# Patient Record
Sex: Male | Born: 1935 | Race: White | Hispanic: No | Marital: Married | Smoking: Former smoker
Health system: Southern US, Community
[De-identification: ages and names within clinical notes are randomized; demographics above are authoritative.]

## PROBLEM LIST (undated history)

## (undated) DIAGNOSIS — K219 Gastro-esophageal reflux disease without esophagitis: Secondary | ICD-10-CM

## (undated) DIAGNOSIS — I447 Left bundle-branch block, unspecified: Secondary | ICD-10-CM

## (undated) DIAGNOSIS — A498 Other bacterial infections of unspecified site: Secondary | ICD-10-CM

## (undated) DIAGNOSIS — Z87820 Personal history of traumatic brain injury: Secondary | ICD-10-CM

## (undated) DIAGNOSIS — G3184 Mild cognitive impairment, so stated: Secondary | ICD-10-CM

## (undated) DIAGNOSIS — Z95818 Presence of other cardiac implants and grafts: Secondary | ICD-10-CM

## (undated) DIAGNOSIS — R2689 Other abnormalities of gait and mobility: Secondary | ICD-10-CM

## (undated) DIAGNOSIS — E785 Hyperlipidemia, unspecified: Secondary | ICD-10-CM

## (undated) DIAGNOSIS — E7800 Pure hypercholesterolemia, unspecified: Secondary | ICD-10-CM

## (undated) DIAGNOSIS — D494 Neoplasm of unspecified behavior of bladder: Secondary | ICD-10-CM

## (undated) DIAGNOSIS — R9439 Abnormal result of other cardiovascular function study: Secondary | ICD-10-CM

## (undated) DIAGNOSIS — I499 Cardiac arrhythmia, unspecified: Secondary | ICD-10-CM

## (undated) DIAGNOSIS — H3530 Unspecified macular degeneration: Secondary | ICD-10-CM

## (undated) DIAGNOSIS — Z9049 Acquired absence of other specified parts of digestive tract: Secondary | ICD-10-CM

## (undated) DIAGNOSIS — M4802 Spinal stenosis, cervical region: Secondary | ICD-10-CM

## (undated) DIAGNOSIS — Z8711 Personal history of peptic ulcer disease: Secondary | ICD-10-CM

## (undated) HISTORY — DX: Acquired absence of other specified parts of digestive tract: Z90.49

## (undated) HISTORY — DX: Other abnormalities of gait and mobility: R26.89

## (undated) HISTORY — PX: TUMOR EXCISION: SHX421

## (undated) HISTORY — DX: Unspecified macular degeneration: H35.30

## (undated) HISTORY — DX: Mild cognitive impairment of uncertain or unknown etiology: G31.84

## (undated) HISTORY — DX: Pure hypercholesterolemia, unspecified: E78.00

## (undated) HISTORY — DX: Abnormal result of other cardiovascular function study: R94.39

## (undated) HISTORY — PX: NECK SURGERY: SHX720

## (undated) HISTORY — DX: Presence of other cardiac implants and grafts: Z95.818

## (undated) HISTORY — PX: TONSILLECTOMY: SUR1361

---

## 1968-09-23 HISTORY — PX: APPENDECTOMY: SHX54

## 1969-05-24 DIAGNOSIS — Z8711 Personal history of peptic ulcer disease: Secondary | ICD-10-CM

## 1969-05-24 HISTORY — DX: Personal history of peptic ulcer disease: Z87.11

## 1998-12-11 ENCOUNTER — Ambulatory Visit (HOSPITAL_COMMUNITY): Admission: RE | Admit: 1998-12-11 | Discharge: 1998-12-11 | Payer: Self-pay | Admitting: Cardiology

## 1998-12-11 ENCOUNTER — Encounter: Payer: Self-pay | Admitting: Cardiology

## 2002-07-12 ENCOUNTER — Encounter (HOSPITAL_BASED_OUTPATIENT_CLINIC_OR_DEPARTMENT_OTHER): Payer: Self-pay | Admitting: General Surgery

## 2002-07-14 ENCOUNTER — Ambulatory Visit (HOSPITAL_COMMUNITY): Admission: RE | Admit: 2002-07-14 | Discharge: 2002-07-14 | Payer: Self-pay | Admitting: General Surgery

## 2002-07-14 HISTORY — PX: INGUINAL HERNIA REPAIR: SUR1180

## 2002-07-17 ENCOUNTER — Emergency Department (HOSPITAL_COMMUNITY): Admission: EM | Admit: 2002-07-17 | Discharge: 2002-07-17 | Payer: Self-pay | Admitting: Emergency Medicine

## 2004-01-19 ENCOUNTER — Encounter (INDEPENDENT_AMBULATORY_CARE_PROVIDER_SITE_OTHER): Payer: Self-pay | Admitting: *Deleted

## 2004-01-19 ENCOUNTER — Ambulatory Visit (HOSPITAL_COMMUNITY): Admission: RE | Admit: 2004-01-19 | Discharge: 2004-01-19 | Payer: Self-pay | Admitting: Gastroenterology

## 2004-11-20 ENCOUNTER — Encounter: Admission: RE | Admit: 2004-11-20 | Discharge: 2004-11-20 | Payer: Self-pay | Admitting: Family Medicine

## 2005-05-15 ENCOUNTER — Emergency Department (HOSPITAL_COMMUNITY): Admission: EM | Admit: 2005-05-15 | Discharge: 2005-05-15 | Payer: Self-pay | Admitting: Emergency Medicine

## 2005-05-22 ENCOUNTER — Ambulatory Visit (HOSPITAL_COMMUNITY): Admission: RE | Admit: 2005-05-22 | Discharge: 2005-05-22 | Payer: Self-pay | Admitting: Family Medicine

## 2005-06-12 ENCOUNTER — Emergency Department (HOSPITAL_COMMUNITY): Admission: EM | Admit: 2005-06-12 | Discharge: 2005-06-12 | Payer: Self-pay | Admitting: Emergency Medicine

## 2005-12-22 ENCOUNTER — Observation Stay (HOSPITAL_COMMUNITY): Admission: EM | Admit: 2005-12-22 | Discharge: 2005-12-23 | Payer: Self-pay | Admitting: Emergency Medicine

## 2007-06-22 ENCOUNTER — Emergency Department (HOSPITAL_COMMUNITY): Admission: EM | Admit: 2007-06-22 | Discharge: 2007-06-22 | Payer: Self-pay | Admitting: Emergency Medicine

## 2008-02-11 ENCOUNTER — Encounter: Admission: RE | Admit: 2008-02-11 | Discharge: 2008-02-11 | Payer: Self-pay | Admitting: Family Medicine

## 2008-03-10 ENCOUNTER — Ambulatory Visit (HOSPITAL_BASED_OUTPATIENT_CLINIC_OR_DEPARTMENT_OTHER): Admission: RE | Admit: 2008-03-10 | Discharge: 2008-03-10 | Payer: Self-pay | Admitting: Urology

## 2008-03-10 ENCOUNTER — Encounter (INDEPENDENT_AMBULATORY_CARE_PROVIDER_SITE_OTHER): Payer: Self-pay | Admitting: Urology

## 2008-03-10 HISTORY — PX: TRANSURETHRAL RESECTION OF BLADDER TUMOR: SHX2575

## 2009-03-22 HISTORY — PX: CARDIOVASCULAR STRESS TEST: SHX262

## 2010-02-25 ENCOUNTER — Emergency Department (HOSPITAL_BASED_OUTPATIENT_CLINIC_OR_DEPARTMENT_OTHER): Admission: EM | Admit: 2010-02-25 | Discharge: 2010-02-25 | Payer: Self-pay | Admitting: Emergency Medicine

## 2010-09-28 DIAGNOSIS — K2100 Gastro-esophageal reflux disease with esophagitis, without bleeding: Secondary | ICD-10-CM

## 2010-09-28 HISTORY — DX: Gastro-esophageal reflux disease with esophagitis, without bleeding: K21.00

## 2011-02-05 NOTE — Op Note (Signed)
NAME:  Oscar [name], Oscar [name]               ACCOUNT NO.:  [id number]   MEDICAL RECORD NO.:  [id number]          PATIENT TYPE:  AMB   LOCATION:  NESC                         FACILITY:  [facility]   PHYSICIAN:  Valetta Fuller, M.D.  DATE OF BIRTH:  07/07/1931   DATE OF PROCEDURE:  DATE OF DISCHARGE:                               OPERATIVE REPORT   PREOPERATIVE DIAGNOSIS:  Bladder tumor.   POSTOPERATIVE DIAGNOSIS:  Bladder tumor.   PROCEDURE PERFORMED:  Transurethral resection of bladder tumor with deep  cold cup biopsies and fulguration.   SURGEON:  Valetta Fuller, M.D.   ANESTHESIA:  General.   INDICATIONS:  [name] is a 77 year old male.  The patient had some  intermittent painless gross hematuria.  The patient had an ultrasound of  his bladder and kidneys.  On bladder ultrasound there was an  approximately 2 cm bladder mass noted.  We performed cystoscopy in the  office and confirmed an approximately 2 cm tumor.  This abnormality  appeared to be on the right side of his bladder in the upper aspect of  the trigone above and lateral to the ureteral orifice.  The patient now  presents for endoscopic resection of the tumor and assessment.   TECHNIQUE AND FINDINGS:  The patient was brought to the operating room  where he had successful induction of general anesthesia.  He was placed  in lithotomy position and prepped and draped in the usual manner.  Careful cystoscopic assessment revealed a solitary tumor again superior  and lateral to the ureteral orifice about 3-4 cm away from that.  The  tumor had some adherent clot but for the most part was papillary in  appearance.  There were some surrounding red areas right along the area  of the tumor but no other abnormalities within the bladder.   TURBT was performed with continuous flow resectoscope.  The tumor was  resected down to the bladder wall.  No evidence of perforation was  noted.  The bladder tumor fragments were sent as a separate  pathologic  specimen.  We then put the cystoscope back in and used the cold cup to  take several deep biopsies in the underlying muscle.  We felt that would  be more prudent with less artifact than with the transurethral resection  loop.  We then used a Bugbee to further fulgurate the area.  Again, no  other abnormalities were noted and hemostasis was good.  The patient  appeared to tolerate the procedure well without obvious complications or  problems.  Foley catheter was inserted at the end of the procedure and  it drained clear urine.  The patient was brought to the recovery room in  stable condition.           ______________________________  Valetta Fuller, M.D.  Electronically Signed    DSG/MEDQ  D:  03/10/2008  T:  03/10/2008  Job:  [id number]

## 2011-02-08 NOTE — Discharge Summary (Signed)
NAME:  CARLISLE, ENKE               ACCOUNT NO.:  [id number]   MEDICAL RECORD NO.:  [id number]          PATIENT TYPE:  OBV   LOCATION:  5006                         FACILITY:  [facility]   PHYSICIAN:  Corinna L. Lendell Caprice, MDDATE OF BIRTH:  [birth year]   DATE OF ADMISSION:  12/21/2005  DATE OF DISCHARGE:  12/23/2005                                 DISCHARGE SUMMARY   DISCHARGE DIAGNOSES:  1.  Right-sided rib fractures and right L1 transverse process fractures.  2.  Gastroesophageal reflux disease.  3.  Hyperlipidemia.   DISCHARGE MEDICATIONS:  Percocet one to two p.o. q.4h. p.r.n. pain.   ACTIVITY:  No driving for a week or while on Percocet.   FOLLOW-UP:  Dr. Modesto Charon in about two weeks.   CONDITION:  Stable.   CONSULTATIONS:  None.   DIET:  Low cholesterol.   PROCEDURES:  None.   HISTORY AND HOSPITAL COURSE:  Mr. Mangas is a pleasant, quite active 77-  year-old white male patient of Dr. Modesto Charon who presented to the emergency room  after having tripped and fallen on a work bench.  He sustained rib fractures  involving the 10th, 11th, and 12th ribs and L1 transverse process fracture.  He had abdominal and pelvic CAT scan to rule out solid organ injury.  There  was no injury.  He did have some multiple benign or minimally complex cysts  of the liver.  His laboratories were unremarkable.  He was having a lot of  pain, nausea, and difficulty ambulating.  Therefore, he was placed on  observation for pain control and physical therapy to evaluate disposition.  Physical therapy feels he does not need placement at this time and can be  safely discharged home.  He is able to ambulate independently.  He did get  incentive spirometry and DVT prophylaxis while here.      Corinna L. Lendell Caprice, MD  Electronically Signed     CLS/MEDQ  D:  12/23/2005  T:  12/23/2005  Job:  [id number]   cc:   Thelma Barge P. Modesto Charon, M.D.  Fax: [phone]

## 2011-02-08 NOTE — Op Note (Signed)
Oscar [name], Oscar [name]                           ACCOUNT NO.:  [id number]   MEDICAL RECORD NO.:  [id number]                   PATIENT TYPE:  OIB   LOCATION:  2899                                 FACILITY:  [facility]   PHYSICIAN:  Leonie Man, MD                  DATE OF BIRTH:  [birth year]   DATE OF PROCEDURE:  07/14/2002  DATE OF DISCHARGE:                                 OPERATIVE REPORT   PREOPERATIVE DIAGNOSIS:  Right inguinal hernia.   POSTOPERATIVE DIAGNOSIS:  Right direct inguinal hernia.   PROCEDURE:  Right inguinal herniorrhaphy with mesh repair.   SURGEON:  Leonie Man, MD.   ASSISTANT:  Nurse.   ANESTHESIA:  MAC.  I used 0.5% Marcaine with epinephrine 1:200,000.   CLINICAL NOTE:  The patient is a 77 year old man presenting with a right-  sided groin bulge which has enlarged and prolapses easily.  He has not had  any symptoms of incarceration or bowel obstruction.  No history of bladder  neck obstruction, no history of chronic constipation.  He comes to the  operating room now for right inguinal hernia repair after the risks and  potential benefits of surgery have been fully discussed and he gives  consent.   DESCRIPTION OF PROCEDURE:  Following the induction of satisfactory sedation,  the patient's right groin was prepped and draped to be included in the  sterile operative field.  I infiltrated the right lower groin crease with  0.5% Marcaine with epinephrine and made a transverse incision in the lower  on the right, deepened this through the skin and subcutaneous tissue,  dissecting down to the external oblique aponeurosis.  The aponeurosis opened  up through the external inguinal ring with protection of the ilioinguinal  nerve, which was retracted inferiorly and laterally. The spermatic cord is  elevated and held with a Penrose drain, and a large direct hernia sac is  dissected free from the medial aspect of the cord.  A search for an indirect  sac was carried  out through the cord area, and no indirect sac was noted.  The hernia was repaired with an onlay patch of polypropylene mesh sewn to  the pubic tubercle with a 2-0 Novofil and carried up in a running suture  along the conjoined tendon to the internal ring, and again from the pubic  tubercle, along the shelving edge of Poupart's ligament, up to the internal  ring.  The spermatic cord was then returned to its normal anatomic position  with the mesh split and tacked down behind the cord so as to recreate the  internal ring.  Sponge, instrument, and sharp counts were verified and the  external oblique aponeurosis closed over the cord with a running 2-0 Vicryl  suture, Scarpa's fascia and subcutaneous tissue were closed with a running 3-  0 Vicryl suture, and the skin closed with a running 4-0 Monocryl suture and  reinforced with Steri-Strips.  Sterile dressings were applied.  The patient  was removed from the operating room to the recovery room in stable  condition.  He tolerated the procedure well.                                               Leonie Man, MD    PB/MEDQ  D:  07/14/2002  T:  07/14/2002  Job:  [id number]

## 2011-02-08 NOTE — H&P (Signed)
NAME:  Oscar [name], Oscar [name]               ACCOUNT NO.:  [id number]   MEDICAL RECORD NO.:  [id number]          PATIENT TYPE:  EMS   LOCATION:  MAJO                         FACILITY:  [facility]   PHYSICIAN:  Corinna L. Lendell Caprice, MDDATE OF BIRTH:  May 01, 1931   DATE OF ADMISSION:  12/21/2005  DATE OF DISCHARGE:                                HISTORY & PHYSICAL   CHIEF COMPLAINT:  Back pain.   HISTORY OF PRESENT ILLNESS:  Oscar [name] is a pleasant 77 year old white  male patient of Dr. Modesto Charon who presents to the emergency room via EMS after  having tripped and fallen.  He apparently hit his back on a work bench.  He  was diagnosed with rib fractures and apparently is unable to ambulate.  Therefore, I am asked to admit the patient.  Apparently, the trauma team was  consulted but declined admission but recommended primary care admission.  Patient reports that he feels a little nauseated after Dilaudid, but the  Percocet worked well.   PAST MEDICAL HISTORY:  1.  Hyperlipidemia.  2.  Gastroesophageal reflux disease.   MEDICATIONS:  Aspirin, Prilosec, Zocor.   No known drug allergies.   SOCIAL HISTORY:  Patient is married.  He drinks rarely.  He does not smoke.  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   FAMILY HISTORY:  His brother died with an MI and had diabetes.   REVIEW OF SYSTEMS:  As above, otherwise negative.   PHYSICAL EXAMINATION:  VITAL SIGNS:  Temperature 98.1, blood pressure  133/76, pulse 58, respiratory rate 20.  Oxygen saturation 99% on room air.  GENERAL:  Patient is well-developed and well-nourished in no acute distress.  HEENT:  Normocephalic and atraumatic.  Pupils are equal, round and reactive  to light.  Sclerae are anicteric.  Moist mucous membranes.  NECK:  Supple.  No carotid bruits.  LUNGS:  Clear to auscultation bilaterally without rales, rhonchi or wheezes.  CARDIOVASCULAR:  Regular rate and rhythm without murmurs, rubs or gallops.  ABDOMEN:  Normal  bowel sounds.  Nontender, nondistended.  BACK:  He has ecchymoses over his right flank and tenderness posteriorly  over the rib cage.  GU/RECTAL:  Deferred.  EXTREMITIES:  No clubbing, cyanosis or edema.  He does have an abrasion over  his left elbow.  NEUROLOGIC:  Alert and oriented.  Cranial nerves and sensory motor exam are  intact.   H&H normal.  BMET normal.   Right-sided rib films are negative.   CT of the abdomen and pelvis showed benign or minimally complex multiple low  density lesions in the liver.  No solid organ injury.  Parapelvic cysts in  the kidneys.  No free fluid, free air, adenopathy.  Bibasilar atelectasis.  Posterior right rib fractures involving the right 11th and 12th ribs.  Lateral rib fracture on the right involving ribs 10 and 11.  Right, left  transverse process fracture also noted.   ASSESSMENT/PLAN:  1.  Status post trip and fall with resulting right 10th, 11th, and 12th rib      fractures as well as L1 transverse process  fracture.  The patient is      elderly and reports that he cannot walk.  I will put him on 23-hour      observation for PT/OT, pain control, and care management consult.  I      believe he will be able to go back home, possibly with some durable      medical equipment.  2.  Gastroesophageal reflux disease.  3.  Hyperlipidemia.      Corinna L. Lendell Caprice, MD  Electronically Signed     CLS/MEDQ  D:  12/22/2005  T:  12/23/2005  Job:  [id number]   cc:   Thelma Barge P. Modesto Charon, M.D.  Fax: [phone]

## 2011-02-08 NOTE — Op Note (Signed)
NAMEDON, TIU                           ACCOUNT NO.:  [id number]   MEDICAL RECORD NO.:  [id number]                   PATIENT TYPE:  AMB   LOCATION:  ENDO                                 FACILITY:  [facility]   PHYSICIAN:  Danise Edge, M.D.                DATE OF BIRTH:  1931/07/30   DATE OF PROCEDURE:  01/19/2004  DATE OF DISCHARGE:                                 OPERATIVE REPORT   PROCEDURE:  Colonoscopy.   HISTORY:  Oscar [name] is a 77 year old male born September 25, 1928.  Mr.  [name] developed a probable viral gastroenteritis approximately three weeks  ago manifested by nausea, vomiting and nonbloody diarrhea. His CBC was  normal.  To evaluate the four quadrant abdominal discomfort associated with  his viral gastroenteritis type symptoms, he underwent a CT scan of the  abdomen and pelvis on December 06, 2003 which revealed multiple fluid density  nodules in the liver and kidneys, atherosclerosis, and degenerative changes  of the spine but no abnormality of the intestinal tract.  Oscar [name]  nausea and vomiting have resolved but he continues to have functional  nonbloody diarrhea.   MEDICATIONS:  No known drug allergies.   CURRENT MEDICATIONS:  Lipitor, Nexium, glucosamine.   PAST MEDICAL HISTORY:  Hypercholesterolemia, gastroesophageal reflux  disease, arthralgia's, right herniorrhaphy, appendectomy.   HABITS:  Oscar [name] does not smoke cigarettes and does not consume alcohol.   FAMILY HISTORY:  Negative for colon cancer.   ENDOSCOPIST:  Danise Edge, M.D.   PREMEDICATION:  Versed 7 mg, Demerol 70 mg.   DESCRIPTION OF PROCEDURE:  After obtaining informed consent, Oscar [name] was  placed in the left lateral decubitus position. I administered intravenous  Demerol and intravenous Versed to achieve conscious sedation for the  procedure. The patient's blood pressure, oxygen saturation and cardiac  rhythm were monitored throughout the procedure and documented in  the medical  record.   Anal inspection and digital rectal exam were normal.  The prostate was non-  nodular.  The Olympus adjustable pediatric colonoscope was introduced into  the rectum and advanced to the cecum. Colonic preparation for the exam today  was excellent.   RECTUM:  Normal.   SIGMOID COLON AND DESCENDING COLON:  Normal.   SPLENIC FLEXURE:  Normal.   TRANSVERSE COLON:  Normal.   HEPATIC FLEXURE:  Normal.   ASCENDING COLON:  Normal.   CECUM AND ILEOCECAL VALVE:  Normal.   BIOPSIES:  Three biopsies were taken from the right colon and three biopsies  were taken from the descending colon to rule out lymphocytic-collagenous  colitis.   ASSESSMENT:  Normal proctocolonoscopy to the cecum.  Random colonic biopsies  to rule out lymphocytic-collagenous colitis pending.  Danise Edge, M.D.    MJ/MEDQ  D:  01/19/2004  T:  01/19/2004  Job:  [id number]   cc:   Thelma Barge P. Modesto Charon, M.D.  [address]  [place]  [place] [address]  Fax: [phone]

## 2011-06-20 LAB — POCT HEMOGLOBIN-HEMACUE
Hemoglobin: 15.5
Operator id: 114531

## 2011-07-04 LAB — CBC
Hemoglobin: 14.5
MCHC: 33.8
MCV: 93.2
Platelets: 206
RBC: 4.61
RDW: 12.9
WBC: 5.7

## 2011-07-04 LAB — URINALYSIS, ROUTINE W REFLEX MICROSCOPIC
Bilirubin Urine: NEGATIVE
Hgb urine dipstick: NEGATIVE
Ketones, ur: 15 — AB
Nitrite: NEGATIVE
Protein, ur: NEGATIVE
Specific Gravity, Urine: 1.017
Urobilinogen, UA: 0.2
pH: 8.5 — ABNORMAL HIGH

## 2011-07-04 LAB — I-STAT 8, (EC8 V) (CONVERTED LAB)
BUN: 20
Chloride: 109
Glucose, Bld: 109 — ABNORMAL HIGH
Hemoglobin: 15.3
Potassium: 3.8
TCO2: 26
pCO2, Ven: 29.2 — ABNORMAL LOW

## 2011-07-04 LAB — DIFFERENTIAL
Basophils Relative: 0
Eosinophils Absolute: 0.2
Eosinophils Relative: 4
Monocytes Absolute: 0.5
Monocytes Relative: 9
Neutro Abs: 4

## 2011-07-04 LAB — POCT CARDIAC MARKERS
CKMB, poc: 3.6
Myoglobin, poc: 201
Operator id: 285841
Troponin i, poc: <0.05

## 2011-07-04 LAB — POCT I-STAT CREATININE: Operator id: 285841

## 2011-09-27 DIAGNOSIS — T50Z95A Adverse effect of other vaccines and biological substances, initial encounter: Secondary | ICD-10-CM

## 2011-09-27 DIAGNOSIS — T8062XA Other serum reaction due to vaccination, initial encounter: Secondary | ICD-10-CM

## 2011-09-27 HISTORY — DX: Adverse effect of other vaccines and biological substances, initial encounter: T50.Z95A

## 2011-09-27 HISTORY — DX: Other serum reaction due to vaccination, initial encounter: T80.62XA

## 2011-12-09 ENCOUNTER — Other Ambulatory Visit: Payer: Self-pay | Admitting: Surgery

## 2013-02-05 ENCOUNTER — Encounter (HOSPITAL_BASED_OUTPATIENT_CLINIC_OR_DEPARTMENT_OTHER): Payer: Self-pay | Admitting: *Deleted

## 2013-02-05 ENCOUNTER — Other Ambulatory Visit: Payer: Self-pay | Admitting: Urology

## 2013-02-05 NOTE — Progress Notes (Signed)
NPO AFTER MN. ARRIVES AT 0915. NEEDS HG. WILL TAKE PRILOSEC AM OF SURG W/ SIP OF WATER.

## 2013-02-10 ENCOUNTER — Ambulatory Visit (HOSPITAL_BASED_OUTPATIENT_CLINIC_OR_DEPARTMENT_OTHER): Payer: Medicare Other | Admitting: Anesthesiology

## 2013-02-10 ENCOUNTER — Encounter (HOSPITAL_BASED_OUTPATIENT_CLINIC_OR_DEPARTMENT_OTHER): Payer: Self-pay | Admitting: Anesthesiology

## 2013-02-10 ENCOUNTER — Encounter (HOSPITAL_BASED_OUTPATIENT_CLINIC_OR_DEPARTMENT_OTHER)
Admission: RE | Disposition: A | Discharge status: Home / Self Care | Payer: Self-pay | Source: Ambulatory Visit | Attending: Urology

## 2013-02-10 ENCOUNTER — Ambulatory Visit (HOSPITAL_BASED_OUTPATIENT_CLINIC_OR_DEPARTMENT_OTHER)
Admission: RE | Admit: 2013-02-10 | Discharge: 2013-02-10 | Disposition: A | Discharge status: Home / Self Care | Payer: Medicare Other | Source: Ambulatory Visit | Attending: Urology | Admitting: Urology

## 2013-02-10 ENCOUNTER — Encounter (HOSPITAL_BASED_OUTPATIENT_CLINIC_OR_DEPARTMENT_OTHER): Payer: Self-pay

## 2013-02-10 DIAGNOSIS — N401 Enlarged prostate with lower urinary tract symptoms: Secondary | ICD-10-CM | POA: Insufficient documentation

## 2013-02-10 DIAGNOSIS — C679 Malignant neoplasm of bladder, unspecified: Secondary | ICD-10-CM

## 2013-02-10 DIAGNOSIS — Z87891 Personal history of nicotine dependence: Secondary | ICD-10-CM | POA: Insufficient documentation

## 2013-02-10 DIAGNOSIS — Z79899 Other long term (current) drug therapy: Secondary | ICD-10-CM | POA: Insufficient documentation

## 2013-02-10 DIAGNOSIS — C674 Malignant neoplasm of posterior wall of bladder: Secondary | ICD-10-CM | POA: Insufficient documentation

## 2013-02-10 DIAGNOSIS — N138 Other obstructive and reflux uropathy: Secondary | ICD-10-CM | POA: Insufficient documentation

## 2013-02-10 DIAGNOSIS — N139 Obstructive and reflux uropathy, unspecified: Secondary | ICD-10-CM | POA: Insufficient documentation

## 2013-02-10 DIAGNOSIS — Z7982 Long term (current) use of aspirin: Secondary | ICD-10-CM | POA: Insufficient documentation

## 2013-02-10 DIAGNOSIS — K219 Gastro-esophageal reflux disease without esophagitis: Secondary | ICD-10-CM | POA: Insufficient documentation

## 2013-02-10 HISTORY — DX: Gastro-esophageal reflux disease without esophagitis: K21.9

## 2013-02-10 HISTORY — DX: Personal history of traumatic brain injury: Z87.820

## 2013-02-10 HISTORY — DX: Personal history of peptic ulcer disease: Z87.11

## 2013-02-10 HISTORY — DX: Neoplasm of unspecified behavior of bladder: D49.4

## 2013-02-10 HISTORY — DX: Malignant neoplasm of bladder, unspecified: C67.9

## 2013-02-10 HISTORY — DX: Hyperlipidemia, unspecified: E78.5

## 2013-02-10 HISTORY — PX: CYSTOSCOPY WITH BIOPSY: SHX5122

## 2013-02-10 SURGERY — CYSTOSCOPY, WITH BIOPSY
Anesthesia: General | Site: Bladder | Wound class: Clean Contaminated

## 2013-02-10 MED ORDER — LIDOCAINE HCL (CARDIAC) 20 MG/ML IV SOLN
INTRAVENOUS | Status: DC | PRN
  Administered 2013-02-10: 50 mg via INTRAVENOUS

## 2013-02-10 MED ORDER — LACTATED RINGERS IV SOLN
INTRAVENOUS | Status: DC
  Administered 2013-02-10: 10:00:00 via INTRAVENOUS
  Filled 2013-02-10: qty 1000

## 2013-02-10 MED ORDER — FENTANYL CITRATE 0.05 MG/ML IJ SOLN
INTRAMUSCULAR | Status: DC | PRN
  Administered 2013-02-10: 25 ug via INTRAVENOUS
  Administered 2013-02-10: 50 ug via INTRAVENOUS

## 2013-02-10 MED ORDER — PROPOFOL 10 MG/ML IV BOLUS
INTRAVENOUS | Status: DC | PRN
  Administered 2013-02-10: 100 mg via INTRAVENOUS

## 2013-02-10 MED ORDER — DEXAMETHASONE SODIUM PHOSPHATE 4 MG/ML IJ SOLN
INTRAMUSCULAR | Status: DC | PRN
  Administered 2013-02-10: 4 mg via INTRAVENOUS

## 2013-02-10 MED ORDER — STERILE WATER FOR IRRIGATION IR SOLN
Status: DC | PRN
  Administered 2013-02-10: 3000 mL

## 2013-02-10 MED ORDER — LIDOCAINE HCL 2 % EX GEL
CUTANEOUS | Status: DC | PRN
  Administered 2013-02-10: 1 via URETHRAL

## 2013-02-10 MED ORDER — CIPROFLOXACIN HCL 250 MG PO TABS
250.0000 mg | ORAL_TABLET | Freq: Once | ORAL | Status: AC
  Administered 2013-02-10: 250 mg via ORAL
  Filled 2013-02-10: qty 1

## 2013-02-10 MED ORDER — ONDANSETRON HCL 4 MG/2ML IJ SOLN
INTRAMUSCULAR | Status: DC | PRN
  Administered 2013-02-10: 4 mg via INTRAVENOUS

## 2013-02-10 SURGICAL SUPPLY — 21 items
BAG DRAIN URO-CYSTO SKYTR STRL (DRAIN) ×2 IMPLANT
BAG DRN UROCATH (DRAIN) ×1
CANISTER SUCT LVC 12 LTR MEDI- (MISCELLANEOUS) ×1 IMPLANT
CATH ROBINSON RED A/P 14FR (CATHETERS) IMPLANT
CATH ROBINSON RED A/P 16FR (CATHETERS) IMPLANT
CLOTH BEACON ORANGE TIMEOUT ST (SAFETY) ×2 IMPLANT
DRAPE CAMERA CLOSED 9X96 (DRAPES) ×2 IMPLANT
ELECT REM PT RETURN 9FT ADLT (ELECTROSURGICAL) ×2
ELECTRODE REM PT RTRN 9FT ADLT (ELECTROSURGICAL) ×1 IMPLANT
GLOVE BIO SURGEON STRL SZ7 (GLOVE) ×1 IMPLANT
GLOVE BIO SURGEON STRL SZ7.5 (GLOVE) ×2 IMPLANT
GLOVE INDICATOR 7.5 STRL GRN (GLOVE) ×1 IMPLANT
GOWN STRL REIN XL XLG (GOWN DISPOSABLE) ×3 IMPLANT
NDL SAFETY ECLIPSE 18X1.5 (NEEDLE) IMPLANT
NEEDLE HYPO 18GX1.5 SHARP (NEEDLE)
NEEDLE HYPO 22GX1.5 SAFETY (NEEDLE) IMPLANT
NS IRRIG 500ML POUR BTL (IV SOLUTION) IMPLANT
PACK CYSTOSCOPY (CUSTOM PROCEDURE TRAY) ×2 IMPLANT
SYR 20CC LL (SYRINGE) IMPLANT
SYR BULB IRRIGATION 50ML (SYRINGE) IMPLANT
WATER STERILE IRR 3000ML UROMA (IV SOLUTION) ×2 IMPLANT

## 2013-02-10 NOTE — Anesthesia Procedure Notes (Signed)
Procedure Name: LMA Insertion Date/Time: 02/10/2013 10:27 AM Performed by: Maris Berger T Pre-anesthesia Checklist: Patient identified, Emergency Drugs available, Suction available and Patient being monitored Patient Re-evaluated:Patient Re-evaluated prior to inductionOxygen Delivery Method: Circle System Utilized Preoxygenation: Pre-oxygenation with 100% oxygen Intubation Type: IV induction Ventilation: Mask ventilation without difficulty LMA: LMA inserted LMA Size: 5.0 Number of attempts: 1 Placement Confirmation: positive ETCO2 Dental Injury: Teeth and Oropharynx as per pre-operative assessment  Comments: Gauze roll between teeth

## 2013-02-10 NOTE — Anesthesia Postprocedure Evaluation (Signed)
  Anesthesia Post-op Note  Patient: Oscar [name]  Procedure(s) Performed: Procedure(s) (LRB): CYSTOSCOPY WITH COLD CUP BIOPSY/FULGERATION (N/A)  Patient Location: PACU  Anesthesia Type: General  Level of Consciousness: awake and alert   Airway and Oxygen Therapy: Patient Spontanous Breathing  Post-op Pain: mild  Post-op Assessment: Post-op Vital signs reviewed, Patient's Cardiovascular Status Stable, Respiratory Function Stable, Patent Airway and No signs of Nausea or vomiting  Last Vitals:  Filed Vitals:   02/10/13 1115  BP: 144/80  Pulse: 54  Temp:   Resp: 13    Post-op Vital Signs: stable   Complications: No apparent anesthesia complications

## 2013-02-10 NOTE — Transfer of Care (Signed)
Immediate Anesthesia Transfer of Care Note  Patient: Oscar [name]  Procedure(s) Performed: Procedure(s) with comments: CYSTOSCOPY WITH COLD CUP BIOPSY/FULGERATION (N/A) - ALSO FULGERATION   Patient Location: PACU  Anesthesia Type:General  Level of Consciousness: awake, alert  and oriented  Airway & Oxygen Therapy: Patient Spontanous Breathing and Patient connected to nasal cannula oxygen  Post-op Assessment: Report given to PACU RN  Post vital signs: Reviewed and stable  Complications: No apparent anesthesia complications

## 2013-02-10 NOTE — Op Note (Signed)
Preoperative diagnosis: Recurrent transitional cell carcinoma the bladder Postoperative diagnosis: Same  Procedure: Cold cup resection of bladder tumor with additional biopsy and fulguration   Surgeon: Valetta Fuller M.D.  Anesthesia: Gen.  Indications: Oscar [name] had transitional cell carcinoma bladder. During recent followup cystoscopy was noted to have a papillary tumor on the posterior wall of his bladder. There were 2 adjacent areas of erythema. He presents now for cold cup resection of the tumor along with biopsy of the erythematous areas to rule out carcinoma in situ.     Technique and findings: Patient was brought the operating room where he had successful induction general anesthesia. He was placed in lithotomy position and prepped and draped in usual manner. Appropriate surgical timeout was performed. Cystoscopy revealed moderate trilobar hyperplasia. The bladder was carefully panendoscoped. A 1-1/2 cm papillary tumor was noted on the posterior wall. This was cold cup second. 2 adjacent areas of erythema underwent biopsy sampling. All areas were then fulgurated. The patient no obvious complications or problems. Hemostasis was excellent he was brought to recovery room in stable condition having had no obvious complications.

## 2013-02-10 NOTE — H&P (Signed)
Reason For Visit                Here for bladder tumor follow up/ cystoscopy. Urine clear. No clinical change.     No change in his medical status. He has no new concerns.      History of Present Illness                Past Gu Hx:             Prior urologic history: The patient had developed some gross hematuria and office cystoscopy confirmed the presence of a bladder mass. TURBT was done in June 2009.  The patient's pathology revealed no evidence of invasion. Tumor was Ta. Deeper biopsies showed no evidence of tumor. The tumor, however, was high grade. The tumor size was approximately 2 cm. 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 Follow up in 10/ 2009 was negative with cystoscopy and NMP-22 testing.  In January of 2010 a cystoscopy was negative.  His cytology however showed some atypia.  For that reason in early March of 2010,  we repeated his cytology which was completely benign.  We also did FISH, which was negative, which was very encouraging. Follow up in 8/ 2010 with negative cysto and formal cytology.  Last follow up was 3/ 2011 with negative ctstoscopy and NMP-22. The urine remains crystal clear.  He has very minimal outlet obstructive symptoms, but nothing problematic and he continues to do well clinically. Remote tobacco use none x 25 years.     05/2010: Negative cystoscopy and NMP-22 11/2010: He was noted to have small papillary recurrence at old resection site. 12/2010: Office fulgeration of small recurrence.  04/2011: Negative cystoscopy and NMP-22  08/2011: Negative cystoscopy and NMP-22  12/2011: Negative cystoscopy with atypia noted on cytology.  07/2012: Negative cystoscopy and NMP-22   Past Medical History Problems  1. History of  Arthritis V13.4 2. History of  Gastric Ulcer 531.90 3. History of  Heartburn 787.1 4. History of  Murmurs 785.2  Surgical History Problems  1. History of  Appendectomy 2. History of   Cystoscopy With Fulguration Medium Lesion (2-5cm) 3. History of  Inguinal Hernia Repair Right  Current Meds 1. Aleve CAPS; AS NEEDED; Therapy: (Recorded:24Apr2012) to 2. Aspirin 81 MG Oral Tablet; 1 per day; Therapy: (Recorded:19Mar2012) to 3. Glucosamine CAPS; 1200; 1 twice daily; Therapy: (Recorded:19Mar2012) to 4. Omeprazole 20 MG Oral Capsule Delayed Release; 1 per day; Therapy: (Recorded:19Mar2012)  to 5. Simvastatin TABS; 20mg ; 1 every other day; Therapy: (Recorded:19Mar2012) to  Allergies Medication  1. No Known Drug Allergies  Family History Problems  1. Fraternal history of  Acute Myocardial Infarction V17.3 2. Paternal history of  Aortic Aneurysm 3. Fraternal history of  Diabetes Mellitus V18.0 4. Family history of  Family Health Status Number Of Children 1 son and 3 daughters 5. Family history of  Father Deceased At Age ____ 18yrs, 6. Family history of  Mother Deceased At Age ____ 55yrs, natural causes  Social History Problems  1. Alcohol Use rarely 2. Caffeine Use 1-3 qd 3. Former Smoker 1 ppd for 62yrs, nonsmoker for the past 11yrs 4. Marital History - Currently Married 5. Occupation: retired  Review of Systems Genitourinary, constitutional, skin, eye, otolaryngeal, hematologic/lymphatic, cardiovascular, pulmonary, endocrine, musculoskeletal, gastrointestinal, neurological and psychiatric system(s) were reviewed and pertinent findings if present are noted.  Genitourinary: urinary frequency and nocturia, but no dysuria, urine stream is not weak and no hematuria.  Gastrointestinal: no abdominal pain.  Hematologic/Lymphatic: a tendency to easily bruise.    Vitals Vital Signs [Data Includes: Last 1 Day]  09May2014 02:20PM  Blood Pressure: 121 / 75 Temperature: 98 F Heart Rate: 60  Physical Exam Constitutional: Well nourished Amended By: Barron Alvine; 01/29/2013 3:06 PMEST  and well developed Amended By: Barron Alvine; 01/29/2013 3:06 PMEST . No acute  distress. Amended By: Barron Alvine; 01/29/2013 3:06 PMEST.  Pulmonary: No respiratory distress Amended By: Barron Alvine; 01/29/2013 3:06 PMEST  and normal respiratory rhythm and effort Amended By: Barron Alvine; 01/29/2013 3:06 PMEST.  Cardiovascular: Heart rate and rhythm are normal Amended By: Barron Alvine; 01/29/2013 3:06 PMEST . No peripheral edema. Amended By: Barron Alvine; 01/29/2013 3:06 PMEST.  Abdomen: The abdomen is soft and nontender Amended By: Barron Alvine; 01/29/2013 3:06 PMEST No masses are palpated Amended By: Barron Alvine; 01/29/2013 3:06 PMEST No CVA tenderness Amended By: Barron Alvine; 01/29/2013 3:06 PMEST. No hernias are palpable Amended By: Barron Alvine; 01/29/2013 3:06 PMEST No hepatosplenomegaly noted Amended By: Barron Alvine; 01/29/2013 3:06 PMEST  Skin: Normal skin turgor Amended By: Barron Alvine; 01/29/2013 3:06 PMEST , no visible rash Amended By: Barron Alvine; 01/29/2013 3:06 PMEST  and no visible skin lesions Amended By: Barron Alvine; 01/29/2013 3:06 PMEST.  Neuro/Psych:. Mood and affect are appropriate. Amended By: Barron Alvine; 01/29/2013 3:06 PMEST.    Results/Data Urine [Data Includes: Last 1 Day]   09May2014 COLOR YELLOW  APPEARANCE CLEAR  SPECIFIC GRAVITY 1.030  pH 6.0  GLUCOSE NEG mg/dL BILIRUBIN NEG  KETONE NEG mg/dL BLOOD NEG  PROTEIN NEG mg/dL UROBILINOGEN 0.2 mg/dL NITRITE NEG  LEUKOCYTE ESTERASE NEG   Procedure  Procedure: Cystoscopy  Chaperone Present: brandy.  Indication: History of Urothelial Carcinoma.  Informed Consent: Risks, benefits, and potential adverse events were discussed and informed consent was obtained. Specific risks including, but not limited to bleeding, infection, pain, allergic reaction etc. were explained.  Prep: The patient was prepped with betadine.  Anesthesia:. Local anesthesia was administered intraurethrally with 2% lidocaine jelly.  Antibiotic prophylaxis: Ciprofloxacin.  Procedure Note:  Urethral  meatus:. No abnormalities.  Anterior urethra: No abnormalities.  Prostatic urethra:. The lateral and median prostatic lobes were enlarged.  Bladder: Visulization was clear. The ureteral orifices were in the normal anatomic position bilaterally. Examination of the bladder demonstrated mild trabeculation and a diverticulum located near the dome of the bladder erythematous mucosa located at the base of the bladder. A solitary tumor was visualized in the bladder. A papillary tumor was seen in the bladder measuring approximately 0.5 cm in size. The patient tolerated the procedure well.  Complications: None.    Assessment Assessed  1. Bladder Cancer 188.9 2. Benign Prostatic Hypertrophy With Urinary Obstruction 600.01  Plan Health Maintenance (V70.0)  1. UA With REFLEX  Done: 09May2014 02:07PM  Discussion/Summary  Oscar [name] continues to do well clinically. Cystoscopically today he does however have a areas increased erythema at the bladder base and the junction of the posterior wall. There also appears to be a very small 4-5 mm area of probable papillary tumor recurrence. I do think we should biopsy that area with fulguration. We really want to rule out any carcinoma in situ. There is no urgency for this.

## 2013-02-10 NOTE — Anesthesia Preprocedure Evaluation (Signed)
Anesthesia Evaluation  Patient identified by MRN, date of birth, ID band Patient awake    Reviewed: Allergy & Precautions, H&P , NPO status , Patient's Chart, lab work & pertinent test results  Airway Mallampati: II TM Distance: >3 FB Neck ROM: Full    Dental no notable dental hx.    Pulmonary former smoker,  breath sounds clear to auscultation  Pulmonary exam normal       Cardiovascular Exercise Tolerance: Good negative cardio ROS  Rhythm:Regular Rate:Normal     Neuro/Psych negative neurological ROS  negative psych ROS   GI/Hepatic Neg liver ROS, GERD-  Medicated,  Endo/Other  negative endocrine ROS  Renal/GU negative Renal ROS  negative genitourinary   Musculoskeletal negative musculoskeletal ROS (+)   Abdominal   Peds negative pediatric ROS (+)  Hematology negative hematology ROS (+)   Anesthesia Other Findings   Reproductive/Obstetrics negative OB ROS                           Anesthesia Physical Anesthesia Plan  ASA: II  Anesthesia Plan: General   Post-op Pain Management:    Induction: Intravenous  Airway Management Planned: LMA  Additional Equipment:   Intra-op Plan:   Post-operative Plan: Extubation in OR  Informed Consent: I have reviewed the patients History and Physical, chart, labs and discussed the procedure including the risks, benefits and alternatives for the proposed anesthesia with the patient or authorized representative who has indicated his/her understanding and acceptance.   Dental advisory given  Plan Discussed with: CRNA  Anesthesia Plan Comments:         Anesthesia Quick Evaluation

## 2013-02-16 ENCOUNTER — Encounter (HOSPITAL_BASED_OUTPATIENT_CLINIC_OR_DEPARTMENT_OTHER): Payer: Self-pay | Admitting: Urology

## 2013-06-07 DIAGNOSIS — Z6826 Body mass index (BMI) 26.0-26.9, adult: Secondary | ICD-10-CM

## 2013-06-07 HISTORY — DX: Body mass index (BMI) 26.0-26.9, adult: Z68.26

## 2013-11-05 ENCOUNTER — Encounter (INDEPENDENT_AMBULATORY_CARE_PROVIDER_SITE_OTHER): Payer: Self-pay

## 2013-11-05 ENCOUNTER — Encounter: Payer: Self-pay | Admitting: Neurology

## 2013-11-05 ENCOUNTER — Ambulatory Visit (INDEPENDENT_AMBULATORY_CARE_PROVIDER_SITE_OTHER): Payer: Medicare Other | Admitting: Neurology

## 2013-11-05 VITALS — BP 130/78 | HR 59 | Temp 96.6°F | Ht 66.5 in | Wt 170.0 lb

## 2013-11-05 DIAGNOSIS — G4733 Obstructive sleep apnea (adult) (pediatric): Secondary | ICD-10-CM

## 2013-11-05 DIAGNOSIS — R42 Dizziness and giddiness: Secondary | ICD-10-CM

## 2013-11-05 DIAGNOSIS — R52 Pain, unspecified: Secondary | ICD-10-CM

## 2013-11-05 DIAGNOSIS — H532 Diplopia: Secondary | ICD-10-CM

## 2013-11-05 NOTE — Progress Notes (Signed)
Subjective:    [name] ID: Oscar [name] [name] is a 80 y.o. male.  HPI    Star Age, MD, PhD [facility] [address], [address].O. [place], [place] [address]  Dear Kieth Brightly,   I saw your [name], Oscar [name] [name], upon your kind request in my neurologic clinic today for initial consultation of Oscar [name] double vision and dizziness. Oscar [name] [name] is accompanied by Oscar [name] [name] today. As you know, Oscar [name] [name] is a very pleasant 80 year old right-handed woman with an underlying medical history of bladder cancer, hearing loss with tinnitus, reflux esophagitis, hyperlipidemia, obesity, former smoking, who has had intermittent dizziness for Oscar [name] past several years. He was diagnosed with vertigo in Oscar [name] past. In 2008 he had a head CT which was appropriate for age and negative for any acute findings at Oscar [name] time. He has had vertical diplopia off and on for Oscar [name] past 2 weeks. Blood work from 11/01/2013 has included ESR of 12, and Oscar [name] TSH was mildly elevated at 5.74. He had normal CMP. I reviewed Oscar [name] brain MRI report from 11/01/2013, which was done with and without contrast: This showed no acute intracranial abnormality, stable MRI appearance of Oscar [name] brain since 2008, except for progressed nonspecific white matter signal changes, most commonly due to chronic small vessel disease. He has had intermittent diplopia and has lasted for an hour and occurs more than once per day and lately, in Oscar [name] last few days only for a few seconds. He has had no Sx today and yesterday. He had a mild HA before. Never had TIA or stroke symptoms, denying sudden onset of one sided weakness, numbness, tingling, slurring of speech or droopy face, hearing loss, or visual field cut or monocular loss of vision, and denies recurrent headaches. He has had tinnitus for years. He feels Oscar [name] dizziness is not a big player, sometimes, he feels off balance sometimes. Oscar [name] daughter reported an episode of slurring of speech in December, but  neither he nor Oscar [name] [name] recall that.  Oscar [name] diplopia is described as vertical and binocular only. He has recently received new prescription eyeglasses and had some difficulty adjusting to them but he has been able to use them consistently. He has no dizziness or diplopia currently, but does not "feel so good" and cannot elaborate. Oscar [name] [name] wonders, if he is depressed. There are quite a few stressors: Oscar [name] mother in-law is 66 yo and lives with them, and there is tension, they have a 78 yo adoptive daughter since birth. They have had a total of 42 foster children over Oscar [name] course of years. They adopted one and also take care of two 78 yo twins. Oscar [name] 80 yo has had some mood issues, including cutting, and had to be inpatient recently.  Of note, he snores and has breathing pauses. Snoring is loud, per [name]. He falls asleep in Oscar [name] den about 8 PM and goes to bed at 10 PM, then starts Oscar [name] day between 4-5 AM and feels marginally rested. He naps once during Oscar [name] day, but sometimes in Oscar [name] mid-morning. He goes to Oscar [name] bathroom 1-2 per night.   Oscar [name] Past Medical History Is Significant For: Past Medical History  Diagnosis Date  . Bladder tumor   . GERD (gastroesophageal reflux disease)   . Hyperlipidemia   . History of peptic ulcer 1970'S  . History of concussion X2   NO RESIDUALS    Oscar [name] Past Surgical History Is Significant For: Past Surgical History  Procedure Laterality Date  . Transurethral resection of  bladder tumor  03-10-2008  . Inguinal hernia repair Right 07-14-2002  . Appendectomy  1970  . Cystoscopy with biopsy N/A 02/10/2013    Procedure: CYSTOSCOPY WITH COLD CUP BIOPSY/FULGERATION;  Surgeon: Bernestine Amass, MD;  Location: [facility];  Service: Urology;  Laterality: N/A;  ALSO FULGERATION     Oscar [name] Family History Is Significant For: No family history on file.  Oscar [name] Social History Is Significant For: History   Social History  . Marital Status: Married    Spouse Name: N/A    Number  of Children: N/A  . Years of Education: N/A   Social History Main Topics  . Smoking status: Former Smoker -- 0.50 packs/day for 30 years    Types: Cigarettes    Quit date: 02/06/1979  . Smokeless tobacco: Never Used  . Alcohol Use: No  . Drug Use: No  . Sexual Activity: None   Other Topics Concern  . None   Social History Narrative  . None    Oscar [name] Allergies Are:  No Known Allergies:   Oscar [name] Current Medications Are:  Outpatient Encounter Prescriptions as of 11/05/2013  Medication Sig  . Glucos-Chondroit-Hyaluron-MSM (GLUCOSAMINE CHONDROITIN JOINT PO) Take by mouth daily.  Marland Kitchen omeprazole (PRILOSEC) 20 MG capsule Take 20 mg by mouth every morning.  . simvastatin (ZOCOR) 20 MG tablet Take 20 mg by mouth every evening.   Review of Systems:  Out of a complete 14 point review of systems, all are reviewed and negative with Oscar [name] exception of these symptoms as listed below:   Review of Systems  Constitutional: Negative.   HENT: Positive for tinnitus.   Eyes: Positive for visual disturbance (Diplopia, blurred).  Respiratory: Negative.   Cardiovascular: Negative.   Gastrointestinal: Negative.   Endocrine: Negative.   Genitourinary: Negative.   Musculoskeletal: Negative.   Skin: Negative.   Allergic/Immunologic: Negative.   Neurological: Positive for dizziness.  Hematological: Negative.   Psychiatric/Behavioral: Negative.     Objective:  Neurologic Exam  Physical Exam Physical Examination:   Filed Vitals:   11/05/13 0951  BP: 130/78  Pulse: 59  Temp: 96.6 F (35.9 C)   General Examination: Oscar [name] [name] is a very pleasant 80 y.o. male in no acute distress. He appears well-developed and well-nourished and well groomed.   HEENT: Normocephalic, atraumatic, pupils are equal, round and reactive to light and accommodation. Funduscopic exam is normal with sharp disc margins noted. Extraocular tracking is good without limitation to gaze excursion or nystagmus noted. Normal smooth  pursuit is noted. Hearing is grossly intact. Tympanic membranes are clear bilaterally. Face is symmetric with normal facial animation and normal facial sensation. Speech is clear with no dysarthria noted. There is no hypophonia. There is no lip, neck/head, jaw or voice tremor. Neck is supple with full range of passive and active motion. There are no carotid bruits on auscultation. Oropharynx exam reveals: mild mouth dryness, adequate dental hygiene and mild airway crowding, due to larger tongue and elongated uvula and redundant soft palate. Mallampati is class II. Tongue protrudes centrally and palate elevates symmetrically. Tonsils are absent. Neck size is 14.5 inches.   Chest: Clear to auscultation without wheezing, rhonchi or crackles noted.  Heart: S1+S2+0, regular and normal without murmurs, rubs or gallops noted.   Abdomen: Soft, non-tender and non-distended with normal bowel sounds appreciated on auscultation.  Extremities: There is no pitting edema in Oscar [name] distal lower extremities bilaterally. Pedal pulses are intact.  Skin: Warm and dry without trophic changes noted. There are no  varicose veins.  Musculoskeletal: exam reveals no obvious joint deformities, tenderness or joint swelling or erythema.   Neurologically:  Mental status: Oscar [name] [name] is awake, alert and oriented in all 4 spheres. Oscar [name] immediate and remote memory, attention, language skills and fund of knowledge are appropriate. There is no evidence of aphasia, agnosia, apraxia or anomia. Speech is clear with normal prosody and enunciation. Thought process is linear. Mood is constricted and affect is blunted.  Cranial nerves II - XII are as described above under HEENT exam. In addition: shoulder shrug is normal with equal shoulder height noted. Motor exam: Normal bulk, strength and tone is noted. There is no drift, tremor or rebound. Romberg is negative. Reflexes are 2+ throughout. Babinski: Toes are flexor bilaterally. Fine motor  skills and coordination: intact with normal finger taps, normal hand movements, normal rapid alternating patting, normal foot taps and normal foot agility.  Cerebellar testing: No dysmetria or intention tremor on finger to nose testing. Heel to shin is unremarkable bilaterally. There is no truncal or gait ataxia.  Sensory exam: intact to light touch, pinprick, vibration, temperature sense and proprioception in Oscar [name] upper and lower extremities.  Gait, station and balance: He stands easily. No veering to one side is noted. No leaning to one side is noted. Posture is age-appropriate and stance is narrow based. Gait shows normal stride length and normal pace. No problems turning are noted. He turns en bloc. Tandem walk is very difficult for him. Intact toe and heel stance is noted.              ' Assessment and Plan:   In summary, DEEPAK BLESS is a very pleasant 80 y.o.-year old male with a history of intermittent vertical diplopia which started recently and a longer standing history of vertigo and intermittent dizziness. Oscar [name] physical exam is nonfocal. Oscar [name] history and physical exam it is concerning however for prescriptive sleep apnea. As far as Oscar [name] eye exam goes, I advised him to get Oscar [name] eyes checked by Oscar [name] ophthalmologist and he is actually due for a checkup. As far as Oscar [name] dizziness is concerned, he knows he has a history of vertigo and he has had milder intermittent dizziness. He is advised to change positions slowly and stay well-hydrated. He has had a brain MRI recently which I reviewed and I do not think he needs further workup for this from my end of things at this time, especially in light of Oscar [name] nonfocal neurological exam. Nevertheless, would like to offer him a sleep study for concern for underlying obstructive sleep apnea. Especially because he has some vague complaint of not feeling well and Oscar [name] [name] has noticed some mood changes, I explained to him that poor sleep can cause mood related issues as  well as more vague constitutional complaints. To that end, he is willing to come back for a sleep study. We talked about Oscar [name] potential diagnosis of OSA, its prognosis and treatment options. I explained in particular Oscar [name] risks and ramifications of untreated moderate to severe OSA, especially with respect to developing cardiovascular disease down Oscar [name] Road, including congestive heart failure, difficult to treat hypertension, cardiac arrhythmias, or stroke. Even type 2 diabetes has in part been linked to untreated OSA. We talked about trying to maintain a healthy lifestyle in general, as well as Oscar [name] importance of weight control. I encouraged Oscar [name] [name] to eat healthy, exercise daily and keep well hydrated, to keep a scheduled bedtime and wake time routine, to not skip any meals and  eat healthy snacks in between meals.  I recommended Oscar [name] following at this time: sleep study with potential positive airway pressure titration.  I explained Oscar [name] sleep test procedure to Oscar [name] [name] and also outlined possible surgical and non-surgical treatment options of OSA, including Oscar [name] use of a custom-made dental device, upper airway surgical options, such as pillar implants, radiofrequency surgery, tongue base surgery, and UPPP. I also explained Oscar [name] CPAP treatment option to Oscar [name] [name], who indicated that he would be willing to try CPAP if Oscar [name] need arises. I explained Oscar [name] importance of being compliant with PAP treatment, not only for insurance purposes but primarily to improve Oscar [name] symptoms, and for Oscar [name] [name]'s long term health benefit, including to reduce Oscar [name] cardiovascular risks. I answered all their questions today and Oscar [name] [name] and Oscar [name] [name] were in agreement. I would like to see him back after Oscar [name] sleep study is completed and encouraged them to call with any interim questions, concerns, problems or updates.    Thank you very much for allowing me to participate in Oscar [name] care of this nice [name]. If I can be of any further  assistance to you please do not hesitate to call me at [phone].  Sincerely,   Star Age, MD, PhD

## 2013-11-05 NOTE — Patient Instructions (Addendum)
Based on your symptoms and your exam I believe you are at risk for obstructive sleep apnea or OSA, and I think we should proceed with a sleep study to determine whether you do or do not have OSA and how severe it is. If you have more than mild OSA, I want you to consider treatment with CPAP. Please remember, the risks and ramifications of moderate to severe obstructive sleep apnea or OSA are: Cardiovascular disease, including congestive heart failure, stroke, difficult to control hypertension, arrhythmias, and even type 2 diabetes has been linked to untreated OSA. Sleep apnea causes disruption of sleep and sleep deprivation in most cases, which, in turn, can cause recurrent headaches, problems with memory, mood, concentration, focus, and vigilance. Most people with untreated sleep apnea report excessive daytime sleepiness, which can affect their ability to drive. Please do not drive if you feel sleepy.  I will see you back after your sleep study to go over the test results and where to go from there. We will call you after your sleep study and to set up an appointment at the time.   Drink more water.

## 2013-11-24 DIAGNOSIS — E782 Mixed hyperlipidemia: Secondary | ICD-10-CM | POA: Insufficient documentation

## 2013-11-24 HISTORY — DX: Mixed hyperlipidemia: E78.2

## 2013-12-08 ENCOUNTER — Ambulatory Visit (INDEPENDENT_AMBULATORY_CARE_PROVIDER_SITE_OTHER): Payer: Self-pay

## 2013-12-08 DIAGNOSIS — R0609 Other forms of dyspnea: Secondary | ICD-10-CM

## 2013-12-08 DIAGNOSIS — Z0289 Encounter for other administrative examinations: Secondary | ICD-10-CM

## 2013-12-08 DIAGNOSIS — G4761 Periodic limb movement disorder: Secondary | ICD-10-CM

## 2013-12-08 DIAGNOSIS — G4733 Obstructive sleep apnea (adult) (pediatric): Secondary | ICD-10-CM

## 2013-12-08 DIAGNOSIS — R0989 Other specified symptoms and signs involving the circulatory and respiratory systems: Secondary | ICD-10-CM

## 2013-12-08 DIAGNOSIS — G4720 Circadian rhythm sleep disorder, unspecified type: Secondary | ICD-10-CM

## 2013-12-22 ENCOUNTER — Telehealth: Payer: Self-pay | Admitting: Neurology

## 2013-12-22 ENCOUNTER — Encounter: Payer: Self-pay | Admitting: *Deleted

## 2013-12-22 NOTE — Telephone Encounter (Signed)
Please call and notify the patient that the recent sleep study did not show any significant obstructive sleep apnea. Please inform patient that I would like to go over the details of the study during a follow up appointment and if not already previously scheduled, arrange a followup appointment (please utilize a followu-up slot). Also, route or fax report to PCP and referring MD, if other than PCP.  Once you have spoken to patient, you can close this encounter.   Thanks,  Annelies Coyt, MD, PhD [facility] ([facility]  

## 2013-12-22 NOTE — Telephone Encounter (Signed)
I called and spoke with the patient about his recent sleep study results. I informed the patient that the study did not show any significant obstructive sleep apnea and that Dr. Rexene Alberts would like to discuss the results in detail in a follow up appointment. Patient stated that would not be necessary. I informed the patient that I will fax a copy to Eldridge Abrahams, NP office and mail a copy of the report to the patient.

## 2014-01-12 DIAGNOSIS — Z87891 Personal history of nicotine dependence: Secondary | ICD-10-CM

## 2014-01-12 DIAGNOSIS — G459 Transient cerebral ischemic attack, unspecified: Secondary | ICD-10-CM

## 2014-01-12 HISTORY — DX: Transient cerebral ischemic attack, unspecified: G45.9

## 2014-01-12 HISTORY — DX: Personal history of nicotine dependence: Z87.891

## 2014-01-19 DIAGNOSIS — R946 Abnormal results of thyroid function studies: Secondary | ICD-10-CM | POA: Insufficient documentation

## 2014-01-19 HISTORY — DX: Abnormal results of thyroid function studies: R94.6

## 2014-02-21 ENCOUNTER — Other Ambulatory Visit (HOSPITAL_COMMUNITY): Payer: Self-pay | Admitting: Family Medicine

## 2014-02-21 DIAGNOSIS — G459 Transient cerebral ischemic attack, unspecified: Secondary | ICD-10-CM

## 2014-02-22 ENCOUNTER — Ambulatory Visit (HOSPITAL_COMMUNITY)
Admission: RE | Admit: 2014-02-22 | Discharge: 2014-02-22 | Disposition: A | Discharge status: Home / Self Care | Payer: Medicare Other | Source: Ambulatory Visit | Attending: Internal Medicine | Admitting: Internal Medicine

## 2014-02-22 DIAGNOSIS — G459 Transient cerebral ischemic attack, unspecified: Secondary | ICD-10-CM | POA: Insufficient documentation

## 2014-02-22 DIAGNOSIS — I519 Heart disease, unspecified: Secondary | ICD-10-CM

## 2014-02-22 NOTE — Progress Notes (Signed)
2D Echocardiogram Complete.  02/22/2014   Joesiah Lonon, RDCS  

## 2014-03-14 DIAGNOSIS — Z8551 Personal history of malignant neoplasm of bladder: Secondary | ICD-10-CM

## 2014-03-14 HISTORY — DX: Personal history of malignant neoplasm of bladder: Z85.51

## 2014-04-09 DIAGNOSIS — G459 Transient cerebral ischemic attack, unspecified: Secondary | ICD-10-CM

## 2014-04-09 HISTORY — DX: Transient cerebral ischemic attack, unspecified: G45.9

## 2014-04-12 DIAGNOSIS — I499 Cardiac arrhythmia, unspecified: Secondary | ICD-10-CM | POA: Insufficient documentation

## 2014-04-13 ENCOUNTER — Encounter: Payer: Self-pay | Admitting: Cardiovascular Disease

## 2014-05-12 ENCOUNTER — Encounter: Payer: Self-pay | Admitting: *Deleted

## 2014-05-16 ENCOUNTER — Encounter: Payer: Self-pay | Admitting: Cardiovascular Disease

## 2014-05-16 ENCOUNTER — Ambulatory Visit (INDEPENDENT_AMBULATORY_CARE_PROVIDER_SITE_OTHER): Payer: Medicare Other | Admitting: Cardiovascular Disease

## 2014-05-16 VITALS — BP 120/72 | HR 68 | Resp 16 | Ht 67.75 in | Wt 165.5 lb

## 2014-05-16 DIAGNOSIS — R55 Syncope and collapse: Secondary | ICD-10-CM

## 2014-05-16 DIAGNOSIS — I472 Ventricular tachycardia, unspecified: Secondary | ICD-10-CM

## 2014-05-16 DIAGNOSIS — R0602 Shortness of breath: Secondary | ICD-10-CM

## 2014-05-16 DIAGNOSIS — I4729 Other ventricular tachycardia: Secondary | ICD-10-CM

## 2014-05-16 HISTORY — DX: Syncope and collapse: R55

## 2014-05-16 NOTE — Patient Instructions (Signed)
Your physician has requested that you have en exercise stress myoview. For further information please visit [email or web address]. Please follow instruction sheet, as given.  Dr. Sallyanne Kuster would like you to schedule a LOOP RECORDER IMPLANT for Tuesday September 1st with him at [facility].   Your physician recommends that you schedule a follow-up appointment in week of Septmeber 8-11 for wound site check - after loop recorder implant.

## 2014-05-16 NOTE — Progress Notes (Signed)
Patient ID: Oscar [name], male   DOB: [birth year], 80 y.o.   MRN: [id number]      Reason for office visit TIA versus presyncope  This is Oscar [name] is first visit to our cardiology office in over 5 years and I have not met him before. His wife, Oscar [name] ,is my patient.  In the past he has reportedly had a normal coronary angiogram (10 years ago) and in 2010 had a normal nuclear stress test and a normal echocardiogram. He has treated hyperlipidemia but does not have diabetes or hypertension.  Over the years he has had several episodes of sudden diplopia and weakness and has had an extensive negative neurological workup. Most recently when he was driving in mid July he suddenly became weak and disoriented, with a sensation that all the blood was rushing to his head and had to pull the car over. He was unable to speak. His wife took over the driving and took him to the hospital. Shortly he became oriented and was again able to speak. While in the hospital at [facility] he reportedly had a brief run of nonsustained ventricular tachycardia that was asymptomatic. Imaging of the head and carotids was normal. His electrolytes were normal and he was discharged for outpatient followup.  He is otherwise quite fit. At age 72 he plays golf several times a week and never uses the cart. Until recently he was carrying his clubs but had a rib injury and now uses a pull-cart. He denies problems with shortness of breath or chest pain on the golf course, but his exercise tolerance has diminished.  5 years ago his electrocardiogram showed sinus rhythm and an incomplete right bundle branch block. He now has a full right bundle branch block with a QRS duration of about 140 ms. PR interval is normal. He does not have repolarization abnormalities.  His echocardiogram shows normal left ventricular systolic function and the absence of any serious valvular abnormalities or abnormal regional wall motion. He actually had 2  echoes performed: one in June in [place], the other on July 19 at [facility].   No Known Allergies  Current Outpatient Prescriptions  Medication Sig Dispense Refill  . aspirin EC 81 MG tablet Take 81 mg by mouth daily.      . Glucos-Chondroit-Hyaluron-MSM (GLUCOSAMINE CHONDROITIN JOINT PO) Take by mouth daily.      . Multiple Vitamin (MULTIVITAMIN) capsule Take 1 capsule by mouth daily.      . Naproxen Sodium (ALEVE) 220 MG CAPS Take 1 tablet by mouth daily as needed.      Marland Kitchen omeprazole (PRILOSEC) 20 MG capsule Take 20 mg by mouth every morning.      . simvastatin (ZOCOR) 20 MG tablet Take 20 mg by mouth every evening.       No current facility-administered medications for this visit.    Past Medical History  Diagnosis Date  . Bladder tumor   . GERD (gastroesophageal reflux disease)   . Hyperlipidemia   . History of peptic ulcer 1970'S  . History of concussion X2   NO RESIDUALS  . History of appendectomy     Past Surgical History  Procedure Laterality Date  . Transurethral resection of bladder tumor  03-10-2008  . Inguinal hernia repair Right 07-14-2002  . Appendectomy  1970  . Cystoscopy with biopsy N/A 02/10/2013    Procedure: CYSTOSCOPY WITH COLD CUP BIOPSY/FULGERATION;  Surgeon: Bernestine Amass, MD;  Location: [facility];  Service: Urology;  Laterality:  N/A;  ALSO FULGERATION   . Cardiovascular stress test  03/22/09    Family History  Problem Relation Age of Onset  . Heart failure Mother   . Stroke Father   . Diabetes Brother   . Heart failure Brother     History   Social History  . Marital Status: Married    Spouse Name: N/A    Number of Children: N/A  . Years of Education: N/A   Occupational History  . Not on file.   Social History Main Topics  . Smoking status: Former Smoker -- 0.50 packs/day for 30 years    Types: Cigarettes    Quit date: 02/06/1979  . Smokeless tobacco: Never Used  . Alcohol Use: No  . Drug Use: No  . Sexual  Activity: Not on file   Other Topics Concern  . Not on file   Social History Narrative  . No narrative on file    Review of systems: The patient specifically denies any chest pain at rest or with exertion, dyspnea at rest, orthopnea, paroxysmal nocturnal dyspnea, syncope, palpitations, focal neurological deficits, intermittent claudication, lower extremity edema, unexplained weight gain, cough, hemoptysis or wheezing.  The patient also denies abdominal pain, nausea, vomiting, dysphagia, diarrhea, constipation, polyuria, polydipsia, dysuria, hematuria, frequency, urgency, abnormal bleeding or bruising, fever, chills, unexpected weight changes, mood swings, change in skin or hair texture, change in voice quality, auditory or visual problems, allergic reactions or rashes, new musculoskeletal complaints other than usual "aches and pains".   PHYSICAL EXAM BP 120/72  Pulse 68  Resp 16  Ht 5' 7.75" (1.721 m)  Wt 165 lb 8 oz (75.07 kg)  BMI 25.35 kg/m2  General: Alert, oriented x3, no distress Head: no evidence of trauma, PERRL, EOMI, no exophtalmos or lid lag, no myxedema, no xanthelasma; normal ears, nose and oropharynx Neck: normal jugular venous pulsations and no hepatojugular reflux; brisk carotid pulses without delay and no carotid bruits Chest: clear to auscultation, no signs of consolidation by percussion or palpation, normal fremitus, symmetrical and full respiratory excursions Cardiovascular: normal position and quality of the apical impulse, regular rhythm, normal first and widely split second heart sounds, no murmurs, rubs or gallops Abdomen: no tenderness or distention, no masses by palpation, no abnormal pulsatility or arterial bruits, normal bowel sounds, no hepatosplenomegaly Extremities: no clubbing, cyanosis or edema; 2+ radial, ulnar and brachial pulses bilaterally; 2+ right femoral, posterior tibial and dorsalis pedis pulses; 2+ left femoral, posterior tibial and dorsalis  pedis pulses; no subclavian or femoral bruits Neurological: grossly nonfocal   EKG: Normal sinus rhythm, PR interval 186 ms, QRS 140 ms with right bundle branch block typical morphology, QTC 427 ms  Lipid Panel  No results found for this basename: chol, trig, hdl, cholhdl, vldl, ldlcalc    BMET    Component Value Date/Time   NA 140 06/22/2007 0759   K 3.8 06/22/2007 0759   CL 109 06/22/2007 0759   GLUCOSE 109* 06/22/2007 0759   BUN 20 06/22/2007 0759   CREATININE 1.2 06/22/2007 0759     ASSESSMENT AND PLAN Near syncope I wonder whether Mr. Peddie so-called episodes of TIA, with their rather atypical neurological presentation (vertical diplopia) might actually be episodes of near syncope. He had nonsustained ventricular tachycardia while on the monitor at [facility]. The records do not indicate how long this episode was. He also has a right bundle branch block and is 80 years old, suggesting that he may be at risk for high-grade A-V  block. I recommended that he receive an implantable loop recorder for possible arrhythmia. The risks and benefits of this device were discussed in detail and he wishes to proceed. I also think it is worthwhile reevaluating him for coronary disease since he has male gender, advanced age and hyperlipidemia. He seems to have some recent reduction in stamina and I think a possible ischemic cause of his ventricular tachycardia should be explored.   Orders Placed This Encounter  Procedures  . Myocardial Perfusion Imaging  . LOOP RECORDER IMPLANT   Meds ordered this encounter  Medications  . Multiple Vitamin (MULTIVITAMIN) capsule    Sig: Take 1 capsule by mouth daily.  Marland Kitchen aspirin EC 81 MG tablet    Sig: Take 81 mg by mouth daily.  . Naproxen Sodium (ALEVE) 220 MG CAPS    Sig: Take 1 tablet by mouth daily as needed.    Holli Humbles, MD, [place] [phone] office [phone] pager

## 2014-05-16 NOTE — Assessment & Plan Note (Signed)
I wonder whether Oscar [name] so-called episodes of TIA, but they're rather atypical neurological presentation (vertical diplopia) might actually be episodes of near syncope. He had nonsustained ventricular tachycardia while on the monitor at [facility]. The records do not indicate how long this episode was. He also has a right bundle branch block and is 80 years old, suggesting that he may be at risk for high-grade A-V block. I recommended that he receive an implantable loop recorder. The risks and benefits of this device were discussed in detail and he wishes to proceed. I also think it is worthwhile reevaluating him for coronary disease since he has male gender, advanced age and hyperlipidemia. He seems to have some recent reduction in stamina and I think a possible ischemic cause of his ventricular tachycardia should be explored.

## 2014-05-18 ENCOUNTER — Telehealth (HOSPITAL_COMMUNITY): Payer: Self-pay

## 2014-05-18 NOTE — Telephone Encounter (Signed)
Encounter complete. 

## 2014-05-19 ENCOUNTER — Encounter (HOSPITAL_COMMUNITY): Payer: Self-pay | Admitting: Pharmacy Technician

## 2014-05-19 ENCOUNTER — Telehealth (HOSPITAL_COMMUNITY): Payer: Self-pay

## 2014-05-19 NOTE — Telephone Encounter (Signed)
Encounter complete. 

## 2014-05-20 ENCOUNTER — Ambulatory Visit (HOSPITAL_COMMUNITY)
Admission: RE | Admit: 2014-05-20 | Discharge: 2014-05-20 | Disposition: A | Discharge status: Home / Self Care | Payer: Medicare Other | Source: Ambulatory Visit | Attending: Cardiology | Admitting: Cardiology

## 2014-05-20 DIAGNOSIS — Z87891 Personal history of nicotine dependence: Secondary | ICD-10-CM | POA: Diagnosis not present

## 2014-05-20 DIAGNOSIS — I4510 Unspecified right bundle-branch block: Secondary | ICD-10-CM | POA: Insufficient documentation

## 2014-05-20 DIAGNOSIS — Z01810 Encounter for preprocedural cardiovascular examination: Secondary | ICD-10-CM | POA: Insufficient documentation

## 2014-05-20 DIAGNOSIS — Z8673 Personal history of transient ischemic attack (TIA), and cerebral infarction without residual deficits: Secondary | ICD-10-CM | POA: Diagnosis not present

## 2014-05-20 DIAGNOSIS — I472 Ventricular tachycardia, unspecified: Secondary | ICD-10-CM | POA: Insufficient documentation

## 2014-05-20 DIAGNOSIS — Z8249 Family history of ischemic heart disease and other diseases of the circulatory system: Secondary | ICD-10-CM | POA: Insufficient documentation

## 2014-05-20 DIAGNOSIS — R0989 Other specified symptoms and signs involving the circulatory and respiratory systems: Secondary | ICD-10-CM | POA: Insufficient documentation

## 2014-05-20 DIAGNOSIS — R0602 Shortness of breath: Secondary | ICD-10-CM | POA: Diagnosis present

## 2014-05-20 DIAGNOSIS — R0609 Other forms of dyspnea: Secondary | ICD-10-CM | POA: Diagnosis not present

## 2014-05-20 DIAGNOSIS — E785 Hyperlipidemia, unspecified: Secondary | ICD-10-CM | POA: Diagnosis not present

## 2014-05-20 DIAGNOSIS — I4729 Other ventricular tachycardia: Secondary | ICD-10-CM | POA: Diagnosis present

## 2014-05-20 DIAGNOSIS — R55 Syncope and collapse: Secondary | ICD-10-CM | POA: Diagnosis not present

## 2014-05-20 DIAGNOSIS — R42 Dizziness and giddiness: Secondary | ICD-10-CM | POA: Insufficient documentation

## 2014-05-20 MED ORDER — TECHNETIUM TC 99M SESTAMIBI GENERIC - CARDIOLITE
30.3000 | Freq: Once | INTRAVENOUS | Status: AC | PRN
  Administered 2014-05-20: 30.3 via INTRAVENOUS

## 2014-05-20 MED ORDER — TECHNETIUM TC 99M SESTAMIBI GENERIC - CARDIOLITE
10.9000 | Freq: Once | INTRAVENOUS | Status: AC | PRN
  Administered 2014-05-20: 10.9 via INTRAVENOUS

## 2014-05-20 NOTE — Procedures (Addendum)
[place] [address] [place] [address] [phone]  Cardiology Nuclear Med Study  Oscar [name] is a 80 y.o. male     MRN : [id number]     DOB: 1931-07-14  Procedure Date: 05/20/2014  Nuclear Med Background Indication for Stress Test:  Surgical Clearance, [facility] and Pt getting clearance for Loop Recorder placement. History:  NSVT;Last NUC MPI on 03/22/2009-nonischemic;EF=68%;ECHO on 04/10/2014-LVEF=60-65%;No prior respiratory history reported. Cardiac Risk Factors: Family History - CAD, History of Smoking, Lipids, RBBB and TIA  Symptoms:  Dizziness, DOE, Light-Headedness, Near Syncope and SOB   Nuclear Pre-Procedure Caffeine/Decaff Intake:  7:00pm NPO After: 5:00am   IV Site: R Forearm  IV 0.9% NS with Angio Cath:  22g  Chest Size (in):  44"  IV Started by: Rolene Course, RN  Height: 5\' 8"  (1.727 m)  Cup Size: n/a  BMI:  Body mass index is 25.09 kg/(m^2). Weight:  165 lb (74.844 kg)   Tech Comments:  n/a    Nuclear Med Study 1 or 2 day study: 1 day  Stress Test Type:  Stress  Order Authorizing Provider:  Sanda Klein, MD   Resting Radionuclide: Technetium 93m Sestamibi  Resting Radionuclide Dose: 10.9 mCi   Stress Radionuclide:  Technetium 16m Sestamibi  Stress Radionuclide Dose: 30.3 mCi           Stress Protocol Rest HR: 60 Stress HR: 133  Rest BP: 159/92 Stress BP: 193/89  Exercise Time (min): 7 METS: 7.0   Predicted Max HR: 139 bpm % Max HR: 95.68 bpm Rate Pressure Product: 25669  Dose of Adenosine (mg):  n/a Dose of Lexiscan: n/a mg  Dose of Atropine (mg): n/a Dose of Dobutamine: n/a mcg/kg/min (at max HR)  Stress Test Technologist: Leane Para, CCT Nuclear Technologist: Imagene Riches, CNMT   Rest Procedure:  Myocardial perfusion imaging was performed at rest 45 minutes following the intravenous administration of Technetium 1m Sestamibi. Stress Procedure:  The patient performed  treadmill exercise using a Bruce  Protocol for 7 minutes. The patient stopped due to SOB and denied any chest pain.  There were no significant ST-T wave changes.  Technetium 81m Sestamibi was injected at peak exercise and myocardial perfusion imaging was performed after a brief delay.  Transient Ischemic Dilatation (Normal <1.22):  0.99  QGS EDV:  86 ml QGS ESV:  34 ml LV Ejection Fraction: 61%       Rest ECG: NSR-RBBB  Stress ECG: Occasional PVC's with stress with several ventricular couplets; nondiagnostic ST changes inferiorly  QPS Raw Data Images:  Normal; no motion artifact; normal heart/lung ratio. Stress Images:  Normal homogeneous uptake in all areas of the myocardium. Rest Images:  Normal homogeneous uptake in all areas of the myocardium. Subtraction (SDS):  Normal  Impression Exercise Capacity:  Good exercise capacity. BP Response:  BP increased to 193 during Stage I and decreased to 180 during Stage 2 Clinical Symptoms:  Mild shortness of breath ECG Impression:  Insignificant upsloping ST segment depression. Comparison with Prior Nuclear Study: No significant change from previous study  Overall Impression:  Low risk stress nuclear study demonstrating exercise induced ectopy with PVC's and several ventricular couplets with normal scintigraphic images.  LV Wall Motion:  NL LV Function, EF 61%; NL Wall Motion   Oscar [name] A, MD  05/20/2014 12:59 PM

## 2014-05-24 ENCOUNTER — Ambulatory Visit (HOSPITAL_COMMUNITY)
Admission: RE | Admit: 2014-05-24 | Discharge: 2014-05-24 | Disposition: A | Discharge status: Home / Self Care | Payer: Medicare Other | Source: Ambulatory Visit | Attending: Cardiovascular Disease | Admitting: Cardiovascular Disease

## 2014-05-24 ENCOUNTER — Encounter (HOSPITAL_COMMUNITY)
Admission: RE | Disposition: A | Discharge status: Home / Self Care | Payer: Self-pay | Source: Ambulatory Visit | Attending: Cardiovascular Disease

## 2014-05-24 DIAGNOSIS — Z7982 Long term (current) use of aspirin: Secondary | ICD-10-CM | POA: Insufficient documentation

## 2014-05-24 DIAGNOSIS — E785 Hyperlipidemia, unspecified: Secondary | ICD-10-CM | POA: Diagnosis not present

## 2014-05-24 DIAGNOSIS — K219 Gastro-esophageal reflux disease without esophagitis: Secondary | ICD-10-CM | POA: Diagnosis not present

## 2014-05-24 DIAGNOSIS — Z87891 Personal history of nicotine dependence: Secondary | ICD-10-CM | POA: Insufficient documentation

## 2014-05-24 DIAGNOSIS — R55 Syncope and collapse: Secondary | ICD-10-CM | POA: Diagnosis present

## 2014-05-24 HISTORY — PX: LOOP RECORDER IMPLANT: SHX5477

## 2014-05-24 SURGERY — LOOP RECORDER IMPLANT
Anesthesia: LOCAL

## 2014-05-24 MED ORDER — LIDOCAINE HCL (PF) 1 % IJ SOLN
INTRAMUSCULAR | Status: AC
  Filled 2014-05-24: qty 30

## 2014-05-24 NOTE — H&P (View-Only) (Signed)
Patient ID: Oscar [name], male   DOB: 11/05/1930, 80 y.o.   MRN: [id number]      Reason for office visit TIA versus presyncope  This is Oscar [name] is first visit to our cardiology office in over 5 years and I have not met him before. His wife, Oscar [name] ,is my patient.  In the past he has reportedly had a normal coronary angiogram (10 years ago) and in 2010 had a normal nuclear stress test and a normal echocardiogram. He has treated hyperlipidemia but does not have diabetes or hypertension.  Over the years he has had several episodes of sudden diplopia and weakness and has had an extensive negative neurological workup. Most recently when he was driving in mid July he suddenly became weak and disoriented, with a sensation that all the blood was rushing to his head and had to pull the car over. He was unable to speak. His wife took over the driving and took him to the hospital. Shortly he became oriented and was again able to speak. While in the hospital at [facility] he reportedly had a brief run of nonsustained ventricular tachycardia that was asymptomatic. Imaging of the head and carotids was normal. His electrolytes were normal and he was discharged for outpatient followup.  He is otherwise quite fit. At age 80 he plays golf several times a week and never uses the cart. Until recently he was carrying his clubs but had a rib injury and now uses a pull-cart. He denies problems with shortness of breath or chest pain on the golf course, but his exercise tolerance has diminished.  5 years ago his electrocardiogram showed sinus rhythm and an incomplete right bundle branch block. He now has a full right bundle branch block with a QRS duration of about 140 ms. PR interval is normal. He does not have repolarization abnormalities.  His echocardiogram shows normal left ventricular systolic function and the absence of any serious valvular abnormalities or abnormal regional wall motion. He actually had 2  echoes performed: one in June in [place], the other on July 19 at [facility].   No Known Allergies  Current Outpatient Prescriptions  Medication Sig Dispense Refill  . aspirin EC 81 MG tablet Take 81 mg by mouth daily.      . Glucos-Chondroit-Hyaluron-MSM (GLUCOSAMINE CHONDROITIN JOINT PO) Take by mouth daily.      . Multiple Vitamin (MULTIVITAMIN) capsule Take 1 capsule by mouth daily.      . Naproxen Sodium (ALEVE) 220 MG CAPS Take 1 tablet by mouth daily as needed.      . omeprazole (PRILOSEC) 20 MG capsule Take 20 mg by mouth every morning.      . simvastatin (ZOCOR) 20 MG tablet Take 20 mg by mouth every evening.       No current facility-administered medications for this visit.    Past Medical History  Diagnosis Date  . Bladder tumor   . GERD (gastroesophageal reflux disease)   . Hyperlipidemia   . History of peptic ulcer 1970'S  . History of concussion X2   NO RESIDUALS  . History of appendectomy     Past Surgical History  Procedure Laterality Date  . Transurethral resection of bladder tumor  03-10-2008  . Inguinal hernia repair Right 07-14-2002  . Appendectomy  1970  . Cystoscopy with biopsy N/A 02/10/2013    Procedure: CYSTOSCOPY WITH COLD CUP BIOPSY/FULGERATION;  Surgeon: Oscar S Grapey, MD;  Location: [facility];  Service: Urology;  Laterality:   N/A;  ALSO FULGERATION   . Cardiovascular stress test  03/22/09    Family History  Problem Relation Age of Onset  . Heart failure Mother   . Stroke Father   . Diabetes Brother   . Heart failure Brother     History   Social History  . Marital Status: Married    Spouse Name: N/A    Number of Children: N/A  . Years of Education: N/A   Occupational History  . Not on file.   Social History Main Topics  . Smoking status: Former Smoker -- 0.50 packs/day for 30 years    Types: Cigarettes    Quit date: 02/06/1979  . Smokeless tobacco: Never Used  . Alcohol Use: No  . Drug Use: No  . Sexual  Activity: Not on file   Other Topics Concern  . Not on file   Social History Narrative  . No narrative on file    Review of systems: The patient specifically denies any chest pain at rest or with exertion, dyspnea at rest, orthopnea, paroxysmal nocturnal dyspnea, syncope, palpitations, focal neurological deficits, intermittent claudication, lower extremity edema, unexplained weight gain, cough, hemoptysis or wheezing.  The patient also denies abdominal pain, nausea, vomiting, dysphagia, diarrhea, constipation, polyuria, polydipsia, dysuria, hematuria, frequency, urgency, abnormal bleeding or bruising, fever, chills, unexpected weight changes, mood swings, change in skin or hair texture, change in voice quality, auditory or visual problems, allergic reactions or rashes, new musculoskeletal complaints other than usual "aches and pains".   PHYSICAL EXAM BP 120/72  Pulse 68  Resp 16  Ht 5' 7.75" (1.721 m)  Wt 165 lb 8 oz (75.07 kg)  BMI 25.35 kg/m2  General: Alert, oriented x3, no distress Head: no evidence of trauma, PERRL, EOMI, no exophtalmos or lid lag, no myxedema, no xanthelasma; normal ears, nose and oropharynx Neck: normal jugular venous pulsations and no hepatojugular reflux; brisk carotid pulses without delay and no carotid bruits Chest: clear to auscultation, no signs of consolidation by percussion or palpation, normal fremitus, symmetrical and full respiratory excursions Cardiovascular: normal position and quality of the apical impulse, regular rhythm, normal first and widely split second heart sounds, no murmurs, rubs or gallops Abdomen: no tenderness or distention, no masses by palpation, no abnormal pulsatility or arterial bruits, normal bowel sounds, no hepatosplenomegaly Extremities: no clubbing, cyanosis or edema; 2+ radial, ulnar and brachial pulses bilaterally; 2+ right femoral, posterior tibial and dorsalis pedis pulses; 2+ left femoral, posterior tibial and dorsalis  pedis pulses; no subclavian or femoral bruits Neurological: grossly nonfocal   EKG: Normal sinus rhythm, PR interval 186 ms, QRS 140 ms with right bundle branch block typical morphology, QTC 427 ms  Lipid Panel  No results found for this basename: chol, trig, hdl, cholhdl, vldl, ldlcalc    BMET    Component Value Date/Time   NA 140 06/22/2007 0759   K 3.8 06/22/2007 0759   CL 109 06/22/2007 0759   GLUCOSE 109* 06/22/2007 0759   BUN 20 06/22/2007 0759   CREATININE 1.2 06/22/2007 0759     ASSESSMENT AND PLAN Near syncope I wonder whether Mr. Peddie so-called episodes of TIA, with their rather atypical neurological presentation (vertical diplopia) might actually be episodes of near syncope. He had nonsustained ventricular tachycardia while on the monitor at [facility]. The records do not indicate how long this episode was. He also has a right bundle branch block and is 80 years old, suggesting that he may be at risk for high-grade A-V  block. I recommended that he receive an implantable loop recorder for possible arrhythmia. The risks and benefits of this device were discussed in detail and he wishes to proceed. I also think it is worthwhile reevaluating him for coronary disease since he has male gender, advanced age and hyperlipidemia. He seems to have some recent reduction in stamina and I think a possible ischemic cause of his ventricular tachycardia should be explored.   Orders Placed This Encounter  Procedures  . Myocardial Perfusion Imaging  . LOOP RECORDER IMPLANT   Meds ordered this encounter  Medications  . Multiple Vitamin (MULTIVITAMIN) capsule    Sig: Take 1 capsule by mouth daily.  Marland Kitchen aspirin EC 81 MG tablet    Sig: Take 81 mg by mouth daily.  . Naproxen Sodium (ALEVE) 220 MG CAPS    Sig: Take 1 tablet by mouth daily as needed.    Holli Humbles, MD, [place] [phone] office [phone] pager

## 2014-05-24 NOTE — Op Note (Signed)
LOOP RECORDER IMPLANT   Procedure report  Procedure performed:  1. Loop recorder implantation  Reason for procedure:  1. Recurrent syncope/near-syncope; VT Procedure performed by:  Sanda Klein, MD  Complications:  None  Estimated blood loss:  <5 mL  Medications administered during procedure:  15 mL Lidocaine 1% locally Device details:  Medtronic Reveal Linq model number [id number], serial number [id number] Procedure details:  After the risks and benefits of the procedure were discussed the patient provided informed consent. The patient was prepped and draped in usual sterile fashion. Local anesthesia with 1% lidocaine was administered to an area 2 cm to the left of the sternum in the 4th intercostal space. A horizontal incision was made using the incision tool. The introducer was then used to create a subcutaneous tunnel and carefully deploy the device. Local pressure was held to ensure hemostasis.  The incision was closed with SteriStrips and a sterile dressing was applied.   Sanda Klein, MD, [place] [phone] office [phone] pager 05/24/2014 2:17 PM

## 2014-05-24 NOTE — Interval H&P Note (Signed)
History and Physical Interval Note:  05/24/2014 12:25 PM  Oscar [name]  has presented today for surgery, with the diagnosis of syncope  The various methods of treatment have been discussed with the patient and family. After consideration of risks, benefits and other options for treatment, the patient has consented to  Procedure(s): LOOP RECORDER IMPLANT (N/A) as a surgical intervention .  The patient's history has been reviewed, patient examined, no change in status, stable for surgery.  I have reviewed the patient's chart and labs.  Questions were answered to the patient's satisfaction.     Oscar [name]

## 2014-06-06 ENCOUNTER — Ambulatory Visit (INDEPENDENT_AMBULATORY_CARE_PROVIDER_SITE_OTHER): Payer: Medicare Other | Admitting: *Deleted

## 2014-06-06 DIAGNOSIS — R55 Syncope and collapse: Secondary | ICD-10-CM

## 2014-06-06 LAB — MDC_IDC_ENUM_SESS_TYPE_INCLINIC
Date Time Interrogation Session: 20150914131222
Zone Setting Detection Interval: 2000 ms
Zone Setting Detection Interval: 3000 ms
Zone Setting Detection Interval: 400 ms

## 2014-06-06 NOTE — Progress Notes (Signed)
ILR wound check appointment. Steri-strips removed. Wound without redness or edema. Incision edges approximated, wound well healed. Pt with 0 tachy episodes; 0 brady episodes; 0 asystole. Patient educated about wound care, arm mobility, lifting restrictions. ROV w/ Dr. Sallyanne Kuster 09/05/14.

## 2014-06-21 ENCOUNTER — Encounter: Payer: Self-pay | Admitting: Cardiovascular Disease

## 2014-06-23 ENCOUNTER — Ambulatory Visit (INDEPENDENT_AMBULATORY_CARE_PROVIDER_SITE_OTHER): Payer: Medicare Other | Admitting: *Deleted

## 2014-06-23 DIAGNOSIS — R55 Syncope and collapse: Secondary | ICD-10-CM

## 2014-06-23 LAB — MDC_IDC_ENUM_SESS_TYPE_REMOTE

## 2014-07-01 NOTE — Progress Notes (Signed)
Loop recorder 

## 2014-07-07 ENCOUNTER — Telehealth: Payer: Self-pay | Admitting: Cardiovascular Disease

## 2014-07-07 NOTE — Telephone Encounter (Signed)
Pt says he has a loop recorder,today he had a dizzy spell.

## 2014-07-07 NOTE — Telephone Encounter (Signed)
Returned call to patient he stated he had a dizzy spell this morning that lasted appox 10 secs.Stated he feels fine now.Stated he wanted to let Dr.Croitoru know since he has a loop recorder.Message sent to Dr.Croitoru.

## 2014-07-08 NOTE — Telephone Encounter (Signed)
Returned call to patient he stated [place] the golf [place] 07/07/14 when he had dizzy spell.Stated he held hand device over loop recorder to transmit reading.

## 2014-07-08 NOTE — Telephone Encounter (Signed)
Remote received. Patient informed that the symptom appeared to be SR btwn 90s-110. No arrhythmia or asystole present during dizzy spell. Patient voiced understanding. Remote placed in folder for [facility] to review.

## 2014-07-08 NOTE — Telephone Encounter (Signed)
Oscar [name], can you please check for a download? Oscar [name], please make sure he has been close to his transmitter. If an arrhythmia occurred it should do an automatic download as long as he is in the vicinity of the transmitter.

## 2014-07-21 ENCOUNTER — Encounter: Payer: Self-pay | Admitting: Cardiovascular Disease

## 2014-07-22 ENCOUNTER — Ambulatory Visit (INDEPENDENT_AMBULATORY_CARE_PROVIDER_SITE_OTHER): Payer: Medicare Other | Admitting: *Deleted

## 2014-07-22 DIAGNOSIS — R55 Syncope and collapse: Secondary | ICD-10-CM

## 2014-07-22 LAB — MDC_IDC_ENUM_SESS_TYPE_REMOTE

## 2014-07-25 ENCOUNTER — Encounter: Payer: Self-pay | Admitting: Cardiovascular Disease

## 2014-07-27 NOTE — Progress Notes (Signed)
Loop recorder 

## 2014-08-30 ENCOUNTER — Encounter: Payer: Self-pay | Admitting: Cardiovascular Disease

## 2014-09-01 ENCOUNTER — Encounter (HOSPITAL_COMMUNITY): Payer: Self-pay | Admitting: Cardiovascular Disease

## 2014-09-05 ENCOUNTER — Encounter: Payer: Medicare Other | Admitting: Cardiovascular Disease

## 2014-09-06 ENCOUNTER — Encounter: Payer: Medicare Other | Admitting: Cardiovascular Disease

## 2014-09-21 ENCOUNTER — Ambulatory Visit (INDEPENDENT_AMBULATORY_CARE_PROVIDER_SITE_OTHER): Payer: Medicare Other | Admitting: *Deleted

## 2014-09-21 DIAGNOSIS — R55 Syncope and collapse: Secondary | ICD-10-CM

## 2014-09-21 LAB — MDC_IDC_ENUM_SESS_TYPE_REMOTE

## 2014-09-22 NOTE — Progress Notes (Signed)
Loop recorder 

## 2014-10-21 ENCOUNTER — Ambulatory Visit (INDEPENDENT_AMBULATORY_CARE_PROVIDER_SITE_OTHER): Payer: Medicare Other | Admitting: *Deleted

## 2014-10-21 DIAGNOSIS — R55 Syncope and collapse: Secondary | ICD-10-CM

## 2014-10-25 ENCOUNTER — Encounter: Payer: Self-pay | Admitting: Cardiovascular Disease

## 2014-10-25 NOTE — Progress Notes (Signed)
Loop recorder 

## 2014-11-08 ENCOUNTER — Encounter: Payer: Self-pay | Admitting: Cardiovascular Disease

## 2014-11-08 ENCOUNTER — Ambulatory Visit (INDEPENDENT_AMBULATORY_CARE_PROVIDER_SITE_OTHER): Payer: Medicare Other | Admitting: Cardiovascular Disease

## 2014-11-08 VITALS — BP 120/70 | HR 61 | Ht 67.5 in | Wt 168.7 lb

## 2014-11-08 DIAGNOSIS — Z95818 Presence of other cardiac implants and grafts: Secondary | ICD-10-CM

## 2014-11-08 DIAGNOSIS — R42 Dizziness and giddiness: Secondary | ICD-10-CM

## 2014-11-08 HISTORY — DX: Presence of other cardiac implants and grafts: Z95.818

## 2014-11-08 NOTE — Patient Instructions (Signed)
Remote monitoring is used to monitor your Loop Recorder monitoring reduces the number of office visits required to check your device to one time per year. It allows [place] to monitor the functioning of your device to ensure it is working properly. You are scheduled for a device check from home on December 08, 2014. You may send your transmission at any time that day. If you have a wireless device, the transmission will be sent automatically. After your physician reviews your transmission, you will receive a postcard with your next transmission date.   Dr. Sallyanne Kuster recommends that you schedule a follow-up appointment in: One year.

## 2014-11-08 NOTE — Progress Notes (Signed)
Patient ID: Oscar [name], male   DOB: Aug 06, 1931, 81 y.o.   MRN: [id number]      Reason for office visit Loop recorder, pre-syncope  Oscar [name] has taken care of his wife, [name], who is recovering from complicated redo AVR performed at [facility] one month ago. He has had not had any problems of his own. He had one episode of very mild dizziness and activated his ILR - the recording shows sinus rhythm with a slight relative increase in rate. No true arrhythmia recorded.  In the past he has reportedly had a normal coronary angiogram (10 years ago) and in 2015 had a normal nuclear stress test and a normal echocardiogram. He has treated hyperlipidemia but does not have diabetes or hypertension.  Over the years he has had several episodes of sudden diplopia and weakness and has had an extensive negative neurological workup. In mid July 2015 he suddenly became weak and disoriented, with a sensation that all the blood was rushing to his head and had to pull the car over. He was unable to speak. His wife took over the driving and took him to the hospital. Shortly, he became oriented and was again able to speak. While in the hospital at [place] he reportedly had a brief run of nonsustained ventricular tachycardia that was asymptomatic. Imaging of the head and carotids was normal. His electrolytes were normal and he was discharged for outpatient followup.   No Known Allergies  Current Outpatient Prescriptions  Medication Sig Dispense Refill  . aspirin EC 81 MG tablet Take 81 mg by mouth daily.    . Glucos-Chondroit-Hyaluron-MSM (GLUCOSAMINE CHONDROITIN JOINT PO) Take 1 tablet by mouth daily.     . Multiple Vitamin (MULTIVITAMIN) capsule Take 1 capsule by mouth daily.    . Naproxen Sodium (ALEVE) 220 MG CAPS Take 1 tablet by mouth 2 (two) times daily as needed (for pain).     Marland Kitchen omeprazole (PRILOSEC) 20 MG capsule Take 20 mg by mouth every morning.    . simvastatin (ZOCOR) 20 MG tablet Take 20 mg by mouth  every evening.     No current facility-administered medications for this visit.    Past Medical History  Diagnosis Date  . Bladder tumor   . GERD (gastroesophageal reflux disease)   . Hyperlipidemia   . History of peptic ulcer 1970'S  . History of concussion X2   NO RESIDUALS  . History of appendectomy   . Status post placement of implantable loop recorder 11/08/2014    Past Surgical History  Procedure Laterality Date  . Transurethral resection of bladder tumor  03-10-2008  . Inguinal hernia repair Right 07-14-2002  . Appendectomy  1970  . Cystoscopy with biopsy N/A 02/10/2013    Procedure: CYSTOSCOPY WITH COLD CUP BIOPSY/FULGERATION;  Surgeon: Bernestine Amass, MD;  Location: [facility];  Service: Urology;  Laterality: N/A;  ALSO FULGERATION   . Cardiovascular stress test  03/22/09  . Loop recorder implant N/A 05/24/2014    Procedure: LOOP RECORDER IMPLANT;  Surgeon: Sanda Klein, MD;  Location: [facility] CATH [facility];  Service: Cardiovascular;  Laterality: N/A;    Family History  Problem Relation Age of Onset  . Heart failure Mother   . Stroke Father   . Diabetes Brother   . Heart failure Brother     History   Social History  . Marital Status: Married    Spouse Name: N/A  . Number of Children: N/A  . Years of Education: N/A  Occupational History  . Not on file.   Social History Main Topics  . Smoking status: Former Smoker -- 0.50 packs/day for 30 years    Types: Cigarettes    Quit date: 02/06/1979  . Smokeless tobacco: Never Used  . Alcohol Use: No  . Drug Use: No  . Sexual Activity: Not on file   Other Topics Concern  . Not on file   Social History Narrative    Review of systems: The patient specifically denies any chest pain at rest or with exertion, dyspnea at rest or with exertion, orthopnea, paroxysmal nocturnal dyspnea, syncope, palpitations, focal neurological deficits, intermittent claudication, lower extremity edema, unexplained weight gain,  cough, hemoptysis or wheezing.  The patient also denies abdominal pain, nausea, vomiting, dysphagia, diarrhea, constipation, polyuria, polydipsia, dysuria, hematuria, frequency, urgency, abnormal bleeding or bruising, fever, chills, unexpected weight changes, mood swings, change in skin or hair texture, change in voice quality, auditory or visual problems, allergic reactions or rashes, new musculoskeletal complaints other than usual "aches and pains".   PHYSICAL EXAM BP 120/70 mmHg  Pulse 61  Ht 5' 7.5" (1.715 m)  Wt 76.522 kg (168 lb 11.2 oz)  BMI 26.02 kg/m2  General: Alert, oriented x3, no distress Head: no evidence of trauma, PERRL, EOMI, no exophtalmos or lid lag, no myxedema, no xanthelasma; normal ears, nose and oropharynx Neck: normal jugular venous pulsations and no hepatojugular reflux; brisk carotid pulses without delay and no carotid bruits Chest: clear to auscultation, no signs of consolidation by percussion or palpation, normal fremitus, symmetrical and full respiratory excursions, healthy loop recorder site Cardiovascular: normal position and quality of the apical impulse, regular rhythm, normal first and second heart sounds, no murmurs, rubs or gallops Abdomen: no tenderness or distention, no masses by palpation, no abnormal pulsatility or arterial bruits, normal bowel sounds, no hepatosplenomegaly Extremities: no clubbing, cyanosis or edema; 2+ radial, ulnar and brachial pulses bilaterally; 2+ right femoral, posterior tibial and dorsalis pedis pulses; 2+ left femoral, posterior tibial and dorsalis pedis pulses; no subclavian or femoral bruits Neurological: grossly nonfocal   EKG: NSR  Lipid Panel  No results found for: CHOL, TRIG, HDL, CHOLHDL, VLDL, LDLCALC, LDLDIRECT  BMET    Component Value Date/Time   NA 140 06/22/2007 0759   K 3.8 06/22/2007 0759   CL 109 06/22/2007 0759   GLUCOSE 109* 06/22/2007 0759   BUN 20 06/22/2007 0759   CREATININE 1.2 06/22/2007 0759       ASSESSMENT AND PLAN No meaningful brady or tachy arrhythmia, but also no true syncope/near syncope since loop recorder implantation. No evidence of meaningful structural heart disease. Continue ILR remote monitoring and yearly follow up. Orders Placed This Encounter  Procedures  . EKG 12-Lead   No orders of the defined types were placed in this encounter.    Holli Humbles, MD, [place] [phone] office [phone] pager

## 2014-11-10 LAB — MDC_IDC_ENUM_SESS_TYPE_REMOTE

## 2014-11-21 ENCOUNTER — Ambulatory Visit (INDEPENDENT_AMBULATORY_CARE_PROVIDER_SITE_OTHER): Payer: Medicare Other | Admitting: *Deleted

## 2014-11-21 DIAGNOSIS — R55 Syncope and collapse: Secondary | ICD-10-CM

## 2014-11-21 LAB — MDC_IDC_ENUM_SESS_TYPE_INCLINIC

## 2014-11-24 NOTE — Progress Notes (Signed)
Loop recorder 

## 2014-11-25 ENCOUNTER — Encounter: Payer: Self-pay | Admitting: Cardiovascular Disease

## 2014-12-09 LAB — MDC_IDC_ENUM_SESS_TYPE_REMOTE

## 2014-12-12 ENCOUNTER — Encounter: Payer: Self-pay | Admitting: Cardiovascular Disease

## 2014-12-20 ENCOUNTER — Ambulatory Visit (INDEPENDENT_AMBULATORY_CARE_PROVIDER_SITE_OTHER): Payer: Medicare Other | Admitting: *Deleted

## 2014-12-20 ENCOUNTER — Encounter: Payer: Self-pay | Admitting: Cardiovascular Disease

## 2014-12-20 DIAGNOSIS — R55 Syncope and collapse: Secondary | ICD-10-CM | POA: Diagnosis not present

## 2014-12-22 NOTE — Progress Notes (Signed)
Loop recorder 

## 2014-12-23 ENCOUNTER — Encounter: Payer: Self-pay | Admitting: Cardiovascular Disease

## 2015-01-16 LAB — MDC_IDC_ENUM_SESS_TYPE_REMOTE

## 2015-01-31 ENCOUNTER — Encounter: Payer: Self-pay | Admitting: Cardiovascular Disease

## 2015-02-17 ENCOUNTER — Ambulatory Visit (INDEPENDENT_AMBULATORY_CARE_PROVIDER_SITE_OTHER): Payer: Medicare Other | Admitting: *Deleted

## 2015-02-17 DIAGNOSIS — R55 Syncope and collapse: Secondary | ICD-10-CM | POA: Diagnosis not present

## 2015-02-23 ENCOUNTER — Encounter: Payer: Self-pay | Admitting: Internal Medicine

## 2015-02-28 LAB — CUP PACEART REMOTE DEVICE CHECK: Date Time Interrogation Session: 20160607112003

## 2015-02-28 NOTE — Progress Notes (Signed)
Loop recorder 

## 2015-03-17 ENCOUNTER — Encounter: Payer: Self-pay | Admitting: Cardiovascular Disease

## 2015-03-20 ENCOUNTER — Ambulatory Visit (INDEPENDENT_AMBULATORY_CARE_PROVIDER_SITE_OTHER): Payer: Medicare Other | Admitting: *Deleted

## 2015-03-20 DIAGNOSIS — R55 Syncope and collapse: Secondary | ICD-10-CM

## 2015-03-21 NOTE — Progress Notes (Signed)
Loop recorder 

## 2015-03-22 LAB — CUP PACEART REMOTE DEVICE CHECK: Date Time Interrogation Session: 20160629120130

## 2015-04-19 ENCOUNTER — Ambulatory Visit (INDEPENDENT_AMBULATORY_CARE_PROVIDER_SITE_OTHER): Payer: Medicare Other

## 2015-04-19 DIAGNOSIS — R55 Syncope and collapse: Secondary | ICD-10-CM | POA: Diagnosis not present

## 2015-04-26 NOTE — Progress Notes (Signed)
Loop recorder 

## 2015-05-05 LAB — CUP PACEART REMOTE DEVICE CHECK: MDC IDC SESS DTM: 20160812152123

## 2015-05-11 ENCOUNTER — Encounter: Payer: Self-pay | Admitting: Cardiovascular Disease

## 2015-05-19 ENCOUNTER — Ambulatory Visit (INDEPENDENT_AMBULATORY_CARE_PROVIDER_SITE_OTHER): Payer: Medicare Other | Admitting: *Deleted

## 2015-05-19 DIAGNOSIS — R55 Syncope and collapse: Secondary | ICD-10-CM

## 2015-05-24 NOTE — Progress Notes (Signed)
Loop recorder 

## 2015-06-02 LAB — CUP PACEART REMOTE DEVICE CHECK: Date Time Interrogation Session: 20160909102817

## 2015-06-02 NOTE — Progress Notes (Signed)
Carelink summary report received. Battery status OK. Normal device function. No new symptom episodes, tachy episodes, brady, or pause episodes. No new AF episodes. Monthly summary reports and ROV with [place] [facility] in 10/2015.

## 2015-06-05 ENCOUNTER — Encounter: Payer: Self-pay | Admitting: Cardiovascular Disease

## 2015-06-05 ENCOUNTER — Other Ambulatory Visit: Payer: Self-pay | Admitting: Nurse Practitioner

## 2015-06-05 DIAGNOSIS — R42 Dizziness and giddiness: Secondary | ICD-10-CM

## 2015-06-07 ENCOUNTER — Ambulatory Visit
Admission: RE | Admit: 2015-06-07 | Discharge: 2015-06-07 | Disposition: A | Discharge status: Home / Self Care | Payer: Medicare Other | Source: Ambulatory Visit | Attending: Nurse Practitioner | Admitting: Nurse Practitioner

## 2015-06-07 DIAGNOSIS — R42 Dizziness and giddiness: Secondary | ICD-10-CM

## 2015-06-19 ENCOUNTER — Ambulatory Visit (INDEPENDENT_AMBULATORY_CARE_PROVIDER_SITE_OTHER): Payer: Medicare Other | Admitting: *Deleted

## 2015-06-19 DIAGNOSIS — R55 Syncope and collapse: Secondary | ICD-10-CM

## 2015-06-22 NOTE — Progress Notes (Signed)
Loop recorder 

## 2015-06-26 ENCOUNTER — Encounter: Payer: Self-pay | Admitting: Cardiovascular Disease

## 2015-06-28 LAB — CUP PACEART REMOTE DEVICE CHECK: Date Time Interrogation Session: 20160925193853

## 2015-06-28 NOTE — Progress Notes (Signed)
Carelink summary report received. Battery status OK. Normal device function. No new symptom episodes, tachy episodes, brady, or pause episodes. No new AF episodes. Monthly summary reports and ROV w/ [facility] 2/17.

## 2015-07-18 ENCOUNTER — Ambulatory Visit (INDEPENDENT_AMBULATORY_CARE_PROVIDER_SITE_OTHER): Payer: Medicare Other | Admitting: *Deleted

## 2015-07-18 DIAGNOSIS — R55 Syncope and collapse: Secondary | ICD-10-CM

## 2015-07-19 NOTE — Progress Notes (Signed)
Loop recorder 

## 2015-08-01 ENCOUNTER — Encounter: Payer: Self-pay | Admitting: Cardiovascular Disease

## 2015-08-18 LAB — CUP PACEART REMOTE DEVICE CHECK: Date Time Interrogation Session: 20161025200616

## 2015-08-18 NOTE — Progress Notes (Signed)
Carelink summary report received. Battery status OK. Normal device function. No new symptom episodes, tachy episodes, brady, or pause episodes. No new AF episodes. Monthly summary reports and ROV with [facility] in 10/2015. 

## 2015-08-21 ENCOUNTER — Ambulatory Visit (INDEPENDENT_AMBULATORY_CARE_PROVIDER_SITE_OTHER): Payer: Medicare Other | Admitting: *Deleted

## 2015-08-21 DIAGNOSIS — R55 Syncope and collapse: Secondary | ICD-10-CM | POA: Diagnosis not present

## 2015-08-23 NOTE — Progress Notes (Signed)
LOOP RECORDER  

## 2015-09-19 ENCOUNTER — Ambulatory Visit (INDEPENDENT_AMBULATORY_CARE_PROVIDER_SITE_OTHER): Payer: Medicare Other | Admitting: *Deleted

## 2015-09-19 DIAGNOSIS — R55 Syncope and collapse: Secondary | ICD-10-CM | POA: Diagnosis not present

## 2015-09-19 NOTE — Progress Notes (Signed)
Carelink Summary Report / Loop Recorder 

## 2015-10-01 LAB — CUP PACEART REMOTE DEVICE CHECK: Date Time Interrogation Session: 20161124201056

## 2015-10-16 ENCOUNTER — Ambulatory Visit (INDEPENDENT_AMBULATORY_CARE_PROVIDER_SITE_OTHER): Payer: Medicare Other | Admitting: *Deleted

## 2015-10-16 DIAGNOSIS — R55 Syncope and collapse: Secondary | ICD-10-CM

## 2015-10-17 NOTE — Progress Notes (Signed)
Carelink Summary Report / Loop Recorder 

## 2015-10-28 LAB — CUP PACEART REMOTE DEVICE CHECK: Date Time Interrogation Session: 20161224200918

## 2015-11-15 ENCOUNTER — Ambulatory Visit (INDEPENDENT_AMBULATORY_CARE_PROVIDER_SITE_OTHER): Payer: Medicare Other | Admitting: *Deleted

## 2015-11-15 DIAGNOSIS — R55 Syncope and collapse: Secondary | ICD-10-CM | POA: Diagnosis not present

## 2015-11-16 NOTE — Progress Notes (Signed)
Carelink Summary Report / Loop Recorder 

## 2015-11-22 ENCOUNTER — Encounter: Payer: Self-pay | Admitting: *Deleted

## 2015-11-27 LAB — CUP PACEART REMOTE DEVICE CHECK: MDC IDC SESS DTM: 20170123200814

## 2015-11-27 NOTE — Progress Notes (Signed)
Carelink summary report received. Battery status OK. Normal device function. No new symptom episodes, tachy episodes, brady, or pause episodes. No new AF episodes. Monthly summary reports and ROV/PRN 

## 2015-12-04 LAB — CUP PACEART REMOTE DEVICE CHECK: MDC IDC SESS DTM: 20170222201001

## 2015-12-04 NOTE — Progress Notes (Signed)
Carelink summary report received. Battery status OK. Normal device function. No new symptom episodes, tachy episodes, brady, or pause episodes. No new AF episodes. Monthly summary reports and ROV/PRN 

## 2015-12-15 ENCOUNTER — Ambulatory Visit (INDEPENDENT_AMBULATORY_CARE_PROVIDER_SITE_OTHER): Payer: Medicare Other | Admitting: *Deleted

## 2015-12-15 DIAGNOSIS — R55 Syncope and collapse: Secondary | ICD-10-CM | POA: Diagnosis not present

## 2015-12-18 NOTE — Progress Notes (Signed)
Carelink Summary Report / Loop Recorder 

## 2015-12-27 ENCOUNTER — Encounter: Payer: Self-pay | Admitting: Cardiovascular Disease

## 2015-12-27 ENCOUNTER — Ambulatory Visit (INDEPENDENT_AMBULATORY_CARE_PROVIDER_SITE_OTHER): Payer: Medicare Other | Admitting: Cardiovascular Disease

## 2015-12-27 VITALS — BP 124/73 | HR 54 | Ht 67.5 in | Wt 166.2 lb

## 2015-12-27 DIAGNOSIS — Z95818 Presence of other cardiac implants and grafts: Secondary | ICD-10-CM | POA: Diagnosis not present

## 2015-12-27 DIAGNOSIS — R55 Syncope and collapse: Secondary | ICD-10-CM

## 2015-12-27 NOTE — Patient Instructions (Signed)
Your physician recommends that you continue on your current medications as directed. Please refer to the Current Medication list given to you today.  Dr Sallyanne Kuster recommends that you schedule a follow-up appointment in 12 months with a device check. You will receive a reminder letter in the mail two months in advance. If you don't receive a letter, please call our office to schedule the follow-up appointment.  If you need a refill on your cardiac medications before your next appointment, please call your pharmacy.

## 2015-12-27 NOTE — Progress Notes (Signed)
Patient ID: Oscar [name], male   DOB: 1930-10-01, 82 y.o.   MRN: [id number]    Cardiology Office Note    Date:  12/27/2015   ID:  Oscar [name], DOB [birth year], MRN [id number]  PCP:  Phineas Inches, MD  Cardiologist:   Sanda Klein, MD   Chief Complaint  Patient presents with  . Annual Exam    patient reports no problems since last visit.    History of Present Illness:  Oscar [name] is a 82 y.o. male returns for follow-up of loop recorder. He has had previous unusual neurological events and at least one episode of syncope, that remain unexplained. His loop recorder has not shown any evidence of arrhythmia since its implantation, almost 20 months ago. He has not really had any meaningful events since the device was implanted. He activated the device for mild dizziness at one occasion, with a recording of normal sinus rhythm, but he was not particularly symptomatic at that time. He feels well and remains very active for his age. His loop recorder documents roughly 4 hours a day of physical activity. He denies angina, dyspnea, leg edema, new focal neurological events, claudication, palpitations or syncope  In the past he has reportedly had a normal coronary angiogram (10 years ago) and in 2015 had a normal nuclear stress test and a normal echocardiogram. He has treated hyperlipidemia but does not have diabetes or hypertension.  Past Medical History  Diagnosis Date  . Bladder tumor   . GERD (gastroesophageal reflux disease)   . Hyperlipidemia   . History of peptic ulcer 1970'S  . History of concussion X2   NO RESIDUALS  . History of appendectomy   . Status post placement of implantable loop recorder 11/08/2014    Past Surgical History  Procedure Laterality Date  . Transurethral resection of bladder tumor  03-10-2008  . Inguinal hernia repair Right 07-14-2002  . Appendectomy  1970  . Cystoscopy with biopsy N/A 02/10/2013    Procedure: CYSTOSCOPY WITH COLD CUP BIOPSY/FULGERATION;   Surgeon: Bernestine Amass, MD;  Location: [facility];  Service: Urology;  Laterality: N/A;  ALSO FULGERATION   . Cardiovascular stress test  03/22/09  . Loop recorder implant N/A 05/24/2014    Procedure: LOOP RECORDER IMPLANT;  Surgeon: Sanda Klein, MD;  Location: [facility];  Service: Cardiovascular;  Laterality: N/A;    Current Medications: Outpatient Prescriptions Prior to Visit  Medication Sig Dispense Refill  . aspirin EC 81 MG tablet Take 81 mg by mouth daily.    . Glucos-Chondroit-Hyaluron-MSM (GLUCOSAMINE CHONDROITIN JOINT PO) Take 1 tablet by mouth daily.     . Multiple Vitamin (MULTIVITAMIN) capsule Take 1 capsule by mouth daily.    . Naproxen Sodium (ALEVE) 220 MG CAPS Take 1 tablet by mouth 2 (two) times daily as needed (for pain).     Marland Kitchen omeprazole (PRILOSEC) 20 MG capsule Take 20 mg by mouth every morning.    . simvastatin (ZOCOR) 20 MG tablet Take 20 mg by mouth every evening.     No facility-administered medications prior to visit.     Allergies:   Review of patient's allergies indicates no known allergies.   Social History   Social History  . Marital Status: Married    Spouse Name: N/A  . Number of Children: N/A  . Years of Education: N/A   Social History Main Topics  . Smoking status: Former Smoker -- 0.50 packs/day for 30 years    Types: Cigarettes  Quit date: 02/06/1979  . Smokeless tobacco: Never Used  . Alcohol Use: No  . Drug Use: No  . Sexual Activity: Not Asked   Other Topics Concern  . None   Social History Narrative     Family History:  The patient's family history includes Diabetes in his brother; Heart failure in his brother and mother; Stroke in his father.   ROS:   Please see the history of present illness.    ROS All other systems reviewed and are negative.   PHYSICAL EXAM:   VS:  BP 124/73 mmHg  Pulse 54  Ht 5' 7.5" (1.715 m)  Wt 75.388 kg (166 lb 3.2 oz)  BMI 25.63 kg/m2   GEN: Well nourished, well developed,  in no acute distress HEENT: normal Neck: no JVD, carotid bruits, or masses Cardiac: RRR; no murmurs, rubs, or gallops,no edema  Respiratory:  clear to auscultation bilaterally, normal work of breathing GI: soft, nontender, nondistended, + BS MS: no deformity or atrophy Skin: warm and dry, no rash Neuro:  Alert and Oriented x 3, Strength and sensation are intact Psych: euthymic mood, full affect  Wt Readings from Last 3 Encounters:  12/27/15 75.388 kg (166 lb 3.2 oz)  11/08/14 76.522 kg (168 lb 11.2 oz)  05/24/14 74.844 kg (165 lb)      Studies/Labs Reviewed:   EKG:  EKG is ordered today.  The ekg ordered today demonstrates Sinus rhythm and complete right bundle branch block, QRS 116 ms, normal QT  Recent Labs: No results found for requested labs within last 365 days.   Lipid Panel No results found for: CHOL, TRIG, HDL, CHOLHDL, VLDL, LDLCALC, LDLDIRECT    ASSESSMENT:    1. Near syncope   2. Status post placement of implantable loop recorder      PLAN:  In order of problems listed above:  1. No new clinical episodes 2. Multiple device function, no significant arrhythmia recorded  Continue remote loop recorder monitoring. We'll see him yearly, although he will likely return sooner to our office, accompanying his wife [name].    Medication Adjustments/Labs and Tests Ordered: Current medicines are reviewed at length with the patient today.  Concerns regarding medicines are outlined above.  Medication changes, Labs and Tests ordered today are listed in the Patient Instructions below. Patient Instructions  Your physician recommends that you continue on your current medications as directed. Please refer to the Current Medication list given to you today.  Dr Sallyanne Kuster recommends that you schedule a follow-up appointment in 12 months with a device check. You will receive a reminder letter in the mail two months in advance. If you don't receive a letter, please call our  office to schedule the follow-up appointment.  If you need a refill on your cardiac medications before your next appointment, please call your pharmacy.     Mikael Spray, MD  12/27/2015 1:28 PM    [facility] [address], [place], [place]  [address] Phone: [phone]; Fax: [phone]

## 2016-01-15 ENCOUNTER — Ambulatory Visit (INDEPENDENT_AMBULATORY_CARE_PROVIDER_SITE_OTHER): Payer: Medicare Other | Admitting: *Deleted

## 2016-01-15 DIAGNOSIS — R55 Syncope and collapse: Secondary | ICD-10-CM

## 2016-01-15 NOTE — Progress Notes (Signed)
Carelink Summary Report / Loop Recorder 

## 2016-02-13 ENCOUNTER — Ambulatory Visit (INDEPENDENT_AMBULATORY_CARE_PROVIDER_SITE_OTHER): Payer: Medicare Other | Admitting: *Deleted

## 2016-02-13 DIAGNOSIS — R55 Syncope and collapse: Secondary | ICD-10-CM

## 2016-02-14 NOTE — Progress Notes (Signed)
Carelink Summary Report / Loop Recorder 

## 2016-02-17 LAB — CUP PACEART REMOTE DEVICE CHECK: MDC IDC SESS DTM: 20170324203826

## 2016-02-17 NOTE — Progress Notes (Signed)
Carelink summary report received. Battery status OK. Normal device function. No new symptom episodes, tachy episodes, brady, or pause episodes. No new AF episodes. Monthly summary reports and ROV/PRN 

## 2016-02-19 LAB — CUP PACEART REMOTE DEVICE CHECK: Date Time Interrogation Session: 20170423210927

## 2016-02-19 NOTE — Progress Notes (Signed)
Carelink summary report received. Battery status OK. Normal device function. No new symptom episodes, tachy episodes, brady, or pause episodes. No new AF episodes. Monthly summary reports and ROV/PRN 

## 2016-03-14 ENCOUNTER — Ambulatory Visit (INDEPENDENT_AMBULATORY_CARE_PROVIDER_SITE_OTHER): Payer: Medicare Other | Admitting: *Deleted

## 2016-03-14 DIAGNOSIS — R55 Syncope and collapse: Secondary | ICD-10-CM | POA: Diagnosis not present

## 2016-03-15 NOTE — Progress Notes (Signed)
Carelink Summary Report / Loop Recorder 

## 2016-03-20 LAB — CUP PACEART REMOTE DEVICE CHECK: Date Time Interrogation Session: 20170622213843

## 2016-04-15 ENCOUNTER — Ambulatory Visit (INDEPENDENT_AMBULATORY_CARE_PROVIDER_SITE_OTHER): Payer: Medicare Other | Admitting: *Deleted

## 2016-04-15 DIAGNOSIS — R55 Syncope and collapse: Secondary | ICD-10-CM

## 2016-04-15 NOTE — Progress Notes (Signed)
Carelink Summary Report / Loop Recorder 

## 2016-04-23 LAB — CUP PACEART REMOTE DEVICE CHECK: Date Time Interrogation Session: 20170722224141

## 2016-05-13 ENCOUNTER — Ambulatory Visit (INDEPENDENT_AMBULATORY_CARE_PROVIDER_SITE_OTHER): Payer: Medicare Other | Admitting: *Deleted

## 2016-05-13 DIAGNOSIS — R55 Syncope and collapse: Secondary | ICD-10-CM | POA: Diagnosis not present

## 2016-05-14 NOTE — Progress Notes (Signed)
Carelink Summary Report / Loop Recorder 

## 2016-05-20 DIAGNOSIS — Z0000 Encounter for general adult medical examination without abnormal findings: Secondary | ICD-10-CM

## 2016-05-20 HISTORY — DX: Encounter for general adult medical examination without abnormal findings: Z00.00

## 2016-06-10 LAB — CUP PACEART REMOTE DEVICE CHECK: Date Time Interrogation Session: 20170821223856

## 2016-06-10 NOTE — Progress Notes (Signed)
Carelink summary report received. Battery status OK. Normal device function. No new symptom episodes, tachy episodes, brady, or pause episodes. No new AF episodes. Monthly summary reports and ROV/PRN 

## 2016-06-12 ENCOUNTER — Ambulatory Visit (INDEPENDENT_AMBULATORY_CARE_PROVIDER_SITE_OTHER): Payer: Medicare Other | Admitting: *Deleted

## 2016-06-12 DIAGNOSIS — R55 Syncope and collapse: Secondary | ICD-10-CM

## 2016-06-13 NOTE — Progress Notes (Signed)
Carelink Summary Report / Loop Recorder 

## 2016-07-06 LAB — CUP PACEART REMOTE DEVICE CHECK: Date Time Interrogation Session: 20170920230820

## 2016-07-06 NOTE — Progress Notes (Signed)
Carelink summary report received. Battery status OK. Normal device function. No new symptom episodes, tachy episodes, brady, or pause episodes. No new AF episodes. Monthly summary reports and ROV/PRN 

## 2016-07-12 ENCOUNTER — Ambulatory Visit (INDEPENDENT_AMBULATORY_CARE_PROVIDER_SITE_OTHER): Payer: Medicare Other | Admitting: *Deleted

## 2016-07-12 DIAGNOSIS — R55 Syncope and collapse: Secondary | ICD-10-CM

## 2016-07-15 NOTE — Progress Notes (Signed)
Carelink Summary Report / Loop Recorder 

## 2016-08-10 LAB — CUP PACEART REMOTE DEVICE CHECK
Date Time Interrogation Session: 20171021001000
Implantable Pulse Generator Implant Date: 20150901

## 2016-08-10 NOTE — Progress Notes (Signed)
Carelink summary report received. Battery status OK. Normal device function. No new symptom episodes, tachy episodes, brady, or pause episodes. No new AF episodes. Monthly summary reports and ROV/PRN 

## 2016-08-12 ENCOUNTER — Ambulatory Visit (INDEPENDENT_AMBULATORY_CARE_PROVIDER_SITE_OTHER): Payer: Medicare Other | Admitting: *Deleted

## 2016-08-12 DIAGNOSIS — R55 Syncope and collapse: Secondary | ICD-10-CM | POA: Diagnosis not present

## 2016-08-12 NOTE — Progress Notes (Signed)
Carelink Summary Report / Loop Recorder 

## 2016-09-10 ENCOUNTER — Ambulatory Visit (INDEPENDENT_AMBULATORY_CARE_PROVIDER_SITE_OTHER): Payer: Medicare Other | Admitting: *Deleted

## 2016-09-10 DIAGNOSIS — R55 Syncope and collapse: Secondary | ICD-10-CM

## 2016-09-11 NOTE — Progress Notes (Signed)
Carelink Summary Report / Loop Recorder 

## 2016-09-18 DIAGNOSIS — Z9842 Cataract extraction status, left eye: Secondary | ICD-10-CM

## 2016-09-18 DIAGNOSIS — Z9841 Cataract extraction status, right eye: Secondary | ICD-10-CM | POA: Insufficient documentation

## 2016-09-18 DIAGNOSIS — M21611 Bunion of right foot: Secondary | ICD-10-CM

## 2016-09-18 HISTORY — DX: Cataract extraction status, right eye: Z98.41

## 2016-09-18 HISTORY — DX: Bunion of right foot: M21.611

## 2016-09-18 HISTORY — DX: Cataract extraction status, left eye: Z98.42

## 2016-09-24 LAB — CUP PACEART REMOTE DEVICE CHECK
Date Time Interrogation Session: 20171120010805
MDC IDC PG IMPLANT DT: 20150901

## 2016-10-10 ENCOUNTER — Ambulatory Visit (INDEPENDENT_AMBULATORY_CARE_PROVIDER_SITE_OTHER): Payer: Medicare Other | Admitting: *Deleted

## 2016-10-10 DIAGNOSIS — R55 Syncope and collapse: Secondary | ICD-10-CM

## 2016-10-11 NOTE — Progress Notes (Signed)
Carelink Summary Report / Loop Recorder 

## 2016-10-29 LAB — CUP PACEART REMOTE DEVICE CHECK
Date Time Interrogation Session: 20171220013917
MDC IDC PG IMPLANT DT: 20150901

## 2016-11-11 ENCOUNTER — Ambulatory Visit (INDEPENDENT_AMBULATORY_CARE_PROVIDER_SITE_OTHER): Payer: Medicare Other | Admitting: *Deleted

## 2016-11-11 DIAGNOSIS — R55 Syncope and collapse: Secondary | ICD-10-CM

## 2016-11-12 NOTE — Progress Notes (Signed)
Carelink Summary Report / Loop Recorder 

## 2016-11-14 LAB — CUP PACEART REMOTE DEVICE CHECK
Date Time Interrogation Session: 20180119023723
Implantable Pulse Generator Implant Date: 20150901

## 2016-12-03 LAB — CUP PACEART REMOTE DEVICE CHECK
Implantable Pulse Generator Implant Date: 20150901
MDC IDC SESS DTM: 20180218031313

## 2016-12-09 ENCOUNTER — Ambulatory Visit (INDEPENDENT_AMBULATORY_CARE_PROVIDER_SITE_OTHER): Payer: Medicare Other | Admitting: *Deleted

## 2016-12-09 DIAGNOSIS — R55 Syncope and collapse: Secondary | ICD-10-CM

## 2016-12-10 NOTE — Progress Notes (Signed)
Carelink Summary Report / Loop Recorder 

## 2016-12-22 LAB — CUP PACEART REMOTE DEVICE CHECK
Date Time Interrogation Session: 20180320034401
MDC IDC PG IMPLANT DT: 20150901

## 2016-12-22 NOTE — Progress Notes (Signed)
Carelink summary report received. Battery status OK. Normal device function. No new symptom episodes, tachy episodes, brady, or pause episodes. No new AF episodes. Monthly summary reports and ROV/PRN 

## 2017-01-08 ENCOUNTER — Ambulatory Visit (INDEPENDENT_AMBULATORY_CARE_PROVIDER_SITE_OTHER): Payer: Medicare Other | Admitting: *Deleted

## 2017-01-08 DIAGNOSIS — R55 Syncope and collapse: Secondary | ICD-10-CM

## 2017-01-09 NOTE — Progress Notes (Signed)
Carelink Summary Report / Loop Recorder 

## 2017-01-21 LAB — CUP PACEART REMOTE DEVICE CHECK
Date Time Interrogation Session: 20180419033856
Implantable Pulse Generator Implant Date: 20150901

## 2017-02-06 ENCOUNTER — Encounter (HOSPITAL_BASED_OUTPATIENT_CLINIC_OR_DEPARTMENT_OTHER): Payer: Self-pay | Admitting: Emergency Medicine

## 2017-02-06 ENCOUNTER — Emergency Department (HOSPITAL_BASED_OUTPATIENT_CLINIC_OR_DEPARTMENT_OTHER): Payer: Medicare Other

## 2017-02-06 ENCOUNTER — Emergency Department (HOSPITAL_BASED_OUTPATIENT_CLINIC_OR_DEPARTMENT_OTHER)
Admission: EM | Admit: 2017-02-06 | Discharge: 2017-02-06 | Disposition: A | Discharge status: Home / Self Care | Payer: Medicare Other | Attending: Emergency Medicine | Admitting: Emergency Medicine

## 2017-02-06 DIAGNOSIS — Z87891 Personal history of nicotine dependence: Secondary | ICD-10-CM | POA: Insufficient documentation

## 2017-02-06 DIAGNOSIS — S79921A Unspecified injury of right thigh, initial encounter: Secondary | ICD-10-CM | POA: Diagnosis present

## 2017-02-06 DIAGNOSIS — Z8551 Personal history of malignant neoplasm of bladder: Secondary | ICD-10-CM | POA: Diagnosis not present

## 2017-02-06 DIAGNOSIS — S76911A Strain of unspecified muscles, fascia and tendons at thigh level, right thigh, initial encounter: Secondary | ICD-10-CM | POA: Insufficient documentation

## 2017-02-06 DIAGNOSIS — Z95818 Presence of other cardiac implants and grafts: Secondary | ICD-10-CM | POA: Insufficient documentation

## 2017-02-06 DIAGNOSIS — Z7982 Long term (current) use of aspirin: Secondary | ICD-10-CM | POA: Diagnosis not present

## 2017-02-06 DIAGNOSIS — Y939 Activity, unspecified: Secondary | ICD-10-CM | POA: Insufficient documentation

## 2017-02-06 DIAGNOSIS — Z79899 Other long term (current) drug therapy: Secondary | ICD-10-CM | POA: Diagnosis not present

## 2017-02-06 DIAGNOSIS — Y999 Unspecified external cause status: Secondary | ICD-10-CM | POA: Diagnosis not present

## 2017-02-06 DIAGNOSIS — X500XXA Overexertion from strenuous movement or load, initial encounter: Secondary | ICD-10-CM | POA: Diagnosis not present

## 2017-02-06 DIAGNOSIS — R52 Pain, unspecified: Secondary | ICD-10-CM

## 2017-02-06 DIAGNOSIS — Y929 Unspecified place or not applicable: Secondary | ICD-10-CM | POA: Diagnosis not present

## 2017-02-06 NOTE — Discharge Instructions (Signed)
Please read instructions below. Can take Tylenol as needed for pain.  Apply ice to your leg for 20 minutes at a time.  Avoid strenuous activity for the next few days. You can increase your activity level as tolerated. Follow-up with your primary care provider, as needed if symptoms do not improve. You can also follow up with orthopedics, as needed.  Return to the ER for new or worsening symptoms.

## 2017-02-06 NOTE — ED Triage Notes (Signed)
Patient states that he was bending over the trunk earlier today and felt a pull at the back of his right knee. Walks with a limp

## 2017-02-06 NOTE — ED Provider Notes (Signed)
[facility] DEPT [facility] Provider Note   CSN: [id number] Arrival date & time: 02/06/17  1759  By signing my name below, I, Collene Leyden, attest that this documentation has been prepared under the direction and in the presence of Martinique Russo, PA-C. Electronically Signed: Collene Leyden, Scribe. 02/06/17. 9:12 PM.   History   Chief Complaint Chief Complaint  Patient presents with  . Knee Pain   HPI Comments: Oscar [name] is a 83 y.o. male with a history of a bladder cancer, GERD, and hyperlipidemia, who presents to the Emergency Department complaining of sudden-onset, constant right posterior thigh pain that began earlier today. Patient states he was bending over the trunk earlier today, when he felt a "pull" in the posterior right thigh. Patient states he may have pulled his hamstring muscle, as it feels similar to previous muscle strains. Patient states his pain has gradually improved throughout the day. No modifying factors indicated. Patient describes his pain as sharp and constant, worse with movement. Patient denies any DVT history. Patient is currently taking aspirin daily. Patient denies any numbness, weakness, nausea, vomiting fever, or chills.   The history is provided by the patient. No language interpreter was used.    Past Medical History:  Diagnosis Date  . Bladder tumor   . GERD (gastroesophageal reflux disease)   . History of appendectomy   . History of concussion X2   NO RESIDUALS  . History of peptic ulcer 1970'S  . Hyperlipidemia   . Status post placement of implantable loop recorder 11/08/2014    Patient Active Problem List   Diagnosis Date Noted  . Status post placement of implantable loop recorder 11/08/2014  . Near syncope 05/16/2014  . Cancer of bladder ([place]) 02/10/2013    Past Surgical History:  Procedure Laterality Date  . APPENDECTOMY  1970  . CARDIOVASCULAR STRESS TEST  03/22/09  . CYSTOSCOPY WITH BIOPSY N/A 02/10/2013   Procedure: CYSTOSCOPY  WITH COLD CUP BIOPSY/FULGERATION;  Surgeon: Bernestine Amass, MD;  Location: [facility];  Service: Urology;  Laterality: N/A;  ALSO FULGERATION   . INGUINAL HERNIA REPAIR Right 07-14-2002  . LOOP RECORDER IMPLANT N/A 05/24/2014   Procedure: LOOP RECORDER IMPLANT;  Surgeon: Sanda Klein, MD;  Location: [facility]ATH [facility];  Service: Cardiovascular;  Laterality: N/A;  . TRANSURETHRAL RESECTION OF BLADDER TUMOR  03-10-2008       Home Medications    Prior to Admission medications   Medication Sig Start Date End Date Taking? Authorizing Provider  aspirin EC 81 MG tablet Take 81 mg by mouth daily.    [provider]  atorvastatin (LIPITOR) 20 MG tablet Take 1 tablet by mouth every other day. 12/20/15   [provider]  Glucos-Chondroit-Hyaluron-MSM (GLUCOSAMINE CHONDROITIN JOINT PO) Take 1 tablet by mouth daily.     [provider]  Multiple Vitamin (MULTIVITAMIN) capsule Take 1 capsule by mouth daily.    [provider]  Naproxen Sodium (ALEVE) 220 MG CAPS Take 1 tablet by mouth 2 (two) times daily as needed (for pain).     [provider]  omeprazole (PRILOSEC) 20 MG capsule Take 20 mg by mouth every morning.    [provider]  tamsulosin (FLOMAX) 0.4 MG CAPS capsule Take 1 capsule by mouth daily. 11/28/15   [provider]    Family History Family History  Problem Relation Age of Onset  . Heart failure Mother   . Stroke Father   . Diabetes Brother   . Heart failure  Brother     Social History Social History  Substance Use Topics  . Smoking status: Former Smoker    Packs/day: 0.50    Years: 30.00    Types: Cigarettes    Quit date: 02/06/1979  . Smokeless tobacco: Never Used  . Alcohol use No     Allergies   Patient has no known allergies.   Review of Systems Review of Systems  Constitutional: Negative for fever.  Respiratory: Negative for cough and shortness of breath.   Musculoskeletal: Positive for  myalgias (Right posterior thigh). Negative for arthralgias (No knee pain) and joint swelling.  Skin: Negative for wound.     Physical Exam Updated Vital Signs BP 126/76 (BP Location: Right Arm)   Pulse 65   Temp 98.3 F (36.8 C) (Oral)   Resp 16   Ht 5\' 7"  (1.702 m)   Wt 160 lb (72.6 kg)   SpO2 100%   BMI 25.06 kg/m   Physical Exam  Constitutional: He appears well-developed and well-nourished.  HENT:  Head: Normocephalic and atraumatic.  Eyes: Conjunctivae are normal.  Cardiovascular: Normal rate.   Pulmonary/Chest: Effort normal.  Musculoskeletal: Normal range of motion. He exhibits tenderness. He exhibits no edema or deformity.  Posterior right thigh TTP. AROM extension and flexion of the right knee intact. Knee is stable. Posterior thigh and knee are not erythematous or warm. No tenderness posterior knee or calf. No gross deformities  Neurological: No sensory deficit.  Patient ambulating with slight limp favoring right, 2/t pain.  Psychiatric: He has a normal mood and affect. His behavior is normal.  Nursing note and vitals reviewed.    ED Treatments / Results  DIAGNOSTIC STUDIES: Oxygen Saturation is 99% on RA, normal by my interpretation.    COORDINATION OF CARE: 9:10 PM Discussed treatment plan with pt at bedside and pt agreed to plan, which includes applying ice, anti-inflammatory pain medication, and rest.   Labs (all labs ordered are listed, but only abnormal results are displayed) Labs Reviewed - No data to display  EKG  EKG Interpretation None       Radiology Dg Knee Complete 4 Views Right  Result Date: 02/06/2017 CLINICAL DATA:  Rt knee stabbing pain posterior knee radiates to posterior femur sudden onset x today. No old or recent injury known. EXAM: RIGHT KNEE - COMPLETE 4+ VIEW COMPARISON:  05/15/2005 FINDINGS: No fracture or bone lesion. Mild medial joint space compartment narrowing. No other degenerative change. No joint effusion. Soft tissues  are unremarkable. IMPRESSION: 1. No fracture, bone lesion or acute finding. 2. Mild medial joint space compartment osteoarthritis. Electronically Signed   By: Lajean Manes M.D.   On: 02/06/2017 18:45    Procedures Procedures (including critical care time)  Medications Ordered in ED Medications - No data to display   Initial Impression / Assessment and Plan / ED Course  I have reviewed the triage vital signs and the nursing notes.  Pertinent labs & imaging results that were available during my care of the patient were reviewed by me and considered in my medical decision making (see chart for details).    Patient with muscle strain to posterior right thigh. 2/t mechanism of injury, likely hamstring strain. Doubt DVT, leg is not red or warm; mechanism of injury indicates muscle strain more likely. Patient with intact active flexion and extension of knee, knee is stable, NV intact. X-ray of the knee without acute pathology. Ambulating with a slight limp favoring the right. Will discharge home with symptomatic  management. PCP follow-up if symptoms do not improve. Orthopedic referral given as needed if symptoms do not improve. Patient safe for discharge home.  Patient discussed with and seen by Dr. Sherry Ruffing. Discussed results, findings, treatment and follow up. Patient advised of return precautions. Patient verbalized understanding and agreed with plan.  Final Clinical Impressions(s) / ED Diagnoses   Final diagnoses:  Muscle strain of thigh, right, initial encounter    New Prescriptions Discharge Medication List as of 02/06/2017  9:20 PM     I personally performed the services described in this documentation, which was scribed in my presence. The recorded information has been reviewed and is accurate.     Russo, [place] N, PA-C 02/06/17 2130    Tegeler, Gwenyth Allegra, MD 02/08/17 [phone]

## 2017-02-07 ENCOUNTER — Ambulatory Visit (INDEPENDENT_AMBULATORY_CARE_PROVIDER_SITE_OTHER): Payer: Medicare Other | Admitting: *Deleted

## 2017-02-07 DIAGNOSIS — R55 Syncope and collapse: Secondary | ICD-10-CM

## 2017-02-10 NOTE — Progress Notes (Signed)
Carelink Summary Report / Loop Recorder 

## 2017-02-17 LAB — CUP PACEART REMOTE DEVICE CHECK
Date Time Interrogation Session: 20180519033913
Implantable Pulse Generator Implant Date: 20150901

## 2017-03-10 ENCOUNTER — Ambulatory Visit (INDEPENDENT_AMBULATORY_CARE_PROVIDER_SITE_OTHER): Payer: Medicare Other | Admitting: *Deleted

## 2017-03-10 DIAGNOSIS — R55 Syncope and collapse: Secondary | ICD-10-CM | POA: Diagnosis not present

## 2017-03-10 NOTE — Progress Notes (Signed)
Carelink Summary Report / Loop Recorder 

## 2017-03-18 LAB — CUP PACEART REMOTE DEVICE CHECK
Date Time Interrogation Session: 20180618040933
Implantable Pulse Generator Implant Date: 20150901

## 2017-03-19 ENCOUNTER — Telehealth: Payer: Self-pay | Admitting: Cardiovascular Disease

## 2017-03-19 NOTE — Telephone Encounter (Signed)
Closed encounter °

## 2017-04-09 ENCOUNTER — Ambulatory Visit (INDEPENDENT_AMBULATORY_CARE_PROVIDER_SITE_OTHER): Payer: Medicare Other | Admitting: *Deleted

## 2017-04-09 DIAGNOSIS — R55 Syncope and collapse: Secondary | ICD-10-CM | POA: Diagnosis not present

## 2017-04-09 NOTE — Progress Notes (Signed)
Carelink Summary Report / Loop Recorder 

## 2017-04-22 LAB — CUP PACEART REMOTE DEVICE CHECK
Date Time Interrogation Session: 20180718122436
MDC IDC PG IMPLANT DT: 20150901

## 2017-04-22 NOTE — Progress Notes (Signed)
Carelink summary report received. Battery status OK. Normal device function. No new symptom episodes, tachy episodes, brady, or pause episodes. No new AF episodes. Monthly summary reports and ROV/PRN 

## 2017-05-09 ENCOUNTER — Ambulatory Visit (INDEPENDENT_AMBULATORY_CARE_PROVIDER_SITE_OTHER): Payer: Medicare Other | Admitting: *Deleted

## 2017-05-09 DIAGNOSIS — R55 Syncope and collapse: Secondary | ICD-10-CM | POA: Diagnosis not present

## 2017-05-09 NOTE — Progress Notes (Signed)
Carelink Summary Report 

## 2017-05-16 LAB — CUP PACEART REMOTE DEVICE CHECK
Implantable Pulse Generator Implant Date: 20150901
MDC IDC SESS DTM: 20180817123945

## 2017-06-09 ENCOUNTER — Ambulatory Visit (INDEPENDENT_AMBULATORY_CARE_PROVIDER_SITE_OTHER): Payer: Medicare Other | Admitting: Cardiovascular Disease

## 2017-06-09 ENCOUNTER — Ambulatory Visit (INDEPENDENT_AMBULATORY_CARE_PROVIDER_SITE_OTHER): Payer: Medicare Other | Admitting: *Deleted

## 2017-06-09 ENCOUNTER — Encounter: Payer: Self-pay | Admitting: Cardiovascular Disease

## 2017-06-09 VITALS — BP 110/76 | HR 60 | Ht 68.0 in | Wt 164.0 lb

## 2017-06-09 DIAGNOSIS — E7800 Pure hypercholesterolemia, unspecified: Secondary | ICD-10-CM | POA: Diagnosis not present

## 2017-06-09 DIAGNOSIS — I447 Left bundle-branch block, unspecified: Secondary | ICD-10-CM | POA: Diagnosis not present

## 2017-06-09 DIAGNOSIS — Z9889 Other specified postprocedural states: Secondary | ICD-10-CM

## 2017-06-09 DIAGNOSIS — R55 Syncope and collapse: Secondary | ICD-10-CM | POA: Diagnosis not present

## 2017-06-09 NOTE — Progress Notes (Signed)
Patient ID: Oscar [name], male   DOB: Mar 21, 1931, 83 y.o.   MRN: [id number]    Cardiology Office Note    Date:  06/11/2017   ID:  Oscar [name], DOB [birth year], MRN [id number]  PCP:  Bernerd Limbo, MD  Cardiologist:   Sanda Klein, MD   Chief Complaint  Patient presents with  . Follow-up    no cheest pain, shortness of breath with exertion, no edema, occassional pain & cramping in legs, occassional lightheaded or dizziness    History of Present Illness:  Oscar [name] is a 83 y.o. male returns for follow-up of loop recorder. In the 3 years passed since device implantation he has not had full-blown syncope or even a serious episode of near-syncope. The device has not recorded any rhythm abnormalities.  He does have underlying conduction system disease with both a long PR interval and left bundle branch block. His ECG in 2015 showed right bundle branch block. However, he has never had evidence of higher grade AV block and has no structural heart disease.  He remains physically active and essentially asymptomatic. Specifically denies angina, dyspnea on exertion, palpitations, focal neurological events, intermittent claudication, leg edema or other cardiovascular complaints. He has mild occasional orthostatic dizziness. He is compliant with statin therapy for hyperlipidemia.  In the past he has reportedly had a normal coronary angiogram (10 years ago) and in 2015 had a normal nuclear stress test and a normal echocardiogram. He has treated hyperlipidemia but does not have diabetes or hypertension.  Past Medical History:  Diagnosis Date  . Bladder tumor   . GERD (gastroesophageal reflux disease)   . History of appendectomy   . History of concussion X2   NO RESIDUALS  . History of peptic ulcer 1970'S  . Hyperlipidemia   . Status post placement of implantable loop recorder 11/08/2014    Past Surgical History:  Procedure Laterality Date  . APPENDECTOMY  1970  . CARDIOVASCULAR STRESS  TEST  03/22/09  . CYSTOSCOPY WITH BIOPSY N/A 02/10/2013   Procedure: CYSTOSCOPY WITH COLD CUP BIOPSY/FULGERATION;  Surgeon: Bernestine Amass, MD;  Location: [facility];  Service: Urology;  Laterality: N/A;  ALSO FULGERATION   . INGUINAL HERNIA REPAIR Right 07-14-2002  . LOOP RECORDER IMPLANT N/A 05/24/2014   Procedure: LOOP RECORDER IMPLANT;  Surgeon: Sanda Klein, MD;  Location: [facility];  Service: Cardiovascular;  Laterality: N/A;  . TRANSURETHRAL RESECTION OF BLADDER TUMOR  03-10-2008    Current Medications: Outpatient Medications Prior to Visit  Medication Sig Dispense Refill  . aspirin EC 81 MG tablet Take 81 mg by mouth daily.    Marland Kitchen atorvastatin (LIPITOR) 20 MG tablet Take 1 tablet by mouth every other day.    . Glucos-Chondroit-Hyaluron-MSM (GLUCOSAMINE CHONDROITIN JOINT PO) Take 2 tablets by mouth daily.     . Naproxen Sodium (ALEVE) 220 MG CAPS Take 1 tablet by mouth as needed (for pain).     Marland Kitchen omeprazole (PRILOSEC) 20 MG capsule Take 20 mg by mouth every morning.    . tamsulosin (FLOMAX) 0.4 MG CAPS capsule Take 1 capsule by mouth daily.    . Multiple Vitamin (MULTIVITAMIN) capsule Take 1 capsule by mouth daily.     No facility-administered medications prior to visit.      Allergies:   Patient has no known allergies.   Social History   Social History  . Marital status: Married    Spouse name: N/A  . Number of children: N/A  . Years  of education: N/A   Social History Main Topics  . Smoking status: Former Smoker    Packs/day: 0.50    Years: 30.00    Types: Cigarettes    Quit date: 02/06/1979  . Smokeless tobacco: Never Used  . Alcohol use No  . Drug use: No  . Sexual activity: Not Asked   Other Topics Concern  . None   Social History Narrative  . None     Family History:  The patient's family history includes Diabetes in his brother; Heart failure in his brother and mother; Stroke in his father.   ROS:   Please see the history of present  illness.    ROS All other systems reviewed and are negative.   PHYSICAL EXAM:   VS:  BP 110/76   Pulse 60   Ht 5\' 8"  (1.727 m)   Wt 164 lb (74.4 kg)   BMI 24.94 kg/m     General: Alert, oriented x3, no distress, lean and fit Head: no evidence of trauma, PERRL, EOMI, no exophtalmos or lid lag, no myxedema, no xanthelasma; normal ears, nose and oropharynx Neck: normal jugular venous pulsations and no hepatojugular reflux; brisk carotid pulses without delay and no carotid bruits Chest: clear to auscultation, no signs of consolidation by percussion or palpation, normal fremitus, symmetrical and full respiratory excursions. Healthy loop recorder site Cardiovascular: normal position and quality of the apical impulse, regular rhythm, normal first and second heart sounds, no murmurs, rubs or gallops Abdomen: no tenderness or distention, no masses by palpation, no abnormal pulsatility or arterial bruits, normal bowel sounds, no hepatosplenomegaly Extremities: no clubbing, cyanosis or edema; 2+ radial, ulnar and brachial pulses bilaterally; 2+ right femoral, posterior tibial and dorsalis pedis pulses; 2+ left femoral, posterior tibial and dorsalis pedis pulses; no subclavian or femoral bruits Neurological: grossly nonfocal Psych: Normal mood and affect   Wt Readings from Last 3 Encounters:  06/09/17 164 lb (74.4 kg)  02/06/17 160 lb (72.6 kg)  12/27/15 166 lb 3.2 oz (75.4 kg)      Studies/Labs Reviewed:   EKG:  EKG is ordered today.  The ekg ordered today Sinus rhythm with first-degree AV block, PR 234 ms), left bundle branch block (130 ms), QTC 422 ms  ASSESSMENT:    1. LBBB (left bundle branch block)   2. History of loop recorder   3. Hypercholesterolemia      PLAN:  In order of problems listed above:  1. LBBB: Years ago his ECG showed right bundle branch block. He now has left bundle branch block and a long PR interval. He is at risk for development of secondary from third  degree AV block. Syncope or near syncope should be reported promptly since he would then require a pacemaker. 2. Loop recorder: Has just about reached end of battery life. Will stop monitoring. He does not particularly want it removed. 3. HLP: On statin, followed by PCP     Medication Adjustments/Labs and Tests Ordered: Current medicines are reviewed at length with the patient today.  Concerns regarding medicines are outlined above.  Medication changes, Labs and Tests ordered today are listed in the Patient Instructions below. Patient Instructions  Dr Sallyanne Kuster recommends that you schedule a follow-up appointment in 12 months. You will receive a reminder letter in the mail two months in advance. If you don't receive a letter, please call our office to schedule the follow-up appointment.  If you need a refill on your cardiac medications before your next appointment, please  call your pharmacy.    Signed, Sanda Klein, MD  06/11/2017 9:11 AM    [facility] [address], [place], [place]  [address] Phone: [phone]; Fax: [phone]

## 2017-06-09 NOTE — Patient Instructions (Signed)
Dr Croitoru recommends that you schedule a follow-up appointment in 12 months. You will receive a reminder letter in the mail two months in advance. If you don't receive a letter, please call our office to schedule the follow-up appointment.  If you need a refill on your cardiac medications before your next appointment, please call your pharmacy. 

## 2017-06-09 NOTE — Progress Notes (Signed)
Carelink Summary Report / Loop Recorder 

## 2017-06-12 LAB — CUP PACEART REMOTE DEVICE CHECK
Implantable Pulse Generator Implant Date: 20150901
MDC IDC SESS DTM: 20180916174234

## 2017-11-20 DIAGNOSIS — E785 Hyperlipidemia, unspecified: Secondary | ICD-10-CM | POA: Insufficient documentation

## 2017-11-20 HISTORY — DX: Hyperlipidemia, unspecified: E78.5

## 2017-12-16 ENCOUNTER — Emergency Department (HOSPITAL_COMMUNITY): Payer: Medicare Other

## 2017-12-16 ENCOUNTER — Encounter (HOSPITAL_BASED_OUTPATIENT_CLINIC_OR_DEPARTMENT_OTHER): Payer: Self-pay

## 2017-12-16 ENCOUNTER — Emergency Department (HOSPITAL_BASED_OUTPATIENT_CLINIC_OR_DEPARTMENT_OTHER)
Admission: EM | Admit: 2017-12-16 | Discharge: 2017-12-16 | Disposition: A | Discharge status: Home / Self Care | Payer: Medicare Other | Attending: Emergency Medicine | Admitting: Emergency Medicine

## 2017-12-16 DIAGNOSIS — R42 Dizziness and giddiness: Secondary | ICD-10-CM | POA: Diagnosis not present

## 2017-12-16 DIAGNOSIS — Z7982 Long term (current) use of aspirin: Secondary | ICD-10-CM | POA: Diagnosis not present

## 2017-12-16 DIAGNOSIS — Z87891 Personal history of nicotine dependence: Secondary | ICD-10-CM | POA: Insufficient documentation

## 2017-12-16 DIAGNOSIS — Z79899 Other long term (current) drug therapy: Secondary | ICD-10-CM | POA: Diagnosis not present

## 2017-12-16 DIAGNOSIS — Z8551 Personal history of malignant neoplasm of bladder: Secondary | ICD-10-CM | POA: Insufficient documentation

## 2017-12-16 DIAGNOSIS — R29898 Other symptoms and signs involving the musculoskeletal system: Secondary | ICD-10-CM

## 2017-12-16 DIAGNOSIS — M4802 Spinal stenosis, cervical region: Secondary | ICD-10-CM | POA: Insufficient documentation

## 2017-12-16 LAB — CBC
HCT: 40.7 % (ref 39.0–52.0)
Hemoglobin: 13.5 g/dL (ref 13.0–17.0)
MCH: 31.1 pg (ref 26.0–34.0)
MCHC: 33.2 g/dL (ref 30.0–36.0)
MCV: 93.8 fL (ref 78.0–100.0)
Platelets: 198 10*3/uL (ref 150–400)
RBC: 4.34 MIL/uL (ref 4.22–5.81)
RDW: 13.3 % (ref 11.5–15.5)
WBC: 5.1 10*3/uL (ref 4.0–10.5)

## 2017-12-16 LAB — DIFFERENTIAL
BASOS ABS: 0 10*3/uL (ref 0.0–0.1)
BASOS PCT: 0 %
Eosinophils Absolute: 0.4 10*3/uL (ref 0.0–0.7)
Eosinophils Relative: 8 %
LYMPHS PCT: 16 %
Lymphs Abs: 0.8 10*3/uL (ref 0.7–4.0)
Monocytes Absolute: 0.5 10*3/uL (ref 0.1–1.0)
Monocytes Relative: 10 %
Neutro Abs: 3.3 10*3/uL (ref 1.7–7.7)
Neutrophils Relative %: 66 %

## 2017-12-16 LAB — APTT: APTT: 29 s (ref 24–36)

## 2017-12-16 LAB — TROPONIN I: Troponin I: <0.03 ng/mL (ref <0.03)

## 2017-12-16 LAB — COMPREHENSIVE METABOLIC PANEL
ALBUMIN: 4 g/dL (ref 3.5–5.0)
ALK PHOS: 53 U/L (ref 38–126)
ALT: 18 U/L (ref 17–63)
AST: 22 U/L (ref 15–41)
Anion gap: 8 (ref 5–15)
BUN: 19 mg/dL (ref 6–20)
CALCIUM: 9.1 mg/dL (ref 8.9–10.3)
CHLORIDE: 105 mmol/L (ref 101–111)
CO2: 24 mmol/L (ref 22–32)
CREATININE: 1.05 mg/dL (ref 0.61–1.24)
GFR calc Af Amer: >60 mL/min (ref >60)
GFR calc non Af Amer: >60 mL/min (ref >60)
GLUCOSE: 77 mg/dL (ref 65–99)
Potassium: 4.5 mmol/L (ref 3.5–5.1)
SODIUM: 137 mmol/L (ref 135–145)
Total Bilirubin: 0.5 mg/dL (ref 0.3–1.2)
Total Protein: 6.5 g/dL (ref 6.5–8.1)

## 2017-12-16 LAB — PROTIME-INR
INR: 0.98
Prothrombin Time: 12.9 seconds (ref 11.4–15.2)

## 2017-12-16 LAB — ETHANOL

## 2017-12-16 MED ORDER — PREDNISONE 20 MG PO TABS: ORAL_TABLET | ORAL | 0 refills | Status: DC

## 2017-12-16 NOTE — ED Notes (Signed)
Swallowing screen- pass; no choking, drooling, or coughing after swallowing sip of water.

## 2017-12-16 NOTE — ED Notes (Signed)
Patient transported to MRI 

## 2017-12-16 NOTE — ED Triage Notes (Signed)
Pt reports not being able to raise left arm since yesterday morning; also reports dizziness. Was seen at [facility] 3 weeks ago and diagnosed with veritgo and is now taking antivert; last taken this morning. Pt denies any pain, SHOB, or syncope.

## 2017-12-16 NOTE — ED Notes (Signed)
Pt requests to go POV for MRI at [facility].

## 2017-12-16 NOTE — ED Notes (Signed)
ED Provider at bedside. 

## 2017-12-16 NOTE — ED Notes (Signed)
Pt arrived in [facility] ED for MR, no room currently for patient but informed still in line for MRI. Pt ambulatory on arrival.

## 2017-12-16 NOTE — Discharge Instructions (Addendum)
Your MRI shows stenosis or narrowing of your neck joints.  This could be resulting in the left arm weakness you are experiencing.  If your weakness becomes worse, especially if rapidly becoming worse, return to the ER immediately.  Otherwise take the steroid prescription as prescribed and follow-up with Dr. Annette Stable in about 1 week.

## 2017-12-16 NOTE — ED Notes (Signed)
MRI informed Pt is here.

## 2017-12-16 NOTE — ED Provider Notes (Signed)
4:07 PM Patient's MRI has been reviewed.  There is no acute intracranial abnormality.  However there is partially visible cervical stenosis.  He is having some neck pain and with his left arm weakness I think it is important to get an MRI of his neck.  Unfortunately this means we will have to order a separate MRI and he will have to wait.  He understands this.  MRI results as below.  Has cervical canal stenosis.  Also has possible small edema or Myelomalacia.  I discussed this with Dr. Annette Stable.  He advises steroid Dosepak.  Follow-up in his office in 1 week.  We discussed return precautions which would include rapidly worsening weakness or other symptoms.  Results for orders placed or performed during the hospital encounter of 12/16/17  Ethanol  Result Value Ref Range   Alcohol, Ethyl (B) <10 <10 mg/dL  Protime-INR  Result Value Ref Range   Prothrombin Time 12.9 11.4 - 15.2 seconds   INR 0.98   APTT  Result Value Ref Range   aPTT 29 24 - 36 seconds  CBC  Result Value Ref Range   WBC 5.1 4.0 - 10.5 K/uL   RBC 4.34 4.22 - 5.81 MIL/uL   Hemoglobin 13.5 13.0 - 17.0 g/dL   HCT 40.7 39.0 - 52.0 %   MCV 93.8 78.0 - 100.0 fL   MCH 31.1 26.0 - 34.0 pg   MCHC 33.2 30.0 - 36.0 g/dL   RDW 13.3 11.5 - 15.5 %   Platelets 198 150 - 400 K/uL  Differential  Result Value Ref Range   Neutrophils Relative % 66 %   Neutro Abs 3.3 1.7 - 7.7 K/uL   Lymphocytes Relative 16 %   Lymphs Abs 0.8 0.7 - 4.0 K/uL   Monocytes Relative 10 %   Monocytes Absolute 0.5 0.1 - 1.0 K/uL   Eosinophils Relative 8 %   Eosinophils Absolute 0.4 0.0 - 0.7 K/uL   Basophils Relative 0 %   Basophils Absolute 0.0 0.0 - 0.1 K/uL  Comprehensive metabolic panel  Result Value Ref Range   Sodium 137 135 - 145 mmol/L   Potassium 4.5 3.5 - 5.1 mmol/L   Chloride 105 101 - 111 mmol/L   CO2 24 22 - 32 mmol/L   Glucose, Bld 77 65 - 99 mg/dL   BUN 19 6 - 20 mg/dL   Creatinine, Ser 1.05 0.61 - 1.24 mg/dL   Calcium 9.1 8.9 - 10.3 mg/dL    Total Protein 6.5 6.5 - 8.1 g/dL   Albumin 4.0 3.5 - 5.0 g/dL   AST 22 15 - 41 U/L   ALT 18 17 - 63 U/L   Alkaline Phosphatase 53 38 - 126 U/L   Total Bilirubin 0.5 0.3 - 1.2 mg/dL   GFR calc non Af Amer >60 >60 mL/min   GFR calc Af Amer >60 >60 mL/min   Anion gap 8 5 - 15  Troponin I  Result Value Ref Range   Troponin I <0.03 <0.03 ng/mL   Mr Brain Wo Contrast  Result Date: 12/16/2017 CLINICAL DATA:  Episodic vertigo over the last several months. Worse today. Arm weakness. EXAM: MRI HEAD WITHOUT CONTRAST TECHNIQUE: Multiplanar, multiecho pulse sequences of the brain and surrounding structures were obtained without intravenous contrast. COMPARISON:  11/01/2013 FINDINGS: Brain: Diffusion imaging does not show any acute or subacute infarction. The brainstem and cerebellum are unremarkable. Cerebral hemispheres show mild generalized atrophy with mild chronic small-vessel ischemic changes of the white matter, fairly typical  for age. No cortical or large vessel territory infarction. No mass lesion, hemorrhage, hydrocephalus or extra-axial collection. CP angle regions are normal. Vascular: Major vessels at the base of the brain show flow. Skull and upper cervical spine: No calvarial abnormality is seen. There appears to be cervical spinal stenosis at the C4-5 level that could be significant. This was not primarily or completely evaluated. Sinuses/Orbits: Clear/normal Other: None IMPRESSION: No acute intracranial finding. Atrophy and mild chronic small-vessel ischemic change of the hemispheric white matter, slightly progressive since 2015. Possibly significant spinal stenosis at the C4-5 level, not primarily or completely evaluated. If there is clinical concern that any of the presentation could be cervicogenic in nature, cervical spine study would be suggested. Electronically Signed   By: Nelson Chimes M.D.   On: 12/16/2017 14:05   Mr Cervical Spine Wo Contrast  Result Date: 12/16/2017 CLINICAL DATA:   Initial evaluation for left-sided radiculopathy. EXAM: MRI CERVICAL SPINE WITHOUT CONTRAST TECHNIQUE: Multiplanar, multisequence MR imaging of the cervical spine was performed. No intravenous contrast was administered. COMPARISON:  Prior CT from 05/15/2005. FINDINGS: Alignment: 3 mm anterolisthesis of C4 on C5, with trace 2 mm retrolisthesis of C6 on C7. Trace anterolisthesis of T1 on T2. Vertebrae: Vertebral body heights are maintained without evidence for acute or chronic fracture. Partial ankylosis of the C3 and C4 vertebral bodies as well as the C5 and C6 vertebral bodies, likely degenerative in nature. Bone marrow signal intensity within normal limits. Subcentimeter benign hemangioma noted within the T1 vertebral body. No worrisome osseous lesions. Chronic reactive endplate changes present about the C4-5 through C6-7 interspaces. Cord: There is question of faint T2/stir signal abnormality within the cervical spinal cord at the level of C4-5 due to stenosis (series 5001, image 8). Signal intensity within the cervical spinal cord otherwise normal. Posterior Fossa, vertebral arteries, paraspinal tissues: Visualized brain and posterior fossa within normal limits. Craniocervical junction normal. Paraspinous and prevertebral soft tissues within normal limits. Normal intravascular flow voids present within the vertebral arteries bilaterally. Disc levels: C2-C3: Mild diffuse disc bulge with osseous ridging and bilateral uncovertebral hypertrophy. Left-sided facet degeneration. No significant canal stenosis. Mild left C3 foraminal narrowing. C3-C4: Chronic degenerative disc osteophyte with partial ankylosis of the posterior C3 and C4 vertebral bodies. Broad posterior osseous ridging flattens and partially effaces the ventral CSF. Mild flattening of the ventral cord without cord signal changes. Mild spinal stenosis. Superimposed left greater than right facet hypertrophy. Moderate to severe bilateral C4 foraminal  narrowing, left greater than right. C4-C5: Chronic 4 mm facet mediated anterolisthesis of C4 on C5. Associated broad posterior disc bulge/pseudo disc bulge. Severe bilateral facet arthropathy, left worse than right. Ligamentum flavum thickening. Resultant severe spinal stenosis with flattening of the cervical spinal cord. Thecal sac measures 6 mm in AP diameter. Severe bilateral C5 foraminal narrowing. C5-C6: Chronic near complete ankylosis of the C5 and C6 vertebral bodies with associated osseous region and bilateral uncovertebral hypertrophy. Broad posterior component flattens and partially effaces the ventral CSF and resultant mild spinal stenosis. Severe bilateral C6 foraminal narrowing. C6-C7: Chronic diffuse degenerative disc osteophyte with intervertebral disc space narrowing and bilateral uncovertebral spurring. Flattening of the ventral CSF with resultant mild spinal stenosis. Moderate to severe bilateral C7 foraminal stenosis. C7-T1: Minimal disc bulge. Moderate bilateral facet hypertrophy. No significant canal stenosis. Bilateral foramina remain patent. Visualized upper thoracic spine demonstrates no significant findings. IMPRESSION: 1. Chronic 4 mm facet mediated anterolisthesis of C4 on C5 with associated disc bulge and severe facet arthropathy,  resulting in severe canal and bilateral C5 foraminal stenosis. 2. Faint T2 hyperintensity within the cervical spinal cord at the level of C4-5, consistent with mild and/or early edema and/or myelomalacia. 3. 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 Mild spinal stenosis at the C3-4, C5-6, and C6-7 levels. Additional moderate to severe multilevel foraminal narrowing as above. Electronically Signed   By: Jeannine Boga M.D.   On: 12/16/2017 20:17      Sherwood Gambler, MD 12/16/17 [phone]

## 2017-12-16 NOTE — ED Provider Notes (Signed)
[facility] EMERGENCY DEPARTMENT Provider Note   CSN: [id number] Arrival date & time: 12/16/17  0856     History   Chief Complaint Chief Complaint  Patient presents with  . Left arm problem    HPI Oscar [name] is a 84 y.o. male.  84 yo M with a chief complaint of vertigo.  Patient has had episodic issues of this for the past couple months.  He woke up this morning and felt like this 1 was worse.  Worse with head movement worse with turning while standing.  He had been seen at an urgent care and diagnosed with vertigo and started on meclizine.  Couple months ago this resolved in 2 days.  And however has seem to come and go.  The patient also noticed that he is having trouble lifting his left arm against gravity, he cannot get it more than about 10 degrees over 90 degrees.  He denies pain denies numbness or tingling.  Denies neck pain.  Denies recent head injury.  He did have some episodic neck pain when he awoke this morning.  Denies headache am  The history is provided by the patient.  Illness  This is a new problem. The current episode started yesterday. The problem occurs constantly. The problem has not changed since onset.Pertinent negatives include no chest pain, no abdominal pain, no headaches and no shortness of breath. Nothing aggravates the symptoms. Nothing relieves the symptoms. He has tried nothing for the symptoms. The treatment provided no relief.    Past Medical History:  Diagnosis Date  . Bladder tumor   . GERD (gastroesophageal reflux disease)   . History of appendectomy   . History of concussion X2   NO RESIDUALS  . History of peptic ulcer 1970'S  . Hyperlipidemia   . Status post placement of implantable loop recorder 11/08/2014    Patient Active Problem List   Diagnosis Date Noted  . Status post placement of implantable loop recorder 11/08/2014  . Near syncope 05/16/2014  . Cancer of bladder ([place]) 02/10/2013    Past Surgical History:    Procedure Laterality Date  . APPENDECTOMY  1970  . CARDIOVASCULAR STRESS TEST  03/22/09  . CYSTOSCOPY WITH BIOPSY N/A 02/10/2013   Procedure: CYSTOSCOPY WITH COLD CUP BIOPSY/FULGERATION;  Surgeon: Bernestine Amass, MD;  Location: [facility];  Service: Urology;  Laterality: N/A;  ALSO FULGERATION   . INGUINAL HERNIA REPAIR Right 07-14-2002  . LOOP RECORDER IMPLANT N/A 05/24/2014   Procedure: LOOP RECORDER IMPLANT;  Surgeon: Sanda Klein, MD;  Location: [facility];  Service: Cardiovascular;  Laterality: N/A;  . TRANSURETHRAL RESECTION OF BLADDER TUMOR  03-10-2008        Home Medications    Prior to Admission medications   Medication Sig Start Date End Date Taking? Authorizing Provider  aspirin EC 81 MG tablet Take 81 mg by mouth daily.   Yes [provider]  meclizine (ANTIVERT) 25 MG tablet Take 25 mg by mouth 3 (three) times daily as needed for dizziness.   Yes [provider]  omeprazole (PRILOSEC) 20 MG capsule Take 20 mg by mouth every morning.   Yes [provider]  atorvastatin (LIPITOR) 20 MG tablet Take 1 tablet by mouth every other day. 12/20/15   [provider]  Glucos-Chondroit-Hyaluron-MSM (GLUCOSAMINE CHONDROITIN JOINT PO) Take 2 tablets by mouth daily.     [provider]  Naproxen Sodium (ALEVE) 220 MG CAPS Take 1 tablet by mouth as needed (  for pain).     [provider]  tamsulosin (FLOMAX) 0.4 MG CAPS capsule Take 1 capsule by mouth daily. 11/28/15   [provider]    Family History Family History  Problem Relation Age of Onset  . Heart failure Mother   . Stroke Father   . Diabetes Brother   . Heart failure Brother     Social History Social History   Tobacco Use  . Smoking status: Former Smoker    Packs/day: 0.50    Years: 30.00    Pack years: 15.00    Types: Cigarettes    Last attempt to quit: 02/06/1979    Years since quitting: 38.8  . Smokeless tobacco: Never Used  Substance  Use Topics  . Alcohol use: No    Alcohol/week: 0.0 oz  . Drug use: No     Allergies   Patient has no known allergies.   Review of Systems Review of Systems  Constitutional: Negative for chills and fever.  HENT: Negative for congestion and facial swelling.   Eyes: Negative for discharge and visual disturbance.  Respiratory: Negative for shortness of breath.   Cardiovascular: Negative for chest pain and palpitations.  Gastrointestinal: Negative for abdominal pain, diarrhea and vomiting.  Musculoskeletal: Negative for arthralgias and myalgias.  Skin: Negative for color change and rash.  Neurological: Positive for dizziness and weakness. Negative for tremors, syncope and headaches.  Psychiatric/Behavioral: Negative for confusion and dysphoric mood.     Physical Exam Updated Vital Signs BP 130/70   Pulse (!) 58   Temp 98.2 F (36.8 C) (Oral)   Resp 13   Ht 5\' 7"  (1.702 m)   Wt 76.2 kg (168 lb)   SpO2 100%   BMI 26.31 kg/m   Physical Exam  Constitutional: He is oriented to person, place, and time. He appears well-developed and well-nourished.  HENT:  Head: Normocephalic and atraumatic.  Eyes: Pupils are equal, round, and reactive to light. EOM are normal.  Neck: Normal range of motion. Neck supple. No JVD present.  Cardiovascular: Normal rate and regular rhythm. Exam reveals no gallop and no friction rub.  No murmur heard. Pulmonary/Chest: No respiratory distress. He has no wheezes.  Abdominal: He exhibits no distension. There is no rebound and no guarding.  Musculoskeletal: Normal range of motion.  Neurological: He is alert and oriented to person, place, and time.  Left-sided fast-going nystagmus.  5 out of 5 muscle strength to bilateral lower extremities, 5 out of 5 muscle strength to the left upper extremity with flexion and extension of the forearm however with forward flexion of the left shoulder the patient has 3 out of 5 muscle strength.  5 out of 5 to the  right.  Patient goes past my finger on finger to nose with the left upper extremity  Skin: No rash noted. No pallor.  Psychiatric: He has a normal mood and affect. His behavior is normal.  Nursing note and vitals reviewed.    ED Treatments / Results  Labs (all labs ordered are listed, but only abnormal results are displayed) Labs Reviewed  CBC  DIFFERENTIAL  TROPONIN I  ETHANOL  PROTIME-INR  APTT  COMPREHENSIVE METABOLIC PANEL  RAPID URINE DRUG SCREEN, HOSP PERFORMED  URINALYSIS, ROUTINE W REFLEX MICROSCOPIC    EKG EKG Interpretation  Date/Time:  Tuesday December 16 2017 09:04:53 EDT Ventricular Rate:  62 PR Interval:    QRS Duration: 134 QT Interval:  422 QTC Calculation: 429 R Axis:   10 Text Interpretation:  Sinus rhythm Prolonged PR interval Left bundle branch block Baseline wander in lead(s) I widened qrs since prior Otherwise no significant change Confirmed by Deno Etienne [phone]) on 12/16/2017 9:57:11 AM   Radiology No results found.  Procedures Procedures (including critical care time)  Medications Ordered in ED Medications - No data to display   Initial Impression / Assessment and Plan / ED Course  I have reviewed the triage vital signs and the nursing notes.  Pertinent labs & imaging results that were available during my care of the patient were reviewed by me and considered in my medical decision making (see chart for details).     84 yo M with a chief complaint of vertigo.  Patient has had episodic issues of this for the past couple months.  He woke up this morning and felt like this 1 was worse.  Worse with head movement worse with turning while standing.  He had been seen at an urgent care and diagnosed with vertigo and started on meclizine.  Couple months ago this resolved in 2 days.  And however has seem to come and go.  The patient also noticed that he is having trouble lifting his left arm against gravity, he cannot get it more than about 10 degrees  over 90 degrees.  He denies pain denies numbness or tingling.  Denies neck pain.  Denies recent head injury.  The patient has 2 separate neurologic complaints.  I am unsure how these are interrelated.  I will discuss with the neurologist.  I discussed the case with neurology, they recommended the patient get an MRI to rule out posterior circulation stroke.  They felt that his history was more consistent with a peripheral cause including the isolated shoulder weakness.  They felt that this could get followed up with an outpatient neurology evaluation if the MRI was negative.  I discussed the case with Dr. Jeanell Sparrow, patient electing to [facility].   The patients results and plan were reviewed and discussed.   Any x-rays performed were independently reviewed by myself.   Differential diagnosis were considered with the presenting HPI.  Medications - No data to display  Vitals:   12/16/17 0919 12/16/17 0923 12/16/17 1000 12/16/17 1030  BP: (!) 168/91  (!) 142/85 130/70  Pulse: 61  (!) 58   Resp: 18  15 13   Temp: 98.2 F (36.8 C)     TempSrc: Oral     SpO2: 100%  100%   Weight:  76.2 kg (168 lb)    Height:  5\' 7"  (1.702 m)      Final diagnoses:  Left arm weakness  Dizziness      Final Clinical Impressions(s) / ED Diagnoses   Final diagnoses:  Left arm weakness  Dizziness    ED Discharge Orders    None       Deno Etienne, DO 12/16/17 1038

## 2017-12-16 NOTE — ED Notes (Signed)
Pt returned from MRI, Ambulated to bathroom unassisteed

## 2018-01-02 ENCOUNTER — Other Ambulatory Visit: Payer: Self-pay | Admitting: Neurosurgery

## 2018-01-02 ENCOUNTER — Telehealth: Payer: Self-pay | Admitting: Cardiovascular Disease

## 2018-01-02 NOTE — Telephone Encounter (Signed)
   Primary Cardiologist:Mihai Croitoru, MD  Chart reviewed as part of pre-operative protocol coverage. Because of Oscar [name]'s past medical history and time since last visit, he/she will require a follow-up visit in order to better assess preoperative cardiovascular risk.  Pre-op covering staff: - Please schedule appointment and call patient to inform them. - Please contact requesting surgeon's office via preferred method (i.e, phone, fax) to inform them of need for appointment prior to surgery.  Lyda Jester, PA-C  01/02/2018, 2:31 PM

## 2018-01-02 NOTE — Telephone Encounter (Signed)
   [facility] Pre-operative Risk Assessment    Request for surgical clearance:  1. What type of surgery is being performed? C4-5 anterior cervical fusion   2. When is this surgery scheduled? Jan 27, 2018   3. What type of clearance is required (medical clearance vs. Pharmacy clearance to hold med vs. Both)? Both   4. Are there any medications that need to be held prior to surgery and how long? None specified - patient is on ASA   5. Practice name and name of physician performing surgery? Dr. Earnie Larsson @ [facility]    6. What is your office phone number [phone]) - ext: [phone] for [facility]    7.   What is your office fax number [phone])  8.   Anesthesia type (None, local, MAC, general) ? Not specified    Oscar [name] 01/02/2018, 12:25 PM  _________________________________________________________________   (provider comments below)

## 2018-01-02 NOTE — Telephone Encounter (Signed)
Called pt re: surgical clearance and let him know that he would have to be seen in the office before he could get clearance. Pt agreeable and has scheduled to see Rosaria Ferries, PA-C, 01/18/18 @ 11:30. Pt thanked me for the call.

## 2018-01-19 ENCOUNTER — Ambulatory Visit (INDEPENDENT_AMBULATORY_CARE_PROVIDER_SITE_OTHER): Payer: Medicare Other | Admitting: Physician Assistant

## 2018-01-19 ENCOUNTER — Encounter: Payer: Self-pay | Admitting: Physician Assistant

## 2018-01-19 VITALS — BP 140/80 | HR 62 | Ht 68.0 in | Wt 170.0 lb

## 2018-01-19 DIAGNOSIS — R0609 Other forms of dyspnea: Secondary | ICD-10-CM

## 2018-01-19 DIAGNOSIS — R55 Syncope and collapse: Secondary | ICD-10-CM

## 2018-01-19 DIAGNOSIS — Z01810 Encounter for preprocedural cardiovascular examination: Secondary | ICD-10-CM | POA: Diagnosis not present

## 2018-01-19 NOTE — Patient Instructions (Signed)
Medication Instructions:  No medication changes  Labwork: none  Testing/Procedures: Your physician has requested that you have a lexiscan myoview. For further information please visit [email or web address]. Please follow instruction sheet, as given. [facility]  Follow-Up: Your physician recommends that you schedule a follow-up appointment in: Keep annual appointment with Dr. Sallyanne Kuster if test is normal   Any Other Special Instructions Will Be Listed Below (If Applicable). Cardiac Nuclear Scan A cardiac nuclear scan is a test that measures blood flow to the heart when a person is resting and when he or she is exercising. The test looks for problems such as:  Not enough blood reaching a portion of the heart.  The heart muscle not working normally.  You may need this test if:  You have heart disease.  You have had abnormal lab results.  You have had heart surgery or angioplasty.  You have chest pain.  You have shortness of breath.  In this test, a radioactive dye (tracer) is injected into your bloodstream. After the tracer has traveled to your heart, an imaging device is used to measure how much of the tracer is absorbed by or distributed to various areas of your heart. This procedure is usually done at a hospital and takes 2-4 hours. Tell a health care provider about:  Any allergies you have.  All medicines you are taking, including vitamins, herbs, eye drops, creams, and over-the-counter medicines.  Any problems you or family members have had with the use of anesthetic medicines.  Any blood disorders you have.  Any surgeries you have had.  Any medical conditions you have.  Whether you are pregnant or may be pregnant. What are the risks? Generally, this is a safe procedure. However, problems may occur, including:  Serious chest pain and heart attack. This is only a risk if the stress portion of the test is done.  Rapid heartbeat.  Sensation of warmth in your  chest. This usually passes quickly.  What happens before the procedure?  Ask your health care provider about changing or stopping your regular medicines. This is especially important if you are taking diabetes medicines or blood thinners.  Remove your jewelry on the day of the procedure. What happens during the procedure?  An IV tube will be inserted into one of your veins.  Your health care provider will inject a small amount of radioactive tracer through the tube.  You will wait for 20-40 minutes while the tracer travels through your bloodstream.  Your heart activity will be monitored with an electrocardiogram (ECG).  You will lie down on an exam table.  Images of your heart will be taken for about 15-20 minutes.  You may be asked to exercise on a treadmill or stationary bike. While you exercise, your heart's activity will be monitored with an ECG, and your blood pressure will be checked. If you are unable to exercise, you may be given a medicine to increase blood flow to parts of your heart.  When blood flow to your heart has peaked, a tracer will again be injected through the IV tube.  After 20-40 minutes, you will get back on the exam table and have more images taken of your heart.  When the procedure is over, your IV tube will be removed. The procedure may vary among health care providers and hospitals. Depending on the type of tracer used, scans may need to be repeated 3-4 hours later. What happens after the procedure?  Unless your health care provider tells you otherwise,  you may return to your normal schedule, including diet, activities, and medicines.  Unless your health care provider tells you otherwise, you may increase your fluid intake. This will help flush the contrast dye from your body. Drink enough fluid to keep your urine clear or pale yellow.  It is up to you to get your test results. Ask your health care provider, or the department that is doing the test, when  your results will be ready. Summary  A cardiac nuclear scan measures the blood flow to the heart when a person is resting and when he or she is exercising.  You may need this test if you are at risk for heart disease.  Tell your health care provider if you are pregnant.  Unless your health care provider tells you otherwise, increase your fluid intake. This will help flush the contrast dye from your body. Drink enough fluid to keep your urine clear or pale yellow. This information is not intended to replace advice given to you by your health care provider. Make sure you discuss any questions you have with your health care provider. Document Released: 10/04/2004 Document Revised: 09/11/2016 Document Reviewed: 08/18/2013 Elsevier Interactive Patient Education  2017 [employer].     If you need a refill on your cardiac medications before your next appointment, please call your pharmacy.

## 2018-01-19 NOTE — Progress Notes (Addendum)
Cardiology Office Note   Date:  01/19/2018   ID:  Oscar [name], DOB [birth year], MRN [id number]  PCP:  Berkley Harvey, NP  Cardiologist:  Dr Sallyanne Kuster 09/17/208  Rosaria Ferries, PA-C   Chief Complaint  Patient presents with  . Follow-up    History of Present Illness: Oscar [name] is a 84 y.o. male with a history of ILR placement 2016 for near syncope,  GERD, HLD, LBBB, prolonged PR, nl echo and MV 2015  04/12 phone notes regarding C spine surgery>>appt made.   Oscar [name] presents for cardiology evaluation and follow up.  He does push-ups, plays golf, mows the lawn. He climbs stairs regularly (workshop is in the basement).  Does not get CP w/ exertion. He has some DOE, noticed it when he got out doing yard work this spring. It is a little worse after the winter layoff.  He also is not able to do as many push-ups as previously, but is not sure how much of this is related to left arm weakness.  He gets tired more quickly than previously. He was not as active with walking as usual over the winter.   Does not wake with LE edema, no orthopnea or PND. Has nocturia, sporadically several times, generally 0-1 x night.   No palpitations, no feeling his heart beat irregularly. He has been light-headed 4-5 x in the last year. He notices it on one particular golf hole. He has to chip up from the L side, it doesn't go well.   His neck sx were fairly sudden in onset. The pain and L arm difficulty became much worse recently. However, when he thought about it, his golf game has been affected by this for some time.    Past Medical History:  Diagnosis Date  . Bladder tumor   . GERD (gastroesophageal reflux disease)   . History of appendectomy   . History of concussion    X2   NO RESIDUALS  . History of peptic ulcer 1970'S  . Hyperlipidemia   . Status post placement of implantable loop recorder 11/08/2014    Past Surgical History:  Procedure Laterality Date  . APPENDECTOMY   1970  . CARDIOVASCULAR STRESS TEST  03/22/09  . CYSTOSCOPY WITH BIOPSY N/A 02/10/2013   Procedure: CYSTOSCOPY WITH COLD CUP BIOPSY/FULGERATION;  Surgeon: Bernestine Amass, MD;  [facility]: [facility];  Service: Urology;  Laterality: N/A;  ALSO FULGERATION   . INGUINAL HERNIA REPAIR Right 07-14-2002  . LOOP RECORDER IMPLANT N/A 05/24/2014   Procedure: LOOP RECORDER IMPLANT;  Surgeon: Sanda Klein, MD;  Location: [facility];  Service: Cardiovascular;  Laterality: N/A;  . TRANSURETHRAL RESECTION OF BLADDER TUMOR  03-10-2008    Current Outpatient Medications  Medication Sig Dispense Refill  . Artificial Tear Ointment (DRY EYES OP) Place 1-2 drops into both eyes 2 (two) times daily as needed (for dry eyes).    Marland Kitchen aspirin EC 81 MG tablet Take 81 mg by mouth daily.    . Glucos-Chondroit-Hyaluron-MSM (GLUCOSAMINE CHONDROITIN JOINT PO) Take 2 tablets by mouth daily.     Marland Kitchen MAGNESIUM PO Take 1 tablet by mouth daily.    . meclizine (ANTIVERT) 25 MG tablet Take 25 mg by mouth 3 (three) times daily as needed for dizziness.    . naproxen sodium (ALEVE) 220 MG tablet Take 220 mg by mouth daily as needed (for pain).     Marland Kitchen omeprazole (PRILOSEC) 20 MG capsule Take 20 mg by  mouth every morning.    Marland Kitchen POTASSIUM PO Take 1 tablet by mouth daily.     No current facility-administered medications for this visit.     Allergies:   Patient has no known allergies.    Social History:  The patient  reports that he quit smoking about 38 years ago. His smoking use included cigarettes. He has a 15.00 pack-year smoking history. He has never used smokeless tobacco. He reports that he does not drink alcohol or use drugs.   Family History:  The patient's family history includes Diabetes in his brother; Heart failure in his brother and mother; Stroke in his father.    ROS:  Please see the history of present illness. All other systems are reviewed and negative.    PHYSICAL EXAM: VS:  BP 140/80 (BP Location:  Left Arm, Patient Position: Sitting, Cuff Size: Normal)   Pulse 62   Ht 5\' 8"  (1.727 m)   Wt 170 lb (77.1 kg)   BMI 25.85 kg/m  , BMI Body mass index is 25.85 kg/m. GEN: Well nourished, well developed, male in no acute distress  HEENT: normal for age  Neck: no JVD, no carotid bruit, no masses Cardiac: RRR; soft murmur, no rubs, or gallops Respiratory:  clear to auscultation bilaterally, normal work of breathing GI: soft, nontender, nondistended, + BS MS: no deformity or atrophy; no edema; distal pulses are 2+ in all 4 extremities   Skin: warm and dry, no rash Neuro:  Strength and sensation are intact Psych: euthymic mood, full affect   EKG:  EKG is ordered today. The ekg ordered today demonstrates sinus rhythm, heart rate 62, left bundle branch block, which was seen on 12/16/2017   Recent Labs: 12/16/2017: ALT 18; BUN 19; Creatinine, Ser 1.05; Hemoglobin 13.5; Platelets 198; Potassium 4.5; Sodium 137    Lipid Panel No results found for: CHOL, TRIG, HDL, CHOLHDL, VLDL, LDLCALC, LDLDIRECT   Wt Readings from Last 3 Encounters:  01/19/18 170 lb (77.1 kg)  12/16/17 168 lb (76.2 kg)  06/09/17 164 lb (74.4 kg)     Other studies Reviewed: Additional studies/ records that were reviewed today include: Office notes, hospital records and testing.  ASSESSMENT AND PLAN:  1.  Preoperative evaluation: In general, he can do 4 METS and therefore is at low risk for cardiac complications.  His RCRI score is 0.4%  2.  Dyspnea on exertion -On reviewing the situation with him, he is concerned that his stamina is less than it was in his dyspnea on exertion is worse than it was.  He is not sure that all of this can be attributed to decreased activity level over the winter.  He has not had a stress test in 4 years.  He cannot be put on a treadmill because of his left bundle branch block.  -I will order a Lexiscan Myoview, follow-up on the results  3.  Near syncope: -He has occasional episodes,  mostly when playing golf and mostly on one particular hole.  I wonder if this is coming from something related to his neck issues.  He does not feel that he is in danger of losing consciousness and the feeling passes very quickly. Nothing has shown up on the loop recorder -Follow for symptoms  Current medicines are reviewed at length with the patient today.  The patient does not have concerns regarding medicines.  The following changes have been made:  no change  Labs/ tests ordered today include:   Orders Placed This Encounter  Procedures  . MYOCARDIAL PERFUSION IMAGING  . EKG 12-Lead     Disposition:   FU with Dr Sallyanne Kuster  Signed, Rosaria Ferries, PA-C  01/19/2018 5:12 PM    [facility] Phone: [phone]; Fax: [phone]  This note was written with the assistance of speech recognition software. Please excuse any transcriptional errors.  ADDENDUM: 01/22/2018 Myoview results reviewed.  There is diaphragmatic attenuation, but no scar and no ischemia, EF 52%. He is at acceptable risk for the planned surgical procedure without any additional testing.

## 2018-01-19 NOTE — Pre-Procedure Instructions (Signed)
Oscar [name]  01/19/2018      [facility] [address] [place], [place] - [address] [place] [address] Phone: [phone] Fax: [phone]    Your procedure is scheduled on May 7  Report to [place] at 0930 A.M.  Call this number if you have problems the morning of surgery:  [phone]   Remember:  Do not eat food or drink liquids after midnight.  Continue all medications as directed by your physician except follow these medication instructions before surgery below   Take these medicines the morning of surgery with A SIP OF WATER  Eye drops if needed meclizine (ANTIVERT) omeprazole (PRILOSEC)  7 days prior to surgery STOP taking any Aspirin(unless otherwise instructed by your surgeon), Aleve, Naproxen, Ibuprofen, Motrin, Advil, Goody's, BC's, all herbal medications, fish oil, and all vitamins   Do not wear jewelry  Do not wear lotions, powders, or cologne, or deodorant.  Men may shave face and neck.  Do not bring valuables to the hospital.  [facility] is not responsible for any belongings or valuables.  Contacts, dentures or bridgework may not be worn into surgery.  Leave your suitcase in the car.  After surgery it may be brought to your room.  For patients admitted to the hospital, discharge time will be determined by your treatment team.  Patients discharged the day of surgery will not be allowed to drive home.    Special instructions:   [facility]- Preparing For Surgery  Before surgery, you can play an important role. Because skin is not sterile, your skin needs to be as free of germs as possible. You can reduce the number of germs on your skin by washing with CHG (chlorahexidine gluconate) Soap before surgery.  CHG is an antiseptic cleaner which kills germs and bonds with the skin to continue killing germs even after washing.  Please do not use if you have an allergy to CHG or antibacterial  soaps. If your skin becomes reddened/irritated stop using the CHG.  Do not shave (including legs and underarms) for at least 48 hours prior to first CHG shower. It is OK to shave your face.  Please follow these instructions carefully.   1. Shower the NIGHT BEFORE SURGERY and the MORNING OF SURGERY with CHG.   2. If you chose to wash your hair, wash your hair first as usual with your normal shampoo.  3. After you shampoo, rinse your hair and body thoroughly to remove the shampoo.  4. Use CHG as you would any other liquid soap. You can apply CHG directly to the skin and wash gently with a scrungie or a clean washcloth.   5. Apply the CHG Soap to your body ONLY FROM THE NECK DOWN.  Do not use on open wounds or open sores. Avoid contact with your eyes, ears, mouth and genitals (private parts). Wash Face and genitals (private parts)  with your normal soap.  6. Wash thoroughly, paying special attention to the area where your surgery will be performed.  7. Thoroughly rinse your body with warm water from the neck down.  8. DO NOT shower/wash with your normal soap after using and rinsing off the CHG Soap.  9. Pat yourself dry with a CLEAN TOWEL.  10. Wear CLEAN PAJAMAS to bed the night before surgery, wear comfortable clothes the morning of surgery  11. Place CLEAN SHEETS on your bed the night of your first shower and  DO NOT SLEEP WITH PETS.    Day of Surgery: Do not apply any deodorants/lotions. Please wear clean clothes to the hospital/surgery center.      Please read over the following fact sheets that you were given.

## 2018-01-20 ENCOUNTER — Encounter (HOSPITAL_COMMUNITY): Payer: Self-pay

## 2018-01-20 ENCOUNTER — Telehealth (HOSPITAL_COMMUNITY): Payer: Self-pay

## 2018-01-20 ENCOUNTER — Encounter (HOSPITAL_COMMUNITY)
Admission: RE | Admit: 2018-01-20 | Discharge: 2018-01-20 | Disposition: A | Discharge status: Home / Self Care | Payer: Medicare Other | Source: Ambulatory Visit | Attending: Neurosurgery | Admitting: Neurosurgery

## 2018-01-20 ENCOUNTER — Other Ambulatory Visit: Payer: Self-pay

## 2018-01-20 DIAGNOSIS — Z01812 Encounter for preprocedural laboratory examination: Secondary | ICD-10-CM | POA: Diagnosis present

## 2018-01-20 DIAGNOSIS — R0609 Other forms of dyspnea: Secondary | ICD-10-CM | POA: Diagnosis not present

## 2018-01-20 DIAGNOSIS — Z01810 Encounter for preprocedural cardiovascular examination: Secondary | ICD-10-CM | POA: Diagnosis not present

## 2018-01-20 HISTORY — DX: Spinal stenosis, cervical region: M48.02

## 2018-01-20 HISTORY — DX: Cardiac arrhythmia, unspecified: I49.9

## 2018-01-20 HISTORY — DX: Left bundle-branch block, unspecified: I44.7

## 2018-01-20 LAB — BASIC METABOLIC PANEL
Anion gap: 9 (ref 5–15)
BUN: 17 mg/dL (ref 6–20)
CO2: 24 mmol/L (ref 22–32)
Calcium: 9.3 mg/dL (ref 8.9–10.3)
Chloride: 105 mmol/L (ref 101–111)
Creatinine, Ser: 1.04 mg/dL (ref 0.61–1.24)
GFR calc non Af Amer: >60 mL/min (ref >60)
Glucose, Bld: 70 mg/dL (ref 65–99)
POTASSIUM: 4.3 mmol/L (ref 3.5–5.1)
Sodium: 138 mmol/L (ref 135–145)

## 2018-01-20 LAB — CBC WITH DIFFERENTIAL/PLATELET
Basophils Absolute: 0 10*3/uL (ref 0.0–0.1)
Basophils Relative: 1 %
Eosinophils Absolute: 0.5 10*3/uL (ref 0.0–0.7)
Eosinophils Relative: 9 %
HEMATOCRIT: 43 % (ref 39.0–52.0)
HEMOGLOBIN: 14.3 g/dL (ref 13.0–17.0)
LYMPHS ABS: 0.9 10*3/uL (ref 0.7–4.0)
Lymphocytes Relative: 18 %
MCH: 31.5 pg (ref 26.0–34.0)
MCHC: 33.3 g/dL (ref 30.0–36.0)
MCV: 94.7 fL (ref 78.0–100.0)
Monocytes Absolute: 0.5 10*3/uL (ref 0.1–1.0)
Monocytes Relative: 10 %
NEUTROS PCT: 62 %
Neutro Abs: 3.1 10*3/uL (ref 1.7–7.7)
Platelets: 243 10*3/uL (ref 150–400)
RBC: 4.54 MIL/uL (ref 4.22–5.81)
RDW: 13.6 % (ref 11.5–15.5)
WBC: 5 10*3/uL (ref 4.0–10.5)

## 2018-01-20 LAB — SURGICAL PCR SCREEN
MRSA, PCR: NEGATIVE
Staphylococcus aureus: NEGATIVE

## 2018-01-20 NOTE — Telephone Encounter (Signed)
Encounter complete. 

## 2018-01-20 NOTE — Progress Notes (Signed)
I have near-syncope during certain steps of ICD implantation, kinda like his golf hole... MCr

## 2018-01-20 NOTE — Progress Notes (Addendum)
PCP - NP Eldridge Abrahams  Cardiologist - Dr. Sallyanne Kuster- Clearance note 01/19/18  Chest x-ray - Denies  EKG - 12/17/17 (E)  Stress Test - 01/21/18  ECHO - 2015 (E)  Cardiac Cath - Denies  Sleep Study - Yes- Negative CPAP - None  LABS- 01/20/18: CBC w/D, BMP  ASA- LD 4/30   Anesthesia- Yes- cardiac history  Pt denies having chest pain, sob, or fever at this time. All instructions explained to the pt, with a verbal understanding of the material. Pt agrees to go over the instructions while at home for a better understanding. The opportunity to ask questions was provided.

## 2018-01-21 ENCOUNTER — Ambulatory Visit (HOSPITAL_COMMUNITY)
Admission: RE | Admit: 2018-01-21 | Discharge: 2018-01-21 | Disposition: A | Discharge status: Home / Self Care | Payer: Medicare Other | Source: Ambulatory Visit | Attending: Internal Medicine | Admitting: Internal Medicine

## 2018-01-21 ENCOUNTER — Encounter (HOSPITAL_COMMUNITY): Payer: Self-pay

## 2018-01-21 DIAGNOSIS — R0609 Other forms of dyspnea: Secondary | ICD-10-CM | POA: Diagnosis not present

## 2018-01-21 DIAGNOSIS — Z01810 Encounter for preprocedural cardiovascular examination: Secondary | ICD-10-CM | POA: Insufficient documentation

## 2018-01-21 DIAGNOSIS — Z01812 Encounter for preprocedural laboratory examination: Secondary | ICD-10-CM | POA: Insufficient documentation

## 2018-01-21 LAB — MYOCARDIAL PERFUSION IMAGING
CHL CUP NUCLEAR SDS: 3
CHL CUP NUCLEAR SRS: 5
CSEPPHR: 70 {beats}/min
LV sys vol: 43 mL
Rest HR: 57 {beats}/min
SSS: 8
TID: 1.11

## 2018-01-21 MED ORDER — TECHNETIUM TC 99M TETROFOSMIN IV KIT
10.5000 | PACK | Freq: Once | INTRAVENOUS | Status: AC | PRN
  Administered 2018-01-21: 10.5 via INTRAVENOUS
  Filled 2018-01-21: qty 11

## 2018-01-21 MED ORDER — REGADENOSON 0.4 MG/5ML IV SOLN
0.4000 mg | Freq: Once | INTRAVENOUS | Status: AC
  Administered 2018-01-21: 0.4 mg via INTRAVENOUS

## 2018-01-21 MED ORDER — TECHNETIUM TC 99M TETROFOSMIN IV KIT
32.4000 | PACK | Freq: Once | INTRAVENOUS | Status: AC | PRN
  Administered 2018-01-21: 32.4 via INTRAVENOUS
  Filled 2018-01-21: qty 33

## 2018-01-21 NOTE — Progress Notes (Signed)
Anesthesia Chart Review:   Case:  [id number] Date/Time:  01/27/18 1115   Procedure:  ACDF - C4-C5 (N/A )   Anesthesia type:  General   Pre-op diagnosis:  Stenosis   Location:  [facility] OR ROOM 5 / [place] [facility]   Surgeon:  Earnie Larsson, MD      DISCUSSION: Pt is an 84 year old male with hx of near syncope. Has loop recorder placed in 2016 for this, no arrhythmias found to date. Saw cardiology for pre-op eval; stress test ordered, low risk results documented below.    VS: BP 126/78   Pulse 67   Temp 36.8 C (Oral)   Resp 16   Ht 5\' 7"  (1.702 m)   Wt 170 lb 1.6 oz (77.2 kg)   SpO2 100%   BMI 26.64 kg/m    PROVIDERS: PCP is Berkley Harvey, NP (notes in care everywhere)  Cardiologist is Sanda Klein, MD. Cliffton Asters Barrett, PA 01/19/18 for pre-op eval; stress test ordered, results below.    LABS: Labs reviewed: Acceptable for surgery. (all labs ordered are listed, but only abnormal results are displayed)  Labs Reviewed  SURGICAL PCR SCREEN  BASIC METABOLIC PANEL  CBC WITH DIFFERENTIAL/PLATELET     IMAGES:  MRI cervical spine 12/16/17:  1. Chronic 4 mm facet mediated anterolisthesis of C4 on C5 with associated disc bulge and severe facet arthropathy, resulting in severe canal and bilateral C5 foraminal stenosis. 2. Faint T2 hyperintensity within the cervical spinal cord at the level of C4-5, consistent with mild and/or early edema and/or myelomalacia. 3. 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 Mild spinal stenosis at the C3-4, C5-6, and C6-7 levels. Additional moderate to severe multilevel foraminal narrowing as above.  MRI brain 12/16/17:  - No acute intracranial finding. Atrophy and mild chronic small-vessel ischemic change of the hemispheric white matter, slightly progressive since 2015. - Possibly significant spinal stenosis at the C4-5 level, not primarily or completely evaluated. If there is clinical concern that any of the presentation could be  cervicogenic in nature, cervical spine study would be suggested   EKG 01/19/18: NSR. LBBB.    CV:  Nuclear stress test 01/21/18:   The left ventricular ejection fraction is mildly decreased (45-54%).  Nuclear stress EF: 52%.  There was no ST segment deviation noted during stress.  There is a medium defect of moderate severity present in the basal inferoseptal, basal inferior, mid inferoseptal, mid inferior and apical inferior location. The defect is non-reversible. In the setting of normal LVF, this is consistent with diaphragmatic attenuation artifact. Septal perfusion defect likely related to LBBB. No ischemia noted.  This is a low risk study.  Baseline EKG showed LBBB   Carotid duplex 06/07/15:  - Color duplex indicates moderate heterogeneous and calcified plaque, with no hemodynamically significant stenosis by duplex criteria in the extracranial cerebrovascular circulation.   Echo 04/10/14 (care everywhere):  1. The left ventricular chamber size is normal. 2. There is normal left ventricular systolic function. The EF is estimated at 60-65%. 3. There is trivial mitral and tricuspid regurgitation.    Past Medical History:  Diagnosis Date  . Bladder tumor   . Cervical stenosis of spine   . GERD (gastroesophageal reflux disease)   . History of appendectomy   . History of concussion    X2   NO RESIDUALS  . History of peptic ulcer 1970'S  . Hyperlipidemia   . Irregular heart beat   . LBBB (left bundle branch block)   .  Status post placement of implantable loop recorder 11/08/2014    Past Surgical History:  Procedure Laterality Date  . APPENDECTOMY  1970  . CARDIOVASCULAR STRESS TEST  03/22/09  . CYSTOSCOPY WITH BIOPSY N/A 02/10/2013   Procedure: CYSTOSCOPY WITH COLD CUP BIOPSY/FULGERATION;  Surgeon: Bernestine Amass, MD;  Location: [facility];  Service: Urology;  Laterality: N/A;  ALSO FULGERATION   . INGUINAL HERNIA REPAIR Right 07-14-2002  . LOOP  RECORDER IMPLANT N/A 05/24/2014   Procedure: LOOP RECORDER IMPLANT;  Surgeon: Sanda Klein, MD;  Location: [facility] CATH [facility];  Service: Cardiovascular;  Laterality: N/A;  . TONSILLECTOMY    . TRANSURETHRAL RESECTION OF BLADDER TUMOR  03-10-2008    MEDICATIONS: . Artificial Tear Ointment (DRY EYES OP)  . aspirin EC 81 MG tablet  . Glucos-Chondroit-Hyaluron-MSM (GLUCOSAMINE CHONDROITIN JOINT PO)  . MAGNESIUM PO  . meclizine (ANTIVERT) 25 MG tablet  . naproxen sodium (ALEVE) 220 MG tablet  . omeprazole (PRILOSEC) 20 MG capsule  . POTASSIUM PO   No current facility-administered medications for this encounter.     If no changes, I anticipate pt can proceed with surgery as scheduled.   Willeen Cass, FNP-BC [facility]/Anesthesiology Phone: [phone] 01/21/2018 4:39 PM

## 2018-01-22 NOTE — Telephone Encounter (Signed)
   Primary Cardiologist: Sanda Klein, MD  Patient seen in the office for preop evaluation and Lexiscan Myoview performed.  The Myoview was without scar or ischemia (diaphragmatic attenuation seen) and EF 52%. Therefore,  based on ACC/AHA guidelines, Oscar [name] would be at acceptable risk for the planned procedure without further cardiovascular testing.   I will route this recommendation to the requesting party via Epic fax function and remove from pre-op pool.  Please call with questions.  Rosaria Ferries, PA-C 01/22/2018, 9:55 AM

## 2018-01-27 ENCOUNTER — Inpatient Hospital Stay (HOSPITAL_COMMUNITY)
Admission: RE | Admit: 2018-01-27 | Discharge: 2018-01-27 | DRG: 472 | Disposition: A | Discharge status: Home / Self Care | Payer: Medicare Other | Source: Ambulatory Visit | Attending: Neurosurgery | Admitting: Neurosurgery

## 2018-01-27 ENCOUNTER — Inpatient Hospital Stay (HOSPITAL_COMMUNITY): Payer: Medicare Other | Admitting: Emergency Medicine

## 2018-01-27 ENCOUNTER — Inpatient Hospital Stay (HOSPITAL_COMMUNITY): Payer: Medicare Other | Admitting: Certified Registered Nurse Anesthetist

## 2018-01-27 ENCOUNTER — Inpatient Hospital Stay (HOSPITAL_COMMUNITY): Payer: Medicare Other

## 2018-01-27 ENCOUNTER — Encounter (HOSPITAL_COMMUNITY): Payer: Self-pay | Admitting: Certified Registered Nurse Anesthetist

## 2018-01-27 ENCOUNTER — Encounter (HOSPITAL_COMMUNITY)
Admission: RE | Disposition: A | Discharge status: Home / Self Care | Payer: Self-pay | Source: Ambulatory Visit | Attending: Neurosurgery

## 2018-01-27 DIAGNOSIS — K219 Gastro-esophageal reflux disease without esophagitis: Secondary | ICD-10-CM | POA: Diagnosis present

## 2018-01-27 DIAGNOSIS — M4802 Spinal stenosis, cervical region: Secondary | ICD-10-CM | POA: Diagnosis present

## 2018-01-27 DIAGNOSIS — Z7982 Long term (current) use of aspirin: Secondary | ICD-10-CM | POA: Diagnosis not present

## 2018-01-27 DIAGNOSIS — M5412 Radiculopathy, cervical region: Secondary | ICD-10-CM | POA: Diagnosis present

## 2018-01-27 DIAGNOSIS — Z79899 Other long term (current) drug therapy: Secondary | ICD-10-CM | POA: Diagnosis not present

## 2018-01-27 DIAGNOSIS — G959 Disease of spinal cord, unspecified: Secondary | ICD-10-CM

## 2018-01-27 DIAGNOSIS — Z87891 Personal history of nicotine dependence: Secondary | ICD-10-CM | POA: Diagnosis not present

## 2018-01-27 DIAGNOSIS — E785 Hyperlipidemia, unspecified: Secondary | ICD-10-CM | POA: Diagnosis present

## 2018-01-27 DIAGNOSIS — G992 Myelopathy in diseases classified elsewhere: Secondary | ICD-10-CM | POA: Diagnosis present

## 2018-01-27 DIAGNOSIS — Z419 Encounter for procedure for purposes other than remedying health state, unspecified: Secondary | ICD-10-CM

## 2018-01-27 DIAGNOSIS — Z87820 Personal history of traumatic brain injury: Secondary | ICD-10-CM

## 2018-01-27 HISTORY — PX: ANTERIOR CERVICAL DECOMP/DISCECTOMY FUSION: SHX1161

## 2018-01-27 HISTORY — DX: Disease of spinal cord, unspecified: G95.9

## 2018-01-27 SURGERY — ANTERIOR CERVICAL DECOMPRESSION/DISCECTOMY FUSION 1 LEVEL
Anesthesia: General

## 2018-01-27 MED ORDER — LIDOCAINE 2% (20 MG/ML) 5 ML SYRINGE
INTRAMUSCULAR | Status: AC
  Filled 2018-01-27: qty 5

## 2018-01-27 MED ORDER — CHLORHEXIDINE GLUCONATE CLOTH 2 % EX PADS: 6.0000 | MEDICATED_PAD | Freq: Once | CUTANEOUS | Status: DC

## 2018-01-27 MED ORDER — ONDANSETRON HCL 4 MG/2ML IJ SOLN: 4.0000 mg | Freq: Once | INTRAMUSCULAR | Status: DC | PRN

## 2018-01-27 MED ORDER — SODIUM CHLORIDE 0.9% FLUSH
3.0000 mL | Freq: Two times a day (BID) | INTRAVENOUS | Status: DC
  Administered 2018-01-27: 3 mL via INTRAVENOUS

## 2018-01-27 MED ORDER — DEXAMETHASONE SODIUM PHOSPHATE 10 MG/ML IJ SOLN
INTRAMUSCULAR | Status: AC
  Filled 2018-01-27: qty 1

## 2018-01-27 MED ORDER — CEFAZOLIN SODIUM-DEXTROSE 2-4 GM/100ML-% IV SOLN
INTRAVENOUS | Status: AC
  Filled 2018-01-27: qty 100

## 2018-01-27 MED ORDER — HYDROCODONE-ACETAMINOPHEN 5-325 MG PO TABS: 1.0000 | ORAL_TABLET | ORAL | 0 refills | Status: DC | PRN

## 2018-01-27 MED ORDER — PHENOL 1.4 % MT LIQD
1.0000 | OROMUCOSAL | Status: DC | PRN
  Administered 2018-01-27: 1 via OROMUCOSAL
  Filled 2018-01-27: qty 177

## 2018-01-27 MED ORDER — PANTOPRAZOLE SODIUM 40 MG PO TBEC
40.0000 mg | DELAYED_RELEASE_TABLET | Freq: Every day | ORAL | Status: DC
Start: 2018-01-28 — End: 2018-01-28

## 2018-01-27 MED ORDER — ONDANSETRON HCL 4 MG PO TABS: 4.0000 mg | ORAL_TABLET | Freq: Four times a day (QID) | ORAL | Status: DC | PRN

## 2018-01-27 MED ORDER — HYDROMORPHONE HCL 1 MG/ML IJ SOLN: 1.0000 mg | INTRAMUSCULAR | Status: DC | PRN

## 2018-01-27 MED ORDER — ONDANSETRON HCL 4 MG/2ML IJ SOLN
INTRAMUSCULAR | Status: DC | PRN
  Administered 2018-01-27: 4 mg via INTRAVENOUS

## 2018-01-27 MED ORDER — SODIUM CHLORIDE 0.9 % IV SOLN
INTRAVENOUS | Status: DC | PRN
  Administered 2018-01-27: 09:00:00

## 2018-01-27 MED ORDER — LIDOCAINE HCL (CARDIAC) PF 100 MG/5ML IV SOSY
PREFILLED_SYRINGE | INTRAVENOUS | Status: DC | PRN
  Administered 2018-01-27: 40 mg via INTRATRACHEAL
  Administered 2018-01-27: 100 mg via INTRAVENOUS

## 2018-01-27 MED ORDER — ACETAMINOPHEN 10 MG/ML IV SOLN
INTRAVENOUS | Status: AC
  Filled 2018-01-27: qty 100

## 2018-01-27 MED ORDER — ACETAMINOPHEN 650 MG RE SUPP: 650.0000 mg | RECTAL | Status: DC | PRN

## 2018-01-27 MED ORDER — CEFAZOLIN SODIUM-DEXTROSE 2-4 GM/100ML-% IV SOLN
2.0000 g | INTRAVENOUS | Status: AC
  Administered 2018-01-27: 2 g via INTRAVENOUS

## 2018-01-27 MED ORDER — FENTANYL CITRATE (PF) 250 MCG/5ML IJ SOLN
INTRAMUSCULAR | Status: AC
  Filled 2018-01-27: qty 5

## 2018-01-27 MED ORDER — DEXAMETHASONE SODIUM PHOSPHATE 10 MG/ML IJ SOLN
10.0000 mg | INTRAMUSCULAR | Status: AC
  Administered 2018-01-27: 10 mg via INTRAVENOUS

## 2018-01-27 MED ORDER — 0.9 % SODIUM CHLORIDE (POUR BTL) OPTIME
TOPICAL | Status: DC | PRN
  Administered 2018-01-27: 1000 mL

## 2018-01-27 MED ORDER — HYDROCODONE-ACETAMINOPHEN 10-325 MG PO TABS: 2.0000 | ORAL_TABLET | ORAL | Status: DC | PRN

## 2018-01-27 MED ORDER — SUGAMMADEX SODIUM 200 MG/2ML IV SOLN
INTRAVENOUS | Status: AC
  Filled 2018-01-27: qty 2

## 2018-01-27 MED ORDER — CEFAZOLIN SODIUM-DEXTROSE 1-4 GM/50ML-% IV SOLN
1.0000 g | Freq: Three times a day (TID) | INTRAVENOUS | Status: AC
  Administered 2018-01-27: 1 g via INTRAVENOUS
  Filled 2018-01-27: qty 50

## 2018-01-27 MED ORDER — FENTANYL CITRATE (PF) 100 MCG/2ML IJ SOLN
INTRAMUSCULAR | Status: DC | PRN
  Administered 2018-01-27 (×2): 25 ug via INTRAVENOUS
  Administered 2018-01-27: 50 ug via INTRAVENOUS
  Administered 2018-01-27: 25 ug via INTRAVENOUS

## 2018-01-27 MED ORDER — LACTATED RINGERS IV SOLN
INTRAVENOUS | Status: DC | PRN
  Administered 2018-01-27: 08:00:00 via INTRAVENOUS

## 2018-01-27 MED ORDER — MENTHOL 3 MG MT LOZG
1.0000 | LOZENGE | OROMUCOSAL | Status: DC | PRN
  Filled 2018-01-27: qty 9

## 2018-01-27 MED ORDER — MAGNESIUM OXIDE 400 (241.3 MG) MG PO TABS: 400.0000 mg | ORAL_TABLET | Freq: Every day | ORAL | Status: DC

## 2018-01-27 MED ORDER — SODIUM CHLORIDE 0.9 % IV SOLN: 250.0000 mL | INTRAVENOUS | Status: DC

## 2018-01-27 MED ORDER — PROPOFOL 10 MG/ML IV BOLUS
INTRAVENOUS | Status: DC | PRN
  Administered 2018-01-27: 90 mg via INTRAVENOUS

## 2018-01-27 MED ORDER — SODIUM CHLORIDE 0.9% FLUSH: 3.0000 mL | INTRAVENOUS | Status: DC | PRN

## 2018-01-27 MED ORDER — PROPOFOL 10 MG/ML IV BOLUS
INTRAVENOUS | Status: AC
  Filled 2018-01-27: qty 20

## 2018-01-27 MED ORDER — ONDANSETRON HCL 4 MG/2ML IJ SOLN
INTRAMUSCULAR | Status: AC
  Filled 2018-01-27: qty 2

## 2018-01-27 MED ORDER — ROCURONIUM BROMIDE 100 MG/10ML IV SOLN
INTRAVENOUS | Status: DC | PRN
  Administered 2018-01-27: 40 mg via INTRAVENOUS

## 2018-01-27 MED ORDER — HEMOSTATIC AGENTS (NO CHARGE) OPTIME
TOPICAL | Status: DC | PRN
  Administered 2018-01-27: 1 via TOPICAL

## 2018-01-27 MED ORDER — SUGAMMADEX SODIUM 200 MG/2ML IV SOLN
INTRAVENOUS | Status: DC | PRN
  Administered 2018-01-27: 150 mg via INTRAVENOUS

## 2018-01-27 MED ORDER — ACETAMINOPHEN 10 MG/ML IV SOLN
INTRAVENOUS | Status: DC | PRN
  Administered 2018-01-27: 1000 mg via INTRAVENOUS

## 2018-01-27 MED ORDER — ACETAMINOPHEN 10 MG/ML IV SOLN: 1000.0000 mg | Freq: Once | INTRAVENOUS | Status: DC | PRN

## 2018-01-27 MED ORDER — GLUCOSAMINE CHONDROITIN JOINT PO TABS: ORAL_TABLET | Freq: Every day | ORAL | Status: DC

## 2018-01-27 MED ORDER — CEFAZOLIN SODIUM-DEXTROSE 1-4 GM/50ML-% IV SOLN: 1.0000 g | Freq: Three times a day (TID) | INTRAVENOUS | Status: DC

## 2018-01-27 MED ORDER — MECLIZINE HCL 25 MG PO TABS
25.0000 mg | ORAL_TABLET | Freq: Three times a day (TID) | ORAL | Status: DC | PRN
  Filled 2018-01-27: qty 1

## 2018-01-27 MED ORDER — CYCLOBENZAPRINE HCL 10 MG PO TABS: 10.0000 mg | ORAL_TABLET | Freq: Three times a day (TID) | ORAL | Status: DC | PRN

## 2018-01-27 MED ORDER — HYDROCODONE-ACETAMINOPHEN 5-325 MG PO TABS
1.0000 | ORAL_TABLET | ORAL | Status: DC | PRN
  Filled 2018-01-27: qty 1

## 2018-01-27 MED ORDER — POTASSIUM 75 MG PO TABS: ORAL_TABLET | Freq: Every day | ORAL | Status: DC

## 2018-01-27 MED ORDER — DEXMEDETOMIDINE HCL IN NACL 200 MCG/50ML IV SOLN
INTRAVENOUS | Status: AC
  Filled 2018-01-27: qty 50

## 2018-01-27 MED ORDER — FENTANYL CITRATE (PF) 100 MCG/2ML IJ SOLN: 25.0000 ug | INTRAMUSCULAR | Status: DC | PRN

## 2018-01-27 MED ORDER — DEXMEDETOMIDINE HCL IN NACL 200 MCG/50ML IV SOLN
INTRAVENOUS | Status: DC | PRN
  Administered 2018-01-27: 0.4 ug/kg/h via INTRAVENOUS

## 2018-01-27 MED ORDER — ACETAMINOPHEN 325 MG PO TABS: 650.0000 mg | ORAL_TABLET | ORAL | Status: DC | PRN

## 2018-01-27 MED ORDER — ROCURONIUM BROMIDE 50 MG/5ML IV SOLN
INTRAVENOUS | Status: AC
  Filled 2018-01-27: qty 1

## 2018-01-27 MED ORDER — LACTATED RINGERS IV SOLN
INTRAVENOUS | Status: DC
  Administered 2018-01-27: 08:00:00 via INTRAVENOUS

## 2018-01-27 MED ORDER — THROMBIN (RECOMBINANT) 5000 UNITS EX SOLR
CUTANEOUS | Status: DC | PRN
  Administered 2018-01-27: 10000 [IU] via TOPICAL

## 2018-01-27 MED ORDER — ONDANSETRON HCL 4 MG/2ML IJ SOLN: 4.0000 mg | Freq: Four times a day (QID) | INTRAMUSCULAR | Status: DC | PRN

## 2018-01-27 MED ORDER — THROMBIN 5000 UNITS EX SOLR
CUTANEOUS | Status: AC
  Filled 2018-01-27: qty 15000

## 2018-01-27 MED ORDER — ARTIFICIAL TEARS OPHTHALMIC OINT
TOPICAL_OINTMENT | OPHTHALMIC | Status: AC
  Filled 2018-01-27: qty 3.5

## 2018-01-27 SURGICAL SUPPLY — 52 items
APL SKNCLS STERI-STRIP NONHPOA (GAUZE/BANDAGES/DRESSINGS) ×1
BAG DECANTER FOR FLEXI CONT (MISCELLANEOUS) ×2 IMPLANT
BENZOIN TINCTURE PRP APPL 2/3 (GAUZE/BANDAGES/DRESSINGS) ×2 IMPLANT
BIT DRILL 13 (BIT) ×1 IMPLANT
BUR MATCHSTICK NEURO 3.0 LAGG (BURR) ×2 IMPLANT
CANISTER SUCT 3000ML PPV (MISCELLANEOUS) ×2 IMPLANT
CARTRIDGE OIL MAESTRO DRILL (MISCELLANEOUS) ×1 IMPLANT
DIFFUSER DRILL AIR PNEUMATIC (MISCELLANEOUS) ×2 IMPLANT
DRAPE C-ARM 42X72 X-RAY (DRAPES) ×4 IMPLANT
DRAPE LAPAROTOMY 100X72 PEDS (DRAPES) ×2 IMPLANT
DRAPE MICROSCOPE LEICA (MISCELLANEOUS) ×2 IMPLANT
DURAPREP 6ML APPLICATOR 50/CS (WOUND CARE) ×2 IMPLANT
ELECT COATED BLADE 2.86 ST (ELECTRODE) ×2 IMPLANT
ELECT REM PT RETURN 9FT ADLT (ELECTROSURGICAL) ×2
ELECTRODE REM PT RTRN 9FT ADLT (ELECTROSURGICAL) ×1 IMPLANT
GAUZE SPONGE 4X4 12PLY STRL (GAUZE/BANDAGES/DRESSINGS) ×2 IMPLANT
GAUZE SPONGE 4X4 12PLY STRL LF (GAUZE/BANDAGES/DRESSINGS) ×1 IMPLANT
GAUZE SPONGE 4X4 16PLY XRAY LF (GAUZE/BANDAGES/DRESSINGS) IMPLANT
GLOVE ECLIPSE 9.0 STRL (GLOVE) ×2 IMPLANT
GLOVE EXAM NITRILE LRG STRL (GLOVE) IMPLANT
GLOVE EXAM NITRILE XL STR (GLOVE) IMPLANT
GLOVE EXAM NITRILE XS STR PU (GLOVE) IMPLANT
GOWN STRL REUS W/ TWL LRG LVL3 (GOWN DISPOSABLE) IMPLANT
GOWN STRL REUS W/ TWL XL LVL3 (GOWN DISPOSABLE) IMPLANT
GOWN STRL REUS W/TWL 2XL LVL3 (GOWN DISPOSABLE) IMPLANT
GOWN STRL REUS W/TWL LRG LVL3 (GOWN DISPOSABLE)
GOWN STRL REUS W/TWL XL LVL3 (GOWN DISPOSABLE)
HALTER HD/CHIN CERV TRACTION D (MISCELLANEOUS) ×2 IMPLANT
HEMOSTAT POWDER KIT SURGIFOAM (HEMOSTASIS) IMPLANT
KIT BASIN OR (CUSTOM PROCEDURE TRAY) ×2 IMPLANT
KIT TURNOVER KIT B (KITS) ×2 IMPLANT
NDL SPNL 20GX3.5 QUINCKE YW (NEEDLE) ×1 IMPLANT
NEEDLE SPNL 20GX3.5 QUINCKE YW (NEEDLE) ×2 IMPLANT
NS IRRIG 1000ML POUR BTL (IV SOLUTION) ×2 IMPLANT
OIL CARTRIDGE MAESTRO DRILL (MISCELLANEOUS) ×2
PACK LAMINECTOMY NEURO (CUSTOM PROCEDURE TRAY) ×2 IMPLANT
PAD ARMBOARD 7.5X6 YLW CONV (MISCELLANEOUS) ×6 IMPLANT
PEEK CAGE 7X14X11 (Cage) ×1 IMPLANT
PLATE ELITE VISION 25MM (Plate) ×1 IMPLANT
RUBBERBAND STERILE (MISCELLANEOUS) ×4 IMPLANT
SCREW ST FIX 4 ATL 3120213 (Screw) ×4 IMPLANT
SPONGE INTESTINAL PEANUT (DISPOSABLE) ×2 IMPLANT
SPONGE SURGIFOAM ABS GEL SZ50 (HEMOSTASIS) ×2 IMPLANT
STRIP CLOSURE SKIN 1/2X4 (GAUZE/BANDAGES/DRESSINGS) ×2 IMPLANT
SUT VIC AB 3-0 SH 8-18 (SUTURE) ×2 IMPLANT
SUT VIC AB 4-0 RB1 18 (SUTURE) ×2 IMPLANT
TAPE CLOTH 4X10 WHT NS (GAUZE/BANDAGES/DRESSINGS) ×1 IMPLANT
TAPE CLOTH SURG 4X10 WHT LF (GAUZE/BANDAGES/DRESSINGS) ×1 IMPLANT
TOWEL GREEN STERILE (TOWEL DISPOSABLE) ×2 IMPLANT
TOWEL GREEN STERILE FF (TOWEL DISPOSABLE) ×2 IMPLANT
TRAP SPECIMEN MUCOUS 40CC (MISCELLANEOUS) ×2 IMPLANT
WATER STERILE IRR 1000ML POUR (IV SOLUTION) ×2 IMPLANT

## 2018-01-27 NOTE — Discharge Summary (Signed)
Physician Discharge Summary  Patient ID: Oscar [name] MRN: [id number] goodDOB/AGE: 11-23-1930 84 y.o.  Admit date: 01/27/2018 Discharge date: 01/27/2018  Admission Diagnoses:  Discharge Diagnoses:  Active Problems:   Cervical myelopathy [facility])   Discharged Condition: good  Hospital Course: Patient admitted to the hospital where he underwent uncomplicated N8-2 anterior cervical discectomy and fusion.  Postoperatively he is doing very well.  Preoperative neck pain and weakness very much improved.  Ambulating without difficulty.  Ready for discharge home.  Consults:   Significant Diagnostic Studies:   Treatments:   Discharge Exam: Blood pressure (!) 127/92, pulse 68, temperature (!) 97.4 F (36.3 C), temperature source Oral, resp. rate 18, height 5\' 8"  (1.727 m), weight 77.1 kg (170 lb), SpO2 99 %. Awake and alert.  Oriented and appropriate.  Cranial nerve function intact.  Motor and sensory function extremities normal.  Wound clean and dry.  Chest and abdomen benign.  Neck soft.  Disposition: Discharge disposition: 01-Home or Self Care        Allergies as of 01/27/2018   No Known Allergies     Medication List    TAKE these medications   ALEVE 220 MG tablet Generic drug:  naproxen sodium Take 220 mg by mouth daily as needed (for pain).   aspirin EC 81 MG tablet Take 81 mg by mouth daily.   DRY EYES OP Place 1-2 drops into both eyes 2 (two) times daily as needed (for dry eyes).   GLUCOSAMINE CHONDROITIN JOINT PO Take 2 tablets by mouth daily.   HYDROcodone-acetaminophen 5-325 MG tablet Commonly known as:  NORCO/VICODIN Take 1 tablet by mouth every 4 (four) hours as needed for moderate pain ((score 4 to 6)).   MAGNESIUM PO Take 1 tablet by mouth daily.   meclizine 25 MG tablet Commonly known as:  ANTIVERT Take 25 mg by mouth 3 (three) times daily as needed for dizziness.   omeprazole 20 MG capsule Commonly known as:  PRILOSEC Take 20 mg by mouth every  morning.   POTASSIUM PO Take 1 tablet by mouth daily.        Signed: Cooper Render Patrica Mendell 01/27/2018, 7:03 PM

## 2018-01-27 NOTE — Brief Op Note (Signed)
01/27/2018  9:39 AM  PATIENT:  Raynaldo Opitz Topor  84 y.o. male  PRE-OPERATIVE DIAGNOSIS:  Stenosis  POST-OPERATIVE DIAGNOSIS:  Stenosis  PROCEDURE:  Procedure(s) with comments: Anterior Cervical Discectomy Fusion - Cervical Four- Cervical Five (N/A) - Anterior Cervical Discectomy Fusion - Cervical Four- Cervical Five  SURGEON:  Surgeon(s) and Role:    Earnie Larsson, MD - Primary  PHYSICIAN ASSISTANT:   ASSISTANTS: Nundkumar   ANESTHESIA:   general  EBL:  50 mL   BLOOD ADMINISTERED:none  DRAINS: none   LOCAL MEDICATIONS USED:  NONE  SPECIMEN:  No Specimen  DISPOSITION OF SPECIMEN:  N/A  COUNTS:  YES  TOURNIQUET:  * No tourniquets in log *  DICTATION: .Dragon Dictation  PLAN OF CARE: Admit to inpatient   PATIENT DISPOSITION:  PACU - hemodynamically stable.   Delay start of Pharmacological VTE agent (>24hrs) due to surgical blood loss or risk of bleeding: yes

## 2018-01-27 NOTE — Anesthesia Procedure Notes (Addendum)
Procedure Name: Intubation Date/Time: 01/27/2018 8:37 AM Performed by: Barnet Glasgow, MD Pre-anesthesia Checklist: Patient identified, Emergency Drugs available, Suction available, Patient being monitored and Timeout performed Patient Re-evaluated:Patient Re-evaluated prior to induction Oxygen Delivery Method: Circle system utilized Preoxygenation: Pre-oxygenation with 100% oxygen Induction Type: IV induction Ventilation: Two handed mask ventilation required and Oral airway inserted - appropriate to patient size Laryngoscope Size: Glidescope and 3 Grade View: Grade I Tube type: Oral Tube size: 7.5 mm Number of attempts: 1 Airway Equipment and Method: Video-laryngoscopy and Stylet Placement Confirmation: ETT inserted through vocal cords under direct vision,  positive ETCO2 and breath sounds checked- equal and bilateral Secured at: 22 cm Tube secured with: Tape Dental Injury: Teeth and Oropharynx as per pre-operative assessment

## 2018-01-27 NOTE — H&P (Signed)
Oscar [name] is an 84 y.o. male.   Chief Complaint: Left-sided pain and weakness 84 year old male with progressive neck pain with HPI: Bilateral upper extremity numbness paresthesias and weakness left much greater than right.  Work-up demonstrates evidence of severe cervical stenosis with spinal cord compression and spinal cord signal abnormality at C4-5.  Patient is here now for C4-5 anterior cervical discectomy and interbody fusion in hopes of improving his symptoms.  Past Medical History:  Diagnosis Date  . Bladder tumor   . Cervical stenosis of spine   . GERD (gastroesophageal reflux disease)   . History of appendectomy   . History of concussion    X2   NO RESIDUALS  . History of peptic ulcer 1970'S  . Hyperlipidemia   . Irregular heart beat   . LBBB (left bundle branch block)   . Status post placement of implantable loop recorder 11/08/2014    Past Surgical History:  Procedure Laterality Date  . APPENDECTOMY  1970  . CARDIOVASCULAR STRESS TEST  03/22/09  . CYSTOSCOPY WITH BIOPSY N/A 02/10/2013   Procedure: CYSTOSCOPY WITH COLD CUP BIOPSY/FULGERATION;  Surgeon: Bernestine Amass, MD;  Location: [facility];  Service: Urology;  Laterality: N/A;  ALSO FULGERATION   . INGUINAL HERNIA REPAIR Right 07-14-2002  . LOOP RECORDER IMPLANT N/A 05/24/2014   Procedure: LOOP RECORDER IMPLANT;  Surgeon: Sanda Klein, MD;  Location: [facility]ATH [facility];  Service: Cardiovascular;  Laterality: N/A;  . TONSILLECTOMY    . TRANSURETHRAL RESECTION OF BLADDER TUMOR  03-10-2008    Family History  Problem Relation Age of Onset  . Heart failure Mother   . Stroke Father   . Diabetes Brother   . Heart failure Brother    Social History:  reports that he quit smoking about 39 years ago. His smoking use included cigarettes. He has a 15.00 pack-year smoking history. He has never used smokeless tobacco. He reports that he does not drink alcohol or use drugs.  Allergies: No Known  Allergies  Medications Prior to Admission  Medication Sig Dispense Refill  . Artificial Tear Ointment (DRY EYES OP) Place 1-2 drops into both eyes 2 (two) times daily as needed (for dry eyes).    Marland Kitchen aspirin EC 81 MG tablet Take 81 mg by mouth daily.    . Glucos-Chondroit-Hyaluron-MSM (GLUCOSAMINE CHONDROITIN JOINT PO) Take 2 tablets by mouth daily.     Marland Kitchen MAGNESIUM PO Take 1 tablet by mouth daily.    . meclizine (ANTIVERT) 25 MG tablet Take 25 mg by mouth 3 (three) times daily as needed for dizziness.    . naproxen sodium (ALEVE) 220 MG tablet Take 220 mg by mouth daily as needed (for pain).     Marland Kitchen omeprazole (PRILOSEC) 20 MG capsule Take 20 mg by mouth every morning.    Marland Kitchen POTASSIUM PO Take 1 tablet by mouth daily.      No results found for this or any previous visit (from the past 48 hour(s)). No results found.  Pertinent items noted in HPI and remainder of comprehensive ROS otherwise negative.  There were no vitals taken for this visit.  Patient is awake and alert.  He is oriented and appropriate.  Speech is fluent.  Judgment and insight are intact.  Motor examination reveals weakness of his left-sided deltoid muscle group grading out at 4-/5.  He has weakness of grips and intrinsics bilaterally.  He has spasticity in both lower extremities.  Sensory examination with decreased sensation in both distal upper  extremities.  Reflexes hyperactive.  Hoffmann's response is present bilaterally.  Toes are equivocal to plantar stimulation.  Examination head ears eyes nose throat is unremarkable her chest and abdomen are benign.  Extremities are free from injury deformity. Assessment/Plan C4-5 stenosis with myelopathy.  Plan C4-5 anterior cervical discectomy with interbody fusion utilizing interbody peek cage, locally harvested autograft, and anterior plate instrumentation.  Risks and benefits of been explained.  Patient wishes to proceed.  Oscar [name] A Robert Sperl 01/27/2018, 7:45 AM

## 2018-01-27 NOTE — Discharge Instructions (Signed)
Wound Care Keep incision covered and dry for two days.  Do not put any creams, lotions, or ointments on incision. Leave steri-strips on neck.  They will fall off by themselves. Activity Walk each and every day, increasing distance each day. No lifting greater than 5 lbs.  Avoid excessive neck motion. No driving for 2 weeks; may ride as a passenger locally. If provided with back brace, wear when out of bed.  It is not necessary to wear brace in bed. Diet Resume your normal diet.  Return to Work Will be discussed at you follow up appointment. Call Your Doctor If Any of These Occur Redness, drainage, or swelling at the wound.  Temperature greater than 101 degrees. Severe pain not relieved by pain medication. Incision starts to come apart. Follow Up Appt Call today for appointment in 1-2 weeks ([phone]) or for problems.  If you have any hardware placed in your spine, you will need an x-ray before your appointment.  

## 2018-01-27 NOTE — Op Note (Signed)
Date of procedure: 01/27/2018  Date of dictation: Same  Service: Neurosurgery  Preoperative diagnosis: C4-5 stenosis with myelopathy and radiculopathy  Postoperative diagnosis: Same  Procedure Name: C4-5 anterior cervical discectomy with interbody fusion utilizing interbody peek cages, locally harvested autograft, and anterior plate instrumentation.  Surgeon:Renarda Mullinix A.Braxtyn Dorff, M.D.  Asst. Surgeon: Kathyrn Sheriff  Anesthesia: General  Indication: 84 year old male with severe left upper extremity weakness.  Work-up demonstrates evidence of critical spinal stenosis with spinal cord signal abnormality and marked foraminal stenosis at C4-5.  Patient presents now for C4-5 anterior cervical decompression and fusion in hopes of improving his symptoms.  Operative note: After induction of anesthesia, patient position supine with neck slightly extended and held in place of Holter traction.  Patient's anterior cervical region prepped and draped sterilely.  Incision made overlying C4-5.  Dissection performed on the right.  Retractor placed.  Fluoroscopy used.  Levels confirmed.  The space incised.  Discectomy then performed using various instruments down to the level of the posterior annulus.  Microscope was then brought to the field used throughout the remainder of the discectomy.  Remaining aspects of annulus and osteophytes removed using high-speed drill down to level the posterior longitudinal ligament.  Posterior logical ligament was then elevated and resected in a piecemeal fashion.  Underlying thecal sac was then identified.  A wide central decompression was then performed undercutting the bodies of C4 and C5.  Decompression then proceeded into each neural foramen.  Wide anterior foraminotomies were performed on the course exiting C5 nerve roots bilaterally.  At this point a very thorough decompression had been achieved.  There was no evidence of injury to the thecal sac or nerve roots.  Wound was then irrigated  fanlike solution.  Gelfoam was placed topically for hemostasis then removed.  7 mm [employer]tronic anatomic peek cage packed with locally harvested autograft was then impacted in place and recessed slightly from the anterior cortical margin.  25 mm Atlantis anterior cervical plate was then placed over the C4 and C5 levels.  This then attached under fluoroscopic guidance using 13 mm fixed angle screws to each of both levels.  All 4 screws given final tightening found to be solidly within the bone.  Locking screws engaged both levels.  Final images reveal good position of the cages and the hardware at the proper operative level with normal alignment of the spine.  Wound was then irrigated one final time.  Hemostasis was assured with bipolar cautery.  Wounds and closed in layers with Vicryl sutures.  Steri-Strips and sterile dressing were applied.  No apparent complications.  Patient tolerated the procedure well and he returns to the recovery room postop.

## 2018-01-27 NOTE — Progress Notes (Signed)
Patient discharged to home accompanied by family member. Prescription given to patient and discharge instructions also explained and verbalized understanding.  In no signs and symptoms of distress at the time of discharge.

## 2018-01-27 NOTE — Anesthesia Preprocedure Evaluation (Addendum)
Anesthesia Evaluation  Patient identified by MRN, date of birth, ID band Patient awake    Reviewed: Allergy & Precautions, NPO status , Patient's Chart, lab work & pertinent test results  Airway Mallampati: II  TM Distance: >3 FB Neck ROM: Full    Dental no notable dental hx.    Pulmonary neg pulmonary ROS, former smoker,    Pulmonary exam normal breath sounds clear to auscultation       Cardiovascular Exercise Tolerance: Good Normal cardiovascular exam Rhythm:Regular Rate:Normal  Nuclear stress test 01/21/18:   The left ventricular ejection fraction is mildly decreased (45-54%).  Nuclear stress EF: 52%.  There was no ST segment deviation noted during stress.  There is a medium defect of moderate severity present in the basal inferoseptal, basal inferior, mid inferoseptal, mid inferior and apical inferior location. The defect is non-reversible. In the setting of normal LVF, this is consistent with diaphragmatic attenuation artifact. Septal perfusion defect likely related to LBBB. No ischemia noted.  This is a low risk study.     Neuro/Psych negative neurological ROS  negative psych ROS   GI/Hepatic Neg liver ROS, GERD  Medicated,  Endo/Other  negative endocrine ROS  Renal/GU negative Renal ROS  negative genitourinary   Musculoskeletal negative musculoskeletal ROS (+)   Abdominal   Peds  Hematology negative hematology ROS (+)   Anesthesia Other Findings   Reproductive/Obstetrics negative OB ROS                            Lab Results  Component Value Date   WBC 5.0 01/20/2018   HGB 14.3 01/20/2018   HCT 43.0 01/20/2018   MCV 94.7 01/20/2018   PLT 243 01/20/2018    Anesthesia Physical Anesthesia Plan  ASA: III  Anesthesia Plan: General   Post-op Pain Management:    Induction: Intravenous  PONV Risk Score and Plan: Treatment may vary due to age or medical condition and  Ondansetron  Airway Management Planned: Oral ETT  Additional Equipment:   Intra-op Plan:   Post-operative Plan: Extubation in OR  Informed Consent: I have reviewed the patients History and Physical, chart, labs and discussed the procedure including the risks, benefits and alternatives for the proposed anesthesia with the patient or authorized representative who has indicated his/her understanding and acceptance.   Dental advisory given  Plan Discussed with: CRNA  Anesthesia Plan Comments:         Anesthesia Quick Evaluation

## 2018-01-27 NOTE — Anesthesia Postprocedure Evaluation (Signed)
Anesthesia Post Note  Patient: Oscar [name]  Procedure(s) Performed: Anterior Cervical Discectomy Fusion - Cervical Four- Cervical Five (N/A )     Patient location during evaluation: PACU Anesthesia Type: General Level of consciousness: awake and alert Pain management: pain level controlled Vital Signs Assessment: post-procedure vital signs reviewed and stable Respiratory status: spontaneous breathing, nonlabored ventilation, respiratory function stable and patient connected to nasal cannula oxygen Cardiovascular status: blood pressure returned to baseline and stable Postop Assessment: no apparent nausea or vomiting Anesthetic complications: no    Last Vitals:  Vitals:   01/27/18 1036 01/27/18 1051  BP: (!) 145/72 137/68  Pulse: (!) 59 (!) 57  Resp: 16 16  Temp:    SpO2: 100% 100%    Last Pain:  Vitals:   01/27/18 1050  TempSrc:   PainSc: 1     LLE Motor Response: Purposeful movement;Responds to commands (01/27/18 1050) LLE Sensation: No numbness;No tingling (01/27/18 1050) RLE Motor Response: Purposeful movement;Responds to commands (01/27/18 1050) RLE Sensation: No numbness;No tingling (01/27/18 1050)      Barnet Glasgow

## 2018-01-27 NOTE — Evaluation (Signed)
Physical Therapy Evaluation Patient Details Name: Oscar [name] MRN: [id number] DOB: 01-21-1931 Today's Date: 01/27/2018   History of Present Illness  Patient is an 84 yo male s/p Anterior Cervical Discectomy Fusion - Cervical Four- Cervical Five.  Clinical Impression  Patient seen for mobility assessment s/p spinal surgery. Mobilizing well. Educated patient on precautions, mobility expectations, safety and car transfers. No further acute PT needs. Will sign off.     Follow Up Recommendations No PT follow up    Equipment Recommendations  None recommended by PT    Recommendations for Other Services       Precautions / Restrictions Precautions Precautions: Cervical Precaution Booklet Issued: Yes (comment) Precaution Comments: provided hand out and verbally reviewed Required Braces or Orthoses: Cervical Brace Cervical Brace: Soft collar Restrictions Weight Bearing Restrictions: No      Mobility  Bed Mobility Overal bed mobility: Modified Independent             General bed mobility comments: HOB elevated  Transfers Overall transfer level: Independent Equipment used: None                Ambulation/Gait Ambulation/Gait assistance: Independent Ambulation Distance (Feet): 440 Feet Assistive device: None Gait Pattern/deviations: WFL(Within Functional Limits)     General Gait Details: steady with ambulation  Stairs Stairs: Yes Stairs assistance: Supervision Stair Management: One rail Right Number of Stairs: 12 General stair comments: Verbally educated patient on precautions and technique for blind navigation  Wheelchair Mobility    Modified Rankin (Stroke Patients Only)       Balance Overall balance assessment: Mild deficits observed, not formally tested                                           Pertinent Vitals/Pain Pain Assessment: No/denies pain    Home Living Family/patient expects to be discharged to:: Private  residence Living Arrangements: Spouse/significant other;Children Available Help at Discharge: Family Type of Home: House Home Access: Stairs to enter Entrance Stairs-Rails: Can reach both Entrance Stairs-Number of Steps: 4 Home Layout: Two level        Prior Function Level of Independence: Independent               Hand Dominance   Dominant Hand: Right    Extremity/Trunk Assessment   Upper Extremity Assessment Upper Extremity Assessment: Defer to OT evaluation    Lower Extremity Assessment Lower Extremity Assessment: Overall WFL for tasks assessed    Cervical / Trunk Assessment Cervical / Trunk Assessment: (s/p cervical surgery)  Communication   Communication: No difficulties  Cognition Arousal/Alertness: Awake/alert Behavior During Therapy: WFL for tasks assessed/performed Overall Cognitive Status: Within Functional Limits for tasks assessed                                        General Comments      Exercises     Assessment/Plan    PT Assessment Patent does not need any further PT services  PT Problem List         PT Treatment Interventions      PT Goals (Current goals can be found in the Care Plan section)  Acute Rehab PT Goals PT Goal Formulation: All assessment and education complete, DC therapy    Frequency     Barriers to  discharge        Co-evaluation               AM-PAC PT "6 Clicks" Daily Activity  Outcome Measure Difficulty turning over in bed (including adjusting bedclothes, sheets and blankets)?: None Difficulty moving from lying on back to sitting on the side of the bed? : A Little Difficulty sitting down on and standing up from a chair with arms (e.g., wheelchair, bedside commode, etc,.)?: None Help needed moving to and from a bed to chair (including a wheelchair)?: None Help needed walking in hospital room?: None Help needed climbing 3-5 steps with a railing? : A Little 6 Click Score: 22    End  of Session Equipment Utilized During Treatment: Gait belt;Cervical collar Activity Tolerance: No increased pain;Patient tolerated treatment well Patient left: in bed;with call bell/phone within reach Nurse Communication: Mobility status PT Visit Diagnosis: Other symptoms and signs involving the nervous system (R29.898)    Time: 4765-4650 PT Time Calculation (min) (ACUTE ONLY): 18 min   Charges:   PT Evaluation $PT Eval Low Complexity: 1 Low     PT G Codes:        Alben Deeds, PT DPT  Board Certified Neurologic Specialist [phone]   Duncan Dull 01/27/2018, 3:18 PM

## 2018-01-27 NOTE — Transfer of Care (Signed)
Immediate Anesthesia Transfer of Care Note  Patient: Oscar [name]  Procedure(s) Performed: Anterior Cervical Discectomy Fusion - Cervical Four- Cervical Five (N/A )  Patient Location: PACU  Anesthesia Type:General  Level of Consciousness: awake, alert , oriented, drowsy and patient cooperative  Airway & Oxygen Therapy: Patient Spontanous Breathing and Patient connected to nasal cannula oxygen  Post-op Assessment: Report given to RN and Post -op Vital signs reviewed and stable  Post vital signs: Reviewed and stable  Last Vitals:  Vitals Value Taken Time  BP    Temp    Pulse 58 01/27/2018  9:50 AM  Resp 11 01/27/2018  9:50 AM  SpO2 99 % 01/27/2018  9:50 AM  Vitals shown include unvalidated device data.  Last Pain:  Vitals:   01/27/18 0800  TempSrc: Oral  PainSc:       Patients Stated Pain Goal: 3 (94/49/67 5916)  Complications: No apparent anesthesia complications

## 2018-01-28 ENCOUNTER — Telehealth: Payer: Self-pay | Admitting: Physician Assistant

## 2018-01-28 ENCOUNTER — Encounter (HOSPITAL_COMMUNITY): Payer: Self-pay | Admitting: Neurosurgery

## 2018-01-28 MED FILL — Thrombin For Soln 5000 Unit: CUTANEOUS | Qty: 2 | Status: AC

## 2018-01-28 NOTE — Telephone Encounter (Signed)
New Message   Patient is returning call in reference to myocardial perfusion. Please call.

## 2018-01-28 NOTE — Telephone Encounter (Signed)
Spoke with pt and informed of stress test result. Pt verbalized understanding.

## 2018-07-20 ENCOUNTER — Other Ambulatory Visit: Payer: Self-pay

## 2018-07-20 ENCOUNTER — Emergency Department (HOSPITAL_BASED_OUTPATIENT_CLINIC_OR_DEPARTMENT_OTHER): Payer: Medicare Other

## 2018-07-20 ENCOUNTER — Encounter (HOSPITAL_BASED_OUTPATIENT_CLINIC_OR_DEPARTMENT_OTHER): Payer: Self-pay

## 2018-07-20 ENCOUNTER — Emergency Department (HOSPITAL_BASED_OUTPATIENT_CLINIC_OR_DEPARTMENT_OTHER)
Admission: EM | Admit: 2018-07-20 | Discharge: 2018-07-20 | Disposition: A | Discharge status: Home / Self Care | Payer: Medicare Other | Attending: Emergency Medicine | Admitting: Emergency Medicine

## 2018-07-20 DIAGNOSIS — Z7982 Long term (current) use of aspirin: Secondary | ICD-10-CM | POA: Insufficient documentation

## 2018-07-20 DIAGNOSIS — Z87891 Personal history of nicotine dependence: Secondary | ICD-10-CM | POA: Insufficient documentation

## 2018-07-20 DIAGNOSIS — M25512 Pain in left shoulder: Secondary | ICD-10-CM | POA: Insufficient documentation

## 2018-07-20 DIAGNOSIS — Z95818 Presence of other cardiac implants and grafts: Secondary | ICD-10-CM | POA: Insufficient documentation

## 2018-07-20 DIAGNOSIS — M25511 Pain in right shoulder: Secondary | ICD-10-CM | POA: Diagnosis not present

## 2018-07-20 DIAGNOSIS — Z79899 Other long term (current) drug therapy: Secondary | ICD-10-CM | POA: Insufficient documentation

## 2018-07-20 NOTE — ED Triage Notes (Addendum)
C/o pain to left UE x 2 weeks and right UE x 1 week-denies injury-NAD-steady gait

## 2018-07-20 NOTE — Discharge Instructions (Addendum)
Contact a health care provider if: Your condition does not improve with treatment. Get help right away if: Your pain gets much worse and cannot be controlled with medicines. You have weakness or numbness in your hand, arm, face, or leg. You have a high fever. You have a stiff, rigid neck. You lose control of your bowels or your bladder (have incontinence). You have trouble with walking, balance, or speaking.

## 2018-07-20 NOTE — ED Provider Notes (Signed)
[facility] EMERGENCY DEPARTMENT Provider Note   CSN: [id number] Arrival date & time: 07/20/18  1122     History   Chief Complaint Chief Complaint  Patient presents with  . Arm Pain    HPI Oscar [name] is a 84 y.o. male with a previous history of cervical stenosis and compressive myelopathy requiring surgical repair who presents the emergency department chief complaint of bilateral shoulder pain and weakness.  Patient states that for the past 2 weeks he has noticed aching pain in his left shoulder and now has it in his right over the past 2 days.  He is also noticed weakness with internal rotation of the arm which is markedly pronounced today when he was unable to pull his pants up or talk to his shirt and in the back of his pants.  He decided to come to emergency department for evaluation.  He denied denies numbness or tingling he denies atrophy of his muscles.  He denies grip strength loss.  He denies any known injuries.  The pain is not made worse with movement.  HPI  Past Medical History:  Diagnosis Date  . Bladder tumor   . Cervical stenosis of spine   . GERD (gastroesophageal reflux disease)   . History of appendectomy   . History of concussion    X2   NO RESIDUALS  . History of peptic ulcer 1970'S  . Hyperlipidemia   . Irregular heart beat   . LBBB (left bundle branch block)   . Status post placement of implantable loop recorder 11/08/2014    Patient Active Problem List   Diagnosis Date Noted  . Cervical myelopathy ([place]) 01/27/2018  . Status post placement of implantable loop recorder 11/08/2014  . Near syncope 05/16/2014  . Cancer of bladder ([place]) 02/10/2013    Past Surgical History:  Procedure Laterality Date  . ANTERIOR CERVICAL DECOMP/DISCECTOMY FUSION N/A 01/27/2018   Procedure: Anterior Cervical Discectomy Fusion - Cervical Four- Cervical Five;  Surgeon: Earnie Larsson, MD;  Location: [facility];  Service: Neurosurgery;  Laterality: N/A;  Anterior  Cervical Discectomy Fusion - Cervical Four- Cervical Five  . APPENDECTOMY  1970  . CARDIOVASCULAR STRESS TEST  03/22/09  . CYSTOSCOPY WITH BIOPSY N/A 02/10/2013   Procedure: CYSTOSCOPY WITH COLD CUP BIOPSY/FULGERATION;  Surgeon: Bernestine Amass, MD;  Location: [facility];  Service: Urology;  Laterality: N/A;  ALSO FULGERATION   . INGUINAL HERNIA REPAIR Right 07-14-2002  . LOOP RECORDER IMPLANT N/A 05/24/2014   Procedure: LOOP RECORDER IMPLANT;  Surgeon: Sanda Klein, MD;  Location: [facility] CATH LAB;  Service: Cardiovascular;  Laterality: N/A;  . TONSILLECTOMY    . TRANSURETHRAL RESECTION OF BLADDER TUMOR  03-10-2008        Home Medications    Prior to Admission medications   Medication Sig Start Date End Date Taking? Authorizing Provider  Artificial Tear Ointment (DRY EYES OP) Place 1-2 drops into both eyes 2 (two) times daily as needed (for dry eyes).    [provider]  aspirin EC 81 MG tablet Take 81 mg by mouth daily.    [provider]  Glucos-Chondroit-Hyaluron-MSM (GLUCOSAMINE CHONDROITIN JOINT PO) Take 2 tablets by mouth daily.     [provider]  HYDROcodone-acetaminophen (NORCO/VICODIN) 5-325 MG tablet Take 1 tablet by mouth every 4 (four) hours as needed for moderate pain ((score 4 to 6)). 01/27/18   Earnie Larsson, MD  MAGNESIUM PO Take 1 tablet by mouth daily.    [provider]  meclizine (ANTIVERT) 25 MG tablet Take 25 mg by mouth 3 (three) times daily as needed for dizziness.    [provider]  naproxen sodium (ALEVE) 220 MG tablet Take 220 mg by mouth daily as needed (for pain).     [provider]  omeprazole (PRILOSEC) 20 MG capsule Take 20 mg by mouth every morning.    [provider]  POTASSIUM PO Take 1 tablet by mouth daily.    [provider]    Family History Family History  Problem Relation Age of Onset  . Heart failure Mother   . Stroke Father   . Diabetes Brother   . Heart  failure Brother     Social History Social History   Tobacco Use  . Smoking status: Former Smoker    Packs/day: 0.50    Years: 30.00    Pack years: 15.00    Types: Cigarettes    Last attempt to quit: 02/06/1979    Years since quitting: 39.4  . Smokeless tobacco: Never Used  Substance Use Topics  . Alcohol use: No    Alcohol/week: 0.0 standard drinks  . Drug use: No     Allergies   Patient has no known allergies.   Review of Systems Review of Systems  Ten systems reviewed and are negative for acute change, except as noted in the HPI.   Physical Exam Updated Vital Signs BP 137/86 (BP Location: Left Arm)   Pulse 64   Temp 98.5 F (36.9 C) (Oral)   Resp 18   Ht 5\' 8"  (1.727 m)   Wt 74.8 kg   SpO2 100%   BMI 25.09 kg/m   Physical Exam  Constitutional: He is oriented to person, place, and time. He appears well-developed and well-nourished. No distress.  HENT:  Head: Normocephalic and atraumatic.  Eyes: Conjunctivae are normal. No scleral icterus.  Neck: Normal range of motion. Neck supple.  Cardiovascular: Normal rate, regular rhythm and normal heart sounds.  Pulmonary/Chest: Effort normal and breath sounds normal. No respiratory distress.  Abdominal: Soft. There is no tenderness.  Musculoskeletal: He exhibits no edema, tenderness or deformity.  Full range of motion and strength at the shoulder joint with flexion, extension, abduction, abduction internal and external rotation Normal strength with bicep flexion and triceps extension.  Normal wrist strength and range of motion bilaterally.  Equal and bilateral grip strength. No significant tenderness over the deltoid, biceps tendon, AC joint bilaterally.  Negative crossarm test.  Neurological: He is alert and oriented to person, place, and time.  Skin: Skin is warm and dry. He is not diaphoretic.  Psychiatric: His behavior is normal.  Nursing note and vitals reviewed.    ED Treatments / Results  Labs (all labs  ordered are listed, but only abnormal results are displayed) Labs Reviewed - No data to display  EKG EKG Interpretation  Date/Time:  Monday July 20 2018 11:37:02 EDT Ventricular Rate:  60 PR Interval:  218 QRS Duration: 134 QT Interval:  444 QTC Calculation: 444 R Axis:   5 Text Interpretation:  Sinus rhythm with 1st degree A-V block Left bundle branch block Abnormal ECG When compared to prior, similar LBBB but t wave inversions now upright in leads 1 and AVL.  No STEMI Confirmed by Antony Blackbird [phone]) on 07/20/2018 11:39:56 AM   Radiology Dg Shoulder Right  Result Date: 07/20/2018 CLINICAL DATA:  C/o pain to left UE x 2 weeks and right UE x 1 week-denies injury-pt also c/o bilateral  decreased range of motion EXAM: RIGHT SHOULDER - 2+ VIEW COMPARISON:  None. FINDINGS: Mild spurring at the [facility] joint. Glenohumeral joint alignment normal. No fracture or acute bony findings. No unexpected foreign body. IMPRESSION: 1. No acute findings.  Mild spurring at the [facility] joint. Electronically Signed   By: Van Clines M.D.   On: 07/20/2018 12:53   Dg Shoulder Left  Result Date: 07/20/2018 CLINICAL DATA:  84 year old male with a history of left upper extremity pain EXAM: LEFT SHOULDER - 2+ VIEW COMPARISON:  None. FINDINGS: No acute displaced fracture. Degenerative changes of the acromioclavicular joint and glenohumeral joint. Glenohumeral joint appears congruent. Unremarkable scapular Y-view. IMPRESSION: Negative for acute bony abnormality Electronically Signed   By: Corrie Mckusick D.O.   On: 07/20/2018 12:52    Procedures Procedures (including critical care time)  Medications Ordered in ED Medications - No data to display   Initial Impression / Assessment and Plan / ED Course  I have reviewed the triage vital signs and the nursing notes.  Pertinent labs & imaging results that were available during my care of the patient were reviewed by me and considered in my medical decision making  (see chart for details).     Patient patient with a history of compressive myelopathy.  I discussed the case with Dr. Deri Fuelling who asks for a plain film clearing x-ray to make sure his hardware is in place.  He states he does not need an emergent MRI at this time.  Patient x-ray negative for acute abnormality.  I personally reviewed both the bilateral shoulder and neck x-rays which show no significant for acute abnormality at this time.  Patient is advised to follow-up with Dr. Trenton Gammon this week.  He may need further imaging.  I discussed return precautions.  He does not appear to have any significant atrophy or weakness on my examination appears appropriate for discharge at this time.  Final Clinical Impressions(s) / ED Diagnoses   Final diagnoses:  None    ED Discharge Orders    None       Margarita Mail, PA-C 07/20/18 1948    Tegeler, Gwenyth Allegra, MD 07/22/18 [phone]

## 2018-08-06 ENCOUNTER — Encounter (HOSPITAL_COMMUNITY): Payer: Self-pay

## 2018-08-06 ENCOUNTER — Other Ambulatory Visit: Payer: Self-pay

## 2018-08-06 ENCOUNTER — Emergency Department (HOSPITAL_COMMUNITY)
Admission: EM | Admit: 2018-08-06 | Discharge: 2018-08-06 | Disposition: A | Discharge status: Home / Self Care | Payer: Medicare Other | Attending: Emergency Medicine | Admitting: Emergency Medicine

## 2018-08-06 DIAGNOSIS — R197 Diarrhea, unspecified: Secondary | ICD-10-CM | POA: Diagnosis present

## 2018-08-06 DIAGNOSIS — B9621 Shiga toxin-producing Escherichia coli [E. coli] (STEC) O157 as the cause of diseases classified elsewhere: Secondary | ICD-10-CM | POA: Diagnosis not present

## 2018-08-06 DIAGNOSIS — Z79899 Other long term (current) drug therapy: Secondary | ICD-10-CM | POA: Insufficient documentation

## 2018-08-06 DIAGNOSIS — Z87891 Personal history of nicotine dependence: Secondary | ICD-10-CM | POA: Diagnosis not present

## 2018-08-06 DIAGNOSIS — Z7982 Long term (current) use of aspirin: Secondary | ICD-10-CM | POA: Diagnosis not present

## 2018-08-06 DIAGNOSIS — A498 Other bacterial infections of unspecified site: Secondary | ICD-10-CM

## 2018-08-06 LAB — COMPREHENSIVE METABOLIC PANEL
ALK PHOS: 66 U/L (ref 38–126)
ALT: 15 U/L (ref 0–44)
AST: 18 U/L (ref 15–41)
Albumin: 3.4 g/dL — ABNORMAL LOW (ref 3.5–5.0)
Anion gap: 9 (ref 5–15)
BUN: 14 mg/dL (ref 8–23)
CALCIUM: 9.3 mg/dL (ref 8.9–10.3)
CO2: 24 mmol/L (ref 22–32)
CREATININE: 1.09 mg/dL (ref 0.61–1.24)
Chloride: 104 mmol/L (ref 98–111)
GFR calc non Af Amer: 60 mL/min — ABNORMAL LOW (ref >60)
Glucose, Bld: 87 mg/dL (ref 70–99)
Potassium: 3.6 mmol/L (ref 3.5–5.1)
Sodium: 137 mmol/L (ref 135–145)
Total Bilirubin: 0.4 mg/dL (ref 0.3–1.2)
Total Protein: 6.5 g/dL (ref 6.5–8.1)

## 2018-08-06 LAB — URINALYSIS, ROUTINE W REFLEX MICROSCOPIC
Bacteria, UA: NONE SEEN
Bilirubin Urine: NEGATIVE
Glucose, UA: NEGATIVE mg/dL
Ketones, ur: NEGATIVE mg/dL
LEUKOCYTES UA: NEGATIVE
Nitrite: NEGATIVE
PROTEIN: NEGATIVE mg/dL
SPECIFIC GRAVITY, URINE: 1.016 (ref 1.005–1.030)
pH: 5 (ref 5.0–8.0)

## 2018-08-06 LAB — CBC WITH DIFFERENTIAL/PLATELET
Abs Immature Granulocytes: 0.02 10*3/uL (ref 0.00–0.07)
BASOS PCT: 1 %
Basophils Absolute: 0 10*3/uL (ref 0.0–0.1)
EOS PCT: 5 %
Eosinophils Absolute: 0.2 10*3/uL (ref 0.0–0.5)
HCT: 40.9 % (ref 39.0–52.0)
HEMOGLOBIN: 12.9 g/dL — AB (ref 13.0–17.0)
Immature Granulocytes: 0 %
Lymphocytes Relative: 17 %
Lymphs Abs: 0.9 10*3/uL (ref 0.7–4.0)
MCH: 29.5 pg (ref 26.0–34.0)
MCHC: 31.5 g/dL (ref 30.0–36.0)
MCV: 93.6 fL (ref 80.0–100.0)
MONO ABS: 0.7 10*3/uL (ref 0.1–1.0)
Monocytes Relative: 14 %
Neutro Abs: 3.4 10*3/uL (ref 1.7–7.7)
Neutrophils Relative %: 63 %
PLATELETS: 281 10*3/uL (ref 150–400)
RBC: 4.37 MIL/uL (ref 4.22–5.81)
RDW: 12.6 % (ref 11.5–15.5)
WBC: 5.3 10*3/uL (ref 4.0–10.5)
nRBC: 0 % (ref 0.0–0.2)

## 2018-08-06 LAB — I-STAT CG4 LACTIC ACID, ED
Lactic Acid, Venous: 2.24 mmol/L (ref 0.5–1.9)
Lactic Acid, Venous: 1.22 mmol/L (ref 0.5–1.9)

## 2018-08-06 LAB — LIPASE, BLOOD: Lipase: 34 U/L (ref 11–51)

## 2018-08-06 LAB — PROTIME-INR
INR: 1.09
PROTHROMBIN TIME: 14 s (ref 11.4–15.2)

## 2018-08-06 MED ORDER — CIPROFLOXACIN HCL 500 MG PO TABS: 500.0000 mg | ORAL_TABLET | Freq: Two times a day (BID) | ORAL | 0 refills | Status: AC

## 2018-08-06 MED ORDER — SODIUM CHLORIDE 0.9 % IV BOLUS
1000.0000 mL | Freq: Once | INTRAVENOUS | Status: AC
  Administered 2018-08-06: 1000 mL via INTRAVENOUS

## 2018-08-06 MED ORDER — SODIUM CHLORIDE 0.9 % IV SOLN
1.0000 g | Freq: Once | INTRAVENOUS | Status: AC
  Administered 2018-08-06: 1 g via INTRAVENOUS
  Filled 2018-08-06: qty 10

## 2018-08-06 NOTE — ED Notes (Signed)
ED Provider at bedside. 

## 2018-08-06 NOTE — ED Notes (Signed)
Patient verbalizes understanding of discharge instructions. Opportunity for questioning and answers were provided. Armband removed by staff, pt discharged from ED.  

## 2018-08-06 NOTE — ED Notes (Signed)
Stool sample obtained if needed.

## 2018-08-06 NOTE — ED Provider Notes (Signed)
[facility] DEPARTMENT Provider Note   CSN: [id number] Arrival date & time: 08/06/18  1102     History   Chief Complaint Chief Complaint  Patient presents with  . Diarrhea    HPI Oscar [name] is a 84 y.o. male.  HPI Patient has had diarrhea for almost 2 weeks now.  He does not recall any specific source however he reports that some of the food may have gone bad.  He has had copious amounts of diarrhea but did not have abdominal pain.  He reports there is been no blood.  He has not had any vomiting.  He was tested by his PCP and was told that he had a positive stool test.  He was advised to come to the emergency department. Past Medical History:  Diagnosis Date  . Bladder tumor   . Cervical stenosis of spine   . GERD (gastroesophageal reflux disease)   . History of appendectomy   . History of concussion    X2   NO RESIDUALS  . History of peptic ulcer 1970'S  . Hyperlipidemia   . Irregular heart beat   . LBBB (left bundle branch block)   . Status post placement of implantable loop recorder 11/08/2014    Patient Active Problem List   Diagnosis Date Noted  . Cervical myelopathy ([place]) 01/27/2018  . Status post placement of implantable loop recorder 11/08/2014  . Near syncope 05/16/2014  . Cancer of bladder ([place]) 02/10/2013    Past Surgical History:  Procedure Laterality Date  . ANTERIOR CERVICAL DECOMP/DISCECTOMY FUSION N/A 01/27/2018   Procedure: Anterior Cervical Discectomy Fusion - Cervical Four- Cervical Five;  Surgeon: Earnie Larsson, MD;  Location: [facility];  Service: Neurosurgery;  Laterality: N/A;  Anterior Cervical Discectomy Fusion - Cervical Four- Cervical Five  . APPENDECTOMY  1970  . CARDIOVASCULAR STRESS TEST  03/22/09  . CYSTOSCOPY WITH BIOPSY N/A 02/10/2013   Procedure: CYSTOSCOPY WITH COLD CUP BIOPSY/FULGERATION;  Surgeon: Bernestine Amass, MD;  Location: [facility];  Service: Urology;  Laterality: N/A;  ALSO  FULGERATION   . INGUINAL HERNIA REPAIR Right 07-14-2002  . LOOP RECORDER IMPLANT N/A 05/24/2014   Procedure: LOOP RECORDER IMPLANT;  Surgeon: Sanda Klein, MD;  Location: [facility];  Service: Cardiovascular;  Laterality: N/A;  . TONSILLECTOMY    . TRANSURETHRAL RESECTION OF BLADDER TUMOR  03-10-2008        Home Medications    Prior to Admission medications   Medication Sig Start Date End Date Taking? Authorizing Provider  Artificial Tear Ointment (DRY EYES OP) Place 1-2 drops into both eyes 2 (two) times daily as needed (for dry eyes).    [provider]  aspirin EC 81 MG tablet Take 81 mg by mouth daily.    [provider]  ciprofloxacin (CIPRO) 500 MG tablet Take 1 tablet (500 mg total) by mouth every 12 (twelve) hours for 3 days. 08/06/18 08/09/18  Charlesetta Shanks, MD  Glucos-Chondroit-Hyaluron-MSM (GLUCOSAMINE CHONDROITIN JOINT PO) Take 2 tablets by mouth daily.     [provider]  HYDROcodone-acetaminophen (NORCO/VICODIN) 5-325 MG tablet Take 1 tablet by mouth every 4 (four) hours as needed for moderate pain ((score 4 to 6)). 01/27/18   Earnie Larsson, MD  MAGNESIUM PO Take 1 tablet by mouth daily.    [provider]  meclizine (ANTIVERT) 25 MG tablet Take 25 mg by mouth 3 (three) times daily as needed for dizziness.    [provider]  naproxen sodium (ALEVE) 220 MG tablet Take 220 mg by mouth daily as needed (for pain).     [provider]  omeprazole (PRILOSEC) 20 MG capsule Take 20 mg by mouth every morning.    [provider]  POTASSIUM PO Take 1 tablet by mouth daily.    [provider]    Family History Family History  Problem Relation Age of Onset  . Heart failure Mother   . Stroke Father   . Diabetes Brother   . Heart failure Brother     Social History Social History   Tobacco Use  . Smoking status: Former Smoker    Packs/day: 0.50    Years: 30.00    Pack years: 15.00    Types: Cigarettes      Last attempt to quit: 02/06/1979    Years since quitting: 39.5  . Smokeless tobacco: Never Used  Substance Use Topics  . Alcohol use: No    Alcohol/week: 0.0 standard drinks  . Drug use: No     Allergies   Patient has no known allergies.   Review of Systems Review of Systems 10 Systems reviewed and are negative for acute change except as noted in the HPI.-  Physical Exam Updated Vital Signs BP 134/62   Pulse 63   Temp 98.3 F (36.8 C) (Oral)   Resp 18   Ht 5\' 7"  (1.702 m)   Wt 72.6 kg   SpO2 94%   BMI 25.06 kg/m   Physical Exam  Constitutional: He is oriented to person, place, and time. He appears well-developed and well-nourished. No distress.  HENT:  Head: Normocephalic and atraumatic.  Mouth/Throat: Oropharynx is clear and moist.  Eyes: EOM are normal.  Neck: Neck supple.  Cardiovascular: Normal rate, regular rhythm, normal heart sounds and intact distal pulses.  Pulmonary/Chest: Effort normal and breath sounds normal.  Abdominal: Soft. Bowel sounds are normal. He exhibits no distension. There is no tenderness. There is no guarding.  Musculoskeletal: Normal range of motion. He exhibits no edema or tenderness.  Neurological: He is alert and oriented to person, place, and time. He exhibits normal muscle tone. Coordination normal.  Skin: Skin is warm and dry.  Psychiatric: He has a normal mood and affect.     ED Treatments / Results  Labs (all labs ordered are listed, but only abnormal results are displayed) Labs Reviewed  COMPREHENSIVE METABOLIC PANEL - Abnormal; Notable for the following components:      Result Value   Albumin 3.4 (*)    GFR calc non Af Amer 60 (*)    All other components within normal limits  CBC WITH DIFFERENTIAL/PLATELET - Abnormal; Notable for the following components:   Hemoglobin 12.9 (*)    All other components within normal limits  URINALYSIS, ROUTINE W REFLEX MICROSCOPIC - Abnormal; Notable for the following components:   Hgb  urine dipstick SMALL (*)    All other components within normal limits  I-STAT CG4 LACTIC ACID, ED - Abnormal; Notable for the following components:   Lactic Acid, Venous 2.24 (*)    All other components within normal limits  CULTURE, BLOOD (ROUTINE X 2)  CULTURE, BLOOD (ROUTINE X 2)  LIPASE, BLOOD  PROTIME-INR  I-STAT CG4 LACTIC ACID, ED    EKG None  Radiology No results found.  Procedures Procedures (including critical care time)  Medications Ordered in ED Medications  sodium chloride 0.9 % bolus 1,000 mL (0 mLs Intravenous Stopped 08/06/18 1518)  cefTRIAXone (ROCEPHIN) 1 g in  sodium chloride 0.9 % 100 mL IVPB (0 g Intravenous Stopped 08/06/18 1513)     Initial Impression / Assessment and Plan / ED Course  I have reviewed the triage vital signs and the nursing notes.  Pertinent labs & imaging results that were available during my care of the patient were reviewed by me and considered in my medical decision making (see chart for details).  Clinical Course as of Aug 07 1599  Thu Aug 06, 2018  1346 Consult infectious disease ordered   [MP]  1354 Reviewed with Dr. Linus Salmons of infectious disease.  Advises 1 dose of Rocephin in the emergency department and 3 days of ciprofloxacin orally.   [MP]    Clinical Course User Index [MP] Charlesetta Shanks, MD   Patient has clinically well appearance.  He is alert and nontoxic.  He is up and ambulatory with stable gait.  Renal function is stable.  No AKI or lab signs of significant dehydration.  Patient's stool panel tested positive for shiga toxin producing E. coli.  I have reviewed this with Dr. Linus Salmons.  Dr. Alton Revere suggests that at this point with 2 weeks of diarrhea patient could be treated with Rocephin 1 dose IV in the emergency department and 3 days of ciprofloxacin.  Patient is alert and has normal mental status.  He is counseled on the plan as reviewed with infectious disease.  He is counseled on return precautions.  Final Clinical  Impressions(s) / ED Diagnoses   Final diagnoses:  Shiga toxin-producing Escherichia coli infection    ED Discharge Orders         Ordered    ciprofloxacin (CIPRO) 500 MG tablet  Every 12 hours     08/06/18 1554           Charlesetta Shanks, MD 08/06/18 [phone]

## 2018-08-06 NOTE — ED Triage Notes (Addendum)
Pt endorses diarrhea x2 weeks. Pt denies N/V, or pain. Pt endorses some stiffness and weakness. Pt able to stand and ambulate. Pt is alert and oriented. Pt states he was seen at his PCP and had stool test done, was told it was "positive" but unsure of what positive for.

## 2018-08-06 NOTE — Discharge Instructions (Addendum)
1.  Call Dr. Henreitta Leber office to schedule a follow-up appointment soon as possible. 2.  Return to the emergency department if you feeling weak, develop fever, cannot take in adequate fluids, develop pain or other concerning symptoms.

## 2018-08-11 LAB — CULTURE, BLOOD (ROUTINE X 2)
CULTURE: NO GROWTH
Culture: NO GROWTH
Special Requests: ADEQUATE

## 2018-09-03 ENCOUNTER — Encounter: Payer: Self-pay | Admitting: Infectious Diseases

## 2018-09-03 ENCOUNTER — Ambulatory Visit (INDEPENDENT_AMBULATORY_CARE_PROVIDER_SITE_OTHER): Payer: Medicare Other | Admitting: Infectious Diseases

## 2018-09-03 VITALS — BP 139/68 | HR 75 | Temp 98.0°F | Ht 68.0 in | Wt 168.0 lb

## 2018-09-03 DIAGNOSIS — A498 Other bacterial infections of unspecified site: Secondary | ICD-10-CM

## 2018-09-03 HISTORY — DX: Other bacterial infections of unspecified site: A49.8

## 2018-09-03 NOTE — Patient Instructions (Addendum)
Wonderful to meet you!   I think you are recovering as expected from your initial GI illness.  Up to 30% of people who have diarrhea due to an infectious cause can experience irritable/abnormal bowel function for a period of time.   Would use some over the counter Imodium per box instructions if you need it.   Another alternative I like to suggest is a medication called Viberzi taken once daily.   Could consider a pro-biotic taken orally (Align is a good one) to help you gut "rebuild" itself.  Yogurt with active cultures (Proactive) may also help, but caution if you are diabetic as these yogurts can have a higher amount of sugar.   Happy to see you back should you need anything further.   Happy Holidays!    E. Coli Infection E. coli (Escherichia coli) are bacteria that can cause an infection in various parts of your body, including your intestines. E. coli bacteria normally live in the intestines of people and animals. Most types of E. coli do not cause infections, but some produce a poison (toxin) that can cause diarrhea. Depending on the toxin, this can cause mild or severe diarrhea. This condition can spread from person to person (is contagious). Toxin-producing E. coli can also spread from animals to humans. Most cases of E. coli infection come from cows (cattle). In some cases, this infection can cause a dangerous complication called hemolytic uremic syndrome (HUS). What are the causes? Causes of an E. coli intestinal infection include:  Eating raw or undercooked beef from infected cattle.  Touching an infected animal and then touching your mouth.  Eating fruits or vegetables that have come in contact with the feces of infected animals.  Drinking fluids that have been contaminated with E. coli from infected animals.  Coming into contact with a surface that has been contaminated by an infected person.  What increases the risk? This condition is more likely to develop in people  who:  Eat raw or undercooked beef.  Drink raw (unpasteurized) milk, cider, or juice.  Are in close contact with cattle, goats, or sheep.  What are the signs or symptoms? Symptoms of this condition usually start about 3-4 days after the bacteria are swallowed (ingested). Symptoms include:  Severe cramps and tenderness in the abdomen.  Diarrhea. This may be watery or bloody.  Nausea and vomiting.  Dehydration. Dehydration can cause fatigue, thirst, a dry mouth, and less frequent urination.  Low fever. This is not common.  How is this diagnosed? This condition is diagnosed with a medical history and physical exam. A stool sample may be taken and tested for E. coli or toxins of E. coli. How is this treated? Treatment for this condition includes rest and fluids (supportive care). If you have severe diarrhea, you may need to receive fluids through an IV tube. Symptoms of E. coli intestinal infection usually go away in 5-10 days. Some strains of E. coli may be treated with antibiotic or antidiarrheal medicines. However, this treatment is rarely used because these medicines may increase your risk for HUS. Follow these instructions at home: Eating and drinking  Drink enough fluids to keep your urine clear or pale yellow. You may need to drink small amounts of clear liquids frequently.  Ask your health care provider for specific rehydration instructions.  Do not drink milk, caffeine, or alcohol.  Eat small, frequent meals rather than large meals. General instructions  Take over-the-counter and prescription medicines only as told by your health  care provider.  Wash your hands thoroughly before and after you prepare food and after you go to the bathroom (use the toilet). Make sure people who live with you also wash their hands often. If soap and water are not available, use hand sanitizer.  Clean surfaces that you touch with a product that contains chlorine bleach.  Keep all follow-up  visits as told by your health care provider. This is important. How is this prevented?  Wash your hands often with soap and water. If soap and water are not available, use hand sanitizer. Always wash your hands: ? After you go to the bathroom. ? Before you touch food. ? After you prepare or cook beef. ? After you touch animals at farms, zoos, or fairs.  Do not eat raw or undercooked beef.  Do not drink unpasteurized milk or eat cheeses that were made with raw milk. Do not drink unpasteurized apple cider.  Wash cutting boards, counters, and utensils after you prepare raw meat.  Wash all fruits and vegetables before you eat or cook them. Contact a health care provider if:  Your symptoms do not get better with treatment.  You have new symptoms.  Your vomiting or diarrhea gets worse. Get help right away if:  You have increasing pain or tenderness in your abdomen.  You have ongoing (persistent) vomiting or diarrhea.  You have abdominal pain that stays in one area (localizes).  Your diarrhea has more blood in it.  You have a fever.  You cannot eat or drink without vomiting.  You have signs of dehydration or HUS, such as: ? Pale skin. ? Dark urine, very little urine, or no urine. ? Cracked lips. ? Not making tears while crying. ? Dry mouth. ? Sunken eyes. ? Sleepiness. ? Weakness. ? Dizziness. This information is not intended to replace advice given to you by your health care provider. Make sure you discuss any questions you have with your health care provider. Document Released: 06/05/2005 Document Revised: 02/15/2016 Document Reviewed: 03/13/2015 Elsevier Interactive Patient Education  [employer].

## 2018-09-03 NOTE — Progress Notes (Signed)
Patient: Oscar [name]  DOB: [birth year] MRN: [id number] PCP: Berkley Harvey, NP  Referring Provider: ER   Patient Active Problem List   Diagnosis Date Noted  . 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 Cervical myelopathy ([place]) 01/27/2018  . Status post placement of implantable loop recorder 11/08/2014  . Near syncope 05/16/2014  . Cancer of bladder ([place]) 02/10/2013     Subjective:  CC:  Intestinal infection and diarrhea; treated at ER 1 month ago.  HPI:  Oscar [name] is a 84 y.o. male with past medical h/o bladder cancer, GERD, irregular heart beat.   He tells me that back in early October he began having watery diarrhea with associated flatus, weakness, low appetite, joint aches/pains. This was ongoing for about a month until he sought care with his primary care provider. He denied every experiencing fevers, abdominal cramping, bloody/mucoid stools. Described the diarrhea to be watery, brown, profuse. He was sent to ER 08-06-18 after his PCP obtained GI pathogen panel and revealing shiga toxin producing EColi. He was alert, non-toxic in ER setting and labs stable. Dr. Linus Salmons was consulted over the phone, recommending Ceftriaxone x 1 and cipro 500 mg Q12h x 3 more days to treat.    He seemed to tolerate the Cipro well. Has had some ongoing loose stools but not daily now. He describes more firm BM in the morning and then sometimes will have less formed/loose stools in the afternoons/evenings but then he will go a few days w/o a BM. No fevers/abdominal pain or cramping. No blood. Reports some ongoing shoulder pains but improving with physical therapy (apparently he had severe cervicalgia with pinched nerve s/p decompression surgery recently). He has a normal appetite again. Estimates a 5# weightloss over the last 2 months but no further loss since treatment. Still feels that he struggles with fatigue.   Review of Systems  Constitutional: Positive for  malaise/fatigue. Negative for chills, diaphoresis and fever.  Respiratory: Negative for cough.   Cardiovascular: Negative for chest pain.  Gastrointestinal: Positive for diarrhea (improved, intermittent). Negative for abdominal pain, nausea and vomiting.  Genitourinary: Negative for dysuria.  Musculoskeletal: Positive for joint pain.  Skin: Negative for rash.  Neurological: Negative for headaches.    Past Medical History:  Diagnosis Date  . Bladder tumor   . Cervical stenosis of spine   . GERD (gastroesophageal reflux disease)   . History of appendectomy   . History of concussion    X2   NO RESIDUALS  . History of peptic ulcer 1970'S  . Hyperlipidemia   . Irregular heart beat   . LBBB (left bundle branch block)   . Status post placement of implantable loop recorder 11/08/2014    Outpatient Medications Prior to Visit  Medication Sig Dispense Refill  . Artificial Tear Ointment (DRY EYES OP) Place 1-2 drops into both eyes 2 (two) times daily as needed (for dry eyes).    Marland Kitchen aspirin EC 81 MG tablet Take 81 mg by mouth daily.    Marland Kitchen atorvastatin (LIPITOR) 20 MG tablet Take 1 tablet by mouth once.    . diphenoxylate-atropine (LOMOTIL) 2.5-0.025 MG tablet Take 1 tablet by mouth 2 (two) times daily as needed.  0  . Glucos-Chondroit-Hyaluron-MSM (GLUCOSAMINE CHONDROITIN JOINT PO) Take 2 tablets by mouth daily.     . Glucosamine-Chondroit-Vit C-Mn (GLUCOSAMINE 1500 COMPLEX PO) Take 1 capsule by mouth daily.    Marland Kitchen HYDROcodone-acetaminophen (NORCO/VICODIN) 5-325 MG tablet Take 1 tablet by mouth every  4 (four) hours as needed for moderate pain ((score 4 to 6)). 30 tablet 0  . levothyroxine (SYNTHROID, LEVOTHROID) 25 MCG tablet Take 1 tablet by mouth once.    Marland Kitchen MAGNESIUM PO Take 1 tablet by mouth daily.    . Magnesium-Potassium 250-100 MG TABS Take 1 tablet by mouth once.    . meclizine (ANTIVERT) 25 MG tablet Take 25 mg by mouth 3 (three) times daily as needed for dizziness.    . meloxicam (MOBIC)  7.5 MG tablet Take 1 tablet by mouth 2 (two) times daily as needed.    . Multiple Vitamins-Minerals (THERAGRAN-M PO) Take 1 tablet by mouth daily.    . naproxen sodium (ALEVE) 220 MG tablet Take 220 mg by mouth daily as needed (for pain).     Marland Kitchen omeprazole (PRILOSEC) 20 MG capsule Take 20 mg by mouth every morning.    Marland Kitchen POTASSIUM PO Take 1 tablet by mouth daily.     No facility-administered medications prior to visit.      No Known Allergies  Social History   Tobacco Use  . Smoking status: Former Smoker    Packs/day: 0.50    Years: 30.00    Pack years: 15.00    Types: Cigarettes    Last attempt to quit: 02/06/1979    Years since quitting: 39.6  . Smokeless tobacco: Never Used  Substance Use Topics  . Alcohol use: No    Alcohol/week: 0.0 standard drinks  . Drug use: No    Family History  Problem Relation Age of Onset  . Heart failure Mother   . Stroke Father   . Diabetes Brother   . Heart failure Brother     Objective:   Vitals:   09/03/18 1424  BP: 139/68  Pulse: 75  Temp: 98 F (36.7 C)  Weight: 168 lb (76.2 kg)  Height: 5\' 8"  (1.727 m)   Body mass index is 25.54 kg/m.  Physical Exam Constitutional:      Comments: Well appearing elderly man seated comfortably in the chair today.   HENT:     Mouth/Throat:     Mouth: No oral lesions.     Dentition: Normal dentition. No dental caries.  Eyes:     General: No scleral icterus. Cardiovascular:     Rate and Rhythm: Normal rate and regular rhythm.  Pulmonary:     Effort: Pulmonary effort is normal.  Abdominal:     General: Bowel sounds are normal. There is no distension.     Palpations: Abdomen is soft.     Tenderness: There is no abdominal tenderness.  Skin:    General: Skin is warm and dry.     Findings: No rash.  Neurological:     Mental Status: He is alert and oriented to person, place, and time.     Lab Results: Lab Results  Component Value Date   WBC 5.3 08/06/2018   HGB 12.9 (L) 08/06/2018     HCT 40.9 08/06/2018   MCV 93.6 08/06/2018   PLT 281 08/06/2018    Lab Results  Component Value Date   CREATININE 1.09 08/06/2018   BUN 14 08/06/2018   NA 137 08/06/2018   K 3.6 08/06/2018   CL 104 08/06/2018   CO2 24 08/06/2018    Lab Results  Component Value Date   ALT 15 08/06/2018   AST 18 08/06/2018   ALKPHOS 66 08/06/2018   BILITOT 0.4 08/06/2018     Assessment & Plan:   Problem List Items  Addressed This Visit      Unprioritized   Shiga toxin-producing Escherichia coli infection - Primary    Positive GI panel for shiga toxin-producing EColi infection although he denies any bloody stool, cramping, fevers. He was non-toxic and did not have severe dehydration with review of ER visit/diagnostics from 36m ago and seems to be improving following treatment. Discussed how after infectious intestinal infections can take time for stools to normalize. Suggested symptomatic care with OTC imodium if he needs this on occasion. I like to use Viberzi if there is postinfectious IBS-D symptoms however he does not have any discomfort in- particular. Could consider probiotic (align) to help with symptoms if he desires. I feel that his symptoms will continue to improve with time. No need to repeat stool testing today. Counseled on proper washing of produce, cooking food thoroughly, etc.  He is welcome to return if he or his care team need him re-evaluated.          Janene Madeira, MSN, NP-C [facility] [place] Pager: [phone] Office: [phone]  09/03/18  3:34 PM

## 2018-09-03 NOTE — Assessment & Plan Note (Addendum)
Positive GI panel for shiga toxin-producing EColi infection although he denies any bloody stool, cramping, fevers. He was non-toxic and did not have severe dehydration with review of ER visit/diagnostics from 59m ago and seems to be improving following treatment. Discussed how after infectious intestinal infections can take time for stools to normalize. Suggested symptomatic care with OTC imodium if he needs this on occasion. I like to use Viberzi if there is postinfectious IBS-D symptoms however he does not have any discomfort in- particular. Could consider probiotic (align) to help with symptoms if he desires. I feel that his symptoms will continue to improve with time. No need to repeat stool testing today. Counseled on proper washing of produce, cooking food thoroughly, etc.  He is welcome to return if he or his care team need him re-evaluated.

## 2018-12-04 ENCOUNTER — Emergency Department (HOSPITAL_BASED_OUTPATIENT_CLINIC_OR_DEPARTMENT_OTHER): Payer: Medicare Other

## 2018-12-04 ENCOUNTER — Emergency Department (HOSPITAL_BASED_OUTPATIENT_CLINIC_OR_DEPARTMENT_OTHER)
Admission: EM | Admit: 2018-12-04 | Discharge: 2018-12-04 | Disposition: A | Discharge status: Home / Self Care | Payer: Medicare Other | Attending: Emergency Medicine | Admitting: Emergency Medicine

## 2018-12-04 ENCOUNTER — Other Ambulatory Visit: Payer: Self-pay

## 2018-12-04 ENCOUNTER — Encounter (HOSPITAL_BASED_OUTPATIENT_CLINIC_OR_DEPARTMENT_OTHER): Payer: Self-pay

## 2018-12-04 DIAGNOSIS — Y9389 Activity, other specified: Secondary | ICD-10-CM | POA: Insufficient documentation

## 2018-12-04 DIAGNOSIS — Y929 Unspecified place or not applicable: Secondary | ICD-10-CM | POA: Insufficient documentation

## 2018-12-04 DIAGNOSIS — Z23 Encounter for immunization: Secondary | ICD-10-CM | POA: Insufficient documentation

## 2018-12-04 DIAGNOSIS — S61452A Open bite of left hand, initial encounter: Secondary | ICD-10-CM | POA: Insufficient documentation

## 2018-12-04 DIAGNOSIS — Z79899 Other long term (current) drug therapy: Secondary | ICD-10-CM | POA: Insufficient documentation

## 2018-12-04 DIAGNOSIS — T148XXA Other injury of unspecified body region, initial encounter: Secondary | ICD-10-CM

## 2018-12-04 DIAGNOSIS — Y999 Unspecified external cause status: Secondary | ICD-10-CM | POA: Insufficient documentation

## 2018-12-04 DIAGNOSIS — W5501XA Bitten by cat, initial encounter: Secondary | ICD-10-CM | POA: Insufficient documentation

## 2018-12-04 DIAGNOSIS — Z87891 Personal history of nicotine dependence: Secondary | ICD-10-CM | POA: Insufficient documentation

## 2018-12-04 DIAGNOSIS — Z7982 Long term (current) use of aspirin: Secondary | ICD-10-CM | POA: Insufficient documentation

## 2018-12-04 HISTORY — DX: Other bacterial infections of unspecified site: A49.8

## 2018-12-04 MED ORDER — AMOXICILLIN-POT CLAVULANATE 875-125 MG PO TABS
1.0000 | ORAL_TABLET | Freq: Once | ORAL | Status: AC
  Administered 2018-12-04: 1 via ORAL
  Filled 2018-12-04: qty 1

## 2018-12-04 MED ORDER — TETANUS-DIPHTH-ACELL PERTUSSIS 5-2.5-18.5 LF-MCG/0.5 IM SUSP
0.5000 mL | Freq: Once | INTRAMUSCULAR | Status: AC
  Administered 2018-12-04: 0.5 mL via INTRAMUSCULAR
  Filled 2018-12-04: qty 0.5

## 2018-12-04 MED ORDER — AMOXICILLIN-POT CLAVULANATE 875-125 MG PO TABS: 1.0000 | ORAL_TABLET | Freq: Two times a day (BID) | ORAL | 0 refills | Status: AC

## 2018-12-04 NOTE — ED Provider Notes (Signed)
[facility] Provider Note   CSN: [id number] Arrival date & time: 12/04/18  2058  History   Chief Complaint Chief Complaint  Patient presents with  . Animal Bite    HPI Oscar [name] is a 85 y.o. male with past medical history significant for left bundle branch who presents for evaluation of cat bite.  Patient states he was petting his cat approximately 9 AM this morning.  States the cat "freaked out" and bit his left hand.  Bite is located to the bursal aspect of his left hand to the distal second meta carpal.  Patient states he noticed some welling and some warmth this evening.  Patient became concerned this was possibly infected and presents emergency department for evaluation.  Denies fever, chills, nausea, vomiting, chest pain, shortness of breath, decreased range of motion, numbness or tingling in his extremities, warmth.  Has not take anything for his pain PTA.  Denies additional aggravating or alleviating factors.  He rates his pain a 3/10.  Pain does not radiate. Animal was patient cat and is UTD on vaccines.  History provided by patient.  No interpreter was used.     HPI  Past Medical History:  Diagnosis Date  . Bladder tumor   . Cervical stenosis of spine   . E. coli infection   . GERD (gastroesophageal reflux disease)   . History of appendectomy   . History of concussion    X2   NO RESIDUALS  . History of peptic ulcer 1970'S  . Hyperlipidemia   . Irregular heart beat   . LBBB (left bundle branch block)   . Status post placement of implantable loop recorder 11/08/2014    Patient Active Problem List   Diagnosis Date Noted  . 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 Cervical myelopathy ([place]) 01/27/2018  . Status post placement of implantable loop recorder 11/08/2014  . Near syncope 05/16/2014  . Cancer of bladder ([place]) 02/10/2013    Past Surgical History:  Procedure Laterality Date  . ANTERIOR CERVICAL  DECOMP/DISCECTOMY FUSION N/A 01/27/2018   Procedure: Anterior Cervical Discectomy Fusion - Cervical Four- Cervical Five;  Surgeon: Earnie Larsson, MD;  Location: [facility];  Service: Neurosurgery;  Laterality: N/A;  Anterior Cervical Discectomy Fusion - Cervical Four- Cervical Five  . APPENDECTOMY  1970  . CARDIOVASCULAR STRESS TEST  03/22/09  . CYSTOSCOPY WITH BIOPSY N/A 02/10/2013   Procedure: CYSTOSCOPY WITH COLD CUP BIOPSY/FULGERATION;  Surgeon: Bernestine Amass, MD;  Location: [facility];  Service: Urology;  Laterality: N/A;  ALSO FULGERATION   . INGUINAL HERNIA REPAIR Right 07-14-2002  . LOOP RECORDER IMPLANT N/A 05/24/2014   Procedure: LOOP RECORDER IMPLANT;  Surgeon: Sanda Klein, MD;  Location: [facility];  Service: Cardiovascular;  Laterality: N/A;  . NECK SURGERY    . TONSILLECTOMY    . TRANSURETHRAL RESECTION OF BLADDER TUMOR  03-10-2008        Home Medications    Prior to Admission medications   Medication Sig Start Date End Date Taking? Authorizing Provider  amoxicillin-clavulanate (AUGMENTIN) 875-125 MG tablet Take 1 tablet by mouth 2 (two) times daily for 5 days. 12/04/18 12/09/18  Vihaan Gloss A, PA-C  Artificial Tear Ointment (DRY EYES OP) Place 1-2 drops into both eyes 2 (two) times daily as needed (for dry eyes).    [provider]  aspirin EC 81 MG tablet Take 81 mg by mouth daily.    [provider]  atorvastatin (LIPITOR)  20 MG tablet Take 1 tablet by mouth once. 04/29/16   [provider]  diphenoxylate-atropine (LOMOTIL) 2.5-0.025 MG tablet Take 1 tablet by mouth 2 (two) times daily as needed. 08/05/18   [provider]  Glucos-Chondroit-Hyaluron-MSM (GLUCOSAMINE CHONDROITIN JOINT PO) Take 2 tablets by mouth daily.     [provider]  Glucosamine-Chondroit-Vit C-Mn (GLUCOSAMINE 1500 COMPLEX PO) Take 1 capsule by mouth daily.    [provider]  HYDROcodone-acetaminophen (NORCO/VICODIN) 5-325 MG tablet  Take 1 tablet by mouth every 4 (four) hours as needed for moderate pain ((score 4 to 6)). 01/27/18   Earnie Larsson, MD  levothyroxine (SYNTHROID, LEVOTHROID) 25 MCG tablet Take 1 tablet by mouth once. 08/06/18   [provider]  MAGNESIUM PO Take 1 tablet by mouth daily.    [provider]  Magnesium-Potassium 250-100 MG TABS Take 1 tablet by mouth once.    [provider]  meclizine (ANTIVERT) 25 MG tablet Take 25 mg by mouth 3 (three) times daily as needed for dizziness.    [provider]  meloxicam (MOBIC) 7.5 MG tablet Take 1 tablet by mouth 2 (two) times daily as needed. 08/26/18   [provider]  Multiple Vitamins-Minerals (THERAGRAN-M PO) Take 1 tablet by mouth daily.    [provider]  naproxen sodium (ALEVE) 220 MG tablet Take 220 mg by mouth daily as needed (for pain).     [provider]  omeprazole (PRILOSEC) 20 MG capsule Take 20 mg by mouth every morning.    [provider]  POTASSIUM PO Take 1 tablet by mouth daily.    [provider]    Family History Family History  Problem Relation Age of Onset  . Heart failure Mother   . Stroke Father   . Diabetes Brother   . Heart failure Brother     Social History Social History   Tobacco Use  . Smoking status: Former Smoker    Packs/day: 0.50    Years: 30.00    Pack years: 15.00    Types: Cigarettes    Last attempt to quit: 02/06/1979    Years since quitting: 39.8  . Smokeless tobacco: Never Used  Substance Use Topics  . Alcohol use: No    Alcohol/week: 0.0 standard drinks  . Drug use: No     Allergies   Patient has no known allergies.   Review of Systems Review of Systems  Constitutional: Negative.   HENT: Negative.   Respiratory: Negative.   Cardiovascular: Negative.   Gastrointestinal: Negative.   Genitourinary: Negative.   Musculoskeletal: Negative.   Skin: Positive for wound.  Neurological: Negative.   All other systems  reviewed and are negative.    Physical Exam Updated Vital Signs BP 123/75   Pulse 76   Temp 98.2 F (36.8 C) (Oral)   Resp 18   Ht 5\' 6"  (1.676 m)   Wt 74.4 kg   SpO2 98%   BMI 26.49 kg/m   Physical Exam Vitals signs and nursing note reviewed.  Constitutional:      General: He is not in acute distress.    Appearance: He is well-developed. He is not ill-appearing, toxic-appearing or diaphoretic.  HENT:     Head: Normocephalic and atraumatic.     Nose: Nose normal.     Mouth/Throat:     Mouth: Mucous membranes are moist.  Eyes:     Pupils: Pupils are equal, round, and reactive to light.  Neck:     Musculoskeletal:  Normal range of motion and neck supple.  Cardiovascular:     Rate and Rhythm: Normal rate and regular rhythm.  Pulmonary:     Effort: Pulmonary effort is normal. No respiratory distress.  Abdominal:     General: Bowel sounds are normal. There is no distension.     Palpations: Abdomen is soft.     Tenderness: There is no abdominal tenderness. There is no guarding or rebound.  Musculoskeletal: Normal range of motion.     Comments: Full range of motion bilateral upper extremities without difficulty.  No edema.  Does have some mild tenderness over second distal metacarpal to left upper extremity.  No obvious deformity.  Skin:    General: Skin is warm and dry.     Comments: 1 cm skin tear and bite to left upper extremity on dorsal surface by second metatarsal.  There is some mild surrounding erythema.  Mild soft tissue swelling.  No warmth.  No streaking.  No induration or fluctuance.  Brisk capillary refill.  Neurological:     Mental Status: He is alert.      ED Treatments / Results  Labs (all labs ordered are listed, but only abnormal results are displayed) Labs Reviewed - No data to display  EKG None  Radiology Dg Hand Complete Left  Result Date: 12/04/2018 CLINICAL DATA:  Cat bite EXAM: LEFT HAND - COMPLETE 3+ VIEW COMPARISON:  None. FINDINGS:  Frontal, oblique, and lateral views were obtained. There is a radiopaque foreign body measuring 5 x 2 mm located lateral to the midportion of the first metacarpal. No other radiopaque foreign body identified. No soft tissue air. There is no evident fracture or dislocation. There is moderate osteoarthritic change in the first carpal-metacarpal and scaphotrapezial joints. There is mild narrowing of all MCP, PIP, and DIP joints. No erosive changes. Bones are osteoporotic. IMPRESSION: 1. 5 x 2 mm radiopaque foreign body located lateral to the midportion of the first metacarpal. 2. 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 3.  No evident fracture or dislocation. 4.  Bones osteoporotic. Electronically Signed   By: Lowella Grip III M.D.   On: 12/04/2018 21:49    Procedures Procedures (including critical care time)  Medications Ordered in ED Medications  Tdap (BOOSTRIX) injection 0.5 mL (0.5 mLs Intramuscular Given 12/04/18 2143)  amoxicillin-clavulanate (AUGMENTIN) 875-125 MG per tablet 1 tablet (1 tablet Oral Given 12/04/18 2143)     Initial Impression / Assessment and Plan / ED Course  I have reviewed the triage vital signs and the nursing notes.  Pertinent labs & imaging results that were available during my care of the patient were reviewed by me and considered in my medical decision making (see chart for details).  85 year old male appears otherwise well presents for evaluation after cat bite.  Afebrile, nonseptic, non-ill-appearing.  Patient was bit by his cat.  Up-to-date on immunizations including rabies. Patient will have cat observed for sx of rabies. Will defer on rabies series at this time. Patient with 1 cm skin tear to dorsal surface of left hand.  Mild surrounding erythema without warmth.  Some mild tenderness and swelling.  No Streaking.  No fluctuance or induration.  Normal musculoskeletal exam.  Neurovascularly intact.  X-ray consistent with  foreign body.  Patient does not have any evidence of foreign body on exam.  Wound thoroughly explored. Wound superficial nature.  Patient does not have disruption in skin integrity to site where foreign body present on xray.  Patient states  he did attend injury as a child to this area.  Will place patient on Augmentin.  Discussed return precautions that would require patient to return to the emergency department.  Patient hemodynamically stable and appropriate for DC home at this time. Patient voiced understanding of return precautions and is agreeable for follow-up.     Final Clinical Impressions(s) / ED Diagnoses   Final diagnoses:  Animal bite    ED Discharge Orders         Ordered    amoxicillin-clavulanate (AUGMENTIN) 875-125 MG tablet  2 times daily     12/04/18 2242           Donelle Baba A, PA-C 12/04/18 2321    Malvin Johns, MD 12/04/18 2323

## 2018-12-04 NOTE — ED Triage Notes (Signed)
Pt states his cat bit left hand ~9am-redness, swelling started ~6pm-NAD-steady gait

## 2018-12-04 NOTE — ED Notes (Signed)
ED Provider at bedside. 

## 2018-12-04 NOTE — ED Notes (Signed)
Patient transported to X-ray 

## 2018-12-04 NOTE — Discharge Instructions (Signed)
Evaluated today after cat bite.  We have given you Augmentin.  Please take as prescribed.  If you notice increased redness, swelling or streaking of redness up your arm please seek reevaluation.  Follow-up with PCP in 3 days for wound check.  Return to the ED for any new or worsening symptoms.

## 2019-06-29 ENCOUNTER — Other Ambulatory Visit: Payer: Self-pay

## 2019-06-29 ENCOUNTER — Ambulatory Visit (INDEPENDENT_AMBULATORY_CARE_PROVIDER_SITE_OTHER): Payer: Medicare Other

## 2019-06-29 ENCOUNTER — Ambulatory Visit (HOSPITAL_BASED_OUTPATIENT_CLINIC_OR_DEPARTMENT_OTHER)
Admission: RE | Admit: 2019-06-29 | Discharge: 2019-06-29 | Disposition: A | Discharge status: Home / Self Care | Payer: Medicare Other | Source: Ambulatory Visit | Attending: Cardiology | Admitting: Cardiology

## 2019-06-29 ENCOUNTER — Ambulatory Visit (INDEPENDENT_AMBULATORY_CARE_PROVIDER_SITE_OTHER): Payer: Medicare Other | Admitting: Cardiology

## 2019-06-29 ENCOUNTER — Encounter: Payer: Self-pay | Admitting: Cardiology

## 2019-06-29 VITALS — BP 146/74 | HR 76 | Resp 10 | Ht 67.0 in | Wt 164.4 lb

## 2019-06-29 DIAGNOSIS — R011 Cardiac murmur, unspecified: Secondary | ICD-10-CM | POA: Diagnosis not present

## 2019-06-29 DIAGNOSIS — I447 Left bundle-branch block, unspecified: Secondary | ICD-10-CM

## 2019-06-29 DIAGNOSIS — Z95818 Presence of other cardiac implants and grafts: Secondary | ICD-10-CM

## 2019-06-29 DIAGNOSIS — R002 Palpitations: Secondary | ICD-10-CM

## 2019-06-29 DIAGNOSIS — R42 Dizziness and giddiness: Secondary | ICD-10-CM | POA: Diagnosis not present

## 2019-06-29 DIAGNOSIS — R0602 Shortness of breath: Secondary | ICD-10-CM | POA: Diagnosis not present

## 2019-06-29 DIAGNOSIS — R55 Syncope and collapse: Secondary | ICD-10-CM | POA: Diagnosis not present

## 2019-06-29 DIAGNOSIS — R531 Weakness: Secondary | ICD-10-CM

## 2019-06-29 LAB — ECHOCARDIOGRAM COMPLETE
Height: 67 in
Weight: 2630.4 oz

## 2019-06-29 NOTE — Addendum Note (Signed)
Addended by: Berniece Salines on: 06/29/2019 03:57 PM   Modules accepted: Level of Service

## 2019-06-29 NOTE — Patient Instructions (Addendum)
Medication Instructions:  Your physician recommends that you continue on your current medications as directed. Please refer to the Current Medication list given to you today.  If you need a refill on your cardiac medications before your next appointment, please call your pharmacy.   Lab work: Your physician recommends that you return for lab work in: *Today BMP,Magnesium,CBC  If you have labs (blood work) drawn today and your tests are completely normal, you will receive your results only by: Marland Kitchen MyChart Message (if you have MyChart) OR . A paper copy in the mail If you have any lab test that is abnormal or we need to change your treatment, we will call you to review the results.  Testing/Procedures: Your physician has requested that you have an echocardiogram. Echocardiography is a painless test that uses sound waves to create images of your heart. It provides your doctor with information about the size and shape of your heart and how well your heart's chambers and valves are working. This procedure takes approximately one hour. There are no restrictions for this procedure.  A zio monitor was placed today. It will remain on for 3 days. You will then return monitor and event diary in provided box. It takes 1-2 weeks for report to be downloaded and returned to [place]. We will call you with the results. If monitor falls off or has orange flashing light, please call Zio for further instructions.    Follow-Up: At [facility], you and your health needs are our priority.  As part of our continuing mission to provide you with exceptional heart care, we have created designated Provider Care Teams.  These Care Teams include your primary Cardiologist (physician) and Advanced Practice Providers (APPs -  Physician Assistants and Nurse Practitioners) who all work together to provide you with the care you need, when you need it. You will need a follow up appointment in 1 months with Dr Harriet Masson Any Other Special  Instructions Will Be Listed Below (If Applicable).

## 2019-06-29 NOTE — Progress Notes (Signed)
Cardiology Office Note:    Date:  06/29/2019   ID:  Oscar [name], DOB 05-07-1931, MRN [id number]  PCP:  Berkley Harvey, NP  Cardiologist:  Sanda Klein, MD  Electrophysiologist:  None   Referring MD: Berkley Harvey, NP   Chief Complaint  Patient presents with  . Dizziness  . Fatigue  . "Jumpy"    History of Present Illness:    Oscar [name] is a 85 y.o. male with a hx hyperlipidemia, status post ILR placement 2016 for near syncope, hyperlipidemia chronic LBBB presents today to be evaluated for generalized fatigue, shortness of breath and palpitations. The patient reports that over the last several months he has been experiencing worsening shortness of breath along with generalized fatigue.  He states that what will take him about a minute to walk to his mailbox is now taking him close to 5 minutes and once he is back from the mailbox he significantly short of breath and has to sit down for a few minutes before he can catch his breath.  This is bothersome because he used to be a very active man.  Lately he states that he has been only short of breath but have had a great deal of fatigue and now associated dizziness and lightheadedness.  He states that his day start with him feeling fine but as the day goes by he will get this intermittent palpitation which results in significant lightheaded and dizziness followed by all day fatigue.   He denies any chest pain.  Past Medical History:  Diagnosis Date  . Bladder tumor   . Cervical stenosis of spine   . E. coli infection   . GERD (gastroesophageal reflux disease)   . History of appendectomy   . History of concussion    X2   NO RESIDUALS  . History of peptic ulcer 1970'S  . Hyperlipidemia   . Irregular heart beat   . LBBB (left bundle branch block)   . Status post placement of implantable loop recorder 11/08/2014    Past Surgical History:  Procedure Laterality Date  . ANTERIOR CERVICAL DECOMP/DISCECTOMY FUSION N/A 01/27/2018   Procedure: Anterior Cervical Discectomy Fusion - Cervical Four- Cervical Five;  Surgeon: Earnie Larsson, MD;  Location: [facility];  Service: Neurosurgery;  Laterality: N/A;  Anterior Cervical Discectomy Fusion - Cervical Four- Cervical Five  . APPENDECTOMY  1970  . CARDIOVASCULAR STRESS TEST  03/22/09  . CYSTOSCOPY WITH BIOPSY N/A 02/10/2013   Procedure: CYSTOSCOPY WITH COLD CUP BIOPSY/FULGERATION;  Surgeon: Bernestine Amass, MD;  Location: [facility];  Service: Urology;  Laterality: N/A;  ALSO FULGERATION   . INGUINAL HERNIA REPAIR Right 07-14-2002  . LOOP RECORDER IMPLANT N/A 05/24/2014   Procedure: LOOP RECORDER IMPLANT;  Surgeon: Sanda Klein, MD;  Location: [facility];  Service: Cardiovascular;  Laterality: N/A;  . NECK SURGERY    . TONSILLECTOMY    . TRANSURETHRAL RESECTION OF BLADDER TUMOR  03-10-2008    Current Medications: Current Meds  Medication Sig  . aspirin EC 81 MG tablet Take 81 mg by mouth daily.  . Glucosamine-Chondroit-Vit C-Mn (GLUCOSAMINE 1500 COMPLEX PO) Take 1 capsule by mouth daily.  Marland Kitchen levothyroxine (SYNTHROID, LEVOTHROID) 25 MCG tablet Take 1 tablet by mouth every Monday, Wednesday, and Friday.   Marland Kitchen MAGNESIUM PO Take 1 tablet by mouth daily.  . Olopatadine HCl 0.2 % SOLN Apply 1 drop to eye as needed.  Marland Kitchen omeprazole (PRILOSEC) 20 MG capsule Take 20 mg by mouth  every morning.  Marland Kitchen POTASSIUM PO Take 1 tablet by mouth daily.     Allergies:   Patient has no known allergies.   Social History   Socioeconomic History  . Marital status: Married    Spouse name: Not on file  . Number of children: Not on file  . Years of education: Not on file  . Highest education level: Not on file  Occupational History  . Not on file  Social Needs  . Financial resource strain: Not on file  . Food insecurity    Worry: Not on file    Inability: Not on file  . Transportation needs    Medical: Not on file    Non-medical: Not on file  Tobacco Use  . Smoking status: Former  Smoker    Packs/day: 0.50    Years: 30.00    Pack years: 15.00    Types: Cigarettes    Quit date: 02/06/1979    Years since quitting: 40.4  . Smokeless tobacco: Never Used  Substance and Sexual Activity  . Alcohol use: Yes    Alcohol/week: 0.0 standard drinks    Comment: 1 glass of beer once or twice a month  . Drug use: No  . Sexual activity: Not on file  Lifestyle  . Physical activity    Days per week: Not on file    Minutes per session: Not on file  . Stress: Not on file  Relationships  . Social Herbalist on phone: Not on file    Gets together: Not on file    Attends religious service: Not on file    Active member of club or organization: Not on file    Attends meetings of clubs or organizations: Not on file    Relationship status: Not on file  Other Topics Concern  . Not on file  Social History Narrative  . Not on file     Family History: The patient's family history includes Diabetes in his brother; Heart failure in his brother and mother; Stroke in his father.  ROS:   Review of Systems  Constitution: Negative for decreased appetite, fever and weight gain.  HENT: Negative for congestion, ear discharge, hoarse voice and sore throat.   Eyes: Negative for discharge, redness, vision loss in right eye and visual halos.  Cardiovascular: Negative for chest pain, dyspnea on exertion, leg swelling, orthopnea and palpitations.  Respiratory: Negative for cough, hemoptysis, shortness of breath and snoring.   Endocrine: Negative for heat intolerance and polyphagia.  Hematologic/Lymphatic: Negative for bleeding problem. Does not bruise/bleed easily.  Skin: Negative for flushing, nail changes, rash and suspicious lesions.  Musculoskeletal: Negative for arthritis, joint pain, muscle cramps, myalgias, neck pain and stiffness.  Gastrointestinal: Negative for abdominal pain, bowel incontinence, diarrhea and excessive appetite.  Genitourinary: Negative for decreased libido,  genital sores and incomplete emptying.  Neurological: Negative for brief paralysis, focal weakness, headaches and loss of balance.  Psychiatric/Behavioral: Negative for altered mental status, depression and suicidal ideas.  Allergic/Immunologic: Negative for HIV exposure and persistent infections.    EKGs/Labs/Other Studies Reviewed:    The following studies were reviewed today:   EKG:  The ekg ordered today demonstrates   Pharmacologic nuclear stress test: 01/2018  The left ventricular ejection fraction is mildly decreased (45-54%).  Nuclear stress EF: 52%.  There was no ST segment deviation noted during stress.  There is a medium defect of moderate severity present in the basal inferoseptal, basal inferior, mid inferoseptal, mid inferior  and apical inferior location. The defect is non-reversible. In the setting of normal LVF, this is consistent with diaphragmatic attenuation artifact. Septal perfusion defect likely related to LBBB. No ischemia noted.  This is a low risk study.  Baseline EKG showed LBBB  TTE 2015   Left ventricle: The cavity size was normal. Wall thickness was  normal. Systolic function was normal. The estimated ejection  fraction was in the range of 55% to 60%. Wall motion was normal;  there were no regional wall motion abnormalities. Doppler  parameters are consistent with abnormal left ventricular  relaxation (grade 1 diastolic dysfunction).  - Aortic valve: There was no stenosis.  - Mitral valve: There was trivial regurgitation.  - Right ventricle: The cavity size was normal. Systolic function  was normal.  - Tricuspid valve: Peak RV-RA gradient (S): 16 mm Hg.  - Pulmonary arteries: PA peak pressure: 19 mm Hg (S).  - Inferior vena cava: The vessel was normal in size. The  respirophasic diameter changes were in the normal range (>= 50%),  consistent with normal central venous pressure.    Recent Labs: 08/06/2018: ALT 15; BUN 14;  Creatinine, Ser 1.09; Hemoglobin 12.9; Platelets 281; Potassium 3.6; Sodium 137  Recent Lipid Panel No results found for: CHOL, TRIG, HDL, CHOLHDL, VLDL, LDLCALC, LDLDIRECT  Physical Exam:    VS:  BP (!) 146/74   Pulse 76   Resp 10   Ht 5\' 7"  (1.702 m)   Wt 164 lb 6.4 oz (74.6 kg)   BMI 25.75 kg/m     Wt Readings from Last 3 Encounters:  06/29/19 164 lb 6.4 oz (74.6 kg)  12/04/18 164 lb 1.6 oz (74.4 kg)  09/03/18 168 lb (76.2 kg)     GEN: Well nourished, well developed in no acute distress HEENT: Normal NECK: No JVD; No carotid bruits LYMPHATICS: No lymphadenopathy CARDIAC: S1S2 noted,RRR, 2/6 soft holosystolic murmurs, rubs, gallops RESPIRATORY:  Clear to auscultation without rales, wheezing or rhonchi  ABDOMEN: Soft, non-tender, non-distended, +bowel sounds, no guarding. EXTREMITIES: No edema, No cyanosis, no clubbing MUSCULOSKELETAL:  No edema; No deformity  SKIN: Warm and dry NEUROLOGIC:  Alert and oriented x 3, non-focal PSYCHIATRIC:  Normal affect, good insight  ASSESSMENT:    1. Near syncope   2. Shortness of breath   3. Dizziness   4. Palpitations   5. Murmur, cardiac   6. Status post placement of implantable loop recorder   7. Left bundle branch block   8. Generalized weakness    PLAN:    1.  At this time I would like the patient to undergo thoracic echocardiogram to assess his concerning soft holosystolic to rule out mitral regurgitation as well as any other valvular heart disease and also his LV/RV function.  He certainly does have risk factors and it would be appropriate to pursue ischemic evaluation.  As I am concern that he may have severe valvular disease, I will make appropriate recommendations for ischemic evaluation once he has completed his echocardiogram.   2. In terms of his lightheadedness and dizziness, a 3-day ZIO patch have been placed on the patient, to assess for any heart rhythm changes and heart rate excursions.  He does have an implantable  loop recorder which was placed in 2016, I believe may not be functional at this time.  I have asked the patient to see the EP for further discussion if this needs to be removed.  3.  Blood work will be performed today.  Includes CBC,  BMP, and mag.  4. His blood pressure is slightly elevated in the office today but given his dizziness, I will hold off on starting any antihypertensive.  I have asked the patient take his blood pressure daily as well as decreasing salt intake.  The patient is in agreement with the above plan. The patient left the office in stable condition.  The patient will follow up in 1 month.   Medication Adjustments/Labs and Tests Ordered: Current medicines are reviewed at length with the patient today.  Concerns regarding medicines are outlined above.  Orders Placed This Encounter  Procedures  . Basic Metabolic Panel (BMET)  . Magnesium  . CBC  . Ambulatory referral to Cardiac Electrophysiology  . LONG TERM MONITOR (3-14 DAYS)  . ECHOCARDIOGRAM COMPLETE   No orders of the defined types were placed in this encounter.   Patient Instructions  Medication Instructions:  Your physician recommends that you continue on your current medications as directed. Please refer to the Current Medication list given to you today.  If you need a refill on your cardiac medications before your next appointment, please call your pharmacy.   Lab work: Your physician recommends that you return for lab work in: *Today BMP,Magnesium,CBC  If you have labs (blood work) drawn today and your tests are completely normal, you will receive your results only by: Marland Kitchen MyChart Message (if you have MyChart) OR . A paper copy in the mail If you have any lab test that is abnormal or we need to change your treatment, we will call you to review the results.  Testing/Procedures: Your physician has requested that you have an echocardiogram. Echocardiography is a painless test that uses sound waves to  create images of your heart. It provides your doctor with information about the size and shape of your heart and how well your heart's chambers and valves are working. This procedure takes approximately one hour. There are no restrictions for this procedure.  A zio monitor was placed today. It will remain on for 3 days. You will then return monitor and event diary in provided box. It takes 1-2 weeks for report to be downloaded and returned to [place]. We will call you with the results. If monitor falls off or has orange flashing light, please call Zio for further instructions.    Follow-Up: At [facility], you and your health needs are our priority.  As part of our continuing mission to provide you with exceptional heart care, we have created designated Provider Care Teams.  These Care Teams include your primary Cardiologist (physician) and Advanced Practice Providers (APPs -  Physician Assistants and Nurse Practitioners) who all work together to provide you with the care you need, when you need it. You will need a follow up appointment in 1 months with Dr Harriet Masson Any Other Special Instructions Will Be Listed Below (If Applicable).       Adopting a Healthy Lifestyle.  Know what a healthy weight is for you (roughly BMI <25) and aim to maintain this   Aim for 7+ servings of fruits and vegetables daily   65-80+ fluid ounces of water or unsweet tea for healthy kidneys   Limit to max 1 drink of alcohol per day; avoid smoking/tobacco   Limit animal fats in diet for cholesterol and heart health - choose grass fed whenever available   Avoid highly processed foods, and foods high in saturated/trans fats   Aim for low stress - take time to unwind and care for your  mental health   Aim for 150 min of moderate intensity exercise weekly for heart health, and weights twice weekly for bone health   Aim for 7-9 hours of sleep daily   When it comes to diets, agreement about the perfect plan isnt easy to  find, even among the experts. Experts at the [employer] developed an idea known as the Healthy Eating Plate. Just imagine a plate divided into logical, healthy portions.   The emphasis is on diet quality:   Load up on vegetables and fruits - one-half of your plate: Aim for color and variety, and remember that potatoes dont count.   Go for whole grains - one-quarter of your plate: Whole wheat, barley, wheat berries, quinoa, oats, brown rice, and foods made with them. If you want pasta, go with whole wheat pasta.   Protein power - one-quarter of your plate: Fish, chicken, beans, and nuts are all healthy, versatile protein sources. Limit red meat.   The diet, however, does go beyond the plate, offering a few other suggestions.   Use healthy plant oils, such as olive, canola, soy, corn, sunflower and peanut. Check the labels, and avoid partially hydrogenated oil, which have unhealthy trans fats.   If youre thirsty, drink water. Coffee and tea are good in moderation, but skip sugary drinks and limit milk and dairy products to one or two daily servings.   The type of carbohydrate in the diet is more important than the amount. Some sources of carbohydrates, such as vegetables, fruits, whole grains, and beans-are healthier than others.   Finally, stay active  Signed, Berniece Salines, DO  06/29/2019 2:42 PM    [facility]

## 2019-06-29 NOTE — Progress Notes (Signed)
  Echocardiogram 2D Echocardiogram has been performed.  Cardell Peach 06/29/2019, 2:56 PM

## 2019-06-30 LAB — BASIC METABOLIC PANEL
BUN/Creatinine Ratio: 18 (ref 10–24)
BUN: 18 mg/dL (ref 8–27)
CO2: 25 mmol/L (ref 20–29)
Calcium: 9.4 mg/dL (ref 8.6–10.2)
Chloride: 105 mmol/L (ref 96–106)
Creatinine, Ser: 1.02 mg/dL (ref 0.76–1.27)
GFR calc Af Amer: 77 mL/min/{1.73_m2} (ref >59)
GFR calc non Af Amer: 66 mL/min/{1.73_m2} (ref >59)
Glucose: 71 mg/dL (ref 65–99)
Potassium: 4.5 mmol/L (ref 3.5–5.2)
Sodium: 142 mmol/L (ref 134–144)

## 2019-06-30 LAB — CBC
Hematocrit: 40.5 % (ref 37.5–51.0)
Hemoglobin: 13.7 g/dL (ref 13.0–17.7)
MCH: 31.1 pg (ref 26.6–33.0)
MCHC: 33.8 g/dL (ref 31.5–35.7)
MCV: 92 fL (ref 79–97)
Platelets: 226 10*3/uL (ref 150–450)
RBC: 4.4 x10E6/uL (ref 4.14–5.80)
RDW: 12.7 % (ref 11.6–15.4)
WBC: 5.6 10*3/uL (ref 3.4–10.8)

## 2019-06-30 LAB — MAGNESIUM: Magnesium: 2.1 mg/dL (ref 1.6–2.3)

## 2019-07-01 ENCOUNTER — Telehealth: Payer: Self-pay

## 2019-07-01 DIAGNOSIS — R079 Chest pain, unspecified: Secondary | ICD-10-CM

## 2019-07-01 MED ORDER — FUROSEMIDE 40 MG PO TABS: ORAL_TABLET | ORAL | 3 refills | Status: DC

## 2019-07-01 NOTE — Addendum Note (Signed)
Addended by: Jerl Santos R on: 07/01/2019 09:25 AM   Modules accepted: Orders

## 2019-07-01 NOTE — Telephone Encounter (Addendum)
Attempted to reach patient by phone this morning and presently to inform of lexiscan ordered and date/time/location of test and to inform of medication change with lasix dose. No answer, no voicemail. Letter with instructions will be mailed to patient and will follow-up by phone tomorrow.  Lasix updated to 40 mg three times weekly on Tues/Th/Sat, sent to pharmacy.

## 2019-07-02 ENCOUNTER — Telehealth: Payer: Self-pay | Admitting: Cardiology

## 2019-07-02 ENCOUNTER — Telehealth (HOSPITAL_COMMUNITY): Payer: Self-pay

## 2019-07-02 NOTE — Telephone Encounter (Signed)
Called patient to schedule appt with Dr Curt Bears -- patient is not interested in making the appt at this time, he states he does not feel it is necessary.  He wants to wait and "see how things go"

## 2019-07-02 NOTE — Telephone Encounter (Signed)
Ok thank you 

## 2019-07-02 NOTE — Telephone Encounter (Signed)
Encounter complete. 

## 2019-07-03 DIAGNOSIS — R55 Syncope and collapse: Secondary | ICD-10-CM | POA: Diagnosis not present

## 2019-07-05 ENCOUNTER — Telehealth: Payer: Self-pay | Admitting: *Deleted

## 2019-07-05 NOTE — Telephone Encounter (Signed)
Telephone call to patient. States has been informed of the stress test appointment. Lasix calle din to [facility] as requested

## 2019-07-05 NOTE — Telephone Encounter (Signed)
-----   Message from Berniece Salines, DO sent at 07/01/2019  8:17 AM EDT ----- I called patient and discuss with him about the echo findings. In addition I explained to him that I like him to get a pharmacologic nuclear stress test.  He is aware that we will be sending him lasix 40mg  dose, Tuesday, Thursdays, Saturdays. Please have him schedule for the testing.  And please send Lasix 40 mg Tuesdays, Thursdays, Saturdays to his pharmacy (he prefers to [facility]).

## 2019-07-06 ENCOUNTER — Ambulatory Visit (HOSPITAL_COMMUNITY)
Admission: RE | Admit: 2019-07-06 | Discharge: 2019-07-06 | Disposition: A | Discharge status: Home / Self Care | Payer: Medicare Other | Source: Ambulatory Visit | Attending: Cardiovascular Disease | Admitting: Cardiovascular Disease

## 2019-07-06 ENCOUNTER — Other Ambulatory Visit: Payer: Self-pay

## 2019-07-06 DIAGNOSIS — R079 Chest pain, unspecified: Secondary | ICD-10-CM

## 2019-07-06 LAB — MYOCARDIAL PERFUSION IMAGING
LV dias vol: 101 mL (ref 62–150)
LV sys vol: 45 mL
Peak HR: 70 {beats}/min
Rest HR: 59 {beats}/min
SDS: 8
SRS: 3
SSS: 11
TID: 1.14

## 2019-07-06 MED ORDER — TECHNETIUM TC 99M TETROFOSMIN IV KIT
29.0000 | PACK | Freq: Once | INTRAVENOUS | Status: AC | PRN
  Administered 2019-07-06: 29 via INTRAVENOUS
  Filled 2019-07-06: qty 29

## 2019-07-06 MED ORDER — REGADENOSON 0.4 MG/5ML IV SOLN
0.4000 mg | Freq: Once | INTRAVENOUS | Status: AC
  Administered 2019-07-06: 0.4 mg via INTRAVENOUS

## 2019-07-06 MED ORDER — TECHNETIUM TC 99M TETROFOSMIN IV KIT
10.2000 | PACK | Freq: Once | INTRAVENOUS | Status: AC | PRN
  Administered 2019-07-06: 10.2 via INTRAVENOUS
  Filled 2019-07-06: qty 11

## 2019-07-08 ENCOUNTER — Other Ambulatory Visit: Payer: Self-pay

## 2019-07-08 ENCOUNTER — Ambulatory Visit (INDEPENDENT_AMBULATORY_CARE_PROVIDER_SITE_OTHER): Payer: Medicare Other | Admitting: Cardiology

## 2019-07-08 ENCOUNTER — Ambulatory Visit (HOSPITAL_BASED_OUTPATIENT_CLINIC_OR_DEPARTMENT_OTHER)
Admission: RE | Admit: 2019-07-08 | Discharge: 2019-07-08 | Disposition: A | Discharge status: Home / Self Care | Payer: Medicare Other | Source: Ambulatory Visit | Attending: Cardiology | Admitting: Cardiology

## 2019-07-08 ENCOUNTER — Encounter: Payer: Self-pay | Admitting: Cardiology

## 2019-07-08 VITALS — BP 124/74 | HR 69 | Temp 97.5°F | Ht 67.0 in | Wt 164.0 lb

## 2019-07-08 DIAGNOSIS — Z01818 Encounter for other preprocedural examination: Secondary | ICD-10-CM | POA: Insufficient documentation

## 2019-07-08 DIAGNOSIS — R600 Localized edema: Secondary | ICD-10-CM

## 2019-07-08 DIAGNOSIS — R9439 Abnormal result of other cardiovascular function study: Secondary | ICD-10-CM

## 2019-07-08 DIAGNOSIS — I77810 Thoracic aortic ectasia: Secondary | ICD-10-CM

## 2019-07-08 NOTE — Patient Instructions (Signed)
Medication Instructions:  Your physician recommends that you continue on your current medications as directed. Please refer to the Current Medication list given to you today.  *If you need a refill on your cardiac medications before your next appointment, please call your pharmacy*  Lab Work: None  If you have labs (blood work) drawn today and your tests are completely normal, you will receive your results only by: Marland Kitchen MyChart Message (if you have MyChart) OR . A paper copy in the mail If you have any lab test that is abnormal or we need to change your treatment, we will call you to review the results.  Testing/Procedures:  Your physician has requested that you have a lower or upper extremity venous duplex. This test is an ultrasound of the veins in the legs or arms. It looks at venous blood flow that carries blood from the heart to the legs or arms. Allow one hour for a Lower Venous exam. Allow thirty minutes for an Upper Venous exam. There are no restrictions or special instructions.  A chest x-ray takes a picture of the organs and structures inside the chest, including the heart, lungs, and blood vessels. This test can show several things, including, whether the heart is enlarges; whether fluid is building up in the lungs; and whether pacemaker / defibrillator leads are still in place.  You will go to the [facility] for a drive-thru pre-procedure COVID test on Saturday, 07/10/2019 at 11:25 am. Please arrive at [place], [place] 15 minutes early. DO NOT go to the tent or the tent line for testing. Go to the building overhang.      [place], [place] [address] Dept: [phone] Loc: [place]  07/08/2019  You are scheduled for a Cardiac Catheterization on Tuesday, October 20 with Dr. Shelva Majestic.  1. Please arrive at the  [facility] (Main Entrance A) at [facility]: [address] [place], [place] [address] at 7:00 AM (This time is two hours before your procedure to ensure your preparation). Free valet parking service is available.   Special note: Every effort is made to have your procedure done on time. Please understand that emergencies sometimes delay scheduled procedures.  2. Diet: Do not eat solid foods after midnight.  The patient may have clear liquids until 5am upon the day of the procedure.  3. Labs: None needed.   4. Medication instructions in preparation for your procedure:   Contrast Allergy: No  DO NOT TAKE furosemide (lasix) the morning of your heart catheterization until after the procedure is completed.   On the morning of your procedure, take your Aspirin and any morning medicines NOT listed above.  You may use sips of water.  5. Plan for one night stay--bring personal belongings. 6. Bring a current list of your medications and current insurance cards. 7. You MUST have a responsible person to drive you home. 8. Someone MUST be with you the first 24 hours after you arrive home or your discharge will be delayed. 9. Please wear clothes that are easy to get on and off and wear slip-on shoes.  Thank you for allowing [place] to care for you!   -- [facility]   Follow-Up: At [facility], you and your health needs are our priority.  As part of our continuing mission to provide you with exceptional  heart care, we have created designated Provider Care Teams.  These Care Teams include your primary Cardiologist (physician) and Advanced Practice Providers (APPs -  Physician Assistants and Nurse Practitioners) who all work together to provide you with the care you need, when you need it.  Your next appointment:   1 month  The format for your next appointment:   In Person  Provider:   Berniece Salines, DO    Coronary Angiogram With Stent Coronary angiogram  with stent placement is a procedure to widen or open a narrow blood vessel of the heart (coronary artery). Arteries may become blocked by cholesterol buildup (plaques) in the lining of the wall. When a coronary artery becomes partially blocked, blood flow to that area decreases. This may lead to chest pain or a heart attack (myocardial infarction). A stent is a small piece of metal that looks like mesh or a spring. Stent placement may be done as treatment for a heart attack or right after a coronary angiogram in which a blocked artery is found. Let your health care provider know about:  Any allergies you have.  All medicines you are taking, including vitamins, herbs, eye drops, creams, and over-the-counter medicines.  Any problems you or family members have had with anesthetic medicines.  Any blood disorders you have.  Any surgeries you have had.  Any medical conditions you have.  Whether you are pregnant or may be pregnant. What are the risks? Generally, this is a safe procedure. However, problems may occur, including:  Damage to the heart or its blood vessels.  A return of blockage.  Bleeding, infection, or bruising at the insertion site.  A collection of blood under the skin (hematoma) at the insertion site.  A blood clot in another part of the body.  Kidney injury.  Allergic reaction to the dye or contrast that is used.  Bleeding into the abdomen (retroperitoneal bleeding). What happens before the procedure? Staying hydrated Follow instructions from your health care provider about hydration, which may include:  Up to 2 hours before the procedure - you may continue to drink clear liquids, such as water, clear fruit juice, black coffee, and plain tea.  Eating and drinking restrictions Follow instructions from your health care provider about eating and drinking, which may include:  8 hours before the procedure - stop eating heavy meals or foods such as meat, fried foods,  or fatty foods.  6 hours before the procedure - stop eating light meals or foods, such as toast or cereal.  2 hours before the procedure - stop drinking clear liquids. Ask your health care provider about:  Changing or stopping your regular medicines. This is especially important if you are taking diabetes medicines or blood thinners.  Taking medicines such as ibuprofen. These medicines can thin your blood. Do not take these medicines before your procedure if your health care provider instructs you not to. Generally, aspirin is recommended before a procedure of passing a small, thin tube (catheter) through a blood vessel and into the heart (cardiac catheterization). What happens during the procedure?   An IV tube will be inserted into one of your veins.  You will be given one or more of the following: ? A medicine to help you relax (sedative). ? A medicine to numb the area where the catheter will be inserted into an artery (local anesthetic).  To reduce your risk of infection: ? Your health care team will wash or sanitize their hands. ? Your skin will be  washed with soap. ? Hair may be removed from the area where the catheter will be inserted.  Using a guide wire, the catheter will be inserted into an artery. The location may be in your groin, in your wrist, or in the fold of your arm (near your elbow).  A type of X-ray (fluoroscopy) will be used to help guide the catheter to the opening of the arteries in the heart.  A dye will be injected into the catheter, and X-rays will be taken. The dye will help to show where any narrowing or blockages are located in the arteries.  A tiny wire will be guided to the blocked spot, and a balloon will be inflated to make the artery wider.  The stent will be expanded and will crush the plaques into the wall of the vessel. The stent will hold the area open and improve the blood flow. Most stents have a drug coating to reduce the risk of the stent  narrowing over time.  The artery may be made wider using a drill, laser, or other tools to remove plaques.  When the blood flow is better, the catheter will be removed. The lining of the artery will grow over the stent, which stays where it was placed. This procedure may vary among health care providers and hospitals. What happens after the procedure?  If the procedure is done through the leg, you will be kept in bed lying flat for about 6 hours. You will be instructed to not bend and not cross your legs.  The insertion site will be checked frequently.  The pulse in your foot or wrist will be checked frequently.  You may have additional blood tests, X-rays, and a test that records the electrical activity of your heart (electrocardiogram, or ECG). This information is not intended to replace advice given to you by your health care provider. Make sure you discuss any questions you have with your health care provider. Document Released: 03/16/2003 Document Revised: 12/19/2017 Document Reviewed: 04/14/2016 Elsevier Patient Education  2020 [employer].

## 2019-07-08 NOTE — H&P (View-Only) (Signed)
Cardiology Office Note:    Date:  07/08/2019   ID:  Oscar [name], DOB 14-Aug-1931, MRN [id number]  PCP:  Berkley Harvey, NP  Cardiologist:  Berniece Salines, DO  Electrophysiologist:  None   Referring MD: Berkley Harvey, NP   Chief Complaint  Patient presents with  . Follow-up    History of Present Illness:    Oscar [name] is a 85 y.o. male with a hx of  hyperlipidemia, status post ILR placement [id number] syncope, hyperlipidemia chronic LBBB presents today to be evaluated for generalized fatigue, shortness of breath and palpitations. At the conclusion of the visit, given his murmur and heart failure symptoms, I did order an echocardiogram. Due to suspicion of significant valvular heart disease I did the echocardiogram first waiting to see the results before ischemic work-up.  He completed the echocardiogram which showed moderate to severe mitral regurgitation.  So Lasix 40 mg daily was ordered and a pharmacologic nuclear stress test was also ordered.  He did undergo pharmacologic nuclear stress test which was remarkable for ischemia.   The patient is here with his daughter [name] for his results.   Past Medical History:  Diagnosis Date  . Bladder tumor   . Cervical stenosis of spine   . E. coli infection   . GERD (gastroesophageal reflux disease)   . History of appendectomy   . History of concussion    X2   NO RESIDUALS  . History of peptic ulcer 1970'S  . Hyperlipidemia   . Irregular heart beat   . LBBB (left bundle branch block)   . Status post placement of implantable loop recorder 11/08/2014    Past Surgical History:  Procedure Laterality Date  . ANTERIOR CERVICAL DECOMP/DISCECTOMY FUSION N/A 01/27/2018   Procedure: Anterior Cervical Discectomy Fusion - Cervical Four- Cervical Five;  Surgeon: Earnie Larsson, MD;  Location: [facility];  Service: Neurosurgery;  Laterality: N/A;  Anterior Cervical Discectomy Fusion - Cervical Four- Cervical Five  . APPENDECTOMY  1970  .  CARDIOVASCULAR STRESS TEST  03/22/09  . CYSTOSCOPY WITH BIOPSY N/A 02/10/2013   Procedure: CYSTOSCOPY WITH COLD CUP BIOPSY/FULGERATION;  Surgeon: Bernestine Amass, MD;  Location[facility];  Service: Urology;  Laterality: N/A;  ALSO FULGERATION   . INGUINAL HERNIA REPAIR Right 07-14-2002  . LOOP RECORDER IMPLANT N/A 05/24/2014   Procedure: LOOP RECORDER IMPLANT;  Surgeon: Sanda Klein, MD;  Location: [facility];  Service: Cardiovascular;  Laterality: N/A;  . NECK SURGERY    . TONSILLECTOMY    . TRANSURETHRAL RESECTION OF BLADDER TUMOR  03-10-2008    Current Medications: Current Meds  Medication Sig  . aspirin EC 81 MG tablet Take 81 mg by mouth daily.  . furosemide (LASIX) 40 MG tablet Take 1 tablet (40 mg) 3 times a week on Tuesdays, Thursdays and Saturdays  . Glucosamine-Chondroit-Vit C-Mn (GLUCOSAMINE 1500 COMPLEX PO) Take 1 capsule by mouth daily.  Marland Kitchen levothyroxine (SYNTHROID, LEVOTHROID) 25 MCG tablet Take 1 tablet by mouth every Monday, Wednesday, and Friday.   Marland Kitchen MAGNESIUM PO Take 1 tablet by mouth daily.  . Olopatadine HCl 0.2 % SOLN Apply 1 drop to eye as needed.  Marland Kitchen omeprazole (PRILOSEC) 20 MG capsule Take 20 mg by mouth every morning.  Marland Kitchen POTASSIUM PO Take 1 tablet by mouth daily.     Allergies:   Patient has no known allergies.   Social History   Socioeconomic History  . Marital status: Married    Spouse name: Not  on file  . Number of children: Not on file  . Years of education: Not on file  . Highest education level: Not on file  Occupational History  . Not on file  Social Needs  . Financial resource strain: Not on file  . Food insecurity    Worry: Not on file    Inability: Not on file  . Transportation needs    Medical: Not on file    Non-medical: Not on file  Tobacco Use  . Smoking status: Former Smoker    Packs/day: 0.50    Years: 30.00    Pack years: 15.00    Types: Cigarettes    Quit date: 02/06/1979    Years since quitting: 40.4  .  Smokeless tobacco: Never Used  Substance and Sexual Activity  . Alcohol use: Yes    Alcohol/week: 0.0 standard drinks    Comment: 1 glass of beer once or twice a month  . Drug use: No  . Sexual activity: Not on file  Lifestyle  . Physical activity    Days per week: Not on file    Minutes per session: Not on file  . Stress: Not on file  Relationships  . Social Herbalist on phone: Not on file    Gets together: Not on file    Attends religious service: Not on file    Active member of club or organization: Not on file    Attends meetings of clubs or organizations: Not on file    Relationship status: Not on file  Other Topics Concern  . Not on file  Social History Narrative  . Not on file     Family History: The patient's family history includes Diabetes in his brother; Heart failure in his brother and mother; Stroke in his father.  ROS:   Review of Systems  Constitution: Negative for decreased appetite, fever and weight gain.  HENT: Negative for congestion, ear discharge, hoarse voice and sore throat.   Eyes: Negative for discharge, redness, vision loss in right eye and visual halos.  Cardiovascular: Negative for chest pain, dyspnea on exertion, leg swelling, orthopnea and palpitations.  Respiratory: Negative for cough, hemoptysis, shortness of breath and snoring.   Endocrine: Negative for heat intolerance and polyphagia.  Hematologic/Lymphatic: Negative for bleeding problem. Does not bruise/bleed easily.  Skin: Negative for flushing, nail changes, rash and suspicious lesions.  Musculoskeletal: Negative for arthritis, joint pain, muscle cramps, myalgias, neck pain and stiffness.  Gastrointestinal: Negative for abdominal pain, bowel incontinence, diarrhea and excessive appetite.  Genitourinary: Negative for decreased libido, genital sores and incomplete emptying.  Neurological: Negative for brief paralysis, focal weakness, headaches and loss of balance.   Psychiatric/Behavioral: Negative for altered mental status, depression and suicidal ideas.  Allergic/Immunologic: Negative for HIV exposure and persistent infections.    EKGs/Labs/Other Studies Reviewed:    The following studies were reviewed today:   EKG:  None today.  Transthoracic echocardiogram performed on June 29, 2019. 1. Left ventricular ejection fraction, by visual estimation, is 55 to 60%. The left ventricle has normal function. Normal left ventricular size. There is no left ventricular hypertrophy.  2. Abnormal septal motion consistent with left bundle branch block.  3. Left ventricular diastolic Doppler parameters are consistent with impaired relaxation pattern of LV diastolic filling (Grade I Diastolic Dysfunction).  4. Global right ventricle has normal systolic function.The right ventricular size is normal. No increase in right ventricular wall thickness.  5. Left atrial size was normal.  6.  Right atrial size was normal.  7. There is mild posterior mitral annular calcification. Moderate to severe mitral regurgitation. No evidence of mitral stenosis.  8. The tricuspid valve is normal in structure. Tricuspid valve regurgitation is trivial.  9. The aortic valve leaflets are mildly thickened. There is a focal calcification on the noncoronary cusp. No aortic regurgitation or stenosis. 10. The pulmonic valve was not well visualized. Pulmonic valve regurgitation is not visualized by color flow Doppler. 11. Aneurysm of the ascending aorta, measuring 40 mm. 12. Normal pulmonary artery systolic pressure. 52. Compare to TTE performed in June 2015, the moderate to severe mitral regurgitation is new and the ascending aorta is now anuerysm measuring 66mm.  Nuclear stress test:  The left ventricular ejection fraction is normal (55-65%).  Nuclear stress EF: 55%.  There was no ST segment deviation noted during stress.  No T wave inversion was noted during stress.  Defect 1:  There is a large defect of moderate severity.  Findings consistent with ischemia.  This is an intermediate risk study.       Recent Labs: 08/06/2018: ALT 15 06/29/2019: BUN 18; Creatinine, Ser 1.02; Hemoglobin 13.7; Magnesium 2.1; Platelets 226; Potassium 4.5; Sodium 142  Recent Lipid Panel No results found for: CHOL, TRIG, HDL, CHOLHDL, VLDL, LDLCALC, LDLDIRECT  Physical Exam:    VS:  BP 124/74 (BP Location: Right Arm, Patient Position: Sitting, Cuff Size: Normal)   Pulse 69   Temp (!) 97.5 F (36.4 C)   Ht 5\' 7"  (1.702 m)   Wt 164 lb (74.4 kg)   SpO2 97%   BMI 25.69 kg/m     Wt Readings from Last 3 Encounters:  07/08/19 164 lb (74.4 kg)  07/06/19 164 lb (74.4 kg)  06/29/19 164 lb 6.4 oz (74.6 kg)     GEN: Well nourished, well developed in no acute distress HEENT: Normal NECK: No JVD; No carotid bruits LYMPHATICS: No lymphadenopathy CARDIAC: S1S2 noted,RRR, no murmurs, rubs, gallops RESPIRATORY:  Clear to auscultation without rales, wheezing or rhonchi  ABDOMEN: Soft, non-tender, non-distended, +bowel sounds, no guarding. EXTREMITIES: No edema, No cyanosis, no clubbing MUSCULOSKELETAL:  No edema; No deformity  SKIN: Warm and dry NEUROLOGIC:  Alert and oriented x 3, non-focal PSYCHIATRIC:  Normal affect, good insight  ASSESSMENT:    1. Abnormal nuclear stress test   2. Leg edema, left   3. Pre-operative exam   4. Ascending aorta dilatation (HCC)    PLAN:    1.  The patient did have abnormal stress test, and would like to get further testing to understand if he has a blockage given his father died from a heart attack. I was able to speak to the patient and his daughter about the [place] and they understand that risks include but are not limited to stroke (1 in 1000), death (1 in 52), kidney failure [usually temporary] (1 in 500), bleeding (1 in 200), allergic reaction [possibly serious] (1 in 200), and agrees to proceed.  In addition has only seen this more  relief from his Lasix that was recently prescribed.  His right leg has proved greatly but his left leg still does have significant edema patient and his daughters is concerned about a clot in his leg.  Is reasonable at this time will order ultrasound as Doppler to rule out any DVT in the left lower lower extremity.  Continue other medications.  The patient is in agreement with the above plan. The patient left the office in stable condition.  The patient will follow up in 1 month.     Medication Adjustments/Labs and Tests Ordered: Current medicines are reviewed at length with the patient today.  Concerns regarding medicines are outlined above.  Orders Placed This Encounter  Procedures  . DG Chest 2 View  . VAS [place] LOWER EXTREMITY VENOUS (DVT)   No orders of the defined types were placed in this encounter.   Patient Instructions  Medication Instructions:  Your physician recommends that you continue on your current medications as directed. Please refer to the Current Medication list given to you today.  *If you need a refill on your cardiac medications before your next appointment, please call your pharmacy*  Lab Work: None  If you have labs (blood work) drawn today and your tests are completely normal, you will receive your results only by: Marland Kitchen MyChart Message (if you have MyChart) OR . A paper copy in the mail If you have any lab test that is abnormal or we need to change your treatment, we will call you to review the results.  Testing/Procedures:  Your physician has requested that you have a lower or upper extremity venous duplex. This test is an ultrasound of the veins in the legs or arms. It looks at venous blood flow that carries blood from the heart to the legs or arms. Allow one hour for a Lower Venous exam. Allow thirty minutes for an Upper Venous exam. There are no restrictions or special instructions.  A chest x-ray takes a picture of the organs and structures inside the  chest, including the heart, lungs, and blood vessels. This test can show several things, including, whether the heart is enlarges; whether fluid is building up in the lungs; and whether pacemaker / defibrillator leads are still in place.  You will go to the [facility] for a drive-thru pre-procedure COVID test on Saturday, 07/10/2019 at 11:25 am. Please arrive at [place], [place] 15 minutes early. DO NOT go to the tent or the tent line for testing. Go to the building overhang.      [place], [place] [address] Dept: [phone] Loc: [facility]  07/08/2019  You are scheduled for a Cardiac Catheterization on Tuesday, October 20 with Dr. Shelva Majestic.  1. Please arrive at the [facility] (Main Entrance A) at [facility]: [address] [place], [place] [address] at 7:00 AM (This time is two hours before your procedure to ensure your preparation). Free valet parking service is available.   Special note: Every effort is made to have your procedure done on time. Please understand that emergencies sometimes delay scheduled procedures.  2. Diet: Do not eat solid foods after midnight.  The patient may have clear liquids until 5am upon the day of the procedure.  3. Labs: None needed.   4. Medication instructions in preparation for your procedure:   Contrast Allergy: No  DO NOT TAKE furosemide (lasix) the morning of your heart catheterization until after the procedure is completed.   On the morning of your procedure, take your Aspirin and any morning medicines NOT listed above.  You may use sips of water.  5. Plan for one night stay--bring personal belongings. 6. Bring a current list of your medications and current insurance cards. 7. You MUST have a responsible person to drive you home. 8. Someone MUST be with you the  first  24 hours after you arrive home or your discharge will be delayed. 9. Please wear clothes that are easy to get on and off and wear slip-on shoes.  Thank you for allowing [place] to care for you!   -- [facility]   Follow-Up: At [facility], you and your health needs are our priority.  As part of our continuing mission to provide you with exceptional heart care, we have created designated Provider Care Teams.  These Care Teams include your primary Cardiologist (physician) and Advanced Practice Providers (APPs -  Physician Assistants and Nurse Practitioners) who all work together to provide you with the care you need, when you need it.  Your next appointment:   1 month  The format for your next appointment:   In Person  Provider:   Berniece Salines, DO    Coronary Angiogram With Stent Coronary angiogram with stent placement is a procedure to widen or open a narrow blood vessel of the heart (coronary artery). Arteries may become blocked by cholesterol buildup (plaques) in the lining of the wall. When a coronary artery becomes partially blocked, blood flow to that area decreases. This may lead to chest pain or a heart attack (myocardial infarction). A stent is a small piece of metal that looks like mesh or a spring. Stent placement may be done as treatment for a heart attack or right after a coronary angiogram in which a blocked artery is found. Let your health care provider know about:  Any allergies you have.  All medicines you are taking, including vitamins, herbs, eye drops, creams, and over-the-counter medicines.  Any problems you or family members have had with anesthetic medicines.  Any blood disorders you have.  Any surgeries you have had.  Any medical conditions you have.  Whether you are pregnant or may be pregnant. What are the risks? Generally, this is a safe procedure. However, problems may occur, including:  Damage to the heart or its blood  vessels.  A return of blockage.  Bleeding, infection, or bruising at the insertion site.  A collection of blood under the skin (hematoma) at the insertion site.  A blood clot in another part of the body.  Kidney injury.  Allergic reaction to the dye or contrast that is used.  Bleeding into the abdomen (retroperitoneal bleeding). What happens before the procedure? Staying hydrated Follow instructions from your health care provider about hydration, which may include:  Up to 2 hours before the procedure - you may continue to drink clear liquids, such as water, clear fruit juice, black coffee, and plain tea.  Eating and drinking restrictions Follow instructions from your health care provider about eating and drinking, which may include:  8 hours before the procedure - stop eating heavy meals or foods such as meat, fried foods, or fatty foods.  6 hours before the procedure - stop eating light meals or foods, such as toast or cereal.  2 hours before the procedure - stop drinking clear liquids. Ask your health care provider about:  Changing or stopping your regular medicines. This is especially important if you are taking diabetes medicines or blood thinners.  Taking medicines such as ibuprofen. These medicines can thin your blood. Do not take these medicines before your procedure if your health care provider instructs you not to. Generally, aspirin is recommended before a procedure of passing a small, thin tube (catheter) through a blood vessel and into the heart (cardiac catheterization). What happens during the procedure?  An IV tube will be inserted into one of your veins.  You will be given one or more of the following: ? A medicine to help you relax (sedative). ? A medicine to numb the area where the catheter will be inserted into an artery (local anesthetic).  To reduce your risk of infection: ? Your health care team will wash or sanitize their hands. ? Your skin will be  washed with soap. ? Hair may be removed from the area where the catheter will be inserted.  Using a guide wire, the catheter will be inserted into an artery. The location may be in your groin, in your wrist, or in the fold of your arm (near your elbow).  A type of X-ray (fluoroscopy) will be used to help guide the catheter to the opening of the arteries in the heart.  A dye will be injected into the catheter, and X-rays will be taken. The dye will help to show where any narrowing or blockages are located in the arteries.  A tiny wire will be guided to the blocked spot, and a balloon will be inflated to make the artery wider.  The stent will be expanded and will crush the plaques into the wall of the vessel. The stent will hold the area open and improve the blood flow. Most stents have a drug coating to reduce the risk of the stent narrowing over time.  The artery may be made wider using a drill, laser, or other tools to remove plaques.  When the blood flow is better, the catheter will be removed. The lining of the artery will grow over the stent, which stays where it was placed. This procedure may vary among health care providers and hospitals. What happens after the procedure?  If the procedure is done through the leg, you will be kept in bed lying flat for about 6 hours. You will be instructed to not bend and not cross your legs.  The insertion site will be checked frequently.  The pulse in your foot or wrist will be checked frequently.  You may have additional blood tests, X-rays, and a test that records the electrical activity of your heart (electrocardiogram, or ECG). This information is not intended to replace advice given to you by your health care provider. Make sure you discuss any questions you have with your health care provider. Document Released: 03/16/2003 Document Revised: 12/19/2017 Document Reviewed: 04/14/2016 Elsevier Patient Education  2020 [employer].      Adopting a Healthy Lifestyle.  Know what a healthy weight is for you (roughly BMI <25) and aim to maintain this   Aim for 7+ servings of fruits and vegetables daily   65-80+ fluid ounces of water or unsweet tea for healthy kidneys   Limit to max 1 drink of alcohol per day; avoid smoking/tobacco   Limit animal fats in diet for cholesterol and heart health - choose grass fed whenever available   Avoid highly processed foods, and foods high in saturated/trans fats   Aim for low stress - take time to unwind and care for your mental health   Aim for 150 min of moderate intensity exercise weekly for heart health, and weights twice weekly for bone health   Aim for 7-9 hours of sleep daily   When it comes to diets, agreement about the perfect plan isnt easy to find, even among the experts. Experts at the [employer] developed an idea known as the Healthy Eating Plate. Just  imagine a plate divided into logical, healthy portions.   The emphasis is on diet quality:   Load up on vegetables and fruits - one-half of your plate: Aim for color and variety, and remember that potatoes dont count.   Go for whole grains - one-quarter of your plate: Whole wheat, barley, wheat berries, quinoa, oats, brown rice, and foods made with them. If you want pasta, go with whole wheat pasta.   Protein power - one-quarter of your plate: Fish, chicken, beans, and nuts are all healthy, versatile protein sources. Limit red meat.   The diet, however, does go beyond the plate, offering a few other suggestions.   Use healthy plant oils, such as olive, canola, soy, corn, sunflower and peanut. Check the labels, and avoid partially hydrogenated oil, which have unhealthy trans fats.   If youre thirsty, drink water. Coffee and tea are good in moderation, but skip sugary drinks and limit milk and dairy products to one or two daily servings.   The type of carbohydrate in the diet is more important  than the amount. Some sources of carbohydrates, such as vegetables, fruits, whole grains, and beans-are healthier than others.   Finally, stay active  Signed, Berniece Salines, DO  07/08/2019 10:20 PM    [facility]

## 2019-07-08 NOTE — Progress Notes (Signed)
Cardiology Office Note:    Date:  07/08/2019   ID:  Oscar [name], DOB September 04, 1931, MRN [id number]  PCP:  Berkley Harvey, NP  Cardiologist:  Berniece Salines, DO  Electrophysiologist:  None   Referring MD: Berkley Harvey, NP   Chief Complaint  Patient presents with  . Follow-up    History of Present Illness:    Oscar [name] is a 85 y.o. male with a hx of  hyperlipidemia, status post ILR placement [id number] syncope, hyperlipidemia chronic LBBB presents today to be evaluated for generalized fatigue, shortness of breath and palpitations. At the conclusion of the visit, given his murmur and heart failure symptoms, I did order an echocardiogram. Due to suspicion of significant valvular heart disease I did the echocardiogram first waiting to see the results before ischemic work-up.  He completed the echocardiogram which showed moderate to severe mitral regurgitation.  So Lasix 40 mg daily was ordered and a pharmacologic nuclear stress test was also ordered.  He did undergo pharmacologic nuclear stress test which was remarkable for ischemia.   The patient is here with his daughter [name] for his results.   Past Medical History:  Diagnosis Date  . Bladder tumor   . Cervical stenosis of spine   . E. coli infection   . GERD (gastroesophageal reflux disease)   . History of appendectomy   . History of concussion    X2   NO RESIDUALS  . History of peptic ulcer 1970'S  . Hyperlipidemia   . Irregular heart beat   . LBBB (left bundle branch block)   . Status post placement of implantable loop recorder 11/08/2014    Past Surgical History:  Procedure Laterality Date  . ANTERIOR CERVICAL DECOMP/DISCECTOMY FUSION N/A 01/27/2018   Procedure: Anterior Cervical Discectomy Fusion - Cervical Four- Cervical Five;  Surgeon: Earnie Larsson, MD;  Location: [facility];  Service: Neurosurgery;  Laterality: N/A;  Anterior Cervical Discectomy Fusion - Cervical Four- Cervical Five  . APPENDECTOMY  1970  .  CARDIOVASCULAR STRESS TEST  03/22/09  . CYSTOSCOPY WITH BIOPSY N/A 02/10/2013   Procedure: CYSTOSCOPY WITH COLD CUP BIOPSY/FULGERATION;  Surgeon: Bernestine Amass, MD;  Location: [facility];  Service: Urology;  Laterality: N/A;  ALSO FULGERATION   . INGUINAL HERNIA REPAIR Right 07-14-2002  . LOOP RECORDER IMPLANT N/A 05/24/2014   Procedure: LOOP RECORDER IMPLANT;  Surgeon: Sanda Klein, MD;  Location: [facility];  Service: Cardiovascular;  Laterality: N/A;  . NECK SURGERY    . TONSILLECTOMY    . TRANSURETHRAL RESECTION OF BLADDER TUMOR  03-10-2008    Current Medications: Current Meds  Medication Sig  . aspirin EC 81 MG tablet Take 81 mg by mouth daily.  . furosemide (LASIX) 40 MG tablet Take 1 tablet (40 mg) 3 times a week on Tuesdays, Thursdays and Saturdays  . Glucosamine-Chondroit-Vit C-Mn (GLUCOSAMINE 1500 COMPLEX PO) Take 1 capsule by mouth daily.  Marland Kitchen levothyroxine (SYNTHROID, LEVOTHROID) 25 MCG tablet Take 1 tablet by mouth every Monday, Wednesday, and Friday.   Marland Kitchen MAGNESIUM PO Take 1 tablet by mouth daily.  . Olopatadine HCl 0.2 % SOLN Apply 1 drop to eye as needed.  Marland Kitchen omeprazole (PRILOSEC) 20 MG capsule Take 20 mg by mouth every morning.  Marland Kitchen POTASSIUM PO Take 1 tablet by mouth daily.     Allergies:   Patient has no known allergies.   Social History   Socioeconomic History  . Marital status: Married    Spouse name: Not  on file  . Number of children: Not on file  . Years of education: Not on file  . Highest education level: Not on file  Occupational History  . Not on file  Social Needs  . Financial resource strain: Not on file  . Food insecurity    Worry: Not on file    Inability: Not on file  . Transportation needs    Medical: Not on file    Non-medical: Not on file  Tobacco Use  . Smoking status: Former Smoker    Packs/day: 0.50    Years: 30.00    Pack years: 15.00    Types: Cigarettes    Quit date: 02/06/1979    Years since quitting: 40.4  .  Smokeless tobacco: Never Used  Substance and Sexual Activity  . Alcohol use: Yes    Alcohol/week: 0.0 standard drinks    Comment: 1 glass of beer once or twice a month  . Drug use: No  . Sexual activity: Not on file  Lifestyle  . Physical activity    Days per week: Not on file    Minutes per session: Not on file  . Stress: Not on file  Relationships  . Social Herbalist on phone: Not on file    Gets together: Not on file    Attends religious service: Not on file    Active member of club or organization: Not on file    Attends meetings of clubs or organizations: Not on file    Relationship status: Not on file  Other Topics Concern  . Not on file  Social History Narrative  . Not on file     Family History: The patient's family history includes Diabetes in his brother; Heart failure in his brother and mother; Stroke in his father.  ROS:   Review of Systems  Constitution: Negative for decreased appetite, fever and weight gain.  HENT: Negative for congestion, ear discharge, hoarse voice and sore throat.   Eyes: Negative for discharge, redness, vision loss in right eye and visual halos.  Cardiovascular: Negative for chest pain, dyspnea on exertion, leg swelling, orthopnea and palpitations.  Respiratory: Negative for cough, hemoptysis, shortness of breath and snoring.   Endocrine: Negative for heat intolerance and polyphagia.  Hematologic/Lymphatic: Negative for bleeding problem. Does not bruise/bleed easily.  Skin: Negative for flushing, nail changes, rash and suspicious lesions.  Musculoskeletal: Negative for arthritis, joint pain, muscle cramps, myalgias, neck pain and stiffness.  Gastrointestinal: Negative for abdominal pain, bowel incontinence, diarrhea and excessive appetite.  Genitourinary: Negative for decreased libido, genital sores and incomplete emptying.  Neurological: Negative for brief paralysis, focal weakness, headaches and loss of balance.   Psychiatric/Behavioral: Negative for altered mental status, depression and suicidal ideas.  Allergic/Immunologic: Negative for HIV exposure and persistent infections.    EKGs/Labs/Other Studies Reviewed:    The following studies were reviewed today:   EKG:  None today.  Transthoracic echocardiogram performed on June 29, 2019. 1. Left ventricular ejection fraction, by visual estimation, is 55 to 60%. The left ventricle has normal function. Normal left ventricular size. There is no left ventricular hypertrophy.  2. Abnormal septal motion consistent with left bundle branch block.  3. Left ventricular diastolic Doppler parameters are consistent with impaired relaxation pattern of LV diastolic filling (Grade I Diastolic Dysfunction).  4. Global right ventricle has normal systolic function.The right ventricular size is normal. No increase in right ventricular wall thickness.  5. Left atrial size was normal.  6.  Right atrial size was normal.  7. There is mild posterior mitral annular calcification. Moderate to severe mitral regurgitation. No evidence of mitral stenosis.  8. The tricuspid valve is normal in structure. Tricuspid valve regurgitation is trivial.  9. The aortic valve leaflets are mildly thickened. There is a focal calcification on the noncoronary cusp. No aortic regurgitation or stenosis. 10. The pulmonic valve was not well visualized. Pulmonic valve regurgitation is not visualized by color flow Doppler. 11. Aneurysm of the ascending aorta, measuring 40 mm. 12. Normal pulmonary artery systolic pressure. 32. Compare to TTE performed in June 2015, the moderate to severe mitral regurgitation is new and the ascending aorta is now anuerysm measuring 21mm.  Nuclear stress test:  The left ventricular ejection fraction is normal (55-65%).  Nuclear stress EF: 55%.  There was no ST segment deviation noted during stress.  No T wave inversion was noted during stress.  Defect 1:  There is a large defect of moderate severity.  Findings consistent with ischemia.  This is an intermediate risk study.       Recent Labs: 08/06/2018: ALT 15 06/29/2019: BUN 18; Creatinine, Ser 1.02; Hemoglobin 13.7; Magnesium 2.1; Platelets 226; Potassium 4.5; Sodium 142  Recent Lipid Panel No results found for: CHOL, TRIG, HDL, CHOLHDL, VLDL, LDLCALC, LDLDIRECT  Physical Exam:    VS:  BP 124/74 (BP Location: Right Arm, Patient Position: Sitting, Cuff Size: Normal)   Pulse 69   Temp (!) 97.5 F (36.4 C)   Ht 5\' 7"  (1.702 m)   Wt 164 lb (74.4 kg)   SpO2 97%   BMI 25.69 kg/m     Wt Readings from Last 3 Encounters:  07/08/19 164 lb (74.4 kg)  07/06/19 164 lb (74.4 kg)  06/29/19 164 lb 6.4 oz (74.6 kg)     GEN: Well nourished, well developed in no acute distress HEENT: Normal NECK: No JVD; No carotid bruits LYMPHATICS: No lymphadenopathy CARDIAC: S1S2 noted,RRR, no murmurs, rubs, gallops RESPIRATORY:  Clear to auscultation without rales, wheezing or rhonchi  ABDOMEN: Soft, non-tender, non-distended, +bowel sounds, no guarding. EXTREMITIES: No edema, No cyanosis, no clubbing MUSCULOSKELETAL:  No edema; No deformity  SKIN: Warm and dry NEUROLOGIC:  Alert and oriented x 3, non-focal PSYCHIATRIC:  Normal affect, good insight  ASSESSMENT:    1. Abnormal nuclear stress test   2. Leg edema, left   3. Pre-operative exam   4. Ascending aorta dilatation (HCC)    PLAN:    1.  The patient did have abnormal stress test, and would like to get further testing to understand if he has a blockage given his father died from a heart attack. I was able to speak to the patient and his daughter about the [place] and they understand that risks include but are not limited to stroke (1 in 1000), death (1 in 42), kidney failure [usually temporary] (1 in 500), bleeding (1 in 200), allergic reaction [possibly serious] (1 in 200), and agrees to proceed.  In addition has only seen this more  relief from his Lasix that was recently prescribed.  His right leg has proved greatly but his left leg still does have significant edema patient and his daughters is concerned about a clot in his leg.  Is reasonable at this time will order ultrasound as Doppler to rule out any DVT in the left lower lower extremity.  Continue other medications.  The patient is in agreement with the above plan. The patient left the office in stable condition.  The patient will follow up in 1 month.     Medication Adjustments/Labs and Tests Ordered: Current medicines are reviewed at length with the patient today.  Concerns regarding medicines are outlined above.  Orders Placed This Encounter  Procedures  . DG Chest 2 View  . VAS [place] LOWER EXTREMITY VENOUS (DVT)   No orders of the defined types were placed in this encounter.   Patient Instructions  Medication Instructions:  Your physician recommends that you continue on your current medications as directed. Please refer to the Current Medication list given to you today.  *If you need a refill on your cardiac medications before your next appointment, please call your pharmacy*  Lab Work: None  If you have labs (blood work) drawn today and your tests are completely normal, you will receive your results only by: Marland Kitchen MyChart Message (if you have MyChart) OR . A paper copy in the mail If you have any lab test that is abnormal or we need to change your treatment, we will call you to review the results.  Testing/Procedures:  Your physician has requested that you have a lower or upper extremity venous duplex. This test is an ultrasound of the veins in the legs or arms. It looks at venous blood flow that carries blood from the heart to the legs or arms. Allow one hour for a Lower Venous exam. Allow thirty minutes for an Upper Venous exam. There are no restrictions or special instructions.  A chest x-ray takes a picture of the organs and structures inside the  chest, including the heart, lungs, and blood vessels. This test can show several things, including, whether the heart is enlarges; whether fluid is building up in the lungs; and whether pacemaker / defibrillator leads are still in place.  You will go to the [facility] for a drive-thru pre-procedure COVID test on Saturday, 07/10/2019 at 11:25 am. Please arrive at [place], [place] 15 minutes early. DO NOT go to the tent or the tent line for testing. Go to the building overhang.      [place], [place] [address] Dept: [phone] Loc: [place]  07/08/2019  You are scheduled for a Cardiac Catheterization on Tuesday, October 20 with Dr. Shelva Majestic.  1. Please arrive at the [facility] (Main Entrance A) at [facility]: [address] [place], [place] [address] at 7:00 AM (This time is two hours before your procedure to ensure your preparation). Free valet parking service is available.   Special note: Every effort is made to have your procedure done on time. Please understand that emergencies sometimes delay scheduled procedures.  2. Diet: Do not eat solid foods after midnight.  The patient may have clear liquids until 5am upon the day of the procedure.  3. Labs: None needed.   4. Medication instructions in preparation for your procedure:   Contrast Allergy: No  DO NOT TAKE furosemide (lasix) the morning of your heart catheterization until after the procedure is completed.   On the morning of your procedure, take your Aspirin and any morning medicines NOT listed above.  You may use sips of water.  5. Plan for one night stay--bring personal belongings. 6. Bring a current list of your medications and current insurance cards. 7. You MUST have a responsible person to drive you home. 8. Someone MUST be with you the  first  24 hours after you arrive home or your discharge will be delayed. 9. Please wear clothes that are easy to get on and off and wear slip-on shoes.  Thank you for allowing [place] to care for you!   -- [place] [facility]   Follow-Up: At [facility], you and your health needs are our priority.  As part of our continuing mission to provide you with exceptional heart care, we have created designated Provider Care Teams.  These Care Teams include your primary Cardiologist (physician) and Advanced Practice Providers (APPs -  Physician Assistants and Nurse Practitioners) who all work together to provide you with the care you need, when you need it.  Your next appointment:   1 month  The format for your next appointment:   In Person  Provider:   Berniece Salines, DO    Coronary Angiogram With Stent Coronary angiogram with stent placement is a procedure to widen or open a narrow blood vessel of the heart (coronary artery). Arteries may become blocked by cholesterol buildup (plaques) in the lining of the wall. When a coronary artery becomes partially blocked, blood flow to that area decreases. This may lead to chest pain or a heart attack (myocardial infarction). A stent is a small piece of metal that looks like mesh or a spring. Stent placement may be done as treatment for a heart attack or right after a coronary angiogram in which a blocked artery is found. Let your health care provider know about:  Any allergies you have.  All medicines you are taking, including vitamins, herbs, eye drops, creams, and over-the-counter medicines.  Any problems you or family members have had with anesthetic medicines.  Any blood disorders you have.  Any surgeries you have had.  Any medical conditions you have.  Whether you are pregnant or may be pregnant. What are the risks? Generally, this is a safe procedure. However, problems may occur, including:  Damage to the heart or its blood  vessels.  A return of blockage.  Bleeding, infection, or bruising at the insertion site.  A collection of blood under the skin (hematoma) at the insertion site.  A blood clot in another part of the body.  Kidney injury.  Allergic reaction to the dye or contrast that is used.  Bleeding into the abdomen (retroperitoneal bleeding). What happens before the procedure? Staying hydrated Follow instructions from your health care provider about hydration, which may include:  Up to 2 hours before the procedure - you may continue to drink clear liquids, such as water, clear fruit juice, black coffee, and plain tea.  Eating and drinking restrictions Follow instructions from your health care provider about eating and drinking, which may include:  8 hours before the procedure - stop eating heavy meals or foods such as meat, fried foods, or fatty foods.  6 hours before the procedure - stop eating light meals or foods, such as toast or cereal.  2 hours before the procedure - stop drinking clear liquids. Ask your health care provider about:  Changing or stopping your regular medicines. This is especially important if you are taking diabetes medicines or blood thinners.  Taking medicines such as ibuprofen. These medicines can thin your blood. Do not take these medicines before your procedure if your health care provider instructs you not to. Generally, aspirin is recommended before a procedure of passing a small, thin tube (catheter) through a blood vessel and into the heart (cardiac catheterization). What happens during the procedure?  An IV tube will be inserted into one of your veins.  You will be given one or more of the following: ? A medicine to help you relax (sedative). ? A medicine to numb the area where the catheter will be inserted into an artery (local anesthetic).  To reduce your risk of infection: ? Your health care team will wash or sanitize their hands. ? Your skin will be  washed with soap. ? Hair may be removed from the area where the catheter will be inserted.  Using a guide wire, the catheter will be inserted into an artery. The location may be in your groin, in your wrist, or in the fold of your arm (near your elbow).  A type of X-ray (fluoroscopy) will be used to help guide the catheter to the opening of the arteries in the heart.  A dye will be injected into the catheter, and X-rays will be taken. The dye will help to show where any narrowing or blockages are located in the arteries.  A tiny wire will be guided to the blocked spot, and a balloon will be inflated to make the artery wider.  The stent will be expanded and will crush the plaques into the wall of the vessel. The stent will hold the area open and improve the blood flow. Most stents have a drug coating to reduce the risk of the stent narrowing over time.  The artery may be made wider using a drill, laser, or other tools to remove plaques.  When the blood flow is better, the catheter will be removed. The lining of the artery will grow over the stent, which stays where it was placed. This procedure may vary among health care providers and hospitals. What happens after the procedure?  If the procedure is done through the leg, you will be kept in bed lying flat for about 6 hours. You will be instructed to not bend and not cross your legs.  The insertion site will be checked frequently.  The pulse in your foot or wrist will be checked frequently.  You may have additional blood tests, X-rays, and a test that records the electrical activity of your heart (electrocardiogram, or ECG). This information is not intended to replace advice given to you by your health care provider. Make sure you discuss any questions you have with your health care provider. Document Released: 03/16/2003 Document Revised: 12/19/2017 Document Reviewed: 04/14/2016 Elsevier Patient Education  2020 [employer].      Adopting a Healthy Lifestyle.  Know what a healthy weight is for you (roughly BMI <25) and aim to maintain this   Aim for 7+ servings of fruits and vegetables daily   65-80+ fluid ounces of water or unsweet tea for healthy kidneys   Limit to max 1 drink of alcohol per day; avoid smoking/tobacco   Limit animal fats in diet for cholesterol and heart health - choose grass fed whenever available   Avoid highly processed foods, and foods high in saturated/trans fats   Aim for low stress - take time to unwind and care for your mental health   Aim for 150 min of moderate intensity exercise weekly for heart health, and weights twice weekly for bone health   Aim for 7-9 hours of sleep daily   When it comes to diets, agreement about the perfect plan isnt easy to find, even among the experts. Experts at the [employer] developed an idea known as the Healthy Eating Plate. Just  imagine a plate divided into logical, healthy portions.   The emphasis is on diet quality:   Load up on vegetables and fruits - one-half of your plate: Aim for color and variety, and remember that potatoes dont count.   Go for whole grains - one-quarter of your plate: Whole wheat, barley, wheat berries, quinoa, oats, brown rice, and foods made with them. If you want pasta, go with whole wheat pasta.   Protein power - one-quarter of your plate: Fish, chicken, beans, and nuts are all healthy, versatile protein sources. Limit red meat.   The diet, however, does go beyond the plate, offering a few other suggestions.   Use healthy plant oils, such as olive, canola, soy, corn, sunflower and peanut. Check the labels, and avoid partially hydrogenated oil, which have unhealthy trans fats.   If youre thirsty, drink water. Coffee and tea are good in moderation, but skip sugary drinks and limit milk and dairy products to one or two daily servings.   The type of carbohydrate in the diet is more important  than the amount. Some sources of carbohydrates, such as vegetables, fruits, whole grains, and beans-are healthier than others.   Finally, stay active  Signed, Berniece Salines, DO  07/08/2019 10:20 PM    [facility]

## 2019-07-10 ENCOUNTER — Other Ambulatory Visit (HOSPITAL_COMMUNITY)
Admission: RE | Admit: 2019-07-10 | Discharge: 2019-07-10 | Disposition: A | Discharge status: Home / Self Care | Payer: Medicare Other | Source: Ambulatory Visit | Attending: Cardiovascular Disease | Admitting: Cardiovascular Disease

## 2019-07-10 DIAGNOSIS — Z20828 Contact with and (suspected) exposure to other viral communicable diseases: Secondary | ICD-10-CM | POA: Insufficient documentation

## 2019-07-10 DIAGNOSIS — Z01812 Encounter for preprocedural laboratory examination: Secondary | ICD-10-CM | POA: Diagnosis present

## 2019-07-11 LAB — NOVEL CORONAVIRUS, NAA (HOSP ORDER, SEND-OUT TO REF LAB; TAT 18-24 HRS): SARS-CoV-2, NAA: NOT DETECTED

## 2019-07-12 ENCOUNTER — Telehealth: Payer: Self-pay | Admitting: *Deleted

## 2019-07-12 NOTE — Telephone Encounter (Signed)
Pt contacted pre-catheterization scheduled at [facility] for: Tuesday July 13, 2019 9 AM Verified arrival time and place: [facility]) at: 7 AM   No solid food after midnight prior to cath, clear liquids until 5 AM day of procedure. Contrast allergy: no  Hold: Lasix-AM of procedure.  Except hold medications AM meds can be  taken pre-cath with sip of water including: ASA 81 mg   Confirmed patient has responsible adult to drive home post procedure and observe 24 hours after arriving home: yes  Currently, due to Covid-19 pandemic, only one support person will be allowed with patient. Must be the same support person for that patient's entire stay, will be screened and required to wear a mask. They will be asked to wait in the waiting room for the duration of the patient's stay.  Patients are required to wear a mask when they enter the hospital.      COVID-19 Pre-Screening Questions:  . In the past 7 to 10 days have you had a cough,  shortness of breath, headache, congestion, fever (100 or greater) body aches, chills, sore throat, or sudden loss of taste or sense of smell? no . Have you been around anyone with known Covid 19? no . Have you been around anyone who is awaiting Covid 19 test results in the past 7 to 10 days? no . Have you been around anyone who has been exposed to Covid 19, or has mentioned symptoms of Covid 19 within the past 7 to 10 days? no  I reviewed procedure/mask/visitor, Covid-19 screening questions with patient, he verbalized understanding.

## 2019-07-13 ENCOUNTER — Ambulatory Visit (HOSPITAL_COMMUNITY)
Admission: RE | Admit: 2019-07-13 | Discharge: 2019-07-13 | Disposition: A | Discharge status: Home / Self Care | Payer: Medicare Other | Attending: Cardiovascular Disease | Admitting: Cardiovascular Disease

## 2019-07-13 ENCOUNTER — Encounter (HOSPITAL_COMMUNITY)
Admission: RE | Disposition: A | Discharge status: Home / Self Care | Payer: Self-pay | Source: Home / Self Care | Attending: Cardiovascular Disease

## 2019-07-13 ENCOUNTER — Encounter (HOSPITAL_COMMUNITY): Payer: Self-pay | Admitting: Cardiovascular Disease

## 2019-07-13 ENCOUNTER — Other Ambulatory Visit: Payer: Self-pay

## 2019-07-13 DIAGNOSIS — I447 Left bundle-branch block, unspecified: Secondary | ICD-10-CM | POA: Insufficient documentation

## 2019-07-13 DIAGNOSIS — Z8249 Family history of ischemic heart disease and other diseases of the circulatory system: Secondary | ICD-10-CM | POA: Diagnosis not present

## 2019-07-13 DIAGNOSIS — K219 Gastro-esophageal reflux disease without esophagitis: Secondary | ICD-10-CM | POA: Diagnosis not present

## 2019-07-13 DIAGNOSIS — R0609 Other forms of dyspnea: Secondary | ICD-10-CM

## 2019-07-13 DIAGNOSIS — R9439 Abnormal result of other cardiovascular function study: Secondary | ICD-10-CM | POA: Insufficient documentation

## 2019-07-13 DIAGNOSIS — Z79890 Hormone replacement therapy (postmenopausal): Secondary | ICD-10-CM | POA: Insufficient documentation

## 2019-07-13 DIAGNOSIS — Z7982 Long term (current) use of aspirin: Secondary | ICD-10-CM | POA: Insufficient documentation

## 2019-07-13 DIAGNOSIS — I2510 Atherosclerotic heart disease of native coronary artery without angina pectoris: Secondary | ICD-10-CM | POA: Insufficient documentation

## 2019-07-13 DIAGNOSIS — E785 Hyperlipidemia, unspecified: Secondary | ICD-10-CM | POA: Diagnosis not present

## 2019-07-13 DIAGNOSIS — Z79899 Other long term (current) drug therapy: Secondary | ICD-10-CM | POA: Insufficient documentation

## 2019-07-13 DIAGNOSIS — I77819 Aortic ectasia, unspecified site: Secondary | ICD-10-CM | POA: Insufficient documentation

## 2019-07-13 DIAGNOSIS — R600 Localized edema: Secondary | ICD-10-CM | POA: Insufficient documentation

## 2019-07-13 DIAGNOSIS — Z87891 Personal history of nicotine dependence: Secondary | ICD-10-CM | POA: Insufficient documentation

## 2019-07-13 HISTORY — PX: LEFT HEART CATH AND CORONARY ANGIOGRAPHY: CATH118249

## 2019-07-13 SURGERY — LEFT HEART CATH AND CORONARY ANGIOGRAPHY
Anesthesia: LOCAL

## 2019-07-13 MED ORDER — FENTANYL CITRATE (PF) 100 MCG/2ML IJ SOLN
INTRAMUSCULAR | Status: DC | PRN
  Administered 2019-07-13: 25 ug via INTRAVENOUS

## 2019-07-13 MED ORDER — SODIUM CHLORIDE 0.9 % WEIGHT BASED INFUSION
3.0000 mL/kg/h | INTRAVENOUS | Status: DC
  Administered 2019-07-13: 3 mL/kg/h via INTRAVENOUS

## 2019-07-13 MED ORDER — IOHEXOL 350 MG/ML SOLN
INTRAVENOUS | Status: DC | PRN
  Administered 2019-07-13: 10:00:00 40 mL

## 2019-07-13 MED ORDER — HEPARIN (PORCINE) IN NACL 1000-0.9 UT/500ML-% IV SOLN
INTRAVENOUS | Status: DC | PRN
  Administered 2019-07-13 (×2): 500 mL

## 2019-07-13 MED ORDER — SODIUM CHLORIDE 0.9% FLUSH: 3.0000 mL | Freq: Two times a day (BID) | INTRAVENOUS | Status: DC

## 2019-07-13 MED ORDER — ACETAMINOPHEN 325 MG PO TABS: 650.0000 mg | ORAL_TABLET | ORAL | Status: DC | PRN

## 2019-07-13 MED ORDER — VERAPAMIL HCL 2.5 MG/ML IV SOLN
INTRAVENOUS | Status: DC | PRN
  Administered 2019-07-13: 10:00:00 10 mL via INTRA_ARTERIAL

## 2019-07-13 MED ORDER — LIDOCAINE HCL (PF) 1 % IJ SOLN
INTRAMUSCULAR | Status: DC | PRN
  Administered 2019-07-13: 2 mL

## 2019-07-13 MED ORDER — ASPIRIN 81 MG PO CHEW: 81.0000 mg | CHEWABLE_TABLET | Freq: Every day | ORAL | Status: DC

## 2019-07-13 MED ORDER — DIAZEPAM 2 MG PO TABS: 2.0000 mg | ORAL_TABLET | Freq: Four times a day (QID) | ORAL | Status: DC | PRN

## 2019-07-13 MED ORDER — LIDOCAINE HCL (PF) 1 % IJ SOLN
INTRAMUSCULAR | Status: AC
  Filled 2019-07-13: qty 30

## 2019-07-13 MED ORDER — LABETALOL HCL 5 MG/ML IV SOLN: 10.0000 mg | INTRAVENOUS | Status: DC | PRN

## 2019-07-13 MED ORDER — ONDANSETRON HCL 4 MG/2ML IJ SOLN: 4.0000 mg | Freq: Four times a day (QID) | INTRAMUSCULAR | Status: DC | PRN

## 2019-07-13 MED ORDER — VERAPAMIL HCL 2.5 MG/ML IV SOLN
INTRAVENOUS | Status: AC
  Filled 2019-07-13: qty 2

## 2019-07-13 MED ORDER — HYDRALAZINE HCL 20 MG/ML IJ SOLN: 10.0000 mg | INTRAMUSCULAR | Status: DC | PRN

## 2019-07-13 MED ORDER — SODIUM CHLORIDE 0.9 % IV SOLN: 250.0000 mL | INTRAVENOUS | Status: DC | PRN

## 2019-07-13 MED ORDER — SODIUM CHLORIDE 0.9% FLUSH: 3.0000 mL | INTRAVENOUS | Status: DC | PRN

## 2019-07-13 MED ORDER — MIDAZOLAM HCL 2 MG/2ML IJ SOLN
INTRAMUSCULAR | Status: AC
  Filled 2019-07-13: qty 2

## 2019-07-13 MED ORDER — SODIUM CHLORIDE 0.9 % IV SOLN: INTRAVENOUS | Status: DC

## 2019-07-13 MED ORDER — HEPARIN SODIUM (PORCINE) 1000 UNIT/ML IJ SOLN
INTRAMUSCULAR | Status: AC
  Filled 2019-07-13: qty 1

## 2019-07-13 MED ORDER — MIDAZOLAM HCL 2 MG/2ML IJ SOLN
INTRAMUSCULAR | Status: DC | PRN
  Administered 2019-07-13 (×2): 0.5 mg via INTRAVENOUS

## 2019-07-13 MED ORDER — SODIUM CHLORIDE 0.9 % WEIGHT BASED INFUSION: 1.0000 mL/kg/h | INTRAVENOUS | Status: DC

## 2019-07-13 MED ORDER — FENTANYL CITRATE (PF) 100 MCG/2ML IJ SOLN
INTRAMUSCULAR | Status: AC
  Filled 2019-07-13: qty 2

## 2019-07-13 MED ORDER — HEPARIN SODIUM (PORCINE) 1000 UNIT/ML IJ SOLN
INTRAMUSCULAR | Status: DC | PRN
  Administered 2019-07-13: 3500 [IU] via INTRAVENOUS

## 2019-07-13 MED ORDER — HEPARIN (PORCINE) IN NACL 1000-0.9 UT/500ML-% IV SOLN
INTRAVENOUS | Status: AC
  Filled 2019-07-13: qty 1000

## 2019-07-13 SURGICAL SUPPLY — 12 items
CATH INFINITI 5FR ANG PIGTAIL (CATHETERS) ×1 IMPLANT
CATH INFINITI JR4 5F (CATHETERS) ×1 IMPLANT
CATH OPTITORQUE TIG 4.0 5F (CATHETERS) ×1 IMPLANT
DEVICE RAD COMP TR BAND LRG (VASCULAR PRODUCTS) ×1 IMPLANT
GLIDESHEATH SLEND SS 6F .021 (SHEATH) ×1 IMPLANT
GUIDEWIRE INQWIRE 1.5J.035X260 (WIRE) IMPLANT
INQWIRE 1.5J .035X260CM (WIRE) ×2
KIT HEART LEFT (KITS) ×2 IMPLANT
PACK CARDIAC CATHETERIZATION (CUSTOM PROCEDURE TRAY) ×2 IMPLANT
SHEATH PROBE COVER 6X72 (BAG) ×1 IMPLANT
TRANSDUCER W/STOPCOCK (MISCELLANEOUS) ×2 IMPLANT
TUBING CIL FLEX 10 FLL-RA (TUBING) ×2 IMPLANT

## 2019-07-13 NOTE — Interval H&P Note (Signed)
Cath Lab Visit (complete for each Cath Lab visit)  Clinical Evaluation Leading to the Procedure:   ACS: No.  Non-ACS:    Anginal Classification: CCS III  Anti-ischemic medical therapy: No Therapy  Non-Invasive Test Results: Intermediate-risk stress test findings: cardiac mortality 1-3%/year  Prior CABG: No previous CABG      History and Physical Interval Note:  07/13/2019 9:37 AM  Oscar [name]  has presented today for surgery, with the diagnosis of abnormal nuclear stress test.  The various methods of treatment have been discussed with the patient and family. After consideration of risks, benefits and other oCath Lab Visit (complete for each Cath Lab visit)  Clinical Evaluation Leading to the Procedure:   ACS: No.  Non-ACS:    Anginal Classification: CCS II  Anti-ischemic medical therapy: No Therapy  Non-Invasive Test Results: Intermediate-risk stress test findings: cardiac mortality 1-3%/year  Prior CABG: No previous CABG      ptions for treatment, the patient has consented to  Procedure(s): LEFT HEART CATH AND CORONARY ANGIOGRAPHY (N/A) as a surgical intervention.  The patient's history has been reviewed, patient examined, no change in status, stable for surgery.  I have reviewed the patient's chart and labs.  Questions were answered to the patient's satisfaction.     Shelva Majestic

## 2019-07-13 NOTE — Discharge Instructions (Signed)
Radial Site Care ° °This sheet gives you information about how to care for yourself after your procedure. Your health care provider may also give you more specific instructions. If you have problems or questions, contact your health care provider. °What can I expect after the procedure? °After the procedure, it is common to have: °· Bruising and tenderness at the catheter insertion area. °Follow these instructions at home: °Medicines °· Take over-the-counter and prescription medicines only as told by your health care provider. °Insertion site care °· Follow instructions from your health care provider about how to take care of your insertion site. Make sure you: °? Wash your hands with soap and water before you change your bandage (dressing). If soap and water are not available, use hand sanitizer. °? Change your dressing as told by your health care provider. °? Leave stitches (sutures), skin glue, or adhesive strips in place. These skin closures may need to stay in place for 2 weeks or longer. If adhesive strip edges start to loosen and curl up, you may trim the loose edges. Do not remove adhesive strips completely unless your health care provider tells you to do that. °· Check your insertion site every day for signs of infection. Check for: °? Redness, swelling, or pain. °? Fluid or blood. °? Pus or a bad smell. °? Warmth. °· Do not take baths, swim, or use a hot tub until your health care provider approves. °· You may shower 24-48 hours after the procedure, or as directed by your health care provider. °? Remove the dressing and gently wash the site with plain soap and water. °? Pat the area dry with a clean towel. °? Do not rub the site. That could cause bleeding. °· Do not apply powder or lotion to the site. °Activity ° °· For 24 hours after the procedure, or as directed by your health care provider: °? Do not flex or bend the affected arm. °? Do not push or pull heavy objects with the affected arm. °? Do not  drive yourself home from the hospital or clinic. You may drive 24 hours after the procedure unless your health care provider tells you not to. °? Do not operate machinery or power tools. °· Do not lift anything that is heavier than 10 lb (4.5 kg), or the limit that you are told, until your health care provider says that it is safe. °· Ask your health care provider when it is okay to: °? Return to work or school. °? Resume usual physical activities or sports. °? Resume sexual activity. °General instructions °· If the catheter site starts to bleed, raise your arm and put firm pressure on the site. If the bleeding does not stop, get help right away. This is a medical emergency. °· If you went home on the same day as your procedure, a responsible adult should be with you for the first 24 hours after you arrive home. °· Keep all follow-up visits as told by your health care provider. This is important. °Contact a health care provider if: °· You have a fever. °· You have redness, swelling, or yellow drainage around your insertion site. °Get help right away if: °· You have unusual pain at the radial site. °· The catheter insertion area swells very fast. °· The insertion area is bleeding, and the bleeding does not stop when you hold steady pressure on the area. °· Your arm or hand becomes pale, cool, tingly, or numb. °These symptoms may represent a serious problem   that is an emergency. Do not wait to see if the symptoms will go away. Get medical help right away. Call your local emergency services (911 in the [place] Do not drive yourself to the hospital. °Summary °· After the procedure, it is common to have bruising and tenderness at the site. °· Follow instructions from your health care provider about how to take care of your radial site wound. Check the wound every day for signs of infection. °· Do not lift anything that is heavier than 10 lb (4.5 kg), or the limit that you are told, until your health care provider says  that it is safe. °This information is not intended to replace advice given to you by your health care provider. Make sure you discuss any questions you have with your health care provider. °Document Released: 10/12/2010 Document Revised: 10/15/2017 Document Reviewed: 10/15/2017 °Elsevier Patient Education © 2020 [employer] ° °

## 2019-07-15 ENCOUNTER — Other Ambulatory Visit: Payer: Self-pay

## 2019-07-15 ENCOUNTER — Ambulatory Visit (HOSPITAL_BASED_OUTPATIENT_CLINIC_OR_DEPARTMENT_OTHER)
Admission: RE | Admit: 2019-07-15 | Discharge: 2019-07-15 | Disposition: A | Discharge status: Home / Self Care | Payer: Medicare Other | Source: Ambulatory Visit | Attending: Cardiology | Admitting: Cardiology

## 2019-07-15 DIAGNOSIS — R600 Localized edema: Secondary | ICD-10-CM | POA: Insufficient documentation

## 2019-07-15 NOTE — Progress Notes (Signed)
  Echocardiogram 2D Echocardiogram has been performed.  Oscar [name] 07/15/2019, 1:45 PM

## 2019-07-19 ENCOUNTER — Telehealth: Payer: Self-pay | Admitting: *Deleted

## 2019-07-19 NOTE — Telephone Encounter (Signed)
-----   Message from Berniece Salines, DO sent at 07/17/2019  9:54 PM EDT ----- US duplex normal study. No DVT

## 2019-07-19 NOTE — Telephone Encounter (Signed)
Telephone call to patient. Left message that Lower extremety dopplars were normal and to call with any questions.

## 2019-07-23 DIAGNOSIS — E063 Autoimmune thyroiditis: Secondary | ICD-10-CM

## 2019-07-23 DIAGNOSIS — E038 Other specified hypothyroidism: Secondary | ICD-10-CM

## 2019-07-23 HISTORY — DX: Autoimmune thyroiditis: E06.3

## 2019-07-23 HISTORY — DX: Other specified hypothyroidism: E03.8

## 2019-08-03 ENCOUNTER — Ambulatory Visit: Payer: Medicare Other | Admitting: Cardiology

## 2019-08-05 ENCOUNTER — Other Ambulatory Visit: Payer: Self-pay

## 2019-08-05 ENCOUNTER — Encounter: Payer: Self-pay | Admitting: Cardiology

## 2019-08-05 ENCOUNTER — Ambulatory Visit (INDEPENDENT_AMBULATORY_CARE_PROVIDER_SITE_OTHER): Payer: Medicare Other | Admitting: Cardiology

## 2019-08-05 VITALS — BP 120/80 | HR 66 | Ht 67.0 in | Wt 162.0 lb

## 2019-08-05 DIAGNOSIS — I4719 Other supraventricular tachycardia: Secondary | ICD-10-CM

## 2019-08-05 DIAGNOSIS — I2510 Atherosclerotic heart disease of native coronary artery without angina pectoris: Secondary | ICD-10-CM

## 2019-08-05 DIAGNOSIS — I872 Venous insufficiency (chronic) (peripheral): Secondary | ICD-10-CM

## 2019-08-05 DIAGNOSIS — I480 Paroxysmal atrial fibrillation: Secondary | ICD-10-CM | POA: Insufficient documentation

## 2019-08-05 DIAGNOSIS — E782 Mixed hyperlipidemia: Secondary | ICD-10-CM

## 2019-08-05 DIAGNOSIS — I471 Supraventricular tachycardia: Secondary | ICD-10-CM

## 2019-08-05 HISTORY — DX: Supraventricular tachycardia: I47.1

## 2019-08-05 HISTORY — DX: Atherosclerotic heart disease of native coronary artery without angina pectoris: I25.10

## 2019-08-05 HISTORY — DX: Other supraventricular tachycardia: I47.19

## 2019-08-05 MED ORDER — ATORVASTATIN CALCIUM 10 MG PO TABS: 10.0000 mg | ORAL_TABLET | Freq: Every day | ORAL | 3 refills | Status: DC

## 2019-08-05 MED ORDER — METOPROLOL SUCCINATE ER 25 MG PO TB24: 12.5000 mg | ORAL_TABLET | Freq: Every day | ORAL | 3 refills | Status: DC

## 2019-08-05 NOTE — Progress Notes (Signed)
Cardiology Office Note:    Date:  08/05/2019   ID:  Oscar [name], DOB Jan 21, 1931, MRN [id number]  PCP:  Berkley Harvey, NP  Cardiologist:  Berniece Salines, DO  Electrophysiologist:  None   Referring MD: Berkley Harvey, NP   Chief Complaint  Patient presents with  . Follow-up   History of Present Illness:    Oscar [name] is a 85 y.o. male with a hx of hyperlipidemia, status post ILR placement in 2016 for near syncope, chronic left bundle branch block, initially presented on 06/29/2019 for concern of shortness of breath and palpitations.  At the conclusion of the visit given his murmur and his heart failure symptoms an echocardiogram was ordered and the patient was started on Lasix 40 mg times daily.  He was also recommended to undergo a pharmacologic nuclear stress test which was reported as abnormal remarkable for ischemia therefore I recommended patient undergo left heart catheterization.  He did go left heart catheterization on 07/13/2019 which revealed nonobstructive coronary artery disease.  In the meantime the patient wore them ZIO monitor is here today to discuss the results.  The patient is here today with his daughter.  Past Medical History:  Diagnosis Date  . Bladder tumor   . Cervical stenosis of spine   . E. coli infection   . GERD (gastroesophageal reflux disease)   . History of appendectomy   . History of concussion    X2   NO RESIDUALS  . History of peptic ulcer 1970'S  . Hyperlipidemia   . Irregular heart beat   . LBBB (left bundle branch block)   . Status post placement of implantable loop recorder 11/08/2014    Past Surgical History:  Procedure Laterality Date  . ANTERIOR CERVICAL DECOMP/DISCECTOMY FUSION N/A 01/27/2018   Procedure: Anterior Cervical Discectomy Fusion - Cervical Four- Cervical Five;  Surgeon: Earnie Larsson, MD;  Location: [facility];  Service: Neurosurgery;  Laterality: N/A;  Anterior Cervical Discectomy Fusion - Cervical Four- Cervical Five  .  APPENDECTOMY  1970  . CARDIOVASCULAR STRESS TEST  03/22/09  . CYSTOSCOPY WITH BIOPSY N/A 02/10/2013   Procedure: CYSTOSCOPY WITH COLD CUP BIOPSY/FULGERATION;  Surgeon: Bernestine Amass, MD;  Location: [facility];  Service: Urology;  Laterality: N/A;  ALSO FULGERATION   . INGUINAL HERNIA REPAIR Right 07-14-2002  . LEFT HEART CATH AND CORONARY ANGIOGRAPHY N/A 07/13/2019   Procedure: LEFT HEART CATH AND CORONARY ANGIOGRAPHY;  Surgeon: Troy Sine, MD;  Location: [facility];  Service: Cardiovascular;  Laterality: N/A;  . LOOP RECORDER IMPLANT N/A 05/24/2014   Procedure: LOOP RECORDER IMPLANT;  Surgeon: Sanda Klein, MD;  Location: [facility];  Service: Cardiovascular;  Laterality: N/A;  . NECK SURGERY    . TONSILLECTOMY    . TRANSURETHRAL RESECTION OF BLADDER TUMOR  03-10-2008    Current Medications: Current Meds  Medication Sig  . aspirin EC 81 MG tablet Take 81 mg by mouth daily.  . diclofenac sodium (VOLTAREN) 1 % GEL Apply 2 g topically daily. Ankle  . furosemide (LASIX) 40 MG tablet Take 1 tablet (40 mg) 3 times a week on Tuesdays, Thursdays and Saturdays (Patient taking differently: Take 40 mg by mouth every Tuesday, Thursday, and Saturday at 6 PM. Take 1 tablet (40 mg) 3 times a week on Tuesdays, Thursdays and Saturdays)  . Glucosamine-Chondroit-Vit C-Mn (GLUCOSAMINE 1500 COMPLEX PO) Take 1 capsule by mouth daily.  Marland Kitchen MAGNESIUM PO Take 250 mg by mouth daily.   Marland Kitchen  Olopatadine HCl 0.2 % SOLN Place 1 drop into both eyes as needed (Dry eye).   Marland Kitchen omeprazole (PRILOSEC) 20 MG capsule Take 20 mg by mouth every morning.  . Potassium 99 MG TABS Take 198 mg by mouth daily.   Marland Kitchen TIROSINT 25 MCG CAPS Take 1 capsule by mouth 3 (three) times a week.     Allergies:   Patient has no known allergies.   Social History   Socioeconomic History  . Marital status: Married    Spouse name: Not on file  . Number of children: Not on file  . Years of education: Not on file  .  Highest education level: Not on file  Occupational History  . Not on file  Social Needs  . Financial resource strain: Not on file  . Food insecurity    Worry: Not on file    Inability: Not on file  . Transportation needs    Medical: Not on file    Non-medical: Not on file  Tobacco Use  . Smoking status: Former Smoker    Packs/day: 0.50    Years: 30.00    Pack years: 15.00    Types: Cigarettes    Quit date: 02/06/1979    Years since quitting: 40.5  . Smokeless tobacco: Never Used  Substance and Sexual Activity  . Alcohol use: Yes    Alcohol/week: 0.0 standard drinks    Comment: 1 glass of beer once or twice a month  . Drug use: No  . Sexual activity: Not on file  Lifestyle  . Physical activity    Days per week: Not on file    Minutes per session: Not on file  . Stress: Not on file  Relationships  . Social Herbalist on phone: Not on file    Gets together: Not on file    Attends religious service: Not on file    Active member of club or organization: Not on file    Attends meetings of clubs or organizations: Not on file    Relationship status: Not on file  Other Topics Concern  . Not on file  Social History Narrative  . Not on file     Family History: The patient's family history includes Diabetes in his brother; Heart failure in his brother and mother; Stroke in his father.  ROS:   Review of Systems  Constitution: Negative for decreased appetite, fever and weight gain.  HENT: Negative for congestion, ear discharge, hoarse voice and sore throat.   Eyes: Negative for discharge, redness, vision loss in right eye and visual halos.  Cardiovascular: Negative for chest pain, dyspnea on exertion, leg swelling, orthopnea and palpitations.  Respiratory: Negative for cough, hemoptysis, shortness of breath and snoring.   Endocrine: Negative for heat intolerance and polyphagia.  Hematologic/Lymphatic: Negative for bleeding problem. Does not bruise/bleed easily.   Skin: Negative for flushing, nail changes, rash and suspicious lesions.  Musculoskeletal: Negative for arthritis, joint pain, muscle cramps, myalgias, neck pain and stiffness.  Gastrointestinal: Negative for abdominal pain, bowel incontinence, diarrhea and excessive appetite.  Genitourinary: Negative for decreased libido, genital sores and incomplete emptying.  Neurological: Negative for brief paralysis, focal weakness, headaches and loss of balance.  Psychiatric/Behavioral: Negative for altered mental status, depression and suicidal ideas.  Allergic/Immunologic: Negative for HIV exposure and persistent infections.    EKGs/Labs/Other Studies Reviewed:    The following studies were reviewed today:   EKG: None today   ZIO monitor 07/15/2019 The patient wore the  ZIO monitor for a total of 3-day 18 hours, starting June 29, 2019. Indication: History of syncope Minimum heart rate was 56 bpm, maximum heart rate was 171 bpm with average heart rate 73 bpm. Predominant underlying rhythm was sinus.  Of note interventricular conduction delay were present. A total of 40 supraventricular tachycardia episodes occurred.  The fastest interval lasting 14 beats with a maximum heart rate of 171 bpm (the average heart rate was 128 bpm).  Of note the run with the fastest interval was also the longest episode. Premature atrial complexes were rare, less than 1%. Premature ventricular complexes were rare, less than 1%. No ventricular tachycardia, no pauses, no AV block, no atrial fibrillation noted. Patient triggered event associated with sinus rhythm, 3 total diary events associated with sinus rhythm. Conclusion: This study is remarkable for supraventricular tachycardia likely atrial tachycardia.  Left heart cath 06/2019  Prox RCA lesion is 10% stenosed.  Prox Cx to Mid Cx lesion is 20% stenosed.  Mid Cx lesion is 30% stenosed.  1st Diag lesion is 20% stenosed.  The left ventricular systolic  function is normal.  LV end diastolic pressure is normal.  The left ventricular ejection fraction is 50-55% by visual estimate.  There is trivial (1+) mitral regurgitation.  Mild nonobstructive CAD with 20% narrowing in a proximal diagonal branch of the LAD prior to its bifurcation and a otherwise normal LAD system; smooth proximal 20% left circumflex stenosis followed by 30% smooth eccentric mid AV groove stenosis; and mild 10% smooth narrowing in the mid dominant RCA. Low normal global LV function with EF estimate at 50 to 55% in this patient with chronic left bundle branch block.  There appears to be only trivial mitral regurgitation. LVEDP 13 mm Hg. RECOMMENDATION: Medical therapy for mild nonobstructive CAD.  Recent Labs: 08/06/2018: ALT 15 06/29/2019: BUN 18; Creatinine, Ser 1.02; Hemoglobin 13.7; Magnesium 2.1; Platelets 226; Potassium 4.5; Sodium 142  Recent Lipid Panel No results found for: CHOL, TRIG, HDL, CHOLHDL, VLDL, LDLCALC, LDLDIRECT  Physical Exam:    VS:  BP 120/80 (BP Location: Left Arm, Patient Position: Sitting, Cuff Size: Normal)   Pulse 66   Ht 5\' 7"  (1.702 m)   Wt 162 lb (73.5 kg)   SpO2 98%   BMI 25.37 kg/m     Wt Readings from Last 3 Encounters:  08/05/19 162 lb (73.5 kg)  07/13/19 160 lb (72.6 kg)  07/08/19 164 lb (74.4 kg)     GEN: Well nourished, well developed in no acute distress HEENT: Normal NECK: No JVD; No carotid bruits LYMPHATICS: No lymphadenopathy CARDIAC: S1S2 noted,RRR, no murmurs, rubs, gallops RESPIRATORY:  Clear to auscultation without rales, wheezing or rhonchi  ABDOMEN: Soft, non-tender, non-distended, +bowel sounds, no guarding. EXTREMITIES: No edema, No cyanosis, no clubbing MUSCULOSKELETAL:  No edema; No deformity  SKIN: Warm and dry NEUROLOGIC:  Alert and oriented x 3, non-focal PSYCHIATRIC:  Normal affect, good insight  ASSESSMENT:    1. Paroxysmal atrial tachycardia ([place])   2. Coronary artery disease involving  native coronary artery of native heart without angina pectoris   3. Mixed hyperlipidemia   4. Venous insufficiency of left leg    PLAN:    1.  Paroxysmal atrial tachycardia-40 episodes noted on his ZIO monitor.  I have discussed with patient, at this time he is agreeable to start Toprol-XL 12.5 mg daily.  Have educated the patient about the side effects include dizziness and fatigue for this medication.  He will notify my office if  he experience any of these.  2.  Coronary artery disease-mild obstructive disease noted on his recent cardiac catheterization.  He is currently on aspirin 1 mg daily.  He is not on a statin and tells me that he was previously on simvastatin who gives him muscle aches and pains and notes that he had been on atorvastatin in the past.  Therefore I am going to start gently with this medication starting Lipitor at 10 mg daily.  He also wants to really invest in healthy diet which includes increasing his fruits and vegetables as well as sweets.  3.  Hyperlipidemia-starting statin as listed above.  4.  Shortness of breath has improved significantly that even the patient does not really remember his prior experiences.  He will continue to take his Lasix 40 mg 3 times a week.  5.  Left leg and is insufficiency-an ultrasound Doppler of his lower leg left foot done which reported no deep vein thrombosis.  This is still persistent especially in the setting of his diuretic use and tells me that the compression stockings does not help.  He is interested in speaking with the vascular surgeons therefore I will refer the patient at this time.   The patient is in agreement with the above plan. The patient left the office in stable condition.  The patient will follow up in 3 months   Medication Adjustments/Labs and Tests Ordered: Current medicines are reviewed at length with the patient today.  Concerns regarding medicines are outlined above.  No orders of the defined types were placed  in this encounter.  No orders of the defined types were placed in this encounter.   There are no Patient Instructions on file for this visit.   Adopting a Healthy Lifestyle.  Know what a healthy weight is for you (roughly BMI <25) and aim to maintain this   Aim for 7+ servings of fruits and vegetables daily   65-80+ fluid ounces of water or unsweet tea for healthy kidneys   Limit to max 1 drink of alcohol per day; avoid smoking/tobacco   Limit animal fats in diet for cholesterol and heart health - choose grass fed whenever available   Avoid highly processed foods, and foods high in saturated/trans fats   Aim for low stress - take time to unwind and care for your mental health   Aim for 150 min of moderate intensity exercise weekly for heart health, and weights twice weekly for bone health   Aim for 7-9 hours of sleep daily   When it comes to diets, agreement about the perfect plan isnt easy to find, even among the experts. Experts at the [employer] developed an idea known as the Healthy Eating Plate. Just imagine a plate divided into logical, healthy portions.   The emphasis is on diet quality:   Load up on vegetables and fruits - one-half of your plate: Aim for color and variety, and remember that potatoes dont count.   Go for whole grains - one-quarter of your plate: Whole wheat, barley, wheat berries, quinoa, oats, brown rice, and foods made with them. If you want pasta, go with whole wheat pasta.   Protein power - one-quarter of your plate: Fish, chicken, beans, and nuts are all healthy, versatile protein sources. Limit red meat.   The diet, however, does go beyond the plate, offering a few other suggestions.   Use healthy plant oils, such as olive, canola, soy, corn, sunflower and peanut. Check the  labels, and avoid partially hydrogenated oil, which have unhealthy trans fats.   If youre thirsty, drink water. Coffee and tea are good in moderation,  but skip sugary drinks and limit milk and dairy products to one or two daily servings.   The type of carbohydrate in the diet is more important than the amount. Some sources of carbohydrates, such as vegetables, fruits, whole grains, and beans-are healthier than others.   Finally, stay active  Signed, Berniece Salines, DO  08/05/2019 11:41 AM    [place]

## 2019-08-05 NOTE — Patient Instructions (Signed)
Medication Instructions:  START LIPITOR 10 MG DAILY START  TOPROL XL 12.5 MG DAILY *If you need a refill on your cardiac medications before your next appointment, please call your pharmacy*  Lab Work:  LIPID PANEL PRIOR TO NEXT VISIT   If you have labs (blood work) drawn today and your tests are completely normal, you will receive your results only by: Marland Kitchen MyChart Message (if you have MyChart) OR . A paper copy in the mail If you have any lab test that is abnormal or we need to change your treatment, we will call you to review the results.  Testing/Procedures: None Ordered  Follow-Up: At [facility], you and your health needs are our priority.  As part of our continuing mission to provide you with exceptional heart care, we have created designated Provider Care Teams.  These Care Teams include your primary Cardiologist (physician) and Advanced Practice Providers (APPs -  Physician Assistants and Nurse Practitioners) who all work together to provide you with the care you need, when you need it.  Your next appointment:   3 months  The format for your next appointment:   In Person  Provider:   You may see Berniece Salines, DO or the following Advanced Practice Provider on your designated Care Team:    Laurann Montana, FNP   Other Instructions  We are placing a referral to Vascular Surgery. You will be contacted to schedule an appointment.

## 2019-08-16 ENCOUNTER — Telehealth: Payer: Self-pay | Admitting: *Deleted

## 2019-08-16 NOTE — Telephone Encounter (Signed)
-----   Message from Alben Spittle sent at 08/16/2019  8:50 AM EST ----- Regarding: FW: Loop recorder removal  ----- Message ----- From: Ricci Barker, RN Sent: 07/22/2019   9:23 AM EST To: Alben Spittle Subject: RE: Loop recorder removal                      Hey!  When I called him to schedule it, he said he wanted to wait and that he would call when he was ready to have it removed.   If he has decided to remove it then we can. Whatever you need me to do, just let me know.   Thanks, Lattie Haw ----- Message ----- From: Alben Spittle Sent: 07/22/2019   9:19 AM EDT To: Ricci Barker, RN Subject: RE: Loop recorder removal                      Veva Holes,   I just wanted to follow up on this. Sherri asked me because she got a message regarding the referral Dr. Harriet Masson placed.  Thank you,  [place] ----- Message ----- From: Sanda Klein, MD Sent: 06/29/2019   4:40 PM EDT To: Ricci Barker, RN, Alben Spittle Subject: RE: Loop recorder removal                      I can remove it in the office and we do not need to have a preliminary visit to discuss it. Just have to figure out the logistics, since we are limiting the volume of MDs in the office. Let me figure out the schedule. Please tell him to expect a call from Orin later this week. Thanks ----- Message ----- From: Alben Spittle Sent: 06/29/2019   4:03 PM EDT To: Sanda Klein, MD, Ricci Barker, RN Subject: Loop recorder removal                          Good afternoon Dr. Sallyanne Kuster,   This pt recently saw Dr. Harriet Masson in [facility] and it was discussed having his loop removed. Pt saw you in the past for his loop recorder. Dr. Harriet Masson referred the patient to Dr. Curt Bears, but pt states that he is fine with having the appointment with you, he doesn't feel the need to switch providers or step one toes. Do you take loop recorders out in the office or the hospital? If you do in office removals, do they need  two separate appointments to discuss and then remove or do you do it all at once? How much time do you need if you do it in the office?   Thank you,  [facility]

## 2019-08-31 ENCOUNTER — Other Ambulatory Visit: Payer: Self-pay

## 2019-08-31 DIAGNOSIS — R2242 Localized swelling, mass and lump, left lower limb: Secondary | ICD-10-CM

## 2019-09-08 ENCOUNTER — Inpatient Hospital Stay (HOSPITAL_COMMUNITY): Admission: RE | Admit: 2019-09-08 | Payer: Medicare Other | Source: Ambulatory Visit

## 2019-09-08 ENCOUNTER — Encounter: Payer: Medicare Other | Admitting: Family

## 2019-10-28 ENCOUNTER — Ambulatory Visit: Payer: Medicare Other | Admitting: Cardiology

## 2020-01-07 ENCOUNTER — Encounter: Payer: Self-pay | Admitting: Cardiology

## 2020-01-07 ENCOUNTER — Ambulatory Visit (INDEPENDENT_AMBULATORY_CARE_PROVIDER_SITE_OTHER): Payer: Medicare Other | Admitting: Cardiology

## 2020-01-07 ENCOUNTER — Other Ambulatory Visit: Payer: Self-pay

## 2020-01-07 VITALS — BP 118/70 | HR 61 | Ht 67.0 in | Wt 162.0 lb

## 2020-01-07 DIAGNOSIS — E782 Mixed hyperlipidemia: Secondary | ICD-10-CM

## 2020-01-07 DIAGNOSIS — R55 Syncope and collapse: Secondary | ICD-10-CM

## 2020-01-07 DIAGNOSIS — I4719 Other supraventricular tachycardia: Secondary | ICD-10-CM

## 2020-01-07 DIAGNOSIS — I471 Supraventricular tachycardia: Secondary | ICD-10-CM | POA: Diagnosis not present

## 2020-01-07 DIAGNOSIS — I2510 Atherosclerotic heart disease of native coronary artery without angina pectoris: Secondary | ICD-10-CM

## 2020-01-07 NOTE — Patient Instructions (Signed)
Medication Instructions:  Your physician recommends that you continue on your current medications as directed. Please refer to the Current Medication list given to you today.  *If you need a refill on your cardiac medications before your next appointment, please call your pharmacy*   Lab Work: None If you have labs (blood work) drawn today and your tests are completely normal, you will receive your results only by: Marland Kitchen MyChart Message (if you have MyChart) OR . A paper copy in the mail If you have any lab test that is abnormal or we need to change your treatment, we will call you to review the results.   Testing/Procedures: None   Follow-Up: At [facility], you and your health needs are our priority.  As part of our continuing mission to provide you with exceptional heart care, we have created designated Provider Care Teams.  These Care Teams include your primary Cardiologist (physician) and Advanced Practice Providers (APPs -  Physician Assistants and Nurse Practitioners) who all work together to provide you with the care you need, when you need it.  We recommend signing up for the patient portal called "MyChart".  Sign up information is provided on this After Visit Summary.  MyChart is used to connect with patients for Virtual Visits (Telemedicine).  Patients are able to view lab/test results, encounter notes, upcoming appointments, etc.  Non-urgent messages can be sent to your provider as well.   To learn more about what you can do with MyChart, go to [email or web address].    Your next appointment:   6 week(s)  The format for your next appointment:   In Person  Provider:   Shirlee More, MD or Dr. Harriet Masson   Other Instructions Please purchase a BP cuff and record your BP daily and if you have an episode.   Please purchase AliveCor for your smart phone to help you monitor your heart beat.

## 2020-01-07 NOTE — Progress Notes (Signed)
Cardiology Office Note:    Date:  01/07/2020   ID:  Oscar [name], DOB 03/21/1931, MRN [id number]  PCP:  Berkley Harvey, NP  Cardiologist:  Shirlee More, MD    Referring MD: Berkley Harvey, NP    ASSESSMENT:    1. Paroxysmal atrial tachycardia ([place])   2. Near syncope   3. Coronary artery disease involving native coronary artery of native heart without angina pectoris   4. Mixed hyperlipidemia    PLAN:    In order of problems listed above:  1. He has had another episode of was probably best described as near syncope.  Frequent I offered to put another event monitor on he declines plan as outlined below.  Now continue low-dose beta-blocker  2. stable CAD continue medical therapy 3. He will discuss lipid-lowering therapy at his next visit.   Next appointment: 3 months   Medication Adjustments/Labs and Tests Ordered: Current medicines are reviewed at length with the patient today.  Concerns regarding medicines are outlined above.  Orders Placed This Encounter  Procedures  . EKG 12-Lead   No orders of the defined types were placed in this encounter.   No chief complaint on file.   History of Present Illness:    Oscar [name] is a 86 y.o. male with a hx of paroxysmal atrial tachycardia, mild nonobstructive CAD, left bundle branch block, syncope with an implanted loop recorder implanted and lower extremity swelling last seen 08/05/2019.  A lower extremity vascular duplex venous done 07/15/2019 that showed no evidence of DVT in the left lower extremity.  He was started on a loop diuretic with shortness of breath and edema at his last visit.  His ejection fraction gated pool 07/06/2019 normal 55% an echocardiogram at that time 55 to 60% with abnormal septal motion due to bundle branch block and grade 1 diastolic dysfunction.  Is also noted to have mitral annular calcification with moderate to severe mitral regurgitation coronary angiography he had trivial 1+ mitral regurgitation  and normal left ventricular end-diastolic pressure. Compliance with diet, lifestyle and medications: Is seen by me today compliant with medications but unnerved after another episode of weakness walking on his driveway  He went to the mail felt very weak almost fainted came in the house and recovered in a few minutes.  No recurrence but he is quite unsettled.  I was going check routine labs but he tells me they just had extensive lab work done through [employer] and is awaiting results.  He has had a previous implanted loop recorder he is worn external monitors he has had no clinical recurrence of arrhythmia.  He asked me what he can do in the future and I encouraged him to purchase a blood pressure cuff at home start checking his blood pressure daily and if he has an episode record the blood pressure and heart rate with it and to purchase the iPhone adapter to capture cardiac events.  He will come back and see Dr. Jorene Minors in 6 weeks and hopefully his lab work will be available.  He is not having chest pain exertional shortness of breath and did not lose consciousness. Past Medical History:  Diagnosis Date  . Bladder tumor   . Cervical stenosis of spine   . E. coli infection   . GERD (gastroesophageal reflux disease)   . History of appendectomy   . History of concussion    X2   NO RESIDUALS  . History of peptic ulcer 1970'S  .  Hyperlipidemia   . Irregular heart beat   . LBBB (left bundle branch block)   . Status post placement of implantable loop recorder 11/08/2014    Past Surgical History:  Procedure Laterality Date  . ANTERIOR CERVICAL DECOMP/DISCECTOMY FUSION N/A 01/27/2018   Procedure: Anterior Cervical Discectomy Fusion - Cervical Four- Cervical Five;  Surgeon: Earnie Larsson, MD;  Location: [facility];  Service: Neurosurgery;  Laterality: N/A;  Anterior Cervical Discectomy Fusion - Cervical Four- Cervical Five  . APPENDECTOMY  1970  . CARDIOVASCULAR STRESS TEST  03/22/09  . CYSTOSCOPY WITH  BIOPSY N/A 02/10/2013   Procedure: CYSTOSCOPY WITH COLD CUP BIOPSY/FULGERATION;  Surgeon: Bernestine Amass, MD;  Location: [facility];  Service: Urology;  Laterality: N/A;  ALSO FULGERATION   . INGUINAL HERNIA REPAIR Right 07-14-2002  . LEFT HEART CATH AND CORONARY ANGIOGRAPHY N/A 07/13/2019   Procedure: LEFT HEART CATH AND CORONARY ANGIOGRAPHY;  Surgeon: Troy Sine, MD;  Location: [place] [facility];  Service: Cardiovascular;  Laterality: N/A;  . LOOP RECORDER IMPLANT N/A 05/24/2014   Procedure: LOOP RECORDER IMPLANT;  Surgeon: Sanda Klein, MD;  Location: [place] [facility];  Service: Cardiovascular;  Laterality: N/A;  . NECK SURGERY    . TONSILLECTOMY    . TRANSURETHRAL RESECTION OF BLADDER TUMOR  03-10-2008    Current Medications: Current Meds  Medication Sig  . aspirin EC 81 MG tablet Take 81 mg by mouth daily.  . furosemide (LASIX) 40 MG tablet Take 1 tablet (40 mg) 3 times a week on Tuesdays, Thursdays and Saturdays  . Glucosamine-Chondroit-Vit C-Mn (GLUCOSAMINE 1500 COMPLEX PO) Take 1 capsule by mouth daily.  Marland Kitchen MAGNESIUM PO Take 250 mg by mouth daily.   . metoprolol succinate (TOPROL XL) 25 MG 24 hr tablet Take 0.5 tablets (12.5 mg total) by mouth daily.  . Olopatadine HCl 0.2 % SOLN Place 1 drop into both eyes as needed (Dry eye).   Marland Kitchen omeprazole (PRILOSEC) 20 MG capsule Take 20 mg by mouth every morning.  . Potassium 99 MG TABS Take 198 mg by mouth daily.   Marland Kitchen TIROSINT 25 MCG CAPS Take 1 capsule by mouth 3 (three) times a week.     Allergies:   Patient has no known allergies.   Social History   Socioeconomic History  . Marital status: Married    Spouse name: Not on file  . Number of children: Not on file  . Years of education: Not on file  . Highest education level: Not on file  Occupational History  . Not on file  Tobacco Use  . Smoking status: Former Smoker    Packs/day: 0.50    Years: 30.00    Pack years: 15.00    Types: Cigarettes    Quit date:  02/06/1979    Years since quitting: 40.9  . Smokeless tobacco: Never Used  Substance and Sexual Activity  . Alcohol use: Yes    Alcohol/week: 0.0 standard drinks    Comment: 1 glass of beer once or twice a month  . Drug use: No  . Sexual activity: Not on file  Other Topics Concern  . Not on file  Social History Narrative  . Not on file   Social Determinants of Health   Financial Resource Strain:   . Difficulty of Paying Living Expenses:   Food Insecurity:   . Worried About Charity fundraiser in the Last Year:   . Arboriculturist in the Last Year:   Transportation Needs:   .  Lack of Transportation (Medical):   Marland Kitchen Lack of Transportation (Non-Medical):   Physical Activity:   . Days of Exercise per Week:   . Minutes of Exercise per Session:   Stress:   . Feeling of Stress :   Social Connections:   . Frequency of Communication with Friends and Family:   . Frequency of Social Gatherings with Friends and Family:   . Attends Religious Services:   . Active Member of Clubs or Organizations:   . Attends Archivist Meetings:   Marland Kitchen Marital Status:      Family History: The patient's family history includes Diabetes in his brother; Heart failure in his brother and mother; Stroke in his father. ROS:   Please see the history of present illness.    All other systems reviewed and are negative.  EKGs/Labs/Other Studies Reviewed:    The following studies were reviewed today:  EKG:  EKG ordered today and personally reviewed.  The ekg ordered today demonstrates left bundle branch block sinus rhythm  Recent Labs: 06/29/2019: BUN 18; Creatinine, Ser 1.02; Hemoglobin 13.7; Magnesium 2.1; Platelets 226; Potassium 4.5; Sodium 142  Recent Lipid Panel No results found for: CHOL, TRIG, HDL, CHOLHDL, VLDL, LDLCALC, LDLDIRECT  Physical Exam:    VS:  BP 118/70   Pulse 61   Ht 5\' 7"  (1.702 m)   Wt 162 lb (73.5 kg)   SpO2 96%   BMI 25.37 kg/m     Wt Readings from Last 3 Encounters:   01/07/20 162 lb (73.5 kg)  08/05/19 162 lb (73.5 kg)  07/13/19 160 lb (72.6 kg)     GEN:  Well nourished, well developed in no acute distress HEENT: Normal NECK: No JVD; No carotid bruits LYMPHATICS: No lymphadenopathy CARDIAC: RRR, no murmurs, rubs, gallops RESPIRATORY:  Clear to auscultation without rales, wheezing or rhonchi  ABDOMEN: Soft, non-tender, non-distended MUSCULOSKELETAL:  No edema; No deformity  SKIN: Warm and dry NEUROLOGIC:  Alert and oriented x 3 PSYCHIATRIC:  Normal affect    Signed, Shirlee More, MD  01/07/2020 4:31 PM    [facility]

## 2020-01-09 ENCOUNTER — Other Ambulatory Visit: Payer: Self-pay | Admitting: Cardiology

## 2020-01-18 DIAGNOSIS — R0600 Dyspnea, unspecified: Secondary | ICD-10-CM

## 2020-01-18 DIAGNOSIS — R0609 Other forms of dyspnea: Secondary | ICD-10-CM

## 2020-01-18 HISTORY — DX: Dyspnea, unspecified: R06.00

## 2020-01-18 HISTORY — DX: Other forms of dyspnea: R06.09

## 2020-01-22 ENCOUNTER — Other Ambulatory Visit: Payer: Self-pay

## 2020-01-22 ENCOUNTER — Emergency Department (HOSPITAL_BASED_OUTPATIENT_CLINIC_OR_DEPARTMENT_OTHER)
Admission: EM | Admit: 2020-01-22 | Discharge: 2020-01-22 | Disposition: A | Discharge status: Home Health | Payer: Medicare Other | Attending: Emergency Medicine | Admitting: Emergency Medicine

## 2020-01-22 ENCOUNTER — Encounter (HOSPITAL_BASED_OUTPATIENT_CLINIC_OR_DEPARTMENT_OTHER): Payer: Self-pay | Admitting: Emergency Medicine

## 2020-01-22 DIAGNOSIS — R42 Dizziness and giddiness: Secondary | ICD-10-CM | POA: Diagnosis not present

## 2020-01-22 DIAGNOSIS — I480 Paroxysmal atrial fibrillation: Secondary | ICD-10-CM | POA: Insufficient documentation

## 2020-01-22 DIAGNOSIS — Z7982 Long term (current) use of aspirin: Secondary | ICD-10-CM | POA: Insufficient documentation

## 2020-01-22 DIAGNOSIS — R531 Weakness: Secondary | ICD-10-CM | POA: Diagnosis not present

## 2020-01-22 DIAGNOSIS — Z87891 Personal history of nicotine dependence: Secondary | ICD-10-CM | POA: Diagnosis not present

## 2020-01-22 DIAGNOSIS — I2510 Atherosclerotic heart disease of native coronary artery without angina pectoris: Secondary | ICD-10-CM | POA: Insufficient documentation

## 2020-01-22 DIAGNOSIS — I447 Left bundle-branch block, unspecified: Secondary | ICD-10-CM | POA: Insufficient documentation

## 2020-01-22 DIAGNOSIS — Z79899 Other long term (current) drug therapy: Secondary | ICD-10-CM | POA: Diagnosis not present

## 2020-01-22 LAB — I-STAT CHEM 8, ED
BUN: 14 mg/dL (ref 8–23)
Calcium, Ion: 1.22 mmol/L (ref 1.15–1.40)
Chloride: 101 mmol/L (ref 98–111)
Creatinine, Ser: 1 mg/dL (ref 0.61–1.24)
Glucose, Bld: 81 mg/dL (ref 70–99)
HCT: 39 % (ref 39.0–52.0)
Hemoglobin: 13.3 g/dL (ref 13.0–17.0)
Potassium: 4 mmol/L (ref 3.5–5.1)
Sodium: 137 mmol/L (ref 135–145)
TCO2: 28 mmol/L (ref 22–32)

## 2020-01-22 LAB — CBC
HCT: 42.9 % (ref 39.0–52.0)
Hemoglobin: 14.1 g/dL (ref 13.0–17.0)
MCH: 31 pg (ref 26.0–34.0)
MCHC: 32.9 g/dL (ref 30.0–36.0)
MCV: 94.3 fL (ref 80.0–100.0)
Platelets: 237 10*3/uL (ref 150–400)
RBC: 4.55 MIL/uL (ref 4.22–5.81)
RDW: 13.1 % (ref 11.5–15.5)
WBC: 5 10*3/uL (ref 4.0–10.5)
nRBC: 0 % (ref 0.0–0.2)

## 2020-01-22 LAB — TSH: TSH: 2.137 u[IU]/mL (ref 0.350–4.500)

## 2020-01-22 LAB — TROPONIN I (HIGH SENSITIVITY): Troponin I (High Sensitivity): 6 ng/L (ref <18)

## 2020-01-22 NOTE — Discharge Instructions (Addendum)
Please follow up with your cardiologist as scheduled on Monday.

## 2020-01-22 NOTE — ED Provider Notes (Signed)
[facility] EMERGENCY DEPARTMENT Provider Note   CSN: [id number] Arrival date & time: 01/22/20  1233     History Chief Complaint  Patient presents with  . Weakness    Oscar [name] is a 86 y.o. male past medical history of left bundle branch block, irregular heartbeat (parox Atrial tachycardia), mild nonobstructive CAD on Oct 2020 cath, implanted loop recorder, presenting to the ED with weakness and lightheadedness.  Is been a recurring problem for the patient for weeks 3 months.  He feels it is getting worse lately.  Describes sporadic episodes of feeling lightheaded at times, feeling fatigued, feeling short of breath.  These can occur at rest but also with activity.  It is not persistent with activity.  He denies any palpitations or chest pain.  He checked his blood pressure at home today during 1 of these episodes and noted that it was higher than normal at 150/90's.  Normally he is [id number] systolic he tells me.    He has a cardiology appointment scheduled in 2 days on Monday.  He takes 1/2 tablet of metoprolol 25 mg every morning  He last saw Dr Bettina Gavia cardiology on 01/07/20, and per office note was having similar symptoms at that time with weakness and near syncope.  Per Dr Joya Gaskins note:  "He went to the mail felt very weak almost fainted came in the house and recovered in a few minutes.  No recurrence but he is quite unsettled.  I was going check routine labs but he tells me they just had extensive lab work done through [employer] and is awaiting results.  He has had a previous implanted loop recorder he is worn external monitors he has had no clinical recurrence of arrhythmia.  He asked me what he can do in the future and I encouraged him to purchase a blood pressure cuff at home start checking his blood pressure daily and if he has an episode record the blood pressure and heart rate with it and to purchase the iPhone adapter to capture cardiac events.  He will come back  and see Dr. Jorene Minors in 6 weeks and hopefully his lab work will be available.  He is not having chest pain exertional shortness of breath and did not lose consciousness."  HPI     Past Medical History:  Diagnosis Date  . Bladder tumor   . Cervical stenosis of spine   . E. coli infection   . GERD (gastroesophageal reflux disease)   . History of appendectomy   . History of concussion    X2   NO RESIDUALS  . History of peptic ulcer 1970'S  . Hyperlipidemia   . Irregular heart beat   . LBBB (left bundle branch block)   . Status post placement of implantable loop recorder 11/08/2014    Patient Active Problem List   Diagnosis Date Noted  . Paroxysmal atrial fibrillation ([place]) 08/05/2019  . Coronary artery disease involving native coronary artery of native heart without angina pectoris 08/05/2019  . Mixed hyperlipidemia 08/05/2019  . Abnormal nuclear stress test   . Exertional dyspnea   . 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 Cervical myelopathy ([place]) 01/27/2018  . Status post placement of implantable loop recorder 11/08/2014  . Near syncope 05/16/2014  . Cancer of bladder ([place]) 02/10/2013    Past Surgical History:  Procedure Laterality Date  . ANTERIOR CERVICAL DECOMP/DISCECTOMY FUSION N/A 01/27/2018   Procedure: Anterior Cervical Discectomy Fusion - Cervical Four- Cervical Five;  Surgeon: Earnie Larsson, MD;  Location: [place];  Service: Neurosurgery;  Laterality: N/A;  Anterior Cervical Discectomy Fusion - Cervical Four- Cervical Five  . APPENDECTOMY  1970  . CARDIOVASCULAR STRESS TEST  03/22/09  . CYSTOSCOPY WITH BIOPSY N/A 02/10/2013   Procedure: CYSTOSCOPY WITH COLD CUP BIOPSY/FULGERATION;  Surgeon: Bernestine Amass, MD;  Location: [facility];  Service: Urology;  Laterality: N/A;  ALSO FULGERATION   . INGUINAL HERNIA REPAIR Right 07-14-2002  . LEFT HEART CATH AND CORONARY ANGIOGRAPHY N/A 07/13/2019   Procedure: LEFT HEART CATH AND CORONARY  ANGIOGRAPHY;  Surgeon: Troy Sine, MD;  Location: [place] [facility];  Service: Cardiovascular;  Laterality: N/A;  . LOOP RECORDER IMPLANT N/A 05/24/2014   Procedure: LOOP RECORDER IMPLANT;  Surgeon: Sanda Klein, MD;  Location: [facility];  Service: Cardiovascular;  Laterality: N/A;  . NECK SURGERY    . TONSILLECTOMY    . TRANSURETHRAL RESECTION OF BLADDER TUMOR  03-10-2008       Family History  Problem Relation Age of Onset  . Heart failure Mother   . Stroke Father   . Diabetes Brother   . Heart failure Brother     Social History   Tobacco Use  . Smoking status: Former Smoker    Packs/day: 0.50    Years: 30.00    Pack years: 15.00    Types: Cigarettes    Quit date: 02/06/1979    Years since quitting: 40.9  . Smokeless tobacco: Never Used  Substance Use Topics  . Alcohol use: Yes    Alcohol/week: 0.0 standard drinks    Comment: 1 glass of beer once or twice a month  . Drug use: No    Home Medications Prior to Admission medications   Medication Sig Start Date End Date Taking? Authorizing Provider  aspirin EC 81 MG tablet Take 81 mg by mouth daily.    [provider]  furosemide (LASIX) 40 MG tablet Take 1 tablet (40 mg) 3 times a week on Tuesdays, Thursdays and Saturdays 07/01/19   Tobb, Kardie, DO  Glucosamine-Chondroit-Vit C-Mn (GLUCOSAMINE 1500 COMPLEX PO) Take 1 capsule by mouth daily.    [provider]  MAGNESIUM PO Take 250 mg by mouth daily.     [provider]  metoprolol succinate (TOPROL-XL) 25 MG 24 hr tablet Take 1/2 (one-half) tablet by mouth once daily 01/11/20   Tobb, Kardie, DO  Olopatadine HCl 0.2 % SOLN Place 1 drop into both eyes as needed (Dry eye).  03/01/19   [provider]  omeprazole (PRILOSEC) 20 MG capsule Take 20 mg by mouth every morning.    [provider]  Potassium 99 MG TABS Take 198 mg by mouth daily.     [provider]  TIROSINT 25 MCG CAPS Take 1 capsule by mouth 3 (three) times  a week. 08/03/19   [provider]    Allergies    Patient has no known allergies.  Review of Systems   Review of Systems  Constitutional: Positive for fatigue. Negative for chills and fever.  Eyes: Negative for pain and visual disturbance.  Respiratory: Positive for shortness of breath. Negative for cough.   Cardiovascular: Negative for chest pain and palpitations.  Gastrointestinal: Negative for abdominal pain and vomiting.  Genitourinary: Negative for dysuria and hematuria.  Musculoskeletal: Negative for arthralgias and myalgias.  Skin: Negative for color change and rash.  Neurological: Positive for light-headedness. Negative for dizziness and syncope.  All other systems reviewed and  are negative.   Physical Exam Updated Vital Signs BP (!) 143/77   Pulse (!) 53   Temp 98.1 F (36.7 C) (Oral)   Resp (!) 22   Ht 5\' 7"  (1.702 m)   Wt 72.6 kg   SpO2 98%   BMI 25.06 kg/m   Physical Exam Vitals and nursing note reviewed.  Constitutional:      Appearance: He is well-developed.  HENT:     Head: Normocephalic and atraumatic.  Eyes:     Conjunctiva/sclera: Conjunctivae normal.  Cardiovascular:     Rate and Rhythm: Normal rate and regular rhythm.     Pulses: Normal pulses.  Pulmonary:     Effort: Pulmonary effort is normal. No respiratory distress.     Breath sounds: Normal breath sounds.  Abdominal:     Palpations: Abdomen is soft.     Tenderness: There is no abdominal tenderness.  Musculoskeletal:     Cervical back: Neck supple.  Skin:    General: Skin is warm and dry.     Comments: Multiple nevi  Neurological:     General: No focal deficit present.     Mental Status: He is alert and oriented to person, place, and time.     Gait: Gait normal.  Psychiatric:        Mood and Affect: Mood normal.        Behavior: Behavior normal.     ED Results / Procedures / Treatments   Labs (all labs ordered are listed, but only abnormal results are  displayed) Labs Reviewed  CBC  TSH  I-STAT CHEM 8, ED  TROPONIN I (HIGH SENSITIVITY)    EKG EKG Interpretation  Date/Time:  Saturday Jan 22 2020 12:46:54 EDT Ventricular Rate:  53 PR Interval:    QRS Duration: 140 QT Interval:  477 QTC Calculation: 448 R Axis:   1 Text Interpretation: Sinus rhythm Prolonged PR interval Left bundle branch block No sig change from Oct 2019 ecg No STEMI Confirmed by Octaviano Glow [phone]) on 01/22/2020 1:11:04 PM   Radiology No results found.  Procedures Procedures (including critical care time)  Medications Ordered in ED Medications - No data to display  ED Course  I have reviewed the triage vital signs and the nursing notes.  Pertinent labs & imaging results that were available during my care of the patient were reviewed by me and considered in my medical decision making (see chart for details).  86 yo male presenting to ED with weakness, lightheadedness, near-syncope  This involves an extensive number of treatment options, and is a complaint that carries with it a high risk of complications and morbidity.  The differential diagnosis includes arrythmia vs dehydration vs anemia  vs other  This has been ongoing for several weeks, and he has had similar complaints dating back months per his chart review.  He's had a loop recorder in the past.  He had a fairly unremarkable cath only 6 months ago on Oct 2020 as well as echocardiogram with preserved EF at that time.  I'm doubtful of ACS based on this.  No signs or symptoms of infection.  He received both covid vaccines  Less likely PE with no acute respiratory complaints  I ordered, reviewed, and interpreted labs, which included BMP, CBC, Trop Previous records obtained and reviewed showing outpatient cardiac w/u including cath and echo, outpatient cardiology notes   Clinical Course as of Jan 21 1630  Sat Jan 22, 2020  1512 On reassessment he is asymptomatic.  Unremarkable w/u.  Will  discharge with cardiology f/u in 2 days.  TSH is still pending, but I am doubtful this is thyroid storm or an emergent condition   [MT]    Clinical Course User Index [MT] Burns Timson, Carola Rhine, MD    Final Clinical Impression(s) / ED Diagnoses Final diagnoses:  Weakness    Rx / DC Orders ED Discharge Orders    None       Wyvonnia Dusky, MD 01/22/20 [phone]

## 2020-01-22 NOTE — ED Triage Notes (Signed)
States "I feel nervous inside and very shaky" with generalized weakness. Also concerned about high BP readings at home. Denies pain

## 2020-01-24 ENCOUNTER — Other Ambulatory Visit: Payer: Self-pay

## 2020-01-24 ENCOUNTER — Encounter: Payer: Self-pay | Admitting: Cardiology

## 2020-01-24 ENCOUNTER — Ambulatory Visit (INDEPENDENT_AMBULATORY_CARE_PROVIDER_SITE_OTHER): Payer: Medicare Other | Admitting: Cardiology

## 2020-01-24 VITALS — BP 118/70 | HR 64 | Ht 67.0 in | Wt 160.0 lb

## 2020-01-24 DIAGNOSIS — I4719 Other supraventricular tachycardia: Secondary | ICD-10-CM

## 2020-01-24 DIAGNOSIS — I471 Supraventricular tachycardia: Secondary | ICD-10-CM

## 2020-01-24 DIAGNOSIS — E782 Mixed hyperlipidemia: Secondary | ICD-10-CM | POA: Diagnosis not present

## 2020-01-24 DIAGNOSIS — I2510 Atherosclerotic heart disease of native coronary artery without angina pectoris: Secondary | ICD-10-CM | POA: Diagnosis not present

## 2020-01-24 HISTORY — DX: Supraventricular tachycardia: I47.1

## 2020-01-24 HISTORY — DX: Other supraventricular tachycardia: I47.19

## 2020-01-24 NOTE — Progress Notes (Signed)
Cardiology Office Note:    Date:  01/24/2020   ID:  Oscar [name], DOB 03-29-1931, MRN [id number]  PCP:  Berkley Harvey, NP  Cardiologist:  Jenean Lindau, MD   Referring MD: Berkley Harvey, NP    ASSESSMENT:    1. Coronary artery disease involving native coronary artery of native heart without angina pectoris   2. Mixed hyperlipidemia   3. Atrial tachycardia ([place])    PLAN:    In order of problems listed above:  1. Secondary prevention stressed with the patient.  Importance of compliance with diet medication stressed and he vocalized understanding. 2. Supraventricular episodes on event monitoring: This was a 3-day monitoring.  Patient symptoms are concerning.  He has an abnormal EKG with first-degree AV block and left bundle branch block.  I agree with my partner Dr. Bettina Gavia who recommended a event monitor.  Patient is agreeable.  We placed 1 month event monitor on this gentleman.  Further recommendations will be made based on the findings of the test.  I mentioned to him that he would see Dr.Tobb in a month but he insisted to see me for the follow-up of this monitoring.  I will oblige and see him in a month.  Further recommendations will be made at that time.  Again he has not had any syncopal episodes.  I reviewed emergency room records including lab work and EKG extensively.  Questions were answered to his satisfaction. 3. Coronary artery disease: Nonobstructive in nature coronary angiography report was reviewed and discussed with patient 4. Also the monitoring results from previous study done recently was reviewed with him.  Questions were answered to satisfaction.   Medication Adjustments/Labs and Tests Ordered: Current medicines are reviewed at length with the patient today.  Concerns regarding medicines are outlined above.  No orders of the defined types were placed in this encounter.  No orders of the defined types were placed in this encounter.    No chief complaint on  file.    History of Present Illness:    Oscar [name] is a 86 y.o. male.  Patient has history of atrial tachycardia and hyperlipoidemia.  He has had implantable loop recorder placed in the remote past.  He said recent coronary angiography with nonobstructive disease.  He denies any problems at this time.  He went to the emergency room recently.  He complains of weakness in his legs.  No palpitations no syncope.  He mentions to me that he brought a blood pressure machine and this shows his heart rate to be irregular.  He does not provide any details.  At the time of my evaluation, the patient is alert awake oriented and in no distress.  Past Medical History:  Diagnosis Date  . Bladder tumor   . Cervical stenosis of spine   . E. coli infection   . GERD (gastroesophageal reflux disease)   . History of appendectomy   . History of concussion    X2   NO RESIDUALS  . History of peptic ulcer 1970'S  . Hyperlipidemia   . Irregular heart beat   . LBBB (left bundle branch block)   . Status post placement of implantable loop recorder 11/08/2014    Past Surgical History:  Procedure Laterality Date  . ANTERIOR CERVICAL DECOMP/DISCECTOMY FUSION N/A 01/27/2018   Procedure: Anterior Cervical Discectomy Fusion - Cervical Four- Cervical Five;  Surgeon: Earnie Larsson, MD;  Location: [place];  Service: Neurosurgery;  Laterality: N/A;  Anterior Cervical Discectomy  Fusion - Cervical Four- Cervical Five  . APPENDECTOMY  1970  . CARDIOVASCULAR STRESS TEST  03/22/09  . CYSTOSCOPY WITH BIOPSY N/A 02/10/2013   Procedure: CYSTOSCOPY WITH COLD CUP BIOPSY/FULGERATION;  Surgeon: Bernestine Amass, MD;  Location: [facility];  Service: Urology;  Laterality: N/A;  ALSO FULGERATION   . INGUINAL HERNIA REPAIR Right 07-14-2002  . LEFT HEART CATH AND CORONARY ANGIOGRAPHY N/A 07/13/2019   Procedure: LEFT HEART CATH AND CORONARY ANGIOGRAPHY;  Surgeon: Troy Sine, MD;  Location: [place] [facility];  Service:  Cardiovascular;  Laterality: N/A;  . LOOP RECORDER IMPLANT N/A 05/24/2014   Procedure: LOOP RECORDER IMPLANT;  Surgeon: Sanda Klein, MD;  Location: [place] [facility];  Service: Cardiovascular;  Laterality: N/A;  . NECK SURGERY    . TONSILLECTOMY    . TRANSURETHRAL RESECTION OF BLADDER TUMOR  03-10-2008    Current Medications: Current Meds  Medication Sig  . aspirin EC 81 MG tablet Take 81 mg by mouth daily.  . furosemide (LASIX) 40 MG tablet Take 1 tablet (40 mg) 3 times a week on Tuesdays, Thursdays and Saturdays  . Glucosamine-Chondroit-Vit C-Mn (GLUCOSAMINE 1500 COMPLEX PO) Take 1 capsule by mouth daily.  Marland Kitchen MAGNESIUM PO Take 250 mg by mouth daily.   . metoprolol succinate (TOPROL-XL) 25 MG 24 hr tablet Take 1/2 (one-half) tablet by mouth once daily  . Olopatadine HCl 0.2 % SOLN Place 1 drop into both eyes as needed (Dry eye).   Marland Kitchen omeprazole (PRILOSEC) 20 MG capsule Take 20 mg by mouth every morning.  . Potassium 99 MG TABS Take 198 mg by mouth daily.   Marland Kitchen TIROSINT 25 MCG CAPS Take 1 capsule by mouth 3 (three) times a week.     Allergies:   Patient has no known allergies.   Social History   Socioeconomic History  . Marital status: Married    Spouse name: Not on file  . Number of children: Not on file  . Years of education: Not on file  . Highest education level: Not on file  Occupational History  . Not on file  Tobacco Use  . Smoking status: Former Smoker    Packs/day: 0.50    Years: 30.00    Pack years: 15.00    Types: Cigarettes    Quit date: 02/06/1979    Years since quitting: 40.9  . Smokeless tobacco: Never Used  Substance and Sexual Activity  . Alcohol use: Yes    Alcohol/week: 0.0 standard drinks    Comment: 1 glass of beer once or twice a month  . Drug use: No  . Sexual activity: Not on file  Other Topics Concern  . Not on file  Social History Narrative  . Not on file   Social Determinants of Health   Financial Resource Strain:   . Difficulty of Paying  Living Expenses:   Food Insecurity:   . Worried About Charity fundraiser in the Last Year:   . Arboriculturist in the Last Year:   Transportation Needs:   . Film/video editor (Medical):   Marland Kitchen Lack of Transportation (Non-Medical):   Physical Activity:   . Days of Exercise per Week:   . Minutes of Exercise per Session:   Stress:   . Feeling of Stress :   Social Connections:   . Frequency of Communication with Friends and Family:   . Frequency of Social Gatherings with Friends and Family:   . Attends Religious Services:   .  Active Member of Clubs or Organizations:   . Attends Archivist Meetings:   Marland Kitchen Marital Status:      Family History: The patient's family history includes Diabetes in his brother; Heart failure in his brother and mother; Stroke in his father.  ROS:   Please see the history of present illness.    All other systems reviewed and are negative.  EKGs/Labs/Other Studies Reviewed:    The following studies were reviewed today: I reviewed EKG and this revealed sinus rhythm first degree AV block and left bundle branch block and nonspecific ST-T changes.  Emergency room records were reviewed.   Recent Labs: 06/29/2019: Magnesium 2.1 01/22/2020: BUN 14; Creatinine, Ser 1.00; Hemoglobin 13.3; Platelets 237; Potassium 4.0; Sodium 137; TSH 2.137  Recent Lipid Panel No results found for: CHOL, TRIG, HDL, CHOLHDL, VLDL, LDLCALC, LDLDIRECT  Physical Exam:    VS:  BP 118/70   Pulse 64   Ht 5\' 7"  (1.702 m)   Wt 160 lb (72.6 kg)   SpO2 99%   BMI 25.06 kg/m     Wt Readings from Last 3 Encounters:  01/24/20 160 lb (72.6 kg)  01/22/20 160 lb (72.6 kg)  01/07/20 162 lb (73.5 kg)     GEN: Patient is in no acute distress HEENT: Normal NECK: No JVD; No carotid bruits LYMPHATICS: No lymphadenopathy CARDIAC: Hear sounds regular, 2/6 systolic murmur at the apex. RESPIRATORY:  Clear to auscultation without rales, wheezing or rhonchi  ABDOMEN: Soft, non-tender,  non-distended MUSCULOSKELETAL:  No edema; No deformity  SKIN: Warm and dry NEUROLOGIC:  Alert and oriented x 3 PSYCHIATRIC:  Normal affect   Signed, Jenean Lindau, MD  01/24/2020 3:06 PM    [facility]

## 2020-01-24 NOTE — Patient Instructions (Signed)
Medication Instructions:  No medication changes. *If you need a refill on your cardiac medications before your next appointment, please call your pharmacy*   Lab Work: None ordered If you have labs (blood work) drawn today and your tests are completely normal, you will receive your results only by: Marland Kitchen MyChart Message (if you have MyChart) OR . A paper copy in the mail If you have any lab test that is abnormal or we need to change your treatment, we will call you to review the results.   Testing/Procedures: Your physician has recommended that you wear an event monitor. Event monitors are medical devices that record the heart's electrical activity. Doctors most often [place] these monitors to diagnose arrhythmias. Arrhythmias are problems with the speed or rhythm of the heartbeat. The monitor is a small, portable device. You can wear one while you do your normal daily activities. This is usually used to diagnose what is causing palpitations/syncope (passing out).  You will wear the monitor for 1 month.   Follow-Up: At [facility], you and your health needs are our priority.  As part of our continuing mission to provide you with exceptional heart care, we have created designated Provider Care Teams.  These Care Teams include your primary Cardiologist (physician) and Advanced Practice Providers (APPs -  Physician Assistants and Nurse Practitioners) who all work together to provide you with the care you need, when you need it.  We recommend signing up for the patient portal called "MyChart".  Sign up information is provided on this After Visit Summary.  MyChart is used to connect with patients for Virtual Visits (Telemedicine).  Patients are able to view lab/test results, encounter notes, upcoming appointments, etc.  Non-urgent messages can be sent to your provider as well.   To learn more about what you can do with MyChart, go to [email or web address].    Your next appointment:   1  month(s)  The format for your next appointment:   In Person  Provider:   Jyl Heinz, MD   Other Instructions NA

## 2020-01-29 ENCOUNTER — Emergency Department (HOSPITAL_BASED_OUTPATIENT_CLINIC_OR_DEPARTMENT_OTHER)
Admission: EM | Admit: 2020-01-29 | Discharge: 2020-01-29 | Disposition: A | Discharge status: Home / Self Care | Payer: Medicare Other | Attending: Emergency Medicine | Admitting: Emergency Medicine

## 2020-01-29 ENCOUNTER — Encounter (HOSPITAL_BASED_OUTPATIENT_CLINIC_OR_DEPARTMENT_OTHER): Payer: Self-pay | Admitting: Emergency Medicine

## 2020-01-29 ENCOUNTER — Other Ambulatory Visit: Payer: Self-pay

## 2020-01-29 DIAGNOSIS — Z87891 Personal history of nicotine dependence: Secondary | ICD-10-CM | POA: Diagnosis not present

## 2020-01-29 DIAGNOSIS — R531 Weakness: Secondary | ICD-10-CM | POA: Diagnosis not present

## 2020-01-29 DIAGNOSIS — Z79899 Other long term (current) drug therapy: Secondary | ICD-10-CM | POA: Diagnosis not present

## 2020-01-29 DIAGNOSIS — R001 Bradycardia, unspecified: Secondary | ICD-10-CM

## 2020-01-29 DIAGNOSIS — Z7982 Long term (current) use of aspirin: Secondary | ICD-10-CM | POA: Insufficient documentation

## 2020-01-29 LAB — CBC WITH DIFFERENTIAL/PLATELET
Abs Immature Granulocytes: 0.01 10*3/uL (ref 0.00–0.07)
Basophils Absolute: 0 10*3/uL (ref 0.0–0.1)
Basophils Relative: 1 %
Eosinophils Absolute: 0.3 10*3/uL (ref 0.0–0.5)
Eosinophils Relative: 5 %
HCT: 39.3 % (ref 39.0–52.0)
Hemoglobin: 12.8 g/dL — ABNORMAL LOW (ref 13.0–17.0)
Immature Granulocytes: 0 %
Lymphocytes Relative: 17 %
Lymphs Abs: 0.9 10*3/uL (ref 0.7–4.0)
MCH: 31.4 pg (ref 26.0–34.0)
MCHC: 32.6 g/dL (ref 30.0–36.0)
MCV: 96.6 fL (ref 80.0–100.0)
Monocytes Absolute: 0.5 10*3/uL (ref 0.1–1.0)
Monocytes Relative: 10 %
Neutro Abs: 3.3 10*3/uL (ref 1.7–7.7)
Neutrophils Relative %: 67 %
Platelets: 180 10*3/uL (ref 150–400)
RBC: 4.07 MIL/uL — ABNORMAL LOW (ref 4.22–5.81)
RDW: 13 % (ref 11.5–15.5)
WBC: 4.9 10*3/uL (ref 4.0–10.5)
nRBC: 0 % (ref 0.0–0.2)

## 2020-01-29 LAB — BASIC METABOLIC PANEL
Anion gap: 8 (ref 5–15)
BUN: 14 mg/dL (ref 8–23)
CO2: 24 mmol/L (ref 22–32)
Calcium: 8.9 mg/dL (ref 8.9–10.3)
Chloride: 105 mmol/L (ref 98–111)
Creatinine, Ser: 1.03 mg/dL (ref 0.61–1.24)
GFR calc Af Amer: >60 mL/min (ref >60)
GFR calc non Af Amer: >60 mL/min (ref >60)
Glucose, Bld: 84 mg/dL (ref 70–99)
Potassium: 4.3 mmol/L (ref 3.5–5.1)
Sodium: 137 mmol/L (ref 135–145)

## 2020-01-29 LAB — URINALYSIS, ROUTINE W REFLEX MICROSCOPIC
Bilirubin Urine: NEGATIVE
Glucose, UA: NEGATIVE mg/dL
Hgb urine dipstick: NEGATIVE
Ketones, ur: NEGATIVE mg/dL
Leukocytes,Ua: NEGATIVE
Nitrite: NEGATIVE
Protein, ur: NEGATIVE mg/dL
Specific Gravity, Urine: 1.01 (ref 1.005–1.030)
pH: 7 (ref 5.0–8.0)

## 2020-01-29 NOTE — ED Notes (Signed)
ED Provider at bedside. 

## 2020-01-29 NOTE — ED Notes (Signed)
Ambulated without difficulty, no distress noted, no increased WOB.

## 2020-01-29 NOTE — ED Provider Notes (Signed)
[place] EMERGENCY DEPARTMENT Provider Note  CSN: [id number] Arrival date & time: 01/29/20 1115    History Chief Complaint  Patient presents with  . Weakness    HPI  Oscar [name] is a 86 y.o. male presents for evaluation of general weakness and feeling lightheaded. He has had several recent evaluations for same including ED visit on 5/1, cardiology visit on 4/16 and PCP visits on 4/20 and 4/27. There is also a note from the Cardiologist on 5/3 indicated the patient had been fitted for a continuous home heart monitor but patient states there was a missing part that just arrived yesterday and he has not started wearing that monitor yet. He reports this morning he was using his home BP cuff which was flashing an alert regarding his heart but no other indication of what the underlying issue was. He reports a normal BP and HR in the 50s at the time which is not unusual for him. The patient reports he did not get much sleep due to frequent need to urinate but that is also not unusual for him.He called EMS to bring him to the ED this morning and reports he feels fine as long as he is lying in bed. Not orthostatic per EMS.    Past Medical History:  Diagnosis Date  . Bladder tumor   . Cervical stenosis of spine   . E. coli infection   . GERD (gastroesophageal reflux disease)   . History of appendectomy   . History of concussion    X2   NO RESIDUALS  . History of peptic ulcer 1970'S  . Hyperlipidemia   . Irregular heart beat   . LBBB (left bundle branch block)   . Status post placement of implantable loop recorder 11/08/2014    Past Surgical History:  Procedure Laterality Date  . ANTERIOR CERVICAL DECOMP/DISCECTOMY FUSION N/A 01/27/2018   Procedure: Anterior Cervical Discectomy Fusion - Cervical Four- Cervical Five;  Surgeon: Earnie Larsson, MD;  Location: [place];  Service: Neurosurgery;  Laterality: N/A;  Anterior Cervical Discectomy Fusion - Cervical Four- Cervical Five  .  APPENDECTOMY  1970  . CARDIOVASCULAR STRESS TEST  03/22/09  . CYSTOSCOPY WITH BIOPSY N/A 02/10/2013   Procedure: CYSTOSCOPY WITH COLD CUP BIOPSY/FULGERATION;  Surgeon: Bernestine Amass, MD;  [facility];  Service: Urology;  Laterality: N/A;  ALSO FULGERATION   . INGUINAL HERNIA REPAIR Right 07-14-2002  . LEFT HEART CATH AND CORONARY ANGIOGRAPHY N/A 07/13/2019   Procedure: LEFT HEART CATH AND CORONARY ANGIOGRAPHY;  Surgeon: Troy Sine, MD;  Location: [place] [facility];  Service: Cardiovascular;  Laterality: N/A;  . LOOP RECORDER IMPLANT N/A 05/24/2014   Procedure: LOOP RECORDER IMPLANT;  Surgeon: Sanda Klein, MD;  Location: [place] CATH [facility];  Service: Cardiovascular;  Laterality: N/A;  . NECK SURGERY    . TONSILLECTOMY    . TRANSURETHRAL RESECTION OF BLADDER TUMOR  03-10-2008    Family History  Problem Relation Age of Onset  . Heart failure Mother   . Stroke Father   . Diabetes Brother   . Heart failure Brother     Social History   Tobacco Use  . Smoking status: Former Smoker    Packs/day: 0.50    Years: 30.00    Pack years: 15.00    Types: Cigarettes    Quit date: 02/06/1979    Years since quitting: 41.0  . Smokeless tobacco: Never Used  Substance Use Topics  . Alcohol use: Yes  Alcohol/week: 0.0 standard drinks    Comment: 1 glass of beer once or twice a month  . Drug use: No     Home Medications Prior to Admission medications   Medication Sig Start Date End Date Taking? Authorizing Provider  aspirin EC 81 MG tablet Take 81 mg by mouth daily.    [provider]  furosemide (LASIX) 40 MG tablet Take 1 tablet (40 mg) 3 times a week on Tuesdays, Thursdays and Saturdays 07/01/19   Tobb, Kardie, DO  Glucosamine-Chondroit-Vit C-Mn (GLUCOSAMINE 1500 COMPLEX PO) Take 1 capsule by mouth daily.    [provider]  MAGNESIUM PO Take 250 mg by mouth daily.     [provider]  metoprolol succinate (TOPROL-XL) 25 MG 24 hr tablet  Take 1/2 (one-half) tablet by mouth once daily 01/11/20   Tobb, Kardie, DO  Olopatadine HCl 0.2 % SOLN Place 1 drop into both eyes as needed (Dry eye).  03/01/19   [provider]  omeprazole (PRILOSEC) 20 MG capsule Take 20 mg by mouth every morning.    [provider]  Potassium 99 MG TABS Take 198 mg by mouth daily.     [provider]  TIROSINT 25 MCG CAPS Take 1 capsule by mouth 3 (three) times a week. 08/03/19   [provider]     Allergies    Patient has no known allergies.   Review of Systems   Review of Systems  Constitutional: Negative for fever.  HENT: Negative for congestion and sore throat.   Respiratory: Negative for cough and shortness of breath.   Cardiovascular: Negative for chest pain.  Gastrointestinal: Negative for abdominal pain, diarrhea, nausea and vomiting.  Genitourinary: Negative for dysuria.  Musculoskeletal: Negative for myalgias.  Skin: Negative for rash.  Neurological: Positive for dizziness and light-headedness. Negative for headaches.  Psychiatric/Behavioral: Negative for behavioral problems.     Physical Exam BP (!) 163/92 (BP Location: Right Arm)   Pulse (!) 57   Temp 97.9 F (36.6 C) (Oral)   Resp 14   Ht 5\' 7"  (1.702 m)   Wt 70.3 kg   SpO2 100%   BMI 24.28 kg/m   Physical Exam Vitals and nursing note reviewed.  Constitutional:      Appearance: Normal appearance.  HENT:     Head: Normocephalic and atraumatic.     Nose: Nose normal.     Mouth/Throat:     Mouth: Mucous membranes are moist.  Eyes:     Extraocular Movements: Extraocular movements intact.     Conjunctiva/sclera: Conjunctivae normal.  Cardiovascular:     Rate and Rhythm: Normal rate.  Pulmonary:     Effort: Pulmonary effort is normal.     Breath sounds: Normal breath sounds.  Abdominal:     General: Abdomen is flat.     Palpations: Abdomen is soft.     Tenderness: There is no abdominal tenderness.  Musculoskeletal:         General: No swelling. Normal range of motion.     Cervical back: Neck supple.  Skin:    General: Skin is warm and dry.  Neurological:     General: No focal deficit present.     Mental Status: He is alert.  Psychiatric:        Mood and Affect: Mood normal.      ED Results / Procedures / Treatments   Labs (all labs ordered are listed, but only abnormal results are displayed) Labs Reviewed  CBC WITH DIFFERENTIAL/PLATELET -  Abnormal; Notable for the following components:      Result Value   RBC 4.07 (*)    Hemoglobin 12.8 (*)    All other components within normal limits  BASIC METABOLIC PANEL  URINALYSIS, ROUTINE W REFLEX MICROSCOPIC    EKG EKG Interpretation  Date/Time:  Saturday Jan 29 2020 11:21:06 EDT Ventricular Rate:  54 PR Interval:    QRS Duration: 141 QT Interval:  469 QTC Calculation: 445 R Axis:   10 Text Interpretation: Sinus rhythm Prolonged PR interval Left bundle branch block No significant change since last tracing Confirmed by Calvert Cantor [phone]) on 01/29/2020 12:32:22 PM    Radiology No results found.  Procedures Procedures  Medications Ordered in the ED Medications - No data to display   ED Course  I have reviewed the triage vital signs and the nursing notes.  Pertinent labs & imaging results that were available during my care of the patient were reviewed by me and considered in my medical decision making (see chart for details).  Clinical Course as of Jan 29 1952  Sat Jan 29, 2020  1420 Pt's HR has been variable, mostly in the 50-70 range which patient says is his baseline.    [CS]  1505 Patient continues to feel well. Labs unremarkable. Ambulates with rapid steady gait without symptoms. Wife at bedside confirms he saw cardiology but that the provider he saw was not the one listed on his chart. He received what sounds like a Zio patch in the mail to his house 2 days ago (was not applied at the clinic as the cardiology not mentions) but  that the double sided tape was not included so he was unable to apply it when it arrived. He tried to hold it on his chest today when he was having symptoms but EMS arrived before he could get the device working and linked to the transmitter. Regardless he feels well now and would like to go home. The missing tape has been shipped to his house and he will apply his monitor when it arrives. RTED for any other concerns.    [CS]    Clinical Course User Index [CS] Truddie Hidden, MD    MDM Rules/Calculators/A&P MDM  Final Clinical Impression(s) / ED Diagnoses Final diagnoses:  Generalized weakness  Bradycardia    Rx / DC Orders ED Discharge Orders    None       Truddie Hidden, MD 01/29/20 [phone]

## 2020-01-29 NOTE — ED Triage Notes (Signed)
Pt c/o "feeling lightheaded", c/o not sleeping well last night. Pt reports hx of afib. Pt denies CP. Denies sob. Pt also endorses concern with b/p at home being cause of dizziness.

## 2020-01-29 NOTE — ED Triage Notes (Signed)
Pt arrives EMS c/o weakness and dizziness 1 hr pta. Pt AOx4, ambulatory. Per EMS, pt exhibits no s/s of distress, no complaints of CP, sob

## 2020-02-01 ENCOUNTER — Ambulatory Visit (INDEPENDENT_AMBULATORY_CARE_PROVIDER_SITE_OTHER): Payer: Medicare Other

## 2020-02-01 DIAGNOSIS — I471 Supraventricular tachycardia: Secondary | ICD-10-CM | POA: Diagnosis not present

## 2020-02-12 ENCOUNTER — Other Ambulatory Visit: Payer: Self-pay

## 2020-02-12 ENCOUNTER — Emergency Department (HOSPITAL_BASED_OUTPATIENT_CLINIC_OR_DEPARTMENT_OTHER): Payer: Medicare Other

## 2020-02-12 ENCOUNTER — Encounter (HOSPITAL_BASED_OUTPATIENT_CLINIC_OR_DEPARTMENT_OTHER): Payer: Self-pay | Admitting: Emergency Medicine

## 2020-02-12 ENCOUNTER — Emergency Department (HOSPITAL_BASED_OUTPATIENT_CLINIC_OR_DEPARTMENT_OTHER)
Admission: EM | Admit: 2020-02-12 | Discharge: 2020-02-12 | Disposition: A | Discharge status: Home / Self Care | Payer: Medicare Other | Attending: Emergency Medicine | Admitting: Emergency Medicine

## 2020-02-12 DIAGNOSIS — I2510 Atherosclerotic heart disease of native coronary artery without angina pectoris: Secondary | ICD-10-CM | POA: Insufficient documentation

## 2020-02-12 DIAGNOSIS — Z87891 Personal history of nicotine dependence: Secondary | ICD-10-CM | POA: Diagnosis not present

## 2020-02-12 DIAGNOSIS — Z7982 Long term (current) use of aspirin: Secondary | ICD-10-CM | POA: Insufficient documentation

## 2020-02-12 DIAGNOSIS — R531 Weakness: Secondary | ICD-10-CM | POA: Insufficient documentation

## 2020-02-12 DIAGNOSIS — R001 Bradycardia, unspecified: Secondary | ICD-10-CM | POA: Insufficient documentation

## 2020-02-12 DIAGNOSIS — Z79899 Other long term (current) drug therapy: Secondary | ICD-10-CM | POA: Insufficient documentation

## 2020-02-12 LAB — CBC WITH DIFFERENTIAL/PLATELET
Abs Immature Granulocytes: 0.05 10*3/uL (ref 0.00–0.07)
Basophils Absolute: 0 10*3/uL (ref 0.0–0.1)
Basophils Relative: 1 %
Eosinophils Absolute: 0.3 10*3/uL (ref 0.0–0.5)
Eosinophils Relative: 6 %
HCT: 41.8 % (ref 39.0–52.0)
Hemoglobin: 13.8 g/dL (ref 13.0–17.0)
Immature Granulocytes: 1 %
Lymphocytes Relative: 18 %
Lymphs Abs: 1 10*3/uL (ref 0.7–4.0)
MCH: 31.1 pg (ref 26.0–34.0)
MCHC: 33 g/dL (ref 30.0–36.0)
MCV: 94.1 fL (ref 80.0–100.0)
Monocytes Absolute: 0.5 10*3/uL (ref 0.1–1.0)
Monocytes Relative: 10 %
Neutro Abs: 3.6 10*3/uL (ref 1.7–7.7)
Neutrophils Relative %: 64 %
Platelets: 190 10*3/uL (ref 150–400)
RBC: 4.44 MIL/uL (ref 4.22–5.81)
RDW: 13 % (ref 11.5–15.5)
WBC: 5.5 10*3/uL (ref 4.0–10.5)
nRBC: 0 % (ref 0.0–0.2)

## 2020-02-12 LAB — URINALYSIS, ROUTINE W REFLEX MICROSCOPIC
Bilirubin Urine: NEGATIVE
Glucose, UA: NEGATIVE mg/dL
Hgb urine dipstick: NEGATIVE
Ketones, ur: NEGATIVE mg/dL
Leukocytes,Ua: NEGATIVE
Nitrite: NEGATIVE
Protein, ur: NEGATIVE mg/dL
Specific Gravity, Urine: 1.01 (ref 1.005–1.030)
pH: 7 (ref 5.0–8.0)

## 2020-02-12 LAB — COMPREHENSIVE METABOLIC PANEL
ALT: 17 U/L (ref 0–44)
AST: 18 U/L (ref 15–41)
Albumin: 4.2 g/dL (ref 3.5–5.0)
Alkaline Phosphatase: 53 U/L (ref 38–126)
Anion gap: 11 (ref 5–15)
BUN: 13 mg/dL (ref 8–23)
CO2: 25 mmol/L (ref 22–32)
Calcium: 9.1 mg/dL (ref 8.9–10.3)
Chloride: 103 mmol/L (ref 98–111)
Creatinine, Ser: 1.08 mg/dL (ref 0.61–1.24)
GFR calc Af Amer: >60 mL/min (ref >60)
GFR calc non Af Amer: >60 mL/min (ref >60)
Glucose, Bld: 89 mg/dL (ref 70–99)
Potassium: 4 mmol/L (ref 3.5–5.1)
Sodium: 139 mmol/L (ref 135–145)
Total Bilirubin: 0.7 mg/dL (ref 0.3–1.2)
Total Protein: 6.6 g/dL (ref 6.5–8.1)

## 2020-02-12 NOTE — ED Triage Notes (Signed)
Pt c/o dizziness and generalized weakness for over a week. Has been seen here several times. He followed up with cardiology and has been wearing a heart monitor. He denies chest pain

## 2020-02-12 NOTE — Discharge Instructions (Signed)
On Monday morning, please call your cardiology office to get a close follow-up appointment and discuss the symptoms that you are experiencing today. If you have any recurrent episodes, any episodes of passing out, chest pain, difficulty in breathing, please return to ER for reassessment.

## 2020-02-13 NOTE — ED Provider Notes (Signed)
[facility] EMERGENCY DEPARTMENT Provider Note   CSN: [id number] Arrival date & time: 02/12/20  1022     History Chief Complaint  Patient presents with  . Weakness    Oscar [name] is a 86 y.o. male.    Presents to ER with concern for generalized weakness.  States that he has had issues of feeling more weak over the past couple weeks.  Has gone to ER multiple times, cardiologist multiple times.  Has intermittent episodes where he feels generally weak, slightly dizzy.  No room spinning sensation.  Does not feel like he is going to pass out and has never passed out.  Last episode occurred this morning for around 30 minutes.  Felt like he was weak in the knees, did not fall.  Did not have chest pain or shortness of breath.  His cardiologist started him on a 30-day monitor which has not come back yet.  Patient currently does not have any symptoms.  Per chart review, CAD, last fall her monitor showed episodes of atrial tachycardia and patient was placed on low-dose beta-blocker.  Initial history obtained via chart review, review of cardiology notes, recent ER visits.  HPI     Past Medical History:  Diagnosis Date  . Bladder tumor   . Cervical stenosis of spine   . E. coli infection   . GERD (gastroesophageal reflux disease)   . History of appendectomy   . History of concussion    X2   NO RESIDUALS  . History of peptic ulcer 1970'S  . Hyperlipidemia   . Irregular heart beat   . LBBB (left bundle branch block)   . Status post placement of implantable loop recorder 11/08/2014    Patient Active Problem List   Diagnosis Date Noted  . Atrial tachycardia ([place]) 01/24/2020  . Paroxysmal atrial fibrillation ([place]) 08/05/2019  . Coronary artery disease involving native coronary artery of native heart without angina pectoris 08/05/2019  . Mixed hyperlipidemia 08/05/2019  . Abnormal nuclear stress test   . Exertional dyspnea   . Shiga toxin-producing Escherichia coli infection  09/03/2018  . Cervical myelopathy ([place]) 01/27/2018  . Status post placement of implantable loop recorder 11/08/2014  . Near syncope 05/16/2014  . Cancer of bladder ([place]) 02/10/2013    Past Surgical History:  Procedure Laterality Date  . ANTERIOR CERVICAL DECOMP/DISCECTOMY FUSION N/A 01/27/2018   Procedure: Anterior Cervical Discectomy Fusion - Cervical Four- Cervical Five;  Surgeon: Earnie Larsson, MD;  Location: [facility];  Service: Neurosurgery;  Laterality: N/A;  Anterior Cervical Discectomy Fusion - Cervical Four- Cervical Five  . APPENDECTOMY  1970  . CARDIOVASCULAR STRESS TEST  03/22/09  . CYSTOSCOPY WITH BIOPSY N/A 02/10/2013   Procedure: CYSTOSCOPY WITH COLD CUP BIOPSY/FULGERATION;  Surgeon: Bernestine Amass, MD;  Location: [facility];  Service: Urology;  Laterality: N/A;  ALSO FULGERATION   . INGUINAL HERNIA REPAIR Right 07-14-2002  . LEFT HEART CATH AND CORONARY ANGIOGRAPHY N/A 07/13/2019   Procedure: LEFT HEART CATH AND CORONARY ANGIOGRAPHY;  Surgeon: Troy Sine, MD;  Location: [facility];  Service: Cardiovascular;  Laterality: N/A;  . LOOP RECORDER IMPLANT N/A 05/24/2014   Procedure: LOOP RECORDER IMPLANT;  Surgeon: Sanda Klein, MD;  Location: [facility] CATH [facility];  Service: Cardiovascular;  Laterality: N/A;  . NECK SURGERY    . TONSILLECTOMY    . TRANSURETHRAL RESECTION OF BLADDER TUMOR  03-10-2008       Family History  Problem Relation Age of Onset  .  Heart failure Mother   . Stroke Father   . Diabetes Brother   . Heart failure Brother     Social History   Tobacco Use  . Smoking status: Former Smoker    Packs/day: 0.50    Years: 30.00    Pack years: 15.00    Types: Cigarettes    Quit date: 02/06/1979    Years since quitting: 41.0  . Smokeless tobacco: Never Used  Substance Use Topics  . Alcohol use: Yes    Alcohol/week: 0.0 standard drinks    Comment: 1 glass of beer once or twice a month  . Drug use: No    Home Medications Prior to  Admission medications   Medication Sig Start Date End Date Taking? Authorizing Provider  aspirin EC 81 MG tablet Take 81 mg by mouth daily.    [provider]  furosemide (LASIX) 40 MG tablet Take 1 tablet (40 mg) 3 times a week on Tuesdays, Thursdays and Saturdays 07/01/19   Tobb, Kardie, DO  Glucosamine-Chondroit-Vit C-Mn (GLUCOSAMINE 1500 COMPLEX PO) Take 1 capsule by mouth daily.    [provider]  MAGNESIUM PO Take 250 mg by mouth daily.     [provider]  metoprolol succinate (TOPROL-XL) 25 MG 24 hr tablet Take 1/2 (one-half) tablet by mouth once daily 01/11/20   Tobb, Kardie, DO  Olopatadine HCl 0.2 % SOLN Place 1 drop into both eyes as needed (Dry eye).  03/01/19   [provider]  omeprazole (PRILOSEC) 20 MG capsule Take 20 mg by mouth every morning.    [provider]  Potassium 99 MG TABS Take 198 mg by mouth daily.     [provider]  TIROSINT 25 MCG CAPS Take 1 capsule by mouth 3 (three) times a week. 08/03/19   [provider]    Allergies    Patient has no known allergies.  Review of Systems   Review of Systems  Constitutional: Positive for fatigue. Negative for chills and fever.  HENT: Negative for ear pain and sore throat.   Eyes: Negative for pain and visual disturbance.  Respiratory: Negative for cough and shortness of breath.   Cardiovascular: Negative for chest pain and palpitations.  Gastrointestinal: Negative for abdominal pain and vomiting.  Genitourinary: Negative for dysuria and hematuria.  Musculoskeletal: Negative for arthralgias and back pain.  Skin: Negative for color change and rash.  Neurological: Positive for dizziness. Negative for seizures and syncope.  All other systems reviewed and are negative.   Physical Exam Updated Vital Signs BP 125/70 (BP Location: Right Arm)   Pulse (!) 58   Temp 97.8 F (36.6 C)   Resp 16   Ht 5\' 7"  (1.702 m)   Wt 70.3 kg   SpO2 100%   BMI 24.28 kg/m     Physical Exam Vitals and nursing note reviewed.  Constitutional:      Appearance: He is well-developed.  HENT:     Head: Normocephalic and atraumatic.  Eyes:     Conjunctiva/sclera: Conjunctivae normal.  Cardiovascular:     Rate and Rhythm: Regular rhythm. Bradycardia present.     Heart sounds: No murmur.  Pulmonary:     Effort: Pulmonary effort is normal. No respiratory distress.     Breath sounds: Normal breath sounds.  Abdominal:     Palpations: Abdomen is soft.     Tenderness: There is no abdominal tenderness.  Musculoskeletal:        General: No deformity or signs of injury.  Cervical back: Neck supple.  Skin:    General: Skin is warm and dry.     Capillary Refill: Capillary refill takes less than 2 seconds.  Neurological:     General: No focal deficit present.     Mental Status: He is alert and oriented to person, place, and time.     ED Results / Procedures / Treatments   Labs (all labs ordered are listed, but only abnormal results are displayed) Labs Reviewed  URINALYSIS, ROUTINE W REFLEX MICROSCOPIC - Abnormal; Notable for the following components:      Result Value   Color, Urine STRAW (*)    All other components within normal limits  CBC WITH DIFFERENTIAL/PLATELET  COMPREHENSIVE METABOLIC PANEL    EKG EKG Interpretation  Date/Time:  Saturday Feb 12 2020 10:35:50 EDT Ventricular Rate:  54 PR Interval:    QRS Duration: 137 QT Interval:  486 QTC Calculation: 461 R Axis:   23 Text Interpretation: Sinus rhythm Prolonged PR interval Left bundle branch block Confirmed by Madalyn Rob [phone]) on 02/12/2020 12:32:03 PM   Radiology DG Chest 2 View  Result Date: 02/12/2020 CLINICAL DATA:  Weakness EXAM: CHEST - 2 VIEW COMPARISON:  07/08/2019 FINDINGS: Cardiac shadows within normal limits. Loop recorder is seen. No focal infiltrate or sizable effusion is noted. Degenerative changes of the thoracic spine are seen. IMPRESSION: No acute abnormality  noted. Electronically Signed   By: Inez Catalina M.D.   On: 02/12/2020 11:46    Procedures Procedures (including critical care time)  Medications Ordered in ED Medications - No data to display  ED Course  I have reviewed the triage vital signs and the nursing notes.  Pertinent labs & imaging results that were available during my care of the patient were reviewed by me and considered in my medical decision making (see chart for details).  Clinical Course as of Feb 12 706  Sat Feb 12, 2020  1226 D/w [facility] Cards on call - [place] - unable to interrogate device today - he will route message to his primary cards, see if they can address this on Monday.    [RD]    Clinical Course User Index [RD] Lucrezia Starch, MD   MDM Rules/Calculators/A&P                      86 year old gentleman presenting to ER with concern for generalized weakness, fatigue as well as intermittent episodes of sensation of worsened weakness.  When trying to clarify these episodes, patient denies any frank syncope and does not sound like near syncope.  Here, patient was well-appearing and no ongoing symptoms.  Vital signs were stable, per recent visits has had mild bradycardia.  EKG without acute change, labs were grossly within normal limits. Given patient's current appearance, no ongoing symptoms, work up today, believe he can be discharged and managed in the out pt setting with close f/u with PCP and cards.  Reviewed with cardiology on-call, unable to interrogate his monitor today. He will route chart to his primary cardiologist - will try to get him in Monday. Instructed pt to call Mon am, return for any recurring or worsening symptoms.     After the discussed management above, the patient was determined to be safe for discharge.  The patient was in agreement with this plan and all questions regarding their care were answered.  ED return precautions were discussed and the patient will return to the ED with any  significant worsening of condition.  Final Clinical Impression(s) / ED Diagnoses Final diagnoses:  Generalized weakness  Bradycardia    Rx / DC Orders ED Discharge Orders    None       Lucrezia Starch, MD 02/13/20 [phone]

## 2020-02-16 ENCOUNTER — Encounter: Payer: Self-pay | Admitting: Cardiology

## 2020-02-16 ENCOUNTER — Other Ambulatory Visit: Payer: Self-pay

## 2020-02-16 ENCOUNTER — Ambulatory Visit (INDEPENDENT_AMBULATORY_CARE_PROVIDER_SITE_OTHER): Payer: Medicare Other | Admitting: Cardiology

## 2020-02-16 VITALS — BP 120/72 | HR 62 | Ht 67.0 in | Wt 158.0 lb

## 2020-02-16 DIAGNOSIS — R42 Dizziness and giddiness: Secondary | ICD-10-CM | POA: Diagnosis not present

## 2020-02-16 DIAGNOSIS — I471 Supraventricular tachycardia: Secondary | ICD-10-CM

## 2020-02-16 DIAGNOSIS — I2510 Atherosclerotic heart disease of native coronary artery without angina pectoris: Secondary | ICD-10-CM | POA: Diagnosis not present

## 2020-02-16 DIAGNOSIS — I10 Essential (primary) hypertension: Secondary | ICD-10-CM

## 2020-02-16 NOTE — Progress Notes (Signed)
Cardiology Office Note:    Date:  02/16/2020   ID:  Oscar [name], DOB 1931/01/22, MRN [id number]  PCP:  Oscar Harvey, NP  Cardiologist:  Oscar Salines, DO  Electrophysiologist:  None   Referring MD: Oscar Harvey, NP   Chief Complaint  Patient presents with  . Follow-up    History of Present Illness:    Oscar [name] is a 86 y.o. male with a hx of paroxysmal atrial tachycardia seen on ZIO monitor, mild obstructive CAD from recent coronary angiography, left bundle branch block, recurrent syncope with implantable loop recorder in the past.   I last saw the patient back in November with his daughter.  At that time I started him on Toprol-XL 12.5 mg due to his paroxysmal atrial tachycardia.  In addition we discussed the notes of his coronary artery disease from the left heart catheterization.  In the interim he did see Dr. Bettina [name] in April 2021 and most recently Dr. Geraldo [name].  At his most recent visit due to recurrent dizziness and event monitor was placed on the patient.  He is wearing that monitor today.  He tells me that he started experiencing dizziness now when he is sitting and move his head around or sometimes even lying down when he moves his head as he feels dizzy.  Thankfully since I last saw him he has not had any episodes of syncope but he notes that his dizziness is persistent and he feels is getting worse.  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  He is concerned about this.  He also suspects that his thyroid medicine may be contributing as he tells me on days that he does not take his thyroid medicine his symptoms is not is worse at these that he takes his thyroid medicine.   Past Medical History:  Diagnosis Date  . Bladder tumor   . Cervical stenosis of spine   . E. coli infection   . GERD (gastroesophageal reflux  disease)   . History of appendectomy   . History of concussion    X2   NO RESIDUALS  . History of peptic ulcer 1970'S  . Hyperlipidemia   . Irregular heart beat   . LBBB (left bundle branch block)   . Status post placement of implantable loop recorder 11/08/2014    Past Surgical History:  Procedure Laterality Date  . ANTERIOR CERVICAL DECOMP/DISCECTOMY FUSION N/A 01/27/2018   Procedure: Anterior Cervical Discectomy Fusion - Cervical Four- Cervical Five;  Surgeon: Oscar Larsson, MD;  Location: [place];  Service: Neurosurgery;  Laterality: N/A;  Anterior Cervical Discectomy Fusion - Cervical Four- Cervical Five  . APPENDECTOMY  1970  . CARDIOVASCULAR STRESS TEST  03/22/09  . CYSTOSCOPY WITH BIOPSY N/A 02/10/2013   Procedure: CYSTOSCOPY WITH COLD CUP BIOPSY/FULGERATION;  Surgeon: Oscar Amass, MD;  Location: [facility];  Service: Urology;  Laterality: N/A;  ALSO FULGERATION   . INGUINAL HERNIA REPAIR Right 07-14-2002  . LEFT HEART CATH AND CORONARY ANGIOGRAPHY N/A 07/13/2019   Procedure: LEFT HEART CATH AND CORONARY ANGIOGRAPHY;  Surgeon: Oscar Sine, MD;  Location: [place] [facility];  Service: Cardiovascular;  Laterality: N/A;  . LOOP RECORDER IMPLANT N/A 05/24/2014   Procedure: LOOP RECORDER IMPLANT;  Surgeon: Oscar Klein, MD;  Location: [place] [facility];  Service: Cardiovascular;  Laterality: N/A;  . NECK SURGERY    . TONSILLECTOMY    . TRANSURETHRAL RESECTION OF BLADDER TUMOR  03-10-2008    Current Medications: Current Meds  Medication Sig  . aspirin EC 81 MG tablet Take 81 mg by mouth daily.  . furosemide (LASIX) 40 MG tablet Take 1 tablet (40 mg) 3 times a week on Tuesdays, Thursdays and Saturdays  . Glucosamine-Chondroit-Vit C-Mn (GLUCOSAMINE 1500 COMPLEX PO) Take 1 capsule by mouth daily.  Marland Kitchen MAGNESIUM PO Take 250 mg by mouth daily.   . metoprolol succinate (TOPROL-XL) 25 MG 24 hr tablet Take 1/2 (one-half) tablet by mouth once daily  . Olopatadine HCl 0.2 % SOLN  Place 1 drop into both eyes as needed (Dry eye).   Marland Kitchen omeprazole (PRILOSEC) 20 MG capsule Take 20 mg by mouth every morning.  . Potassium 99 MG TABS Take 198 mg by mouth daily.   Marland Kitchen TIROSINT 25 MCG CAPS Take 1 capsule by mouth 3 (three) times a week.     Allergies:   Patient has no known allergies.   Social History   Socioeconomic History  . Marital status: Married    Spouse name: Not on file  . Number of children: Not on file  . Years of education: Not on file  . Highest education level: Not on file  Occupational History  . Not on file  Tobacco Use  . Smoking status: Former Smoker    Packs/day: 0.50    Years: 30.00    Pack years: 15.00    Types: Cigarettes    Quit date: 02/06/1979    Years since quitting: 41.0  . Smokeless tobacco: Never Used  Substance and Sexual Activity  . Alcohol use: Yes    Alcohol/week: 0.0 standard drinks    Comment: 1 glass of beer once or twice a month  . Drug use: No  . Sexual activity: Not on file  Other Topics Concern  . Not on file  Social History Narrative  . Not on file   Social Determinants of Health   Financial Resource Strain:   . Difficulty of Paying Living Expenses:   Food Insecurity:   . Worried About Charity fundraiser in the Last Year:   . Arboriculturist in the Last Year:   Transportation Needs:   . Film/video editor (Medical):   Marland Kitchen Lack of Transportation (Non-Medical):   Physical Activity:   . Days of Exercise per Week:   . Minutes of Exercise per Session:   Stress:   . Feeling of Stress :   Social Connections:   . Frequency of Communication with Friends and Family:   . Frequency of Social Gatherings with Friends and Family:   . Attends Religious Services:   . Active Member of Clubs or Organizations:   . Attends Archivist Meetings:   Marland Kitchen Marital Status:      Family History: The patient's family history includes Diabetes in his brother; Heart failure in his brother and mother; Stroke in his father.   ROS:   Review of Systems  Constitution: Negative for decreased appetite, fever and weight gain.  HENT: Negative for congestion, ear discharge, hoarse voice and sore throat.   Eyes: Negative for discharge, redness, vision loss in right eye and visual halos.  Cardiovascular: Negative for chest pain, dyspnea on exertion, leg swelling, orthopnea and palpitations.  Respiratory: Negative for cough, hemoptysis, shortness of breath and snoring.   Endocrine: Negative for heat intolerance and polyphagia.  Hematologic/Lymphatic: Negative for bleeding problem. Does not bruise/bleed easily.  Skin: Negative for flushing, nail changes, rash and suspicious lesions.  Musculoskeletal: Negative for arthritis, joint pain, muscle cramps, myalgias, neck pain and stiffness.  Gastrointestinal: Negative for abdominal pain, bowel incontinence, diarrhea and excessive appetite.  Genitourinary: Negative for decreased libido, genital sores and incomplete emptying.  Neurological: Negative for brief paralysis, focal weakness, headaches and loss of balance.  Psychiatric/Behavioral: Negative for altered mental status, depression and suicidal ideas.  Allergic/Immunologic: Negative for HIV exposure and persistent infections.    EKGs/Labs/Other Studies Reviewed:    The following studies were reviewed today:   EKG:  None today  LHC   Prox RCA lesion is 10% stenosed.  Prox Cx to Mid Cx lesion is 20% stenosed.  Mid Cx lesion is 30% stenosed.  1st Diag lesion is 20% stenosed.  The left ventricular systolic function is normal.  LV end diastolic pressure is normal.  The left ventricular ejection fraction is 50-55% by visual estimate.  There is trivial (1+) mitral regurgitation.   Mild nonobstructive CAD with 20% narrowing in a proximal diagonal branch of the LAD prior to its bifurcation and a otherwise normal LAD system; smooth proximal 20% left circumflex stenosis followed by 30% smooth eccentric mid AV groove  stenosis; and mild 10% smooth narrowing in the mid dominant RCA.  Low normal global LV function with EF estimate at 50 to 55% in this patient with chronic left bundle branch block.  There appears to be only trivial mitral regurgitation. LVEDP 13 mm Hg.  Recent Labs: 06/29/2019: Magnesium 2.1 01/22/2020: TSH 2.137 02/12/2020: ALT 17; BUN 13; Creatinine, Ser 1.08; Hemoglobin 13.8; Platelets 190; Potassium 4.0; Sodium 139  Recent Lipid Panel No results found for: CHOL, TRIG, HDL, CHOLHDL, VLDL, LDLCALC, LDLDIRECT  Physical Exam:    VS:  BP 120/72   Pulse 62   Ht 5\' 7"  (1.702 m)   Wt 158 lb (71.7 kg)   SpO2 98%   BMI 24.75 kg/m     Wt Readings from Last 3 Encounters:  02/16/20 158 lb (71.7 kg)  02/12/20 155 lb (70.3 kg)  01/29/20 155 lb (70.3 kg)     GEN: Well nourished, well developed in no acute distress HEENT: Normal NECK: No JVD; No carotid bruits LYMPHATICS: No lymphadenopathy CARDIAC: S1S2 noted,RRR, no murmurs, rubs, gallops RESPIRATORY:  Clear to auscultation without rales, wheezing or rhonchi  ABDOMEN: Soft, non-tender, non-distended, +bowel sounds, no guarding. EXTREMITIES: No edema, No cyanosis, no clubbing MUSCULOSKELETAL:  No deformity  SKIN: Warm and dry NEUROLOGIC:  Alert and oriented x 3, non-focal PSYCHIATRIC:  Normal affect, good insight  ASSESSMENT:    1. Dizziness   2. PAT (paroxysmal atrial tachycardia) ([place])   3. Coronary artery disease involving native coronary artery of native heart without angina pectoris   4. Essential hypertension    PLAN:      He will continue to wear the monitor until the completion in 30 days.  I will see the patient and discuss his monitor results with him.  I have sent communication to my partner-Dr. Revankar asking that the monitor be forwarded to me once the patient has returned.  In addition I think for completeness this patient should be evaluated by ENT.  I have sent a ENT referral to rule out any inner ear  disequilibrium that may be contributing at this time.  Coronary artery disease this is stable.  Continue current medication regimen.  Hypertension-blood pressures are point office today.  Paroxysmal  atrial tachycardia, continue patient on his beta-blocker.  The patient is in agreement with the above plan. The patient left the office in stable condition.  The patient will follow up in   Medication Adjustments/Labs and Tests Ordered: Current medicines are reviewed at length with the patient today.  Concerns regarding medicines are outlined above.  Orders Placed This Encounter  Procedures  . Ambulatory referral to ENT   No orders of the defined types were placed in this encounter.   Patient Instructions  Medication Instructions:  Your physician recommends that you continue on your current medications as directed. Please refer to the Current Medication list given to you today.  *If you need a refill on your cardiac medications before your next appointment, please call your pharmacy*   Lab Work: None ordered  If you have labs (blood work) drawn today and your tests are completely normal, you will receive your results only by: Marland Kitchen MyChart Message (if you have MyChart) OR . A paper copy in the mail If you have any lab test that is abnormal or we need to change your treatment, we will call you to review the results.   Testing/Procedures: None ordere   You have been referred to Dr. Melony Overly, ENT.  They will contact you with an appointment    Follow-Up: At [facility], you and your health needs are our priority.  As part of our continuing mission to provide you with exceptional heart care, we have created designated Provider Care Teams.  These Care Teams include your primary Cardiologist (physician) and Advanced Practice Providers (APPs -  Physician Assistants and Nurse Practitioners) who all work together to provide you with the care you need, when you need it.  We  recommend signing up for the patient portal called "MyChart".  Sign up information is provided on this After Visit Summary.  MyChart is used to connect with patients for Virtual Visits (Telemedicine).  Patients are able to view lab/test results, encounter notes, upcoming appointments, etc.  Non-urgent messages can be sent to your provider as well.   To learn more about what you can do with MyChart, go to [email or web address].    Your next appointment:   2 month(s)   03/23/2020 arrive at 9:45 to see Dr. Harriet Masson in the [facility]  The format for your next appointment:   In Person  Provider:   Berniece Salines, DO   Other Instructions      Adopting a Healthy Lifestyle.  Know what a healthy weight is for you (roughly BMI <25) and aim to maintain this   Aim for 7+ servings of fruits and vegetables daily   65-80+ fluid ounces of water or unsweet tea for healthy kidneys   Limit to max 1 drink of alcohol per day; avoid smoking/tobacco   Limit animal fats in diet for cholesterol and heart health - choose grass fed whenever available   Avoid highly processed foods, and foods high in saturated/trans fats   Aim for low stress - take time to unwind and care for your mental health   Aim for 150 min of moderate intensity exercise weekly for heart health, and weights twice weekly for bone health   Aim for 7-9 hours of sleep daily   When it comes to diets, agreement about the perfect plan isnt easy to find, even among the experts. Experts at the [employer] developed an idea known as the Healthy Eating Plate. Just imagine a plate divided into logical,  healthy portions.   The emphasis is on diet quality:   Load up on vegetables and fruits - one-half of your plate: Aim for color and variety, and remember that potatoes dont count.   Go for whole grains - one-quarter of your plate: Whole wheat, barley, wheat berries, quinoa, oats, brown rice, and foods made with them. If  you want pasta, go with whole wheat pasta.   Protein power - one-quarter of your plate: Fish, chicken, beans, and nuts are all healthy, versatile protein sources. Limit red meat.   The diet, however, does go beyond the plate, offering a few other suggestions.   Use healthy plant oils, such as olive, canola, soy, corn, sunflower and peanut. Check the labels, and avoid partially hydrogenated oil, which have unhealthy trans fats.   If youre thirsty, drink water. Coffee and tea are good in moderation, but skip sugary drinks and limit milk and dairy products to one or two daily servings.   The type of carbohydrate in the diet is more important than the amount. Some sources of carbohydrates, such as vegetables, fruits, whole grains, and beans-are healthier than others.   Finally, stay active  Signed, Oscar Salines, DO  02/16/2020 10:07 AM    [place]

## 2020-02-16 NOTE — Patient Instructions (Addendum)
Medication Instructions:  Your physician recommends that you continue on your current medications as directed. Please refer to the Current Medication list given to you today.  *If you need a refill on your cardiac medications before your next appointment, please call your pharmacy*   Lab Work: None ordered  If you have labs (blood work) drawn today and your tests are completely normal, you will receive your results only by: Marland Kitchen MyChart Message (if you have MyChart) OR . A paper copy in the mail If you have any lab test that is abnormal or we need to change your treatment, we will call you to review the results.   Testing/Procedures: None ordere   You have been referred to Dr. Melony Overly, ENT.  They will contact you with an appointment    Follow-Up: At [facility], you and your health needs are our priority.  As part of our continuing mission to provide you with exceptional heart care, we have created designated Provider Care Teams.  These Care Teams include your primary Cardiologist (physician) and Advanced Practice Providers (APPs -  Physician Assistants and Nurse Practitioners) who all work together to provide you with the care you need, when you need it.  We recommend signing up for the patient portal called "MyChart".  Sign up information is provided on this After Visit Summary.  MyChart is used to connect with patients for Virtual Visits (Telemedicine).  Patients are able to view lab/test results, encounter notes, upcoming appointments, etc.  Non-urgent messages can be sent to your provider as well.   To learn more about what you can do with MyChart, go to [email or web address].    Your next appointment:   2 month(s)   03/23/2020 arrive at 9:45 to see Dr. Harriet Masson in the [facility]  The format for your next appointment:   In Person  Provider:   Berniece Salines, DO   Other Instructions

## 2020-02-18 ENCOUNTER — Telehealth: Payer: Self-pay | Admitting: Cardiology

## 2020-02-18 NOTE — Telephone Encounter (Signed)
That will be fine with me. 

## 2020-02-18 NOTE — Telephone Encounter (Signed)
Patient is requesting to switch from Dr. Harriet Masson to Dr. Geraldo Pitter.

## 2020-02-22 ENCOUNTER — Telehealth: Payer: Self-pay | Admitting: Cardiology

## 2020-02-22 NOTE — Telephone Encounter (Signed)
Spoke to the patient just now and let him know that Dr. Harriet Masson said to go ahead and mail his heart monitor back in. He states that he will do this and he does not have any other issues or concerns at this time.   Encouraged patient to call back with any questions or concerns.

## 2020-02-22 NOTE — Telephone Encounter (Signed)
   Went to chart to check who called pt. Transferred to [employer]

## 2020-02-22 NOTE — Telephone Encounter (Signed)
Patient is calling in regards to heart monitor. He states he began wearing heart monitor on 02/01/20. However, he ran out of sticky adhesive strips for heart monitor and stopped wearing the monitor on 02/21/20. Please assist.

## 2020-02-22 NOTE — Telephone Encounter (Signed)
Tried calling patient. No answer and no voicemail set up for me to leave a message. 

## 2020-02-22 NOTE — Telephone Encounter (Signed)
He can mail it back

## 2020-02-22 NOTE — Telephone Encounter (Signed)
ok 

## 2020-03-06 ENCOUNTER — Telehealth: Payer: Self-pay | Admitting: Cardiology

## 2020-03-06 NOTE — Telephone Encounter (Signed)
Called patient to reschedule his appointment for 7-21 to sooner with Dr. Geraldo Pitter.  No answer and patient does not have a voicemail set up.

## 2020-03-06 NOTE — Telephone Encounter (Signed)
Patient is requesting a sooner appointment with Dr. Geraldo Pitter than his current one 7/21 to discuss his monitor results.

## 2020-03-07 NOTE — Telephone Encounter (Signed)
Spoke to patient just now and let him know that we got him scheduled for 03/24/20 at 2:20 pm to see Dr. Geraldo Pitter in [facility]. The patient and the patients wife verbalize understanding of this.    Encouraged patient to call back with any questions or concerns.

## 2020-03-12 ENCOUNTER — Other Ambulatory Visit: Payer: Self-pay

## 2020-03-12 ENCOUNTER — Observation Stay (HOSPITAL_COMMUNITY): Payer: Medicare Other

## 2020-03-12 ENCOUNTER — Encounter (HOSPITAL_COMMUNITY): Payer: Self-pay | Admitting: Internal Medicine

## 2020-03-12 ENCOUNTER — Emergency Department (HOSPITAL_COMMUNITY): Payer: Medicare Other

## 2020-03-12 ENCOUNTER — Inpatient Hospital Stay (HOSPITAL_COMMUNITY): Payer: Medicare Other

## 2020-03-12 ENCOUNTER — Inpatient Hospital Stay (HOSPITAL_COMMUNITY)
Admission: EM | Admit: 2020-03-12 | Discharge: 2020-03-14 | DRG: 065 | Disposition: A | Discharge status: Home Health | Payer: Medicare Other | Attending: Internal Medicine | Admitting: Internal Medicine

## 2020-03-12 DIAGNOSIS — Z981 Arthrodesis status: Secondary | ICD-10-CM | POA: Diagnosis not present

## 2020-03-12 DIAGNOSIS — Z8711 Personal history of peptic ulcer disease: Secondary | ICD-10-CM

## 2020-03-12 DIAGNOSIS — I739 Peripheral vascular disease, unspecified: Secondary | ICD-10-CM | POA: Diagnosis present

## 2020-03-12 DIAGNOSIS — I4891 Unspecified atrial fibrillation: Secondary | ICD-10-CM | POA: Diagnosis present

## 2020-03-12 DIAGNOSIS — E782 Mixed hyperlipidemia: Secondary | ICD-10-CM | POA: Diagnosis present

## 2020-03-12 DIAGNOSIS — Z79899 Other long term (current) drug therapy: Secondary | ICD-10-CM

## 2020-03-12 DIAGNOSIS — K219 Gastro-esophageal reflux disease without esophagitis: Secondary | ICD-10-CM | POA: Diagnosis present

## 2020-03-12 DIAGNOSIS — I447 Left bundle-branch block, unspecified: Secondary | ICD-10-CM | POA: Diagnosis present

## 2020-03-12 DIAGNOSIS — R471 Dysarthria and anarthria: Secondary | ICD-10-CM | POA: Diagnosis present

## 2020-03-12 DIAGNOSIS — I361 Nonrheumatic tricuspid (valve) insufficiency: Secondary | ICD-10-CM | POA: Diagnosis not present

## 2020-03-12 DIAGNOSIS — I10 Essential (primary) hypertension: Secondary | ICD-10-CM | POA: Diagnosis present

## 2020-03-12 DIAGNOSIS — R29810 Facial weakness: Secondary | ICD-10-CM | POA: Diagnosis present

## 2020-03-12 DIAGNOSIS — I340 Nonrheumatic mitral (valve) insufficiency: Secondary | ICD-10-CM | POA: Diagnosis not present

## 2020-03-12 DIAGNOSIS — R29706 NIHSS score 6: Secondary | ICD-10-CM | POA: Diagnosis present

## 2020-03-12 DIAGNOSIS — Z66 Do not resuscitate: Secondary | ICD-10-CM | POA: Diagnosis present

## 2020-03-12 DIAGNOSIS — I471 Supraventricular tachycardia: Secondary | ICD-10-CM

## 2020-03-12 DIAGNOSIS — I639 Cerebral infarction, unspecified: Secondary | ICD-10-CM | POA: Diagnosis present

## 2020-03-12 DIAGNOSIS — G8191 Hemiplegia, unspecified affecting right dominant side: Secondary | ICD-10-CM | POA: Diagnosis present

## 2020-03-12 DIAGNOSIS — Z833 Family history of diabetes mellitus: Secondary | ICD-10-CM | POA: Diagnosis not present

## 2020-03-12 DIAGNOSIS — Z8551 Personal history of malignant neoplasm of bladder: Secondary | ICD-10-CM | POA: Diagnosis not present

## 2020-03-12 DIAGNOSIS — Z87891 Personal history of nicotine dependence: Secondary | ICD-10-CM

## 2020-03-12 DIAGNOSIS — Z8249 Family history of ischemic heart disease and other diseases of the circulatory system: Secondary | ICD-10-CM

## 2020-03-12 DIAGNOSIS — Z20822 Contact with and (suspected) exposure to covid-19: Secondary | ICD-10-CM | POA: Diagnosis present

## 2020-03-12 DIAGNOSIS — Z7982 Long term (current) use of aspirin: Secondary | ICD-10-CM

## 2020-03-12 DIAGNOSIS — E039 Hypothyroidism, unspecified: Secondary | ICD-10-CM | POA: Diagnosis present

## 2020-03-12 DIAGNOSIS — R4701 Aphasia: Secondary | ICD-10-CM | POA: Diagnosis present

## 2020-03-12 DIAGNOSIS — Z823 Family history of stroke: Secondary | ICD-10-CM | POA: Diagnosis not present

## 2020-03-12 DIAGNOSIS — Z79890 Hormone replacement therapy (postmenopausal): Secondary | ICD-10-CM

## 2020-03-12 HISTORY — DX: Cerebral infarction, unspecified: I63.9

## 2020-03-12 HISTORY — DX: Hypothyroidism, unspecified: E03.9

## 2020-03-12 HISTORY — DX: Essential (primary) hypertension: I10

## 2020-03-12 LAB — CBC
HCT: 44.3 % (ref 39.0–52.0)
Hemoglobin: 13.9 g/dL (ref 13.0–17.0)
MCH: 30.7 pg (ref 26.0–34.0)
MCHC: 31.4 g/dL (ref 30.0–36.0)
MCV: 97.8 fL (ref 80.0–100.0)
Platelets: 196 10*3/uL (ref 150–400)
RBC: 4.53 MIL/uL (ref 4.22–5.81)
RDW: 12.8 % (ref 11.5–15.5)
WBC: 6.1 10*3/uL (ref 4.0–10.5)
nRBC: 0 % (ref 0.0–0.2)

## 2020-03-12 LAB — I-STAT CHEM 8, ED
BUN: 28 mg/dL — ABNORMAL HIGH (ref 8–23)
Calcium, Ion: 1.13 mmol/L — ABNORMAL LOW (ref 1.15–1.40)
Chloride: 105 mmol/L (ref 98–111)
Creatinine, Ser: 1.1 mg/dL (ref 0.61–1.24)
Glucose, Bld: 102 mg/dL — ABNORMAL HIGH (ref 70–99)
HCT: 40 % (ref 39.0–52.0)
Hemoglobin: 13.6 g/dL (ref 13.0–17.0)
Potassium: 5.1 mmol/L (ref 3.5–5.1)
Sodium: 138 mmol/L (ref 135–145)
TCO2: 26 mmol/L (ref 22–32)

## 2020-03-12 LAB — CBG MONITORING, ED: Glucose-Capillary: 100 mg/dL — ABNORMAL HIGH (ref 70–99)

## 2020-03-12 LAB — DIFFERENTIAL
Abs Immature Granulocytes: 0.02 10*3/uL (ref 0.00–0.07)
Basophils Absolute: 0 10*3/uL (ref 0.0–0.1)
Basophils Relative: 1 %
Eosinophils Absolute: 0.4 10*3/uL (ref 0.0–0.5)
Eosinophils Relative: 7 %
Immature Granulocytes: 0 %
Lymphocytes Relative: 18 %
Lymphs Abs: 1.1 10*3/uL (ref 0.7–4.0)
Monocytes Absolute: 0.6 10*3/uL (ref 0.1–1.0)
Monocytes Relative: 10 %
Neutro Abs: 3.9 10*3/uL (ref 1.7–7.7)
Neutrophils Relative %: 64 %

## 2020-03-12 LAB — COMPREHENSIVE METABOLIC PANEL
ALT: 10 U/L (ref 0–44)
AST: 30 U/L (ref 15–41)
Albumin: 3.6 g/dL (ref 3.5–5.0)
Alkaline Phosphatase: 52 U/L (ref 38–126)
Anion gap: 10 (ref 5–15)
BUN: 21 mg/dL (ref 8–23)
CO2: 21 mmol/L — ABNORMAL LOW (ref 22–32)
Calcium: 9.1 mg/dL (ref 8.9–10.3)
Chloride: 105 mmol/L (ref 98–111)
Creatinine, Ser: 1.11 mg/dL (ref 0.61–1.24)
GFR calc Af Amer: >60 mL/min (ref >60)
GFR calc non Af Amer: 59 mL/min — ABNORMAL LOW (ref >60)
Glucose, Bld: 103 mg/dL — ABNORMAL HIGH (ref 70–99)
Potassium: 5.4 mmol/L — ABNORMAL HIGH (ref 3.5–5.1)
Sodium: 136 mmol/L (ref 135–145)
Total Bilirubin: 1 mg/dL (ref 0.3–1.2)
Total Protein: 6 g/dL — ABNORMAL LOW (ref 6.5–8.1)

## 2020-03-12 LAB — ECHOCARDIOGRAM COMPLETE
Height: 67 in
Weight: 2486.79 oz

## 2020-03-12 LAB — TSH: TSH: 2.883 u[IU]/mL (ref 0.350–4.500)

## 2020-03-12 LAB — PROTIME-INR
INR: 1 (ref 0.8–1.2)
Prothrombin Time: 12.6 seconds (ref 11.4–15.2)

## 2020-03-12 LAB — APTT: aPTT: 30 seconds (ref 24–36)

## 2020-03-12 LAB — SARS CORONAVIRUS 2 BY RT PCR (HOSPITAL ORDER, PERFORMED IN ~~LOC~~ HOSPITAL LAB): SARS Coronavirus 2: NEGATIVE

## 2020-03-12 MED ORDER — SODIUM CHLORIDE 0.9% FLUSH: 3.0000 mL | Freq: Once | INTRAVENOUS | Status: DC

## 2020-03-12 MED ORDER — OLOPATADINE HCL 0.1 % OP SOLN
1.0000 [drp] | Freq: Two times a day (BID) | OPHTHALMIC | Status: DC
  Administered 2020-03-12 – 2020-03-14 (×4): 1 [drp] via OPHTHALMIC
  Filled 2020-03-12: qty 5

## 2020-03-12 MED ORDER — SODIUM CHLORIDE 0.9 % IV SOLN: Freq: Once | INTRAVENOUS | Status: AC

## 2020-03-12 MED ORDER — ASPIRIN 81 MG PO CHEW
650.0000 mg | CHEWABLE_TABLET | Freq: Once | ORAL | Status: DC
  Filled 2020-03-12: qty 9

## 2020-03-12 MED ORDER — ASPIRIN EC 325 MG PO TBEC
650.0000 mg | DELAYED_RELEASE_TABLET | Freq: Once | ORAL | Status: AC
  Administered 2020-03-12: 650 mg via ORAL
  Filled 2020-03-12: qty 2

## 2020-03-12 MED ORDER — CLOPIDOGREL BISULFATE 75 MG PO TABS: 75.0000 mg | ORAL_TABLET | Freq: Every day | ORAL | Status: DC

## 2020-03-12 MED ORDER — ATORVASTATIN CALCIUM 40 MG PO TABS
40.0000 mg | ORAL_TABLET | Freq: Every day | ORAL | Status: DC
  Administered 2020-03-12 – 2020-03-14 (×3): 40 mg via ORAL
  Filled 2020-03-12 (×3): qty 1

## 2020-03-12 MED ORDER — PANTOPRAZOLE SODIUM 40 MG PO TBEC
40.0000 mg | DELAYED_RELEASE_TABLET | Freq: Every day | ORAL | Status: DC
  Administered 2020-03-12 – 2020-03-14 (×3): 40 mg via ORAL
  Filled 2020-03-12 (×3): qty 1

## 2020-03-12 MED ORDER — ASPIRIN 325 MG PO TABS
325.0000 mg | ORAL_TABLET | Freq: Every day | ORAL | Status: DC
  Administered 2020-03-13: 325 mg via ORAL
  Filled 2020-03-12: qty 1

## 2020-03-12 MED ORDER — CLOPIDOGREL BISULFATE 75 MG PO TABS
75.0000 mg | ORAL_TABLET | Freq: Every day | ORAL | Status: DC
  Administered 2020-03-12 – 2020-03-14 (×3): 75 mg via ORAL
  Filled 2020-03-12 (×3): qty 1

## 2020-03-12 MED ORDER — IOHEXOL 350 MG/ML SOLN
115.0000 mL | Freq: Once | INTRAVENOUS | Status: AC | PRN
  Administered 2020-03-12: 115 mL via INTRAVENOUS

## 2020-03-12 MED ORDER — SODIUM CHLORIDE 0.9 % IV SOLN: INTRAVENOUS | Status: DC

## 2020-03-12 MED ORDER — ACETAMINOPHEN 650 MG RE SUPP: 650.0000 mg | RECTAL | Status: DC | PRN

## 2020-03-12 MED ORDER — STROKE: EARLY STAGES OF RECOVERY BOOK
Freq: Once | Status: AC
  Filled 2020-03-12: qty 1

## 2020-03-12 MED ORDER — ASPIRIN 300 MG RE SUPP: 300.0000 mg | Freq: Every day | RECTAL | Status: DC

## 2020-03-12 MED ORDER — ACETAMINOPHEN 325 MG PO TABS: 650.0000 mg | ORAL_TABLET | ORAL | Status: DC | PRN

## 2020-03-12 MED ORDER — SENNOSIDES-DOCUSATE SODIUM 8.6-50 MG PO TABS: 1.0000 | ORAL_TABLET | Freq: Every evening | ORAL | Status: DC | PRN

## 2020-03-12 MED ORDER — ENOXAPARIN SODIUM 40 MG/0.4ML ~~LOC~~ SOLN
40.0000 mg | SUBCUTANEOUS | Status: DC
  Administered 2020-03-12 – 2020-03-13 (×2): 40 mg via SUBCUTANEOUS
  Filled 2020-03-12 (×2): qty 0.4

## 2020-03-12 MED ORDER — PERFLUTREN LIPID MICROSPHERE
1.0000 mL | INTRAVENOUS | Status: AC | PRN
  Administered 2020-03-12: 2 mL via INTRAVENOUS
  Filled 2020-03-12: qty 10

## 2020-03-12 MED ORDER — ACETAMINOPHEN 160 MG/5ML PO SOLN: 650.0000 mg | ORAL | Status: DC | PRN

## 2020-03-12 NOTE — Consult Note (Signed)
Referring Physician: Dr. Christy Gentles    Chief Complaint: Acute onset of right sided paralysis with waxing and waning deficit  HPI: Oscar [name] is an 86 y.o. male with a PMHx of bladder tumor, cervical stenosis, concussions x 2, HLD and loop recorder placement who presents to the ED via EMS as a Code Stroke after he experienced acute onset of right sided weakness on awakening. LKN was 2230 when he went to bed. He awoke at 0030 with right sided weakness that then partially resolved. He decided to go back to sleep and on re-awakening at 0330, he had worsened right sided weakness. He then called EMS and on arrival they noted right sided weakness in addition to slurred speech. This partially resolved and then recurred while in the ambulance en route to the hospital. On arrival he continued to have right sided weakness, with right facial droop and dysarthria.   He states that he has no prior history of stroke or MI. He is not on a blood thinner. He takes daily ASA.   STAT CT obtained in the ED is negative for acute abnormality. ASPECTS 10.   LSN: [id number] tPA Given: No: Out of time window NIHSS: 6   Past Medical History:  Diagnosis Date   Bladder tumor    Cervical stenosis of spine    E. coli infection    GERD (gastroesophageal reflux disease)    History of appendectomy    History of concussion    X2   NO RESIDUALS   History of peptic ulcer 1970'S   Hyperlipidemia    Irregular heart beat    LBBB (left bundle branch block)    Status post placement of implantable loop recorder 11/08/2014    Past Surgical History:  Procedure Laterality Date   ANTERIOR CERVICAL DECOMP/DISCECTOMY FUSION N/A 01/27/2018   Procedure: Anterior Cervical Discectomy Fusion - Cervical Four- Cervical Five;  Surgeon: Earnie Larsson, MD;  Location: [place];  Service: Neurosurgery;  Laterality: N/A;  Anterior Cervical Discectomy Fusion - Cervical Four- Cervical Five   APPENDECTOMY  1970   CARDIOVASCULAR STRESS TEST   03/22/09   CYSTOSCOPY WITH BIOPSY N/A 02/10/2013   Procedure: CYSTOSCOPY WITH COLD CUP BIOPSY/FULGERATION;  Surgeon: Bernestine Amass, MD;  Location: [facility];  Service: Urology;  Laterality: N/A;  ALSO FULGERATION    INGUINAL HERNIA REPAIR Right 07-14-2002   LEFT HEART CATH AND CORONARY ANGIOGRAPHY N/A 07/13/2019   Procedure: LEFT HEART CATH AND CORONARY ANGIOGRAPHY;  Surgeon: Troy Sine, MD;  Location: [place] [facility];  Service: Cardiovascular;  Laterality: N/A;   LOOP RECORDER IMPLANT N/A 05/24/2014   Procedure: LOOP RECORDER IMPLANT;  Surgeon: Sanda Klein, MD;  Location: [place] [facility];  Service: Cardiovascular;  Laterality: N/A;   NECK SURGERY     TONSILLECTOMY     TRANSURETHRAL RESECTION OF BLADDER TUMOR  03-10-2008    Family History  Problem Relation Age of Onset   Heart failure Mother    Stroke Father    Diabetes Brother    Heart failure Brother    Social History:  reports that he quit smoking about 41 years ago. His smoking use included cigarettes. He has a 15.00 pack-year smoking history. He has never used smokeless tobacco. He reports current alcohol use. He reports that he does not use drugs.  Allergies: No Known Allergies  Medications:  No current facility-administered medications on file prior to encounter.   Current Outpatient Medications on File Prior to Encounter  Medication Sig Dispense  Refill   aspirin EC 81 MG tablet Take 81 mg by mouth daily.     furosemide (LASIX) 40 MG tablet Take 1 tablet (40 mg) 3 times a week on Tuesdays, Thursdays and Saturdays 36 tablet 3   Glucosamine-Chondroit-Vit C-Mn (GLUCOSAMINE 1500 COMPLEX PO) Take 1 capsule by mouth daily.     MAGNESIUM PO Take 250 mg by mouth daily.      metoprolol succinate (TOPROL-XL) 25 MG 24 hr tablet Take 1/2 (one-half) tablet by mouth once daily 15 tablet 3   Olopatadine HCl 0.2 % SOLN Place 1 drop into both eyes as needed (Dry eye).      omeprazole (PRILOSEC) 20 MG  capsule Take 20 mg by mouth every morning.     Potassium 99 MG TABS Take 198 mg by mouth daily.      TIROSINT 25 MCG CAPS Take 1 capsule by mouth 3 (three) times a week.      ROS: As per HPI. Detailed ROS deferred in the context of acuity of presentation.   Physical Examination: There were no vitals taken for this visit.  HEENT: [place]/AT Lungs: Respirations unlabored Ext: No edema  Neurologic Examination: Mental Status: Alert, fully oriented, thought content appropriate.  Speech dysarthric but otherwise fluent with intact naming, repetition and comprehension.  Cranial Nerves: II:  Visual fields intact without extinction to DSS. PERRL.  III,IV, VI: No ptosis. EOMI without nystagmus.  V,VII: Prominent right facial droop. Temp sensation equal bilaterally  VIII: Hearing intact to conversation IX,X: Mildly hypophonic speech XI: Decreased shoulder shrug on the right XII: midline tongue extension  Motor: RUE: 1/5 proximally and distally  RLE: 4/5 proximally and distally LUE and LLE 5/5 Sensory: Temp and light touch intact throughout, bilaterally. No extinction to DSS.  Deep Tendon Reflexes:  2+ bilateral brachioradialis, biceps and patellae Plantars: Right: Briskly upgoing   Left: Upgoing Cerebellar: No ataxia with left FNF Gait: Deferred  Results for orders placed or performed during the hospital encounter of 03/12/20 (from the past 48 hour(s))  CBG monitoring, ED     Status: Abnormal   Collection Time: 03/12/20  4:50 AM  Result Value Ref Range   Glucose-Capillary 100 (H) 70 - 99 mg/dL    Comment: Glucose reference range applies only to samples taken after fasting for at least 8 hours.   Comment 1 Notify RN    Comment 2 Document in Chart    No results found.  Assessment: 86 y.o. male presenting with acute onset of right sided weakness, facial droop and dysarthria 1. Exam findings are most consistent with a left cerebral hemisphere lesion, most likely a subcortical stroke.  2.  CT head without acute abnormality 3. CTA head and neck with no LVO 4. CTP pending 5. History of loop recorder placement 6. Stroke Risk Factors - HLD   Recommendations: 1. Load with ASA 650 mg PO crushed if able to take thick liquids, otherwise administer 600 mg per rectum 2. MRI of the brain without contrast if able to obtain with loop recorder 3. PT consult, OT consult, Speech consult 4. TTE 5. Telemetry monitoring 6. Prophylactic therapy - Continue scheduled ASA. May need to switch to Plavix as he has failed ASA monotherapy.  7. Risk factor modification 8. HgbA1c, fasting lipid panel 9. Frequent neuro checks 10. Modified permissive HTN protocol given advanced age: Treat if SBP > 180 11. IV NS at 75 cc/hr. May need a bolus if SBP drops below 120.   @Electronically  signed: Dr. Kerney Elbe  03/12/2020, 5:09 AM

## 2020-03-12 NOTE — ED Notes (Signed)
Pt to MRI

## 2020-03-12 NOTE — ED Notes (Signed)
Pt NIH now 0. Regained complete mobility of right side. Pt states he still feels weakness.

## 2020-03-12 NOTE — Progress Notes (Signed)
  Echocardiogram 2D Echocardiogram with definity has been performed.  Oscar [name] M 03/12/2020, 3:34 PM

## 2020-03-12 NOTE — ED Triage Notes (Signed)
CODE Stroke   LKN 2230   EMS reports pts woke up at 0030 this morning with c/o of right side arm and leg paralysis. Symptoms improved, weakness continued pt returned to sleep. Woke again at 3:30 symptoms returned along with R Side numbness. EMS witnessed improvement of symptoms and return of symptoms x2. Pt states he never returned to baseline.   Pt presents to ED ongoing right side paralysis. No slurred speech. No facial droop. Denies pain. Daily ASA. Bilateral 18G IV.   EMS VS BP 106/90 HR 68 SpO2 97% RA' CBG 117

## 2020-03-12 NOTE — ED Notes (Signed)
Pt states RN can contact wife [name] at [phone] for updates.

## 2020-03-12 NOTE — H&P (Signed)
History and Physical    Oscar [name] IWP:[id number] DOB: [birth year] DOA: 03/12/2020  PCP: Berkley Harvey, NP Consultants:  Revankar - cardiology; Posey Pronto - endocrinology Patient coming from:  Home - lives with wife and adult daughter; NOK: Hansel Feinstein Riker [place], [phone]; [phone]   Chief Complaint: Neurologic deficits  HPI: Oscar [name] is a 86 y.o. male with medical history significant of HLD; post-thyroiditis hypothyroidism; and LBBB with loop recorder in 2016 presenting with neurologic deficits. He reports that he "lost everything on my right side".  It comes and does now.  Weakness of arm and leg, +dysarthria.  He woke up about 1230 and thinks he fell out of bed when he tried to get up because his leg wouldn't work.  He was able to get himself to the bathroom after about 15 minutes.  He went to bed again and woke up about 330 with the same issue.  He wasn't able to get up and so he woke up his wife and they called 911.  He has been having problems with his heart and "messing around with the heart doctors" for the last 6 weeks or so.  He has had low BP and dizzy, light-headed.  No dysphagia.   Chronic mild expressive aphasia.  He does not think he has any memory problems.    ED Course:  Carryover, per Dr. Marlowe Sax:  Presenting with waxing and waning right facial droop and right-sided weakness. Head CT negative. Seen by neurology and CTA head/neck, CT cerebral perfusion study ordered. Admission requested for stroke work-up.  Review of Systems: As per HPI; otherwise review of systems reviewed and negative.   Ambulatory Status:  Ambulates without assistance  COVID Vaccine Status:    Complete  Past Medical History:  Diagnosis Date  . Bladder tumor   . Cervical stenosis of spine   . E. coli infection   . Essential hypertension 03/12/2020  . GERD (gastroesophageal reflux disease)   . History of appendectomy   . History of concussion    X2   NO RESIDUALS  . History of  peptic ulcer 1970'S  . Hyperlipidemia   . Irregular heart beat   . LBBB (left bundle branch block)   . Status post placement of implantable loop recorder 11/08/2014    Past Surgical History:  Procedure Laterality Date  . ANTERIOR CERVICAL DECOMP/DISCECTOMY FUSION N/A 01/27/2018   Procedure: Anterior Cervical Discectomy Fusion - Cervical Four- Cervical Five;  Surgeon: Earnie Larsson, MD;  Location: [facility];  Service: Neurosurgery;  Laterality: N/A;  Anterior Cervical Discectomy Fusion - Cervical Four- Cervical Five  . APPENDECTOMY  1970  . CARDIOVASCULAR STRESS TEST  03/22/09  . CYSTOSCOPY WITH BIOPSY N/A 02/10/2013   Procedure: CYSTOSCOPY WITH COLD CUP BIOPSY/FULGERATION;  Surgeon: Bernestine Amass, MD;  Location: [facility];  Service: Urology;  Laterality: N/A;  ALSO FULGERATION   . INGUINAL HERNIA REPAIR Right 07-14-2002  . LEFT HEART CATH AND CORONARY ANGIOGRAPHY N/A 07/13/2019   Procedure: LEFT HEART CATH AND CORONARY ANGIOGRAPHY;  Surgeon: Troy Sine, MD;  Location: [facility];  Service: Cardiovascular;  Laterality: N/A;  . LOOP RECORDER IMPLANT N/A 05/24/2014   Procedure: LOOP RECORDER IMPLANT;  Surgeon: Sanda Klein, MD;  Location: [facility];  Service: Cardiovascular;  Laterality: N/A;  . NECK SURGERY    . TONSILLECTOMY    . TRANSURETHRAL RESECTION OF BLADDER TUMOR  03-10-2008    Social History   Socioeconomic History  . Marital status: Married  Spouse name: Not on file  . Number of children: Not on file  . Years of education: Not on file  . Highest education level: Not on file  Occupational History  . Occupation: retired  Tobacco Use  . Smoking status: Former Smoker    Packs/day: 0.50    Years: 30.00    Pack years: 15.00    Types: Cigarettes    Quit date: 02/06/1979    Years since quitting: 41.1  . Smokeless tobacco: Never Used  Vaping Use  . Vaping Use: Never used  Substance and Sexual Activity  . Alcohol use: Yes    Alcohol/week: 0.0  standard drinks    Comment: 1 glass of beer once or twice a month  . Drug use: No  . Sexual activity: Not on file  Other Topics Concern  . Not on file  Social History Narrative  . Not on file   Social Determinants of Health   Financial Resource Strain:   . Difficulty of Paying Living Expenses:   Food Insecurity:   . Worried About Charity fundraiser in the Last Year:   . Arboriculturist in the Last Year:   Transportation Needs:   . Film/video editor (Medical):   Marland Kitchen Lack of Transportation (Non-Medical):   Physical Activity:   . Days of Exercise per Week:   . Minutes of Exercise per Session:   Stress:   . Feeling of Stress :   Social Connections:   . Frequency of Communication with Friends and Family:   . Frequency of Social Gatherings with Friends and Family:   . Attends Religious Services:   . Active Member of Clubs or Organizations:   . Attends Archivist Meetings:   Marland Kitchen Marital Status:   Intimate Partner Violence:   . Fear of Current or Ex-Partner:   . Emotionally Abused:   Marland Kitchen Physically Abused:   . Sexually Abused:     No Known Allergies  Family History  Problem Relation Age of Onset  . Heart failure Mother   . Stroke Father   . Diabetes Brother   . Heart failure Brother     Prior to Admission medications   Medication Sig Start Date End Date Taking? Authorizing Provider  aspirin EC 81 MG tablet Take 81 mg by mouth daily.   Yes [provider]  Glucosamine-Chondroit-Vit C-Mn (GLUCOSAMINE 1500 COMPLEX PO) Take 1 capsule by mouth daily.   Yes [provider]  MAGNESIUM PO Take 250 mg by mouth daily.    Yes [provider]  metoprolol succinate (TOPROL-XL) 25 MG 24 hr tablet Take 1/2 (one-half) tablet by mouth once daily Patient taking differently: Take 12.5 mg by mouth daily.  01/11/20  Yes Tobb, Kardie, DO  Olopatadine HCl 0.2 % SOLN Place 1 drop into both eyes as needed (Dry eye).  03/01/19  Yes [provider]    omeprazole (PRILOSEC) 20 MG capsule Take 20 mg by mouth every morning.   Yes [provider]  Potassium 99 MG TABS Take 198 mg by mouth daily.    Yes [provider]  furosemide (LASIX) 40 MG tablet Take 1 tablet (40 mg) 3 times a week on Tuesdays, Thursdays and Saturdays Patient not taking: Reported on 03/12/2020 07/01/19   Berniece Salines, DO    Physical Exam: Vitals:   03/12/20 0630 03/12/20 0800 03/12/20 1000 03/12/20 1317  BP: (!) 142/78 (!) 152/88 (!) 144/77 135/90  Pulse: (!) 57 60 63 63  Resp: 18 16 13 20   Temp:      TempSrc:    Oral  SpO2: 99% 99% 99% 97%  Weight:      Height:         . General:  Appears calm and comfortable and is NAD . Eyes:  PERRL, EOMI, normal lids, iris . ENT:  grossly normal hearing, lips & tongue, mmm . Neck:  no LAD, masses or thyromegaly . Cardiovascular:  RRR, no m/r/g. No LE edema.  Marland Kitchen Respiratory:   CTA bilaterally with no wheezes/rales/rhonchi.  Normal respiratory effort. . Abdomen:  soft, NT, ND, NABS . Skin:  no rash or induration seen on limited exam . Musculoskeletal:  R upper and lower extremity weakness, 3-4/5 . Psychiatric:  grossly normal mood and affect, speech mildly dysarthric, AOx3 . Neurologic:  Subtle R facial droop, moves all extremities in coordinated fashion with decreased strength on the right, sensation intact other than along RLE    Radiological Exams on Admission: CT Code Stroke CTA Head W/WO contrast  Result Date: 03/12/2020 CLINICAL DATA:  Initial evaluation for acute dysarthria, right-sided weakness. EXAM: CT ANGIOGRAPHY HEAD AND NECK CT PERFUSION BRAIN TECHNIQUE: Multidetector CT imaging of the head and neck was performed using the standard protocol during bolus administration of intravenous contrast. Multiplanar CT image reconstructions and MIPs were obtained to evaluate the vascular anatomy. Carotid stenosis measurements (when applicable) are obtained utilizing NASCET criteria, using the distal  internal carotid diameter as the denominator. Multiphase CT imaging of the brain was performed following IV bolus contrast injection. Subsequent parametric perfusion maps were calculated using RAPID software. CONTRAST:  143mL OMNIPAQUE IOHEXOL 350 MG/ML SOLN COMPARISON:  Comparison made with prior head CT from earlier the same day. FINDINGS: CTA NECK FINDINGS Aortic arch: Visualized aortic arch of normal caliber with normal 3 vessel morphology. Moderate atherosclerotic change about the arch and origin of the great vessels without flow-limiting stenosis. Right carotid system: Right common carotid artery widely patent from its origin to the bifurcation without stenosis. Eccentric calcified plaque at the right bifurcation/proximal right ICA without hemodynamically significant stenosis. Right ICA widely patent distally to the skull base without stenosis, dissection or occlusion. Left carotid system: Left CCA mildly tortuous but widely patent to the bifurcation without stenosis. Eccentric calcified plaque at the left bifurcation/proximal left ICA without hemodynamically significant stenosis. Left ICA widely patent distally to the skull base without stenosis, dissection or occlusion. Vertebral arteries: Both vertebral arteries arise from the subclavian arteries. No proximal subclavian artery stenosis. Vertebral arteries mildly tortuous proximally but are widely patent to the skull base without stenosis, dissection or occlusion. Skeleton: No visible acute osseous abnormality. Patient status post prior fusion at C4-C6, with anterior plate fixation at A8-3. No discrete or worrisome osseous lesions. Other neck: No other acute soft tissue abnormality within the neck. Upper chest: Visualized upper chest demonstrates no acute finding. Centrilobular emphysematous changes noted within the visualized lungs. Review of the MIP images confirms the above findings CTA HEAD FINDINGS Anterior circulation: Both internal carotid arteries  patent to the termini without flow-limiting stenosis. A1 segments patent bilaterally. Normal anterior communicating artery complex. Anterior cerebral arteries patent to their distal aspects without stenosis. No M1 stenosis or occlusion. Normal MCA bifurcations. No proximal M2 branch occlusion. Distal MCA branches well perfused and symmetric. Posterior circulation: Vertebral arteries patent to the vertebrobasilar junction without stenosis. Both picas patent. Basilar patent to its distal aspect without stenosis. Superior cerebral arteries patent bilaterally. Both PCAs primarily supplied via the basilar  nerve well perfused to their distal aspects without appreciable stenosis. Venous sinuses: Grossly patent allowing for timing the contrast bolus. Anatomic variants: None significant.  No intracranial aneurysm. Review of the MIP images confirms the above findings CT Brain Perfusion Findings: ASPECTS: 10. CBF (<30%) Volume: 58mL Perfusion (Tmax>6.0s) volume: 45mL Mismatch Volume: 63mL Infarction Location:Negative CT perfusion with no evidence for acute core infarct or other perfusion deficit. IMPRESSION: CTA HEAD AND NECK IMPRESSION: 1. Negative CTA for emergent large vessel occlusion. 2. Mild-to-moderate atheromatous change about the aortic arch and carotid bifurcations without hemodynamically significant stenosis. 3.  Emphysema (ICD10-J43.9). CT PERFUSION IMPRESSION: Negative CT perfusion. No evidence for acute core infarct or other perfusion deficit. Critical Value/emergent results were called by telephone at the time of interpretation on 03/12/2020 at 5:30 am to provider ERIC [facility] , who verbally acknowledged these results. Electronically Signed   By: Jeannine Boga M.D.   On: 03/12/2020 06:17   CT Code Stroke CTA Neck W/WO contrast  Result Date: 03/12/2020 CLINICAL DATA:  Initial evaluation for acute dysarthria, right-sided weakness. EXAM: CT ANGIOGRAPHY HEAD AND NECK CT PERFUSION BRAIN TECHNIQUE:  Multidetector CT imaging of the head and neck was performed using the standard protocol during bolus administration of intravenous contrast. Multiplanar CT image reconstructions and MIPs were obtained to evaluate the vascular anatomy. Carotid stenosis measurements (when applicable) are obtained utilizing NASCET criteria, using the distal internal carotid diameter as the denominator. Multiphase CT imaging of the brain was performed following IV bolus contrast injection. Subsequent parametric perfusion maps were calculated using RAPID software. CONTRAST:  133mL OMNIPAQUE IOHEXOL 350 MG/ML SOLN COMPARISON:  Comparison made with prior head CT from earlier the same day. FINDINGS: CTA NECK FINDINGS Aortic arch: Visualized aortic arch of normal caliber with normal 3 vessel morphology. Moderate atherosclerotic change about the arch and origin of the great vessels without flow-limiting stenosis. Right carotid system: Right common carotid artery widely patent from its origin to the bifurcation without stenosis. Eccentric calcified plaque at the right bifurcation/proximal right ICA without hemodynamically significant stenosis. Right ICA widely patent distally to the skull base without stenosis, dissection or occlusion. Left carotid system: Left CCA mildly tortuous but widely patent to the bifurcation without stenosis. Eccentric calcified plaque at the left bifurcation/proximal left ICA without hemodynamically significant stenosis. Left ICA widely patent distally to the skull base without stenosis, dissection or occlusion. Vertebral arteries: Both vertebral arteries arise from the subclavian arteries. No proximal subclavian artery stenosis. Vertebral arteries mildly tortuous proximally but are widely patent to the skull base without stenosis, dissection or occlusion. Skeleton: No visible acute osseous abnormality. Patient status post prior fusion at C4-C6, with anterior plate fixation at M3-5. No discrete or worrisome osseous  lesions. Other neck: No other acute soft tissue abnormality within the neck. Upper chest: Visualized upper chest demonstrates no acute finding. Centrilobular emphysematous changes noted within the visualized lungs. Review of the MIP images confirms the above findings CTA HEAD FINDINGS Anterior circulation: Both internal carotid arteries patent to the termini without flow-limiting stenosis. A1 segments patent bilaterally. Normal anterior communicating artery complex. Anterior cerebral arteries patent to their distal aspects without stenosis. No M1 stenosis or occlusion. Normal MCA bifurcations. No proximal M2 branch occlusion. Distal MCA branches well perfused and symmetric. Posterior circulation: Vertebral arteries patent to the vertebrobasilar junction without stenosis. Both picas patent. Basilar patent to its distal aspect without stenosis. Superior cerebral arteries patent bilaterally. Both PCAs primarily supplied via the basilar nerve well perfused to their distal aspects without appreciable  stenosis. Venous sinuses: Grossly patent allowing for timing the contrast bolus. Anatomic variants: None significant.  No intracranial aneurysm. Review of the MIP images confirms the above findings CT Brain Perfusion Findings: ASPECTS: 10. CBF (<30%) Volume: 35mL Perfusion (Tmax>6.0s) volume: 10mL Mismatch Volume: 10mL Infarction Location:Negative CT perfusion with no evidence for acute core infarct or other perfusion deficit. IMPRESSION: CTA HEAD AND NECK IMPRESSION: 1. Negative CTA for emergent large vessel occlusion. 2. Mild-to-moderate atheromatous change about the aortic arch and carotid bifurcations without hemodynamically significant stenosis. 3.  Emphysema (ICD10-J43.9). CT PERFUSION IMPRESSION: Negative CT perfusion. No evidence for acute core infarct or other perfusion deficit. Critical Value/emergent results were called by telephone at the time of interpretation on 03/12/2020 at 5:30 am to provider ERIC [facility] , who  verbally acknowledged these results. Electronically Signed   By: Jeannine Boga M.D.   On: 03/12/2020 06:17   MR BRAIN WO CONTRAST  Result Date: 03/12/2020 CLINICAL DATA:  Acute dysarthria and right-sided weakness. EXAM: MRI HEAD WITHOUT CONTRAST TECHNIQUE: Multiplanar, multiecho pulse sequences of the brain and surrounding structures were obtained without intravenous contrast. COMPARISON:  CT head without contrast, CTA head and neck, and CT perfusion 03/12/2020 FINDINGS: Brain: A 13 mm linear acute nonhemorrhagic infarct is evident within the posterior limb of the left internal capsule, extending into the corona radiata. T2 signal changes are associated. Periventricular and scattered subcortical T2 hyperintensities are otherwise stable. Mild generalized atrophy is stable. The ventricles are proportionate to the degree of atrophy. No significant extraaxial fluid collection is present. Vascular: Flow is present in the major intracranial arteries. Bilateral lens replacements are noted. Globes and orbits are otherwise unremarkable. Skull and upper cervical spine: Degenerative changes are noted with fusion across the disc space at C3-4. Uncovertebral spurring contributes to foraminal narrowing. Diffuse fatty infiltration of the marrow is within normal limits for age. Craniocervical junction is otherwise normal. Sinuses/Orbits: The paranasal sinuses and mastoid air cells are clear. Bilateral lens replacements are noted. Globes and orbits are otherwise unremarkable. IMPRESSION: 1. 13 mm acute nonhemorrhagic infarct involving the posterior limb of the left internal capsule, extending into the corona radiata. This corresponds with the patient's acute symptoms. 2. Atrophy and mild white matter disease is otherwise within normal limits for age. Electronically Signed   By: San Morelle M.D.   On: 03/12/2020 11:22   CT Code Stroke Cerebral Perfusion with contrast  Result Date: 03/12/2020 CLINICAL DATA:   Initial evaluation for acute dysarthria, right-sided weakness. EXAM: CT ANGIOGRAPHY HEAD AND NECK CT PERFUSION BRAIN TECHNIQUE: Multidetector CT imaging of the head and neck was performed using the standard protocol during bolus administration of intravenous contrast. Multiplanar CT image reconstructions and MIPs were obtained to evaluate the vascular anatomy. Carotid stenosis measurements (when applicable) are obtained utilizing NASCET criteria, using the distal internal carotid diameter as the denominator. Multiphase CT imaging of the brain was performed following IV bolus contrast injection. Subsequent parametric perfusion maps were calculated using RAPID software. CONTRAST:  132mL OMNIPAQUE IOHEXOL 350 MG/ML SOLN COMPARISON:  Comparison made with prior head CT from earlier the same day. FINDINGS: CTA NECK FINDINGS Aortic arch: Visualized aortic arch of normal caliber with normal 3 vessel morphology. Moderate atherosclerotic change about the arch and origin of the great vessels without flow-limiting stenosis. Right carotid system: Right common carotid artery widely patent from its origin to the bifurcation without stenosis. Eccentric calcified plaque at the right bifurcation/proximal right ICA without hemodynamically significant stenosis. Right ICA widely patent distally to the skull  base without stenosis, dissection or occlusion. Left carotid system: Left CCA mildly tortuous but widely patent to the bifurcation without stenosis. Eccentric calcified plaque at the left bifurcation/proximal left ICA without hemodynamically significant stenosis. Left ICA widely patent distally to the skull base without stenosis, dissection or occlusion. Vertebral arteries: Both vertebral arteries arise from the subclavian arteries. No proximal subclavian artery stenosis. Vertebral arteries mildly tortuous proximally but are widely patent to the skull base without stenosis, dissection or occlusion. Skeleton: No visible acute osseous  abnormality. Patient status post prior fusion at C4-C6, with anterior plate fixation at N8-2. No discrete or worrisome osseous lesions. Other neck: No other acute soft tissue abnormality within the neck. Upper chest: Visualized upper chest demonstrates no acute finding. Centrilobular emphysematous changes noted within the visualized lungs. Review of the MIP images confirms the above findings CTA HEAD FINDINGS Anterior circulation: Both internal carotid arteries patent to the termini without flow-limiting stenosis. A1 segments patent bilaterally. Normal anterior communicating artery complex. Anterior cerebral arteries patent to their distal aspects without stenosis. No M1 stenosis or occlusion. Normal MCA bifurcations. No proximal M2 branch occlusion. Distal MCA branches well perfused and symmetric. Posterior circulation: Vertebral arteries patent to the vertebrobasilar junction without stenosis. Both picas patent. Basilar patent to its distal aspect without stenosis. Superior cerebral arteries patent bilaterally. Both PCAs primarily supplied via the basilar nerve well perfused to their distal aspects without appreciable stenosis. Venous sinuses: Grossly patent allowing for timing the contrast bolus. Anatomic variants: None significant.  No intracranial aneurysm. Review of the MIP images confirms the above findings CT Brain Perfusion Findings: ASPECTS: 10. CBF (<30%) Volume: 12mL Perfusion (Tmax>6.0s) volume: 52mL Mismatch Volume: 77mL Infarction Location:Negative CT perfusion with no evidence for acute core infarct or other perfusion deficit. IMPRESSION: CTA HEAD AND NECK IMPRESSION: 1. Negative CTA for emergent large vessel occlusion. 2. Mild-to-moderate atheromatous change about the aortic arch and carotid bifurcations without hemodynamically significant stenosis. 3.  Emphysema (ICD10-J43.9). CT PERFUSION IMPRESSION: Negative CT perfusion. No evidence for acute core infarct or other perfusion deficit. Critical  Value/emergent results were called by telephone at the time of interpretation on 03/12/2020 at 5:30 am to provider ERIC [facility] , who verbally acknowledged these results. Electronically Signed   By: Jeannine Boga M.D.   On: 03/12/2020 06:17   CT HEAD CODE STROKE WO CONTRAST  Result Date: 03/12/2020 CLINICAL DATA:  Code stroke. Initial evaluation for acute weakness, aphasia. Stroke. EXAM: CT HEAD WITHOUT CONTRAST TECHNIQUE: Contiguous axial images were obtained from the base of the skull through the vertex without intravenous contrast. COMPARISON:  Prior MRI from 12/16/2017. FINDINGS: Brain: Age-related cerebral atrophy with mild chronic small vessel ischemic disease. No acute intracranial hemorrhage. No acute large vessel territory infarct. No mass lesion, midline shift or mass effect. No hydrocephalus or extra-axial fluid collection. Vascular: No asymmetric hyperdense vessel. Scattered vascular calcifications noted within the carotid siphons. Skull: Scalp soft tissues and calvarium within normal limits. Sinuses/Orbits: Globes and orbital soft tissues are normal. Paranasal sinuses mastoid air cells are clear. Other: None. ASPECTS [facility] Stroke Program Early CT Score) - Ganglionic level infarction (caudate, lentiform nuclei, internal capsule, insula, M1-M3 cortex): 7 - Supraganglionic infarction (M4-M6 cortex): 3 Total score (0-10 with 10 being normal): 10 IMPRESSION: 1. No acute intracranial infarct or other abnormality. 2. ASPECTS is 10. 3. Generalized age-related cerebral atrophy with mild chronic small vessel ischemic disease. These results were communicated to Dr. Cheral Marker at 5:12 amon 6/20/2021by text page via the [facility] messaging system. Electronically Signed  By: Jeannine Boga M.D.   On: 03/12/2020 05:13    EKG: Independently reviewed.  NSR with rate 71; LBBB with no evidence of acute ischemia   Labs on Admission: I have personally reviewed the available labs and imaging studies at the  time of the admission.  Pertinent labs:   K+ 5.4 Glucose 103 Normal CBC   Assessment/Plan Principal Problem:   CVA (cerebral vascular accident) ([place]) Active Problems:   Atrial tachycardia, paroxysmal ([facility])   Mixed hyperlipidemia   Acute CVA (cerebrovascular accident) ([place])   Hypothyroidism (acquired)   Essential hypertension   CVA -Patient presenting with acute onset of R-sided UE and LE weakness and dysarthria, very suggestive of CVA -MRI confirms 13 mm nonhemorrhagic infarct involving the posterior limb of the left internal capsule, extending into the corona radiata -Will admit for further CVA evaluation -Telemetry monitoring -Echo -Risk stratification with FLP, A1c; will also check TSH -He has been taking ASA daily and so appears to have failed ASA therapy; he would likely benefit from transition to Plavix but will defer to the stroke team -Neurology consult -PT/OT/ST/Nutrition Consults -Consider CIR consult for placement - with waxing and waning ongoing symptoms, he may have a good chance for meaningful recovery  HTN -Allow permissive HTN for now -Treat BP only if >220/120, and then with goal of 15% reduction -Hold Toprol XL and plan to restart in 48-72 hours   HLD -Check FLP -Will start empiric Lipitor 40 mg daily   Atrial tachycardia -Rate controlled with Toprol XL -There are notations in his chart that indicate possible afib, although this was not mentioned on his recent cardiology notes -AC may need to be considered  Hypothyroidism -Check TSH -This was noted after he had Haskimoto's thyroiditis - perhaps transient -He does not appear to be taking medications for this issue at this time      Note: This patient has been tested and is negative for the novel coronavirus COVID-19.   DVT prophylaxis:  Lovenox  Code Status: DNR - confirmed with patient Family Communication: None present; I spoke with his wife of 80 years by telephone at the time of  admission Disposition Plan:  The patient is from: home  Anticipated d/c is to: be determined, based on therapy assessments; he currently seems likely to need CIR  Anticipated d/c date will depend on clinical response to treatment, likely several days depending on progression of symptoms  Patient is currently: acutely ill Consults called: Neurology; PT/OT/ST/Nutrition  Admission status: Admit - It is my clinical opinion that admission to [facility] is reasonable and necessary because of the expectation that this patient will require hospital care that crosses at least 2 midnights to treat this condition based on the medical complexity of the problems presented.  Given the aforementioned information, the predictability of an adverse outcome is felt to be significant.   Karmen Bongo MD [facility]   How to contact the [facility] Attending or Consulting provider [place] or covering provider during after hours [place], for this patient?  1. Check the care team in [facility] and look for a) attending/consulting [facility] provider listed and b) the [facility] team listed 2. Log into [email or web address] and use [place]'s universal password to access. If you do not have the password, please contact the hospital operator. 3. Locate the [facility] provider you are looking for under [facility] and page to a number that you can be directly reached. 4. If you still have difficulty reaching the provider, please  page the [facility] (Director on Call) for the Hospitalists listed on amion for assistance.   03/12/2020, 2:35 PM

## 2020-03-12 NOTE — Plan of Care (Signed)
°  Problem: Nutrition: °Goal: Adequate nutrition will be maintained °Outcome: Progressing °  °Problem: Coping: °Goal: Level of anxiety will decrease °Outcome: Progressing °  °Problem: Safety: °Goal: Ability to remain free from injury will improve °Outcome: Progressing °  °

## 2020-03-12 NOTE — Progress Notes (Signed)
The patient has had fluctuations involving the right sided weakness.  The right upper extremity is particularly more symptomatic with more fluctuations.  He is awake and alert and is lucid.  He has a mild dysarthria.  Visual fields are intact and facial muscle strength is normal.  Right proximal strength is 3/5 and hand movements are 1/5.  Right lower extremity proximally 4/5 and distal 5.  The patient is on aspirin 325.  Plavix will be added for dual antiplatelet therapy for the next week.  He is on IV fluids at 50 cc an hour and this will be continued.

## 2020-03-12 NOTE — ED Notes (Signed)
Dr. Lorin Mercy & neuro updated on patient fluctuating R. Arm/R. Leg weakness.

## 2020-03-12 NOTE — ED Provider Notes (Signed)
[place] EMERGENCY DEPARTMENT Provider Note   CSN: [id number] Arrival date & time: 03/12/20  0450     History Chief Complaint  Patient presents with   Code Stroke    Oscar [name] is a 86 y.o. male.  The history is provided by the patient and the EMS personnel.  Weakness Severity:  Severe Onset quality:  Sudden Timing:  Intermittent Progression:  Waxing and waning Chronicity:  New Relieved by:  None tried Worsened by:  Nothing Associated symptoms: no abdominal pain, no arthralgias, no chest pain and no fever   Patient with history of hyperlipidemia presents with right-sided weakness.  Patient was last known well at 2230 on June 19.  Patient woke up in middle the night and fell while try to go to the restroom.  He noted that he felt weak on the right side.  He felt some improvement and went back to sleep.  He woke up later with continued weakness.  EMS reports that patient has been having waxing and waning symptoms of his weakness.  On my history, patient denies any other complaints.  Denies any headache, no chest pain or shortness of breath.  Denies any traumatic injuries from the fall.     Past Medical History:  Diagnosis Date   Bladder tumor    Cervical stenosis of spine    E. coli infection    GERD (gastroesophageal reflux disease)    History of appendectomy    History of concussion    X2   NO RESIDUALS   History of peptic ulcer 1970'S   Hyperlipidemia    Irregular heart beat    LBBB (left bundle branch block)    Status post placement of implantable loop recorder 11/08/2014    Patient Active Problem List   Diagnosis Date Noted   Atrial tachycardia ([place]) 01/24/2020   Paroxysmal atrial fibrillation ([place]) 08/05/2019   Coronary artery disease involving native coronary artery of native heart without angina pectoris 08/05/2019   Mixed hyperlipidemia 08/05/2019   Abnormal nuclear stress test    Exertional dyspnea    Shiga  toxin-producing Escherichia coli infection 09/03/2018   Cervical myelopathy ([place]) 01/27/2018   Status post placement of implantable loop recorder 11/08/2014   Near syncope 05/16/2014   Cancer of bladder ([place]) 02/10/2013    Past Surgical History:  Procedure Laterality Date   ANTERIOR CERVICAL DECOMP/DISCECTOMY FUSION N/A 01/27/2018   Procedure: Anterior Cervical Discectomy Fusion - Cervical Four- Cervical Five;  Surgeon: Earnie Larsson, MD;  Location: [place];  Service: Neurosurgery;  Laterality: N/A;  Anterior Cervical Discectomy Fusion - Cervical Four- Cervical Five   APPENDECTOMY  1970   CARDIOVASCULAR STRESS TEST  03/22/09   CYSTOSCOPY WITH BIOPSY N/A 02/10/2013   Procedure: CYSTOSCOPY WITH COLD CUP BIOPSY/FULGERATION;  Surgeon: Bernestine Amass, MD;  Location[facility];  Service: Urology;  Laterality: N/A;  ALSO FULGERATION    INGUINAL HERNIA REPAIR Right 07-14-2002   LEFT HEART CATH AND CORONARY ANGIOGRAPHY N/A 07/13/2019   Procedure: LEFT HEART CATH AND CORONARY ANGIOGRAPHY;  Surgeon: Troy Sine, MD;  Location: [place] CV LAB;  Service: Cardiovascular;  Laterality: N/A;   LOOP RECORDER IMPLANT N/A 05/24/2014   Procedure: LOOP RECORDER IMPLANT;  Surgeon: Sanda Klein, MD;  Location: [place] CATH LAB;  Service: Cardiovascular;  Laterality: N/A;   NECK SURGERY     TONSILLECTOMY     TRANSURETHRAL RESECTION OF BLADDER TUMOR  03-10-2008       Family History  Problem Relation Age of Onset   Heart failure Mother    Stroke Father    Diabetes Brother    Heart failure Brother     Social History   Tobacco Use   Smoking status: Former Smoker    Packs/day: 0.50    Years: 30.00    Pack years: 15.00    Types: Cigarettes    Quit date: 02/06/1979    Years since quitting: 41.1   Smokeless tobacco: Never Used  Vaping Use   Vaping Use: Never used  Substance Use Topics   Alcohol use: Yes    Alcohol/week: 0.0 standard drinks    Comment: 1 glass of  beer once or twice a month   Drug use: No    Home Medications Prior to Admission medications   Medication Sig Start Date End Date Taking? Authorizing Provider  aspirin EC 81 MG tablet Take 81 mg by mouth daily.    [provider]  furosemide (LASIX) 40 MG tablet Take 1 tablet (40 mg) 3 times a week on Tuesdays, Thursdays and Saturdays 07/01/19   Tobb, Kardie, DO  Glucosamine-Chondroit-Vit C-Mn (GLUCOSAMINE 1500 COMPLEX PO) Take 1 capsule by mouth daily.    [provider]  MAGNESIUM PO Take 250 mg by mouth daily.     [provider]  metoprolol succinate (TOPROL-XL) 25 MG 24 hr tablet Take 1/2 (one-half) tablet by mouth once daily 01/11/20   Tobb, Kardie, DO  Olopatadine HCl 0.2 % SOLN Place 1 drop into both eyes as needed (Dry eye).  03/01/19   [provider]  omeprazole (PRILOSEC) 20 MG capsule Take 20 mg by mouth every morning.    [provider]  Potassium 99 MG TABS Take 198 mg by mouth daily.     [provider]  TIROSINT 25 MCG CAPS Take 1 capsule by mouth 3 (three) times a week. 08/03/19   [provider]    Allergies    Patient has no known allergies.  Review of Systems   Review of Systems  Constitutional: Negative for fever.  Cardiovascular: Negative for chest pain.  Gastrointestinal: Negative for abdominal pain.  Musculoskeletal: Negative for arthralgias.  Neurological: Positive for speech difficulty and weakness.  All other systems reviewed and are negative.   Physical Exam Updated Vital Signs BP (!) 153/78    Pulse 61    Resp 19    SpO2 97%   Physical Exam CONSTITUTIONAL: Elderly, no acute distress HEAD: Normocephalic/atraumatic EYES: EOMI/PERRL ENMT: Mucous membranes moist NECK: supple no meningeal signs SPINE/BACK:entire spine nontender CV: S1/S2 noted, no murmurs/rubs/gallops noted LUNGS: Lungs are clear to auscultation bilaterally, no apparent distress ABDOMEN: soft, nontender, no rebound or  guarding, bowel sounds noted throughout abdomen GU:no cva tenderness NEURO: Pt is awake/alert.  Dysarthria noted.  Right facial droop.  Right arm and leg drift noted.  No weakness noted in the left arm or leg. EXTREMITIES: pulses normal/equal, full ROM, no signs of traumatic injury SKIN: warm, color normal PSYCH: Unable to fully assess  ED Results / Procedures / Treatments   Labs (all labs ordered are listed, but only abnormal results are displayed) Labs Reviewed  COMPREHENSIVE METABOLIC PANEL - Abnormal; Notable for the following components:      Result Value   Potassium 5.4 (*)    CO2 21 (*)    Glucose, Bld 103 (*)    Total Protein 6.0 (*)    GFR calc non Af Amer 59 (*)    All other components within  normal limits  I-STAT CHEM 8, ED - Abnormal; Notable for the following components:   BUN 28 (*)    Glucose, Bld 102 (*)    Calcium, Ion 1.13 (*)    All other components within normal limits  CBG MONITORING, ED - Abnormal; Notable for the following components:   Glucose-Capillary 100 (*)    All other components within normal limits  SARS CORONAVIRUS 2 BY RT PCR [facility] ORDER, [place] LAB)  PROTIME-INR  APTT  CBC  DIFFERENTIAL    EKG EKG Interpretation  Date/Time:  Sunday March 12 2020 06:05:37 EDT Ventricular Rate:  71 PR Interval:    QRS Duration: 137 QT Interval:  434 QTC Calculation: 472 R Axis:   5 Text Interpretation: Sinus rhythm Atrial premature complex Prolonged PR interval Left bundle branch block Confirmed by Ripley Fraise [phone]) on 03/12/2020 6:13:17 AM   Radiology CT HEAD CODE STROKE WO CONTRAST  Result Date: 03/12/2020 CLINICAL DATA:  Code stroke. Initial evaluation for acute weakness, aphasia. Stroke. EXAM: CT HEAD WITHOUT CONTRAST TECHNIQUE: Contiguous axial images were obtained from the base of the skull through the vertex without intravenous contrast. COMPARISON:  Prior MRI from 12/16/2017. FINDINGS: Brain: Age-related  cerebral atrophy with mild chronic small vessel ischemic disease. No acute intracranial hemorrhage. No acute large vessel territory infarct. No mass lesion, midline shift or mass effect. No hydrocephalus or extra-axial fluid collection. Vascular: No asymmetric hyperdense vessel. Scattered vascular calcifications noted within the carotid siphons. Skull: Scalp soft tissues and calvarium within normal limits. Sinuses/Orbits: Globes and orbital soft tissues are normal. Paranasal sinuses mastoid air cells are clear. Other: None. ASPECTS [facility] Stroke Program Early CT Score) - Ganglionic level infarction (caudate, lentiform nuclei, internal capsule, insula, M1-M3 cortex): 7 - Supraganglionic infarction (M4-M6 cortex): 3 Total score (0-10 with 10 being normal): 10 IMPRESSION: 1. No acute intracranial infarct or other abnormality. 2. ASPECTS is 10. 3. Generalized age-related cerebral atrophy with mild chronic small vessel ischemic disease. These results were communicated to Dr. Cheral Marker at 5:12 amon 6/20/2021by text page via the [facility] messaging system. Electronically Signed   By: Jeannine Boga M.D.   On: 03/12/2020 05:13    Procedures Procedures    Medications Ordered in ED Medications  sodium chloride flush (NS) 0.9 % injection 3 mL (3 mLs Intravenous Not Given 03/12/20 0512)  0.9 %  sodium chloride infusion (has no administration in time range)  aspirin EC tablet 650 mg (has no administration in time range)  iohexol (OMNIPAQUE) 350 MG/ML injection 115 mL (115 mLs Intravenous Contrast Given 03/12/20 0506)    ED Course  I have reviewed the triage vital signs and the nursing notes.  Pertinent labs & imaging results that were available during my care of the patient were reviewed by me and considered in my medical decision making (see chart for details).    MDM Rules/Calculators/A&P                           This patient presents to the ED for concern of weaknes, this involves an extensive number  of treatment options, and is a complaint that carries with it a high risk of complications and morbidity.  The differential diagnosis includes stroke, UTI, electrolyte imbalance   Lab Tests:   I Ordered, reviewed, and interpreted labs, which included complete blood count, complete metabolic panel, urinalysis  Medicines ordered:   I ordered medication aspirin for CVA  Imaging Studies ordered:  I ordered imaging studies which included CT head   I independently visualized and interpreted imaging which showed no acute findings  Additional history obtained:    Previous records obtained and reviewed   Consultations Obtained:   I consulted neurology Dr. Cheral Marker and discussed lab and imaging findings  Reevaluation:  After the interventions stated above, I reevaluated the patient and found patient stable  5:38 AM Patient presents as a code stroke.  He had obvious right-sided deficits.  CT head was negative.  Patient does not have evidence of large vessel occlusion on imaging.  Discussed with Dr. Cheral Marker.  He requests admission to the hospitalist  tPA in stroke considered but not given due to: Onset over 3-4.5hours  6:16 AM Discussed with Dr. Marlowe Sax for admission to the medical service for stroke work-up Final Clinical Impression(s) / ED Diagnoses Final diagnoses:  Cerebrovascular accident (CVA), unspecified mechanism [facility])    Rx / [place] Orders ED Discharge Orders    None       Ripley Fraise, MD 03/12/20 [phone]

## 2020-03-12 NOTE — ED Notes (Addendum)
Confirmed order with neurology for 650 mg of PO ASA for pt.

## 2020-03-12 NOTE — ED Notes (Signed)
Report given to 3 west 

## 2020-03-13 ENCOUNTER — Inpatient Hospital Stay (HOSPITAL_COMMUNITY): Payer: Medicare Other

## 2020-03-13 LAB — LIPID PANEL
Cholesterol: 197 mg/dL (ref 0–200)
HDL: 51 mg/dL (ref >40)
LDL Cholesterol: 127 mg/dL — ABNORMAL HIGH (ref 0–99)
Total CHOL/HDL Ratio: 3.9 RATIO
Triglycerides: 97 mg/dL (ref <150)
VLDL: 19 mg/dL (ref 0–40)

## 2020-03-13 MED ORDER — STUDY - PACIFIC (STROKE) - BAY 2433334 (BLUE BOTTLE) 5, 15, 25MG OR PLACEBO TABLET (PI-SETHI)
1.0000 | ORAL_TABLET | Freq: Every day | ORAL | Status: DC
  Administered 2020-03-13: 1 via ORAL
  Filled 2020-03-13 (×2): qty 1

## 2020-03-13 MED ORDER — STUDY - PACIFIC (STROKE) - BAY 2433334 (GREEN BOTTLE) 5, 15, 25MG OR PLACEBO TABLET (PI-SETHI)
1.0000 | ORAL_TABLET | Freq: Every day | ORAL | Status: DC
  Administered 2020-03-13: 1 via ORAL
  Filled 2020-03-13 (×2): qty 1

## 2020-03-13 MED ORDER — ASPIRIN EC 81 MG PO TBEC
81.0000 mg | DELAYED_RELEASE_TABLET | Freq: Every day | ORAL | Status: DC
  Administered 2020-03-14: 81 mg via ORAL
  Filled 2020-03-13: qty 1

## 2020-03-13 NOTE — Progress Notes (Signed)
STROKE TEAM PROGRESS NOTE   INTERVAL HISTORY I have personally reviewed history of presenting illness with the patient, electronic medical records and imaging films in PACS.  He presented with dysarthria and facial droop and right-sided weakness which fluctuated.  He states is a lot better this morning.  Vital signs are stable.  MRI scan of the brain shows left posterior limb internal capsule infarct.  CT angiogram shows no significant large vessel stenosis in the neck of the brain.  Echocardiogram showed normal ejection fraction.  LDL cholesterol is 127 mg percent.  Vitals:   03/13/20 0423 03/13/20 0746 03/13/20 1204 03/13/20 1610  BP: (!) 148/78 134/74 (!) 126/98 (!) 154/89  Pulse:  (!) 59 63 60  Resp: 16 16 16 16   Temp: 97.8 F (36.6 C) 97.9 F (36.6 C) 97.9 F (36.6 C) 97.7 F (36.5 C)  TempSrc: Oral Oral Oral Oral  SpO2: 99% 100% 100% 100%  Weight:      Height:        CBC:  Recent Labs  Lab 03/12/20 0458 03/12/20 0508  WBC 6.1  --   NEUTROABS 3.9  --   HGB 13.9 13.6  HCT 44.3 40.0  MCV 97.8  --   PLT 196  --     Basic Metabolic Panel:  Recent Labs  Lab 03/12/20 0458 03/12/20 0508  NA 136 138  K 5.4* 5.1  CL 105 105  CO2 21*  --   GLUCOSE 103* 102*  BUN 21 28*  CREATININE 1.11 1.10  CALCIUM 9.1  --    Lipid Panel:     Component Value Date/Time   CHOL 197 03/13/2020 0500   TRIG 97 03/13/2020 0500   HDL 51 03/13/2020 0500   CHOLHDL 3.9 03/13/2020 0500   VLDL 19 03/13/2020 0500   LDLCALC 127 (H) 03/13/2020 0500   HgbA1c: No results found for: HGBA1C Urine Drug Screen: No results found for: LABOPIA, COCAINSCRNUR, LABBENZ, AMPHETMU, THCU, LABBARB  Alcohol Level     Component Value Date/Time   ETH <10 12/16/2017 1003    IMAGING past 24 hours No results found.  PHYSICAL EXAM [name] not in distress.  Slightly hard of hearing. . Afebrile. Head is nontraumatic. Neck is supple without bruit.    Cardiac exam no murmur or gallop.  Lungs are clear to auscultation. Distal pulses are well felt. Neurological Exam :  Awake alert oriented to time place and person.  Speech slightly dysarthric.  Language is normal.  Follows commands well.  Slight decrease hearing bilaterally.  Extraocular movements full range without nystagmus.  Blinks to threat bilaterally.  No visual field deficit .  Slight right lower facial asymmetry when he smiles.  Tongue midline.  Motor system exam no upper or lower extremity drift but mild right grip weakness.  Diminished fine finger movements on the right.  Orbits left over right upper extremity.  Mild weakness of right hip flexors on simultaneous lower extremity testing.  Sensation intact.  Coordination is slow but accurate.  Gait not tested.  NIH stroke scale score 2.  Premorbid modified Rankin score 0 ASSESSMENT/PLAN Oscar [name] is a 86 y.o. male with history of bladder tumor, cervical stenosis, concussions x 2, HLD and loop recorder placement presenting with R sided weakness.    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 Small vessel disease. Atrophy. ASPECTS 10.     CTA head & neck no  LVO. Aortic arch and ICA bifurcation atherosclerosis. Emphysema.   CT perfusion negative   MRI  L PLIC infarct extending into corona radiata. Small vessel disease. Atrophy.   2D Echo EF 55-60%. No source of embolus   LDL 127  HgbA1c pending   Lovenox 40 mg sq daily for VTE prophylaxis  aspirin 81 mg daily prior to admission, now on aspirin 81 mg daily and clopidogrel 75 mg daily. Continue DAPT x 3 weeks then Plavix alone. Do not recommend [facility] unless an embolic source is clearly identified. Consider PACIFIC-STROKE research study.   Therapy recommendations:  HH OT  Disposition:  pending   Hx possible AF but not documented.   Hypertension  Stable . Permissive hypertension (OK if < 220/120) but gradually normalize in 5-7 days . Long-term BP  goal normotensive  Hyperlipidemia  Home meds:  No statin  Now on lipitor 40  LDL 127, goal < 70  Continue statin at discharge  Other Stroke Risk Factors  Advanced age  Former Cigarette smoker, quit 41 yrs ago  ETOH use, alcohol level <10, advised to drink no more than 2 drink(s) a day  Family hx stroke (father)  Other Active Problems  LBBB s/p loop 2016  Hx bladder tumor  Hospital day # 1  Patient presented with right-sided weakness secondary to left posterior limb internal capsule infarct from small vessel disease.  He remains at risk for recurrent strokes.  Recommend dual antiplatelet therapy and aggressive risk factor modification and continue ongoing stroke work-up.  Patient may also benefit with possible participation in the [employer] stroke prevention study of factor XI oral inhibitor.  I have given him written information to review and decide. Greater than 50% time during this 35-minute visit was spent on counseling and coordination of care about his stroke and answering questions about stroke evaluation, treatment and prevention. Oscar Contras, MD To contact Stroke Continuity provider, please refer to [email or web address] After hours, contact General Neurology

## 2020-03-13 NOTE — Evaluation (Signed)
Occupational Therapy Evaluation Patient Details Name: Oscar [name] MRN: [id number] DOB: 12-09-1930 Today's Date: 03/13/2020    History of Present Illness Patient is 86 year old male with history of hyperlipidemia, hypothyroidism, left bundle branch block with loop recorder who presented with weakness on the right side including upper and lower extremities and slurred speech.  He has chronic mild expressive aphasia.  Also found to have right facial droop.  Head CT was negative for any acute intracranial abnormalities.  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   Clinical Impression   This 86 yo male admitted with above presents to acute OT with PLOF of being totally independent with all basic ADLs, doing some IADLs, and driving. Currently he is min A when up on his feet moving, min guard A when standing (ie: at sink to groom), has decreased use/strength of RUE/RLE all affecting his safety and independence with basic ADLs. He will benefit from acute OT with follow up [place] and 24 hour S (with someone with him anytime he is up on his feet).    Follow Up Recommendations  Home health OT;Supervision/Assistance - 24 hour    Equipment Recommendations  Tub/shower seat       Precautions / Restrictions Precautions Precautions: Fall      Mobility Bed Mobility Overal bed mobility: Needs Assistance Bed Mobility: Supine to Sit     Supine to sit: Min guard        Transfers Overall transfer level: Needs assistance Equipment used: 1 person hand held assist Transfers: Sit to/from Stand Sit to Stand: Min assist         General transfer comment: stability assist    Balance Overall balance assessment: Needs assistance Sitting-balance support: No upper extremity supported Sitting balance-Leahy Scale: Fair     Standing balance support: No upper extremity supported Standing balance-Leahy Scale:  Fair Standing balance comment: stable staticall, but needed assist dynamically               High Level Balance Comments: safer holding to the iv pole to look over each shoulder, w/shifting to the right not as complete.           ADL either performed or assessed with clinical judgement   ADL Overall ADL's : Needs assistance/impaired Eating/Feeding: Set up;Sitting   Grooming: Min guard;Standing;Wash/dry face   Upper Body Bathing: Set up;Supervision/ safety;Sitting   Lower Body Bathing: Min guard;Sit to/from stand   Upper Body Dressing : Supervision/safety;Set up;Sitting   Lower Body Dressing: Min guard;Sit to/from stand   Toilet Transfer: Minimal assistance;Ambulation Toilet Transfer Details (indicate cue type and reason): No AD, bed>out door>back into room>stand at sink to wash face, to recliner on other side of bed Toileting- Clothing Manipulation and Hygiene: Min guard;Sit to/from stand         General ADL Comments: Attempts to use RUE for basic ADLs with decreased coordination noted     Vision Patient Visual Report: No change from baseline              Pertinent Vitals/Pain Pain Assessment: No/denies pain     Hand Dominance Right   Extremity/Trunk Assessment Upper Extremity Assessment Upper Extremity Assessment: RUE deficits/detail RUE Deficits / Details: Mild lag compared to LUE with shoulder flexion, difficulty with finger to nose, did fine with finger opposition RUE Sensation: decreased light touch RUE Coordination: decreased fine motor;decreased gross motor   Lower Extremity Assessment Lower Extremity Assessment: RLE  deficits/detail;LLE deficits/detail       Communication Communication Communication: No difficulties   Cognition Arousal/Alertness: Awake/alert Behavior During Therapy: WFL for tasks assessed/performed Overall Cognitive Status: Within Functional Limits for tasks assessed                                                 Home Living Family/patient expects to be discharged to:: Private residence Living Arrangements: Spouse/significant other;Children Available Help at Discharge: Family;Available 24 hours/day Type of Home: House Home Access: Stairs to enter [employer] of Steps: 2 Entrance Stairs-Rails: Left Home Layout: Two level;Able to live on main level with bedroom/bathroom     Bathroom Shower/Tub: Occupational psychologist: Standard     Home Equipment: Environmental consultant - 2 wheels;Cane - single point          Prior Functioning/Environment Level of Independence: Independent        Comments: was driving pta        OT Problem List: Decreased strength;Impaired balance (sitting and/or standing);Impaired UE functional use      OT Treatment/Interventions: Self-care/ADL training;Therapeutic exercise;Therapeutic activities;DME and/or AE instruction;Patient/family education;Balance training    OT Goals(Current goals can be found in the care plan section) Acute Rehab OT Goals Patient Stated Goal: to go home OT Goal Formulation: With patient Time For Goal Achievement: 03/27/20 Potential to Achieve Goals: Good  OT Frequency: Min 2X/week           Co-evaluation PT/OT/SLP Co-Evaluation/Treatment: Yes Reason for Co-Treatment: To address functional/ADL transfers;For patient/therapist safety PT goals addressed during session: Mobility/safety with mobility OT goals addressed during session: ADL's and self-care      AM-PAC OT "6 Clicks" Daily Activity     Outcome Measure Help from another person eating meals?: None Help from another person taking care of personal grooming?: A Little Help from another person toileting, which includes using toliet, bedpan, or urinal?: A Little Help from another person bathing (including washing, rinsing, drying)?: A Little Help from another person to put on and taking off regular upper body clothing?: A Little Help from another person to  put on and taking off regular lower body clothing?: A Little 6 Click Score: 19   End of Session    Activity Tolerance: Patient tolerated treatment well Patient left: in chair;with call bell/phone within reach;with chair alarm set  OT Visit Diagnosis: Unsteadiness on feet (R26.81);Other abnormalities of gait and mobility (R26.89);Muscle weakness (generalized) (M62.81);Hemiplegia and hemiparesis Hemiplegia - Right/Left: Right Hemiplegia - dominant/non-dominant: Dominant Hemiplegia - caused by: Cerebral infarction                Time: 1201-1227 OT Time Calculation (min): 26 min Charges:  OT General Charges $OT Visit: 1 Visit OT Evaluation $OT Eval Moderate Complexity: 1 Mod  [address], OTR/L Acute [employer] Pager [phone] Office [phone]     Almon Register 03/13/2020, 3:07 PM

## 2020-03-13 NOTE — Progress Notes (Signed)
PROGRESS NOTE    Oscar [name]  XQJ:[id number] DOB: 1931-09-04 DOA: 03/12/2020 PCP: Berkley Harvey, NP   Brief Narrative:  Patient is 86 year old male with history of hyperlipidemia, hypothyroidism, left bundle branch block with loop recorder who presented with weakness on the right side including upper and lower extremities and slurred speech.  He has chronic mild expressive aphasia.  Also found to have right facial droop.  Head CT was negative for any acute intracranial abnormalities.  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  Neurology consulted.  Started stroke work-up.  Assessment & Plan:   Principal Problem:   CVA (cerebral vascular accident) ([place]) Active Problems:   Atrial tachycardia, paroxysmal ([facility])   Mixed hyperlipidemia   Acute CVA (cerebrovascular accident) ([place])   Hypothyroidism (acquired)   Essential hypertension   Acute ischemic stroke: Presented with acute onset of right-sided upper and lower extremity weakness, dysarthria.  MRI as above.  Stroke work-up initiated. CTA head and neck did not show any large vessel occlusion.  Echocardiogram showed ejection fraction of 55 to 60%, no cardiac source of emboli, left ventricular hypertrophy, grade 1 diastolic dysfunction, no atrial level shunt. Hemoglobin A1C is pending.  LDL of 127. Started on aspirin and Plavix.  Continue statin PT/OT evaluation pending. Patient is independent and does not have any gait problems at baseline.  Hypertension: Blood pressure currently stable.  Allow permissive hypertension.  Continue as needed meds for severe hypertension.  On Toprol at home which is on hold, will resume in 24 to 48 hours.  Hyperlipidemia: LDL of 127.  Continue Lipitor 40 mg daily  Suspected Afib: Rate controlled with Toprol.  There was suspicion for possible atrial fibrillation in the past but not clearly documented in cardiology  notes.  He was following with Dr. Geraldo Pitter in the hospital and was also on loop recorder placement but was discontinued without complete workup because patient didn't want it. EKG on presentation did not show any atrial fibrillation.  Currently he is in normal sinus rhythm.  Will check with neurology if he needs loop recorder placement or needs to be started on anticoagulation.  Hypothyroidism: Continue levothyroxine.  He had history of thyroiditis in the past            DVT prophylaxis: Lovenox Code Status: Full Family Communication: Called spouse on phone on 03/13/20,call not received Status is: Inpatient  Remains inpatient appropriate because:Ongoing diagnostic testing needed not appropriate for outpatient work up   Dispo: The patient is from: Home              Anticipated d/c is to: SNF              Anticipated d/c date is: 2 days              Patient currently is not medically stable to d/c.     Consultants: Neurology  Procedures:None  Antimicrobials:  Anti-infectives (From admission, onward)   None      Subjective:  Patient seen and examined at the bedside this morning.  Hemodynamically stable.  Alert and oriented.  Weakness on the right upper extremity and lower extremity have improved today Objective: Vitals:   03/12/20 1949 03/12/20 2334 03/13/20 0423 03/13/20 0746  BP: 140/79 140/69 (!) 148/78 134/74  Pulse: (!) 56 (!) 55  (!) 59  Resp: 18 18 16 16   Temp: 98.1 F (36.7 C) 97.8 F (36.6 C) 97.8 F (  36.6 C) 97.9 F (36.6 C)  TempSrc: Oral Oral Oral Oral  SpO2: 99% 97% 99% 100%  Weight:      Height:        Intake/Output Summary (Last 24 hours) at 03/13/2020 0755 Last data filed at 03/13/2020 0600 Gross per 24 hour  Intake 905.17 ml  Output 1050 ml  Net -144.83 ml   Filed Weights   03/12/20 0540  Weight: 70.5 kg    Examination:  General exam: Appears calm and comfortable ,Not in distress, pleasant elderly gentleman HEENT:PERRL,Oral mucosa  moist, Ear/Nose normal on gross exam Respiratory system: Bilateral equal air entry, normal vesicular breath sounds, no wheezes or crackles  Cardiovascular system: S1 & S2 heard, RRR. No JVD, murmurs, rubs, gallops or clicks. No pedal edema. Gastrointestinal system: Abdomen is nondistended, soft and nontender. No organomegaly or masses felt. Normal bowel sounds heard. Central nervous system: Alert and oriented.  Weakness on the right side with motor of 4/ 5 on right upper and lower extremity, mild right facial droop Extremities: No edema, no clubbing ,no cyanosis Skin: No rashes, lesions or ulcers,no icterus ,no pallor    Data Reviewed: I have personally reviewed following labs and imaging studies  CBC: Recent Labs  Lab 03/12/20 0458 03/12/20 0508  WBC 6.1  --   NEUTROABS 3.9  --   HGB 13.9 13.6  HCT 44.3 40.0  MCV 97.8  --   PLT 196  --    Basic Metabolic Panel: Recent Labs  Lab 03/12/20 0458 03/12/20 0508  NA 136 138  K 5.4* 5.1  CL 105 105  CO2 21*  --   GLUCOSE 103* 102*  BUN 21 28*  CREATININE 1.11 1.10  CALCIUM 9.1  --    GFR: Estimated Creatinine Clearance: 44.2 mL/min (by C-G formula based on SCr of 1.1 mg/dL). Liver Function Tests: Recent Labs  Lab 03/12/20 0458  AST 30  ALT 10  ALKPHOS 52  BILITOT 1.0  PROT 6.0*  ALBUMIN 3.6   No results for input(s): LIPASE, AMYLASE in the last 168 hours. No results for input(s): AMMONIA in the last 168 hours. Coagulation Profile: Recent Labs  Lab 03/12/20 0458  INR 1.0   Cardiac Enzymes: No results for input(s): CKTOTAL, CKMB, CKMBINDEX, TROPONINI in the last 168 hours. BNP (last 3 results) No results for input(s): PROBNP in the last 8760 hours. HbA1C: No results for input(s): HGBA1C in the last 72 hours. CBG: Recent Labs  Lab 03/12/20 0450  GLUCAP 100*   Lipid Profile: Recent Labs    03/13/20 0500  CHOL 197  HDL 51  LDLCALC 127*  TRIG 97  CHOLHDL 3.9   Thyroid Function Tests: Recent Labs     03/12/20 0946  TSH 2.883   Anemia Panel: No results for input(s): VITAMINB12, FOLATE, FERRITIN, TIBC, IRON, RETICCTPCT in the last 72 hours. Sepsis Labs: No results for input(s): PROCALCITON, LATICACIDVEN in the last 168 hours.  Recent Results (from the past 240 hour(s))  SARS Coronavirus 2 by RT PCR (hospital order, performed in [facility] hospital lab) Nasopharyngeal Nasopharyngeal Swab     Status: None   Collection Time: 03/12/20  5:21 AM   Specimen: Nasopharyngeal Swab  Result Value Ref Range Status   SARS Coronavirus 2 NEGATIVE NEGATIVE Final    Comment: (NOTE) SARS-CoV-2 target nucleic acids are NOT DETECTED.  The SARS-CoV-2 RNA is generally detectable in upper and lower respiratory specimens during the acute phase of infection. The lowest concentration of SARS-CoV-2 viral copies this  assay can detect is 250 copies / mL. A negative result does not preclude SARS-CoV-2 infection and should not be used as the sole basis for treatment or other patient management decisions.  A negative result may occur with improper specimen collection / handling, submission of specimen other than nasopharyngeal swab, presence of viral mutation(s) within the areas targeted by this assay, and inadequate number of viral copies (<250 copies / mL). A negative result must be combined with clinical observations, patient history, and epidemiological information.  Fact Sheet for Patients:   StrictlyIdeas.no  Fact Sheet for Healthcare Providers: BankingDealers.co.za  This test is not yet approved or  cleared by the [place] FDA and has been authorized for detection and/or diagnosis of SARS-CoV-2 by FDA under an Emergency Use Authorization (EUA).  This EUA will remain in effect (meaning this test can be used) for the duration of the COVID-19 declaration under Section 564(b)(1) of the Act, 21 U.S.C. section 360bbb-3(b)(1), unless the authorization is  terminated or revoked sooner.  Performed at [facility], [place] [address], [place], [place] [address]          Radiology Studies: CT Code Stroke CTA Head W/WO contrast  Result Date: 03/12/2020 CLINICAL DATA:  Initial evaluation for acute dysarthria, right-sided weakness. EXAM: CT ANGIOGRAPHY HEAD AND NECK CT PERFUSION BRAIN TECHNIQUE: Multidetector CT imaging of the head and neck was performed using the standard protocol during bolus administration of intravenous contrast. Multiplanar CT image reconstructions and MIPs were obtained to evaluate the vascular anatomy. Carotid stenosis measurements (when applicable) are obtained utilizing NASCET criteria, using the distal internal carotid diameter as the denominator. Multiphase CT imaging of the brain was performed following IV bolus contrast injection. Subsequent parametric perfusion maps were calculated using RAPID software. CONTRAST:  172mL OMNIPAQUE IOHEXOL 350 MG/ML SOLN COMPARISON:  Comparison made with prior head CT from earlier the same day. FINDINGS: CTA NECK FINDINGS Aortic arch: Visualized aortic arch of normal caliber with normal 3 vessel morphology. Moderate atherosclerotic change about the arch and origin of the great vessels without flow-limiting stenosis. Right carotid system: Right common carotid artery widely patent from its origin to the bifurcation without stenosis. Eccentric calcified plaque at the right bifurcation/proximal right ICA without hemodynamically significant stenosis. Right ICA widely patent distally to the skull base without stenosis, dissection or occlusion. Left carotid system: Left CCA mildly tortuous but widely patent to the bifurcation without stenosis. Eccentric calcified plaque at the left bifurcation/proximal left ICA without hemodynamically significant stenosis. Left ICA widely patent distally to the skull base without stenosis, dissection or occlusion. Vertebral arteries: Both vertebral arteries arise from  the subclavian arteries. No proximal subclavian artery stenosis. Vertebral arteries mildly tortuous proximally but are widely patent to the skull base without stenosis, dissection or occlusion. Skeleton: No visible acute osseous abnormality. Patient status post prior fusion at C4-C6, with anterior plate fixation at I2-9. No discrete or worrisome osseous lesions. Other neck: No other acute soft tissue abnormality within the neck. Upper chest: Visualized upper chest demonstrates no acute finding. Centrilobular emphysematous changes noted within the visualized lungs. Review of the MIP images confirms the above findings CTA HEAD FINDINGS Anterior circulation: Both internal carotid arteries patent to the termini without flow-limiting stenosis. A1 segments patent bilaterally. Normal anterior communicating artery complex. Anterior cerebral arteries patent to their distal aspects without stenosis. No M1 stenosis or occlusion. Normal MCA bifurcations. No proximal M2 branch occlusion. Distal MCA branches well perfused and symmetric. Posterior circulation: Vertebral arteries patent to the  vertebrobasilar junction without stenosis. Both picas patent. Basilar patent to its distal aspect without stenosis. Superior cerebral arteries patent bilaterally. Both PCAs primarily supplied via the basilar nerve well perfused to their distal aspects without appreciable stenosis. Venous sinuses: Grossly patent allowing for timing the contrast bolus. Anatomic variants: None significant.  No intracranial aneurysm. Review of the MIP images confirms the above findings CT Brain Perfusion Findings: ASPECTS: 10. CBF (<30%) Volume: 19mL Perfusion (Tmax>6.0s) volume: 15mL Mismatch Volume: 11mL Infarction Location:Negative CT perfusion with no evidence for acute core infarct or other perfusion deficit. IMPRESSION: CTA HEAD AND NECK IMPRESSION: 1. Negative CTA for emergent large vessel occlusion. 2. Mild-to-moderate atheromatous change about the aortic  arch and carotid bifurcations without hemodynamically significant stenosis. 3.  Emphysema (ICD10-J43.9). CT PERFUSION IMPRESSION: Negative CT perfusion. No evidence for acute core infarct or other perfusion deficit. Critical Value/emergent results were called by telephone at the time of interpretation on 03/12/2020 at 5:30 am to provider ERIC [facility] , who verbally acknowledged these results. Electronically Signed   By: Jeannine Boga M.D.   On: 03/12/2020 06:17   CT Code Stroke CTA Neck W/WO contrast  Result Date: 03/12/2020 CLINICAL DATA:  Initial evaluation for acute dysarthria, right-sided weakness. EXAM: CT ANGIOGRAPHY HEAD AND NECK CT PERFUSION BRAIN TECHNIQUE: Multidetector CT imaging of the head and neck was performed using the standard protocol during bolus administration of intravenous contrast. Multiplanar CT image reconstructions and MIPs were obtained to evaluate the vascular anatomy. Carotid stenosis measurements (when applicable) are obtained utilizing NASCET criteria, using the distal internal carotid diameter as the denominator. Multiphase CT imaging of the brain was performed following IV bolus contrast injection. Subsequent parametric perfusion maps were calculated using RAPID software. CONTRAST:  145mL OMNIPAQUE IOHEXOL 350 MG/ML SOLN COMPARISON:  Comparison made with prior head CT from earlier the same day. FINDINGS: CTA NECK FINDINGS Aortic arch: Visualized aortic arch of normal caliber with normal 3 vessel morphology. Moderate atherosclerotic change about the arch and origin of the great vessels without flow-limiting stenosis. Right carotid system: Right common carotid artery widely patent from its origin to the bifurcation without stenosis. Eccentric calcified plaque at the right bifurcation/proximal right ICA without hemodynamically significant stenosis. Right ICA widely patent distally to the skull base without stenosis, dissection or occlusion. Left carotid system: Left CCA mildly  tortuous but widely patent to the bifurcation without stenosis. Eccentric calcified plaque at the left bifurcation/proximal left ICA without hemodynamically significant stenosis. Left ICA widely patent distally to the skull base without stenosis, dissection or occlusion. Vertebral arteries: Both vertebral arteries arise from the subclavian arteries. No proximal subclavian artery stenosis. Vertebral arteries mildly tortuous proximally but are widely patent to the skull base without stenosis, dissection or occlusion. Skeleton: No visible acute osseous abnormality. Patient status post prior fusion at C4-C6, with anterior plate fixation at Z1-2. No discrete or worrisome osseous lesions. Other neck: No other acute soft tissue abnormality within the neck. Upper chest: Visualized upper chest demonstrates no acute finding. Centrilobular emphysematous changes noted within the visualized lungs. Review of the MIP images confirms the above findings CTA HEAD FINDINGS Anterior circulation: Both internal carotid arteries patent to the termini without flow-limiting stenosis. A1 segments patent bilaterally. Normal anterior communicating artery complex. Anterior cerebral arteries patent to their distal aspects without stenosis. No M1 stenosis or occlusion. Normal MCA bifurcations. No proximal M2 branch occlusion. Distal MCA branches well perfused and symmetric. Posterior circulation: Vertebral arteries patent to the vertebrobasilar junction without stenosis. Both picas patent. Basilar patent  to its distal aspect without stenosis. Superior cerebral arteries patent bilaterally. Both PCAs primarily supplied via the basilar nerve well perfused to their distal aspects without appreciable stenosis. Venous sinuses: Grossly patent allowing for timing the contrast bolus. Anatomic variants: None significant.  No intracranial aneurysm. Review of the MIP images confirms the above findings CT Brain Perfusion Findings: ASPECTS: 10. CBF (<30%)  Volume: 18mL Perfusion (Tmax>6.0s) volume: 93mL Mismatch Volume: 33mL Infarction Location:Negative CT perfusion with no evidence for acute core infarct or other perfusion deficit. IMPRESSION: CTA HEAD AND NECK IMPRESSION: 1. Negative CTA for emergent large vessel occlusion. 2. Mild-to-moderate atheromatous change about the aortic arch and carotid bifurcations without hemodynamically significant stenosis. 3.  Emphysema (ICD10-J43.9). CT PERFUSION IMPRESSION: Negative CT perfusion. No evidence for acute core infarct or other perfusion deficit. Critical Value/emergent results were called by telephone at the time of interpretation on 03/12/2020 at 5:30 am to provider ERIC [facility] , who verbally acknowledged these results. Electronically Signed   By: Jeannine Boga M.D.   On: 03/12/2020 06:17   MR BRAIN WO CONTRAST  Result Date: 03/12/2020 CLINICAL DATA:  Acute dysarthria and right-sided weakness. EXAM: MRI HEAD WITHOUT CONTRAST TECHNIQUE: Multiplanar, multiecho pulse sequences of the brain and surrounding structures were obtained without intravenous contrast. COMPARISON:  CT head without contrast, CTA head and neck, and CT perfusion 03/12/2020 FINDINGS: Brain: A 13 mm linear acute nonhemorrhagic infarct is evident within the posterior limb of the left internal capsule, extending into the corona radiata. T2 signal changes are associated. Periventricular and scattered subcortical T2 hyperintensities are otherwise stable. Mild generalized atrophy is stable. The ventricles are proportionate to the degree of atrophy. No significant extraaxial fluid collection is present. Vascular: Flow is present in the major intracranial arteries. Bilateral lens replacements are noted. Globes and orbits are otherwise unremarkable. Skull and upper cervical spine: Degenerative changes are noted with fusion across the disc space at C3-4. Uncovertebral spurring contributes to foraminal narrowing. Diffuse fatty infiltration of the marrow  is within normal limits for age. Craniocervical junction is otherwise normal. Sinuses/Orbits: The paranasal sinuses and mastoid air cells are clear. Bilateral lens replacements are noted. Globes and orbits are otherwise unremarkable. IMPRESSION: 1. 13 mm acute nonhemorrhagic infarct involving the posterior limb of the left internal capsule, extending into the corona radiata. This corresponds with the patient's acute symptoms. 2. Atrophy and mild white matter disease is otherwise within normal limits for age. Electronically Signed   By: San Morelle M.D.   On: 03/12/2020 11:22   CT Code Stroke Cerebral Perfusion with contrast  Result Date: 03/12/2020 CLINICAL DATA:  Initial evaluation for acute dysarthria, right-sided weakness. EXAM: CT ANGIOGRAPHY HEAD AND NECK CT PERFUSION BRAIN TECHNIQUE: Multidetector CT imaging of the head and neck was performed using the standard protocol during bolus administration of intravenous contrast. Multiplanar CT image reconstructions and MIPs were obtained to evaluate the vascular anatomy. Carotid stenosis measurements (when applicable) are obtained utilizing NASCET criteria, using the distal internal carotid diameter as the denominator. Multiphase CT imaging of the brain was performed following IV bolus contrast injection. Subsequent parametric perfusion maps were calculated using RAPID software. CONTRAST:  164mL OMNIPAQUE IOHEXOL 350 MG/ML SOLN COMPARISON:  Comparison made with prior head CT from earlier the same day. FINDINGS: CTA NECK FINDINGS Aortic arch: Visualized aortic arch of normal caliber with normal 3 vessel morphology. Moderate atherosclerotic change about the arch and origin of the great vessels without flow-limiting stenosis. Right carotid system: Right common carotid artery widely patent from its  origin to the bifurcation without stenosis. Eccentric calcified plaque at the right bifurcation/proximal right ICA without hemodynamically significant stenosis.  Right ICA widely patent distally to the skull base without stenosis, dissection or occlusion. Left carotid system: Left CCA mildly tortuous but widely patent to the bifurcation without stenosis. Eccentric calcified plaque at the left bifurcation/proximal left ICA without hemodynamically significant stenosis. Left ICA widely patent distally to the skull base without stenosis, dissection or occlusion. Vertebral arteries: Both vertebral arteries arise from the subclavian arteries. No proximal subclavian artery stenosis. Vertebral arteries mildly tortuous proximally but are widely patent to the skull base without stenosis, dissection or occlusion. Skeleton: No visible acute osseous abnormality. Patient status post prior fusion at C4-C6, with anterior plate fixation at F7-4. No discrete or worrisome osseous lesions. Other neck: No other acute soft tissue abnormality within the neck. Upper chest: Visualized upper chest demonstrates no acute finding. Centrilobular emphysematous changes noted within the visualized lungs. Review of the MIP images confirms the above findings CTA HEAD FINDINGS Anterior circulation: Both internal carotid arteries patent to the termini without flow-limiting stenosis. A1 segments patent bilaterally. Normal anterior communicating artery complex. Anterior cerebral arteries patent to their distal aspects without stenosis. No M1 stenosis or occlusion. Normal MCA bifurcations. No proximal M2 branch occlusion. Distal MCA branches well perfused and symmetric. Posterior circulation: Vertebral arteries patent to the vertebrobasilar junction without stenosis. Both picas patent. Basilar patent to its distal aspect without stenosis. Superior cerebral arteries patent bilaterally. Both PCAs primarily supplied via the basilar nerve well perfused to their distal aspects without appreciable stenosis. Venous sinuses: Grossly patent allowing for timing the contrast bolus. Anatomic variants: None significant.  No  intracranial aneurysm. Review of the MIP images confirms the above findings CT Brain Perfusion Findings: ASPECTS: 10. CBF (<30%) Volume: 61mL Perfusion (Tmax>6.0s) volume: 60mL Mismatch Volume: 7mL Infarction Location:Negative CT perfusion with no evidence for acute core infarct or other perfusion deficit. IMPRESSION: CTA HEAD AND NECK IMPRESSION: 1. Negative CTA for emergent large vessel occlusion. 2. Mild-to-moderate atheromatous change about the aortic arch and carotid bifurcations without hemodynamically significant stenosis. 3.  Emphysema (ICD10-J43.9). CT PERFUSION IMPRESSION: Negative CT perfusion. No evidence for acute core infarct or other perfusion deficit. Critical Value/emergent results were called by telephone at the time of interpretation on 03/12/2020 at 5:30 am to provider ERIC [facility] , who verbally acknowledged these results. Electronically Signed   By: Jeannine Boga M.D.   On: 03/12/2020 06:17   ECHOCARDIOGRAM COMPLETE  Result Date: 03/12/2020    ECHOCARDIOGRAM REPORT   Patient Name:   NICKY KRAS Date of Exam: 03/12/2020 Medical Rec #:  [id number]       Height:       67.0 in Accession #:    [id number]      Weight:       155.4 lb Date of Birth:  1931-08-02        BSA:          1.817 m Patient Age:    88 years        BP:           172/92 mmHg Patient Gender: M               HR:           66 bpm. Exam Location:  Inpatient Procedure: 2D Echo and Intracardiac Opacification Agent Indications:    Stroke 434.91 / I163.9  History:        Patient has prior history of  Echocardiogram examinations, most                 recent 06/29/2019. Arrythmias:LBBB and Atrial tachycardia; Risk                 Factors:Dyslipidemia and Hypertension. Thyroid disease. GERD.  Sonographer:    Darlina Sicilian RDCS Referring Phys: [place]  1. Left ventricular ejection fraction, by estimation, is 55 to 60%. The left ventricle has normal function. The left ventricle has no regional wall motion  abnormalities. There is moderate concentric left ventricular hypertrophy. Left ventricular diastolic parameters are consistent with Grade I diastolic dysfunction (impaired relaxation). Elevated left atrial pressure.  2. Right ventricular systolic function is normal. The right ventricular size is normal. There is normal pulmonary artery systolic pressure.  3. The mitral valve is normal in structure. Mild mitral valve regurgitation. No evidence of mitral stenosis.  4. The aortic valve is normal in structure. Aortic valve regurgitation is not visualized. Mild to moderate aortic valve sclerosis/calcification is present, without any evidence of aortic stenosis.  5. Aortic dilatation noted. There is mild dilatation of the ascending aorta measuring 41 mm.  6. The inferior vena cava is normal in size with greater than 50% respiratory variability, suggesting right atrial pressure of 3 mmHg. Conclusion(s)/Recommendation(s): No intracardiac source of embolism detected on this transthoracic study. A transesophageal echocardiogram is recommended to exclude cardiac source of embolism if clinically indicated. FINDINGS  Left Ventricle: Left ventricular ejection fraction, by estimation, is 55 to 60%. The left ventricle has normal function. The left ventricle has no regional wall motion abnormalities. Definity contrast agent was given IV to delineate the left ventricular  endocardial borders. The left ventricular internal cavity size was normal in size. There is moderate concentric left ventricular hypertrophy. Left ventricular diastolic parameters are consistent with Grade I diastolic dysfunction (impaired relaxation). Elevated left atrial pressure. Right Ventricle: The right ventricular size is normal. No increase in right ventricular wall thickness. Right ventricular systolic function is normal. There is normal pulmonary artery systolic pressure. The tricuspid regurgitant velocity is 2.02 m/s, and  with an assumed right atrial  pressure of 3 mmHg, the estimated right ventricular systolic pressure is 25.8 mmHg. Left Atrium: Left atrial size was normal in size. Right Atrium: Right atrial size was normal in size. Pericardium: There is no evidence of pericardial effusion. Mitral Valve: The mitral valve is normal in structure. Normal mobility of the mitral valve leaflets. Mild mitral valve regurgitation. No evidence of mitral valve stenosis. Tricuspid Valve: The tricuspid valve is normal in structure. Tricuspid valve regurgitation is mild . No evidence of tricuspid stenosis. Aortic Valve: The aortic valve is normal in structure. Aortic valve regurgitation is not visualized. Mild to moderate aortic valve sclerosis/calcification is present, without any evidence of aortic stenosis. Pulmonic Valve: The pulmonic valve was normal in structure. Pulmonic valve regurgitation is not visualized. No evidence of pulmonic stenosis. Aorta: Aortic dilatation noted. There is mild dilatation of the ascending aorta measuring 41 mm. Venous: The inferior vena cava is normal in size with greater than 50% respiratory variability, suggesting right atrial pressure of 3 mmHg. IAS/Shunts: No atrial level shunt detected by color flow Doppler.  LEFT VENTRICLE PLAX 2D LVIDd:         3.70 cm     Diastology LVIDs:         2.60 cm     LV e' lateral:   4.68 cm/s LV PW:  1.20 cm     LV E/e' lateral: 14.9 LV IVS:        1.40 cm     LV e' medial:    3.37 cm/s LVOT diam:     1.90 cm     LV E/e' medial:  20.7 LV SV:         63 LV SV Index:   35 LVOT Area:     2.84 cm  LV Volumes (MOD) LV vol d, MOD A2C: 82.7 ml LV vol d, MOD A4C: 96.0 ml LV vol s, MOD A2C: 56.4 ml LV vol s, MOD A4C: 43.2 ml LV SV MOD A2C:     26.3 ml LV SV MOD A4C:     96.0 ml LV SV MOD BP:      39.3 ml RIGHT VENTRICLE RV S prime:     10.90 cm/s TAPSE (M-mode): 1.8 cm LEFT ATRIUM             Index       RIGHT ATRIUM           Index LA diam:        2.50 cm 1.38 cm/m  RA Area:     12.90 cm LA Vol (A2C):    37.9 ml 20.86 ml/m RA Volume:   25.90 ml  14.26 ml/m LA Vol (A4C):   17.8 ml 9.80 ml/m LA Biplane Vol: 26.2 ml 14.42 ml/m  AORTIC VALVE LVOT Vmax:   109.00 cm/s LVOT Vmean:  74.300 cm/s LVOT VTI:    0.222 m  AORTA Ao Root diam: 3.80 cm Ao Asc diam:  4.10 cm MITRAL VALVE                TRICUSPID VALVE MV Area (PHT): 3.45 cm     TR Peak grad:   16.3 mmHg MV Decel Time: 220 msec     TR Vmax:        202.00 cm/s MV E velocity: 69.80 cm/s MV A velocity: 104.00 cm/s  SHUNTS MV E/A ratio:  0.67         Systemic VTI:  0.22 m                             Systemic Diam: 1.90 cm Ena Dawley MD Electronically signed by Ena Dawley MD Signature Date/Time: 03/12/2020/3:59:31 PM    Final    CT HEAD CODE STROKE WO CONTRAST  Result Date: 03/12/2020 CLINICAL DATA:  Code stroke. Initial evaluation for acute weakness, aphasia. Stroke. EXAM: CT HEAD WITHOUT CONTRAST TECHNIQUE: Contiguous axial images were obtained from the base of the skull through the vertex without intravenous contrast. COMPARISON:  Prior MRI from 12/16/2017. FINDINGS: Brain: Age-related cerebral atrophy with mild chronic small vessel ischemic disease. No acute intracranial hemorrhage. No acute large vessel territory infarct. No mass lesion, midline shift or mass effect. No hydrocephalus or extra-axial fluid collection. Vascular: No asymmetric hyperdense vessel. Scattered vascular calcifications noted within the carotid siphons. Skull: Scalp soft tissues and calvarium within normal limits. Sinuses/Orbits: Globes and orbital soft tissues are normal. Paranasal sinuses mastoid air cells are clear. Other: None. ASPECTS [facility] Stroke [facility] Early CT Score) - Ganglionic level infarction (caudate, lentiform nuclei, internal capsule, insula, M1-M3 cortex): 7 - Supraganglionic infarction (M4-M6 cortex): 3 Total score (0-10 with 10 being normal): 10 IMPRESSION: 1. No acute intracranial infarct or other abnormality. 2. ASPECTS is 10. 3. Generalized age-related  cerebral atrophy with mild chronic small vessel ischemic  disease. These results were communicated to Dr. Cheral Marker at 5:12 amon 6/20/2021by text page via the [facility] messaging system. Electronically Signed   By: Jeannine Boga M.D.   On: 03/12/2020 05:13        Scheduled Meds: . aspirin  300 mg Rectal Daily   Or  . aspirin  325 mg Oral Daily  . atorvastatin  40 mg Oral Daily  . clopidogrel  75 mg Oral Daily  . enoxaparin (LOVENOX) injection  40 mg Subcutaneous Q24H  . olopatadine  1 drop Both Eyes BID  . pantoprazole  40 mg Oral Daily   Continuous Infusions: . sodium chloride 50 mL/hr at 03/13/20 0100     LOS: 1 day    Time spent: 35 mins,More than 50% of that time was spent in counseling and/or coordination of care.      Shelly Coss, MD [facility] P6/21/2021, 7:55 AM

## 2020-03-13 NOTE — Evaluation (Signed)
Physical Therapy Evaluation Patient Details Name: Oscar [name] MRN: [id number] DOB: Jan 05, 1931 Today's Date: 03/13/2020   History of Present Illness  Patient is 86 year old male with history of hyperlipidemia, hypothyroidism, left bundle branch block with loop recorder who presented with weakness on the right side including upper and lower extremities and slurred speech.  He has chronic mild expressive aphasia.  Also found to have right facial droop.  Head CT was negative for any acute intracranial abnormalities.  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  Clinical Impression  Pt admitted with/for s/s of stroke with right weakness and dizziness.  Pt needing min guard to min assist at time of evaluation.  Pt currently limited functionally due to the problems listed below.  (see problems list.)  Pt will benefit from PT to maximize function and safety to be able to get home safely with available assist.     Follow Up Recommendations Home health PT;Supervision/Assistance - 24 hour    Equipment Recommendations  None recommended by PT    Recommendations for Other Services       Precautions / Restrictions Precautions Precautions: Fall      Mobility  Bed Mobility Overal bed mobility: Needs Assistance Bed Mobility: Supine to Sit     Supine to sit: Min guard        Transfers Overall transfer level: Needs assistance Equipment used: None Transfers: Sit to/from Stand Sit to Stand: Min assist         General transfer comment: stability assist  Ambulation/Gait Ambulation/Gait assistance: Min assist   Assistive device: IV Pole;1 person hand held assist Gait Pattern/deviations: Step-through pattern;Decreased step length - right;Decreased step length - left;Decreased stance time - right;Decreased stride length Gait velocity: slower   General Gait Details: short, tentative and mildly  unsteady steps.  Stairs            Wheelchair Mobility    Modified Rankin (Stroke Patients Only) Modified Rankin (Stroke Patients Only) Pre-Morbid Rankin Score: No symptoms Modified Rankin: Slight disability     Balance Overall balance assessment: Needs assistance Sitting-balance support: No upper extremity supported Sitting balance-Leahy Scale: Fair     Standing balance support: No upper extremity supported Standing balance-Leahy Scale: Fair Standing balance comment: stable staticall, but needed assist dynamically               High Level Balance Comments: safer holding to the iv pole to look over each shoulder, w/shifting to the right not as complete.             Pertinent Vitals/Pain Pain Assessment: No/denies pain    Home Living Family/patient expects to be discharged to:: Private residence Living Arrangements: Spouse/significant other;Children Available Help at Discharge: Family;Available 24 hours/day Type of Home: House Home Access: Stairs to enter Entrance Stairs-Rails: Left Entrance Stairs-Number of Steps: 2 Home Layout: Two level;Able to live on main level with bedroom/bathroom Home Equipment: Gilford Rile - 2 wheels;Cane - single point      Prior Function Level of Independence: Independent         Comments: was driving pta     Hand Dominance   Dominant Hand: Right    Extremity/Trunk Assessment   Upper Extremity Assessment Upper Extremity Assessment: Defer to OT evaluation    Lower Extremity Assessment Lower Extremity Assessment: RLE deficits/detail;LLE deficits/detail RLE Deficits / Details: mild weakness and decreased coordination at hip flexors, quads, hams and df/pf, grossly 4/5 RLE Sensation:  (  pt reported not change in sensation as of eval) RLE Coordination: decreased fine motor LLE Deficits / Details: WFL       Communication   Communication: No difficulties  Cognition Arousal/Alertness: Awake/alert Behavior During  Therapy: WFL for tasks assessed/performed Overall Cognitive Status: Within Functional Limits for tasks assessed                                        General Comments      Exercises     Assessment/Plan    PT Assessment Patient needs continued PT services  PT Problem List Decreased strength;Decreased activity tolerance;Decreased balance;Decreased coordination;Decreased mobility;Decreased safety awareness;Impaired sensation       PT Treatment Interventions DME instruction;Gait training;Stair training;Functional mobility training;Therapeutic activities;Balance training;Neuromuscular re-education;Patient/family education    PT Goals (Current goals can be found in the Care Plan section)  Acute Rehab PT Goals Patient Stated Goal: to go home PT Goal Formulation: With patient Time For Goal Achievement: 03/27/20 Potential to Achieve Goals: Good    Frequency Min 3X/week   Barriers to discharge        Co-evaluation PT/OT/SLP Co-Evaluation/Treatment: Yes Reason for Co-Treatment: To address functional/ADL transfers;For patient/therapist safety PT goals addressed during session: Mobility/safety with mobility OT goals addressed during session: ADL's and self-care;Strengthening/ROM       AM-PAC PT "6 Clicks" Mobility  Outcome Measure Help needed turning from your back to your side while in a flat bed without using bedrails?: None Help needed moving from lying on your back to sitting on the side of a flat bed without using bedrails?: A Little Help needed moving to and from a bed to a chair (including a wheelchair)?: A Little Help needed standing up from a chair using your arms (e.g., wheelchair or bedside chair)?: A Little Help needed to walk in hospital room?: A Little Help needed climbing 3-5 steps with a railing? : A Little 6 Click Score: 19    End of Session   Activity Tolerance: Patient tolerated treatment well Patient left: in chair;with call bell/phone  within reach;with chair alarm set Nurse Communication: Mobility status PT Visit Diagnosis: Unsteadiness on feet (R26.81);Hemiplegia and hemiparesis Hemiplegia - Right/Left: Right Hemiplegia - dominant/non-dominant: Dominant Hemiplegia - caused by: Cerebral infarction    Time: 1201-1227 PT Time Calculation (min) (ACUTE ONLY): 26 min   Charges:   PT Evaluation $PT Eval Moderate Complexity: 1 Mod          03/13/2020  Ginger Carne., PT Acute Rehabilitation Services [phone]  (pager) [phone]  (office)  Tessie Fass Trent Theisen 03/13/2020, 6:08 PM

## 2020-03-13 NOTE — Progress Notes (Signed)
Nutrition Brief Note  RD consulted to assess pt after stroke. Pt reports eating 3 balanced meals per day PTA and states that weight has been stable -- reports fluctuating between 155-165 lbs.   Wt Readings from Last 15 Encounters:  03/12/20 70.5 kg  02/16/20 71.7 kg  02/12/20 70.3 kg  01/29/20 70.3 kg  01/24/20 72.6 kg  01/22/20 72.6 kg  01/07/20 73.5 kg  08/05/19 73.5 kg  07/13/19 72.6 kg  07/08/19 74.4 kg  07/06/19 74.4 kg  06/29/19 74.6 kg  12/04/18 74.4 kg  09/03/18 76.2 kg  08/06/18 72.6 kg    Body mass index is 24.34 kg/m. Patient meets criteria for normal based on current BMI.   Current diet order is heart healthy, patient is consuming approximately 100% of meals at this time. Labs and medications reviewed.   No nutrition interventions warranted at this time. If nutrition issues arise, please consult RD.   Oscar Ina, MS, RD, LDN RD pager number and weekend/on-call pager number located in [place].

## 2020-03-14 LAB — HEMOGLOBIN A1C
Hgb A1c MFr Bld: 5.7 % — ABNORMAL HIGH (ref 4.8–5.6)
Mean Plasma Glucose: 117 mg/dL

## 2020-03-14 MED ORDER — CLOPIDOGREL BISULFATE 75 MG PO TABS: 75.0000 mg | ORAL_TABLET | Freq: Every day | ORAL | 0 refills | Status: DC

## 2020-03-14 MED ORDER — ATORVASTATIN CALCIUM 40 MG PO TABS: 40.0000 mg | ORAL_TABLET | Freq: Every day | ORAL | 1 refills | Status: DC

## 2020-03-14 MED ORDER — STUDY - PACIFIC (STROKE) - BAY 2433334 (BLUE BOTTLE) 5, 15, 25MG OR PLACEBO TABLET (PI-SETHI)
1.0000 | ORAL_TABLET | Freq: Every day | ORAL | Status: DC
  Administered 2020-03-14: 1 via ORAL

## 2020-03-14 MED ORDER — STUDY - PACIFIC (STROKE) - BAY 2433334 (GREEN BOTTLE) 5, 15, 25MG OR PLACEBO TABLET (PI-SETHI)
1.0000 | ORAL_TABLET | Freq: Every day | ORAL | Status: DC
  Administered 2020-03-14: 1 via ORAL

## 2020-03-14 NOTE — Progress Notes (Signed)
Patient being discharged home.  Patient to be transported by his spouse.  IV removed with the catheter intact.  Discharge instructions and prescription information given to the patient who verbalized understanding.  Patient enrolled in Stroke Trial.  Trial medications sent with the patient.

## 2020-03-14 NOTE — Progress Notes (Signed)
Occupational Therapy Treatment Patient Details Name: Oscar [name] MRN: [id number] DOB: [birth year] Today's Date: 03/14/2020    History of present illness Patient is 86 year old male with history of hyperlipidemia, hypothyroidism, left bundle branch block with loop recorder who presented with weakness on the right side including upper and lower extremities and slurred speech.  He has chronic mild expressive aphasia.  Also found to have right facial droop.  Head CT was negative for any acute intracranial abnormalities.  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   OT comments  Pt seated in recliner upon arrival agreeable to OT intervention. Overall, pt required min A - min guard for functional mobility with RW and cane.  Dovetailed with PT to assess RW vs cane. Pt complete walkin shower transfer with MIN A - min guard. Pt reports shower seat at home and grab bars. Pt completed LB ADLs with supervision. Pt reports daughter and wife can assist pt at home. Agree with DC plan below, will follow.   Follow Up Recommendations  Home health OT;Supervision/Assistance - 24 hour    Equipment Recommendations  Tub/shower seat    Recommendations for Other Services      Precautions / Restrictions Precautions Precautions: Fall Restrictions Weight Bearing Restrictions: No       Mobility Bed Mobility               General bed mobility comments: OOB in recliner  Transfers Overall transfer level: Needs assistance Equipment used: None Transfers: Sit to/from Stand Sit to Stand: Supervision         General transfer comment: supervision for safety    Balance Overall balance assessment: Needs assistance Sitting-balance support: No upper extremity supported Sitting balance-Leahy Scale: Fair     Standing balance support: No upper extremity supported Standing balance-Leahy Scale: Fair Standing  balance comment: able to don mask standing with gross supervision                           ADL either performed or assessed with clinical judgement   ADL Overall ADL's : Needs assistance/impaired             Lower Body Bathing: Supervison/ safety;Sitting/lateral leans       Lower Body Dressing: Supervision/safety;Sitting/lateral leans   Toilet Transfer: Minimal assistance;Ambulation;Min guard;RW Armed forces technical officer Details (indicate cue type and reason): MINA- min guard with RW and cane     Tub/ Shower Transfer: Walk-in shower;3 in 1;Min guard;Grab bars   Functional mobility during ADLs: Minimal assistance;Min guard General ADL Comments: pt with improvements in RUE noted able to complete functional mobility with cane and RW and min A- min guard, LB ADls with supervision and walkin shower transfer with min A- min guard     Vision       Perception     Praxis      Cognition Arousal/Alertness: Awake/alert Behavior During Therapy: WFL for tasks assessed/performed Overall Cognitive Status: Within Functional Limits for tasks assessed                                          Exercises     Shoulder Instructions       General Comments dovetailed with PT to trial cane vs RW    Pertinent Vitals/ Pain  Pain Assessment: No/denies pain  Home Living                                          Prior Functioning/Environment              Frequency  Min 2X/week        Progress Toward Goals  OT Goals(current goals can now be found in the care plan section)  Progress towards OT goals: Progressing toward goals  Acute Rehab OT Goals Patient Stated Goal: to go home OT Goal Formulation: With patient Time For Goal Achievement: 03/27/20 Potential to Achieve Goals: Good  Plan Discharge plan remains appropriate;Frequency remains appropriate    Co-evaluation                 AM-PAC OT "6 Clicks" Daily Activity      Outcome Measure   Help from another person eating meals?: None Help from another person taking care of personal grooming?: A Little Help from another person toileting, which includes using toliet, bedpan, or urinal?: A Little Help from another person bathing (including washing, rinsing, drying)?: A Little Help from another person to put on and taking off regular upper body clothing?: None Help from another person to put on and taking off regular lower body clothing?: None 6 Click Score: 21    End of Session Equipment Utilized During Treatment: Gait belt;Rolling walker;Other (comment) (cane)  OT Visit Diagnosis: Unsteadiness on feet (R26.81);Other abnormalities of gait and mobility (R26.89);Muscle weakness (generalized) (M62.81);Hemiplegia and hemiparesis Hemiplegia - Right/Left: Right Hemiplegia - dominant/non-dominant: Dominant Hemiplegia - caused by: Cerebral infarction   Activity Tolerance Patient tolerated treatment well   Patient Left Other (comment) (handed off to PT)   Nurse Communication          Time: 1610-9604 OT Time Calculation (min): 16 min  Charges: OT General Charges $OT Visit: 1 Visit OT Treatments $Self Care/Home Management : 8-22 mins  Lanier Clam., COTA/L Acute Rehabilitation Services [phone]    Ihor Gully 03/14/2020, 3:48 PM

## 2020-03-14 NOTE — Discharge Summary (Signed)
Physician Discharge Summary  Oscar [name] RAQ:[id number] DOB: 1930-10-21 DOA: 03/12/2020  PCP: Berkley Harvey, NP  Admit date: 03/12/2020 Discharge date: 03/14/2020  Admitted From: Home Disposition:  Home  Discharge Condition:Stable CODE STATUS: DNR Diet recommendation: Heart Healthy  Brief/Interim Summary: Patient is 86 year old male with history of hyperlipidemia, hypothyroidism, left bundle branch block with loop recorder who presented with weakness on the right side including upper and lower extremities and slurred speech.  He has chronic mild expressive aphasia.  Also found to have right facial droop.  Head CT was negative for any acute intracranial abnormalities.  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  Neurology consulted.    Stroke work-up completed.  He was seen by PT/OT and recommended home health on discharge.  He is hemodynamically stable for discharge home today.  Following problems were addressed during his hospitalization:  Acute ischemic stroke: Presented with acute onset of right-sided upper and lower extremity weakness, dysarthria.  MRI as above.  Stroke work-up initiated. CTA head and neck did not show any large vessel occlusion.  Echocardiogram showed ejection fraction of 55 to 60%, no cardiac source of emboli, left ventricular hypertrophy, grade 1 diastolic dysfunction, no atrial level shunt. Hemoglobin A1C is 5.7.  LDL of 127. Started on aspirin and Plavix.  Continue statin.  He was also enrolled in [place] stroke prevention study.  Plan is to continue aspirin , Plavix,study medicine  for 3 weeks and then continue aspirin and the study medicine(X1 oral inhibitor) PT/OT evaluation done and recommended HH on DC. Patient is independent and does not have any gait problems at baseline.  Hypertension: Blood pressure currently stable. Resumed home meds  Hyperlipidemia:  LDL of 127.  Continue Lipitor 40 mg daily  Suspected Afib: Rate controlled with Toprol.  There was suspicion for possible atrial fibrillation in the past but not clearly documented in cardiology notes.  He was following with Dr. Geraldo Pitter in the hospital and was also on loop recorder placement but was discontinued without complete workup. EKG on presentation did not show any atrial fibrillation.  Currently he is in normal sinus rhythm.    Hypothyroidism: Continue levothyroxine.  He had history of thyroiditis in the past   Discharge Diagnoses:  Principal Problem:   CVA (cerebral vascular accident) [facility]) Active Problems:   Atrial tachycardia, paroxysmal ([place])   Mixed hyperlipidemia   Acute CVA (cerebrovascular accident) ([place])   Hypothyroidism (acquired)   Essential hypertension    Discharge Instructions  Discharge Instructions    Diet - low sodium heart healthy   Complete by: As directed    Discharge instructions   Complete by: As directed    1)Please take prescribed medication as instructed. 2)Follow up with [facility] neurology as an outpatient in 4 weeks. Name and number of the provider group has been attached.   Increase activity slowly   Complete by: As directed      Allergies as of 03/14/2020   No Known Allergies     Medication List    TAKE these medications   aspirin EC 81 MG tablet Take 81 mg by mouth daily.   atorvastatin 40 MG tablet Commonly known as: LIPITOR Take 1 tablet (40 mg total) by mouth daily. Start taking on: March 15, 2020   clopidogrel 75 MG tablet Commonly known as: PLAVIX Take 1 tablet (75 mg total) by mouth daily. Start taking on: March 15, 2020   GLUCOSAMINE  1500 COMPLEX PO Take 1 capsule by mouth daily.   MAGNESIUM PO Take 250 mg by mouth daily.   metoprolol succinate 25 MG 24 hr tablet Commonly known as: TOPROL-XL Take 1/2 (one-half) tablet by mouth once daily What changed: See the new instructions.   Olopatadine HCl 0.2 %  Soln Place 1 drop into both eyes as needed (Dry eye).   omeprazole 20 MG capsule Commonly known as: PRILOSEC Take 20 mg by mouth every morning.   Potassium 99 MG Tabs Take 198 mg by mouth daily.       Follow-up Information    Berkley Harvey, NP. Schedule an appointment as soon as possible for a visit in 1 week(s).   Specialty: Nurse Practitioner Contact information: [place] [phone]        [facility]. Schedule an appointment as soon as possible for a visit in 4 week(s).   Specialty: Neurology Contact information: [address] [place] [phone]             No Known Allergies  Consultations:  neurology   Procedures/Studies: CT Code Stroke CTA Head W/WO contrast  Result Date: 03/12/2020 CLINICAL DATA:  Initial evaluation for acute dysarthria, right-sided weakness. EXAM: CT ANGIOGRAPHY HEAD AND NECK CT PERFUSION BRAIN TECHNIQUE: Multidetector CT imaging of the head and neck was performed using the standard protocol during bolus administration of intravenous contrast. Multiplanar CT image reconstructions and MIPs were obtained to evaluate the vascular anatomy. Carotid stenosis measurements (when applicable) are obtained utilizing NASCET criteria, using the distal internal carotid diameter as the denominator. Multiphase CT imaging of the brain was performed following IV bolus contrast injection. Subsequent parametric perfusion maps were calculated using RAPID software. CONTRAST:  174mL OMNIPAQUE IOHEXOL 350 MG/ML SOLN COMPARISON:  Comparison made with prior head CT from earlier the same day. FINDINGS: CTA NECK FINDINGS Aortic arch: Visualized aortic arch of normal caliber with normal 3 vessel morphology. Moderate atherosclerotic change about the arch and origin of the great vessels without flow-limiting stenosis. Right carotid system: Right common carotid artery widely  patent from its origin to the bifurcation without stenosis. Eccentric calcified plaque at the right bifurcation/proximal right ICA without hemodynamically significant stenosis. Right ICA widely patent distally to the skull base without stenosis, dissection or occlusion. Left carotid system: Left CCA mildly tortuous but widely patent to the bifurcation without stenosis. Eccentric calcified plaque at the left bifurcation/proximal left ICA without hemodynamically significant stenosis. Left ICA widely patent distally to the skull base without stenosis, dissection or occlusion. Vertebral arteries: Both vertebral arteries arise from the subclavian arteries. No proximal subclavian artery stenosis. Vertebral arteries mildly tortuous proximally but are widely patent to the skull base without stenosis, dissection or occlusion. Skeleton: No visible acute osseous abnormality. Patient status post prior fusion at C4-C6, with anterior plate fixation at H8-5. No discrete or worrisome osseous lesions. Other neck: No other acute soft tissue abnormality within the neck. Upper chest: Visualized upper chest demonstrates no acute finding. Centrilobular emphysematous changes noted within the visualized lungs. Review of the MIP images confirms the above findings CTA HEAD FINDINGS Anterior circulation: Both internal carotid arteries patent to the termini without flow-limiting stenosis. A1 segments patent bilaterally. Normal anterior communicating artery complex. Anterior cerebral arteries patent to their distal aspects without stenosis. No M1 stenosis or occlusion. Normal MCA bifurcations. No proximal M2 branch occlusion. Distal MCA branches well perfused and symmetric. Posterior circulation: Vertebral arteries patent  to the vertebrobasilar junction without stenosis. Both picas patent. Basilar patent to its distal aspect without stenosis. Superior cerebral arteries patent bilaterally. Both PCAs primarily supplied via the basilar nerve well  perfused to their distal aspects without appreciable stenosis. Venous sinuses: Grossly patent allowing for timing the contrast bolus. Anatomic variants: None significant.  No intracranial aneurysm. Review of the MIP images confirms the above findings CT Brain Perfusion Findings: ASPECTS: 10. CBF (<30%) Volume: 74mL Perfusion (Tmax>6.0s) volume: 60mL Mismatch Volume: 64mL Infarction Location:Negative CT perfusion with no evidence for acute core infarct or other perfusion deficit. IMPRESSION: CTA HEAD AND NECK IMPRESSION: 1. Negative CTA for emergent large vessel occlusion. 2. Mild-to-moderate atheromatous change about the aortic arch and carotid bifurcations without hemodynamically significant stenosis. 3.  Emphysema (ICD10-J43.9). CT PERFUSION IMPRESSION: Negative CT perfusion. No evidence for acute core infarct or other perfusion deficit. Critical Value/emergent results were called by telephone at the time of interpretation on 03/12/2020 at 5:30 am to provider ERIC [facility] , who verbally acknowledged these results. Electronically Signed   By: Jeannine Boga M.D.   On: 03/12/2020 06:17   CT Code Stroke CTA Neck W/WO contrast  Result Date: 03/12/2020 CLINICAL DATA:  Initial evaluation for acute dysarthria, right-sided weakness. EXAM: CT ANGIOGRAPHY HEAD AND NECK CT PERFUSION BRAIN TECHNIQUE: Multidetector CT imaging of the head and neck was performed using the standard protocol during bolus administration of intravenous contrast. Multiplanar CT image reconstructions and MIPs were obtained to evaluate the vascular anatomy. Carotid stenosis measurements (when applicable) are obtained utilizing NASCET criteria, using the distal internal carotid diameter as the denominator. Multiphase CT imaging of the brain was performed following IV bolus contrast injection. Subsequent parametric perfusion maps were calculated using RAPID software. CONTRAST:  130mL OMNIPAQUE IOHEXOL 350 MG/ML SOLN COMPARISON:  Comparison made  with prior head CT from earlier the same day. FINDINGS: CTA NECK FINDINGS Aortic arch: Visualized aortic arch of normal caliber with normal 3 vessel morphology. Moderate atherosclerotic change about the arch and origin of the great vessels without flow-limiting stenosis. Right carotid system: Right common carotid artery widely patent from its origin to the bifurcation without stenosis. Eccentric calcified plaque at the right bifurcation/proximal right ICA without hemodynamically significant stenosis. Right ICA widely patent distally to the skull base without stenosis, dissection or occlusion. Left carotid system: Left CCA mildly tortuous but widely patent to the bifurcation without stenosis. Eccentric calcified plaque at the left bifurcation/proximal left ICA without hemodynamically significant stenosis. Left ICA widely patent distally to the skull base without stenosis, dissection or occlusion. Vertebral arteries: Both vertebral arteries arise from the subclavian arteries. No proximal subclavian artery stenosis. Vertebral arteries mildly tortuous proximally but are widely patent to the skull base without stenosis, dissection or occlusion. Skeleton: No visible acute osseous abnormality. Patient status post prior fusion at C4-C6, with anterior plate fixation at A1-9. No discrete or worrisome osseous lesions. Other neck: No other acute soft tissue abnormality within the neck. Upper chest: Visualized upper chest demonstrates no acute finding. Centrilobular emphysematous changes noted within the visualized lungs. Review of the MIP images confirms the above findings CTA HEAD FINDINGS Anterior circulation: Both internal carotid arteries patent to the termini without flow-limiting stenosis. A1 segments patent bilaterally. Normal anterior communicating artery complex. Anterior cerebral arteries patent to their distal aspects without stenosis. No M1 stenosis or occlusion. Normal MCA bifurcations. No proximal M2 branch  occlusion. Distal MCA branches well perfused and symmetric. Posterior circulation: Vertebral arteries patent to the vertebrobasilar junction without stenosis. Both picas  patent. Basilar patent to its distal aspect without stenosis. Superior cerebral arteries patent bilaterally. Both PCAs primarily supplied via the basilar nerve well perfused to their distal aspects without appreciable stenosis. Venous sinuses: Grossly patent allowing for timing the contrast bolus. Anatomic variants: None significant.  No intracranial aneurysm. Review of the MIP images confirms the above findings CT Brain Perfusion Findings: ASPECTS: 10. CBF (<30%) Volume: 56mL Perfusion (Tmax>6.0s) volume: 52mL Mismatch Volume: 66mL Infarction Location:Negative CT perfusion with no evidence for acute core infarct or other perfusion deficit. IMPRESSION: CTA HEAD AND NECK IMPRESSION: 1. Negative CTA for emergent large vessel occlusion. 2. Mild-to-moderate atheromatous change about the aortic arch and carotid bifurcations without hemodynamically significant stenosis. 3.  Emphysema (ICD10-J43.9). CT PERFUSION IMPRESSION: Negative CT perfusion. No evidence for acute core infarct or other perfusion deficit. Critical Value/emergent results were called by telephone at the time of interpretation on 03/12/2020 at 5:30 am to provider ERIC [facility] , who verbally acknowledged these results. Electronically Signed   By: Jeannine Boga M.D.   On: 03/12/2020 06:17   MR BRAIN WO CONTRAST  Result Date: 03/13/2020 CLINICAL DATA:  Stroke, follow-up; research study protocol EXAM: MRI HEAD WITHOUT CONTRAST TECHNIQUE: Multiplanar, multiecho pulse sequences of the brain and surrounding structures were obtained without intravenous contrast. COMPARISON:  03/12/2020 FINDINGS: Brain: Stable recent infarct involving the left basal ganglia and corona radiata. No evidence intracranial hemorrhage. Ventricles are stable in size. Stable findings of probable chronic  microvascular ischemic changes. Vascular: Major vessel flow voids at the skull base remain preserved. Skull and upper cervical spine: Marrow signal is within normal limits. Sinuses/Orbits: No new finding. Other: None. IMPRESSION: No substantial change in evolving recent infarction involving the left basal ganglia and corona radiata. Electronically Signed   By: Macy Mis M.D.   On: 03/13/2020 19:30   MR BRAIN WO CONTRAST  Result Date: 03/12/2020 CLINICAL DATA:  Acute dysarthria and right-sided weakness. EXAM: MRI HEAD WITHOUT CONTRAST TECHNIQUE: Multiplanar, multiecho pulse sequences of the brain and surrounding structures were obtained without intravenous contrast. COMPARISON:  CT head without contrast, CTA head and neck, and CT perfusion 03/12/2020 FINDINGS: Brain: A 13 mm linear acute nonhemorrhagic infarct is evident within the posterior limb of the left internal capsule, extending into the corona radiata. T2 signal changes are associated. Periventricular and scattered subcortical T2 hyperintensities are otherwise stable. Mild generalized atrophy is stable. The ventricles are proportionate to the degree of atrophy. No significant extraaxial fluid collection is present. Vascular: Flow is present in the major intracranial arteries. Bilateral lens replacements are noted. Globes and orbits are otherwise unremarkable. Skull and upper cervical spine: Degenerative changes are noted with fusion across the disc space at C3-4. Uncovertebral spurring contributes to foraminal narrowing. Diffuse fatty infiltration of the marrow is within normal limits for age. Craniocervical junction is otherwise normal. Sinuses/Orbits: The paranasal sinuses and mastoid air cells are clear. Bilateral lens replacements are noted. Globes and orbits are otherwise unremarkable. IMPRESSION: 1. 13 mm acute nonhemorrhagic infarct involving the posterior limb of the left internal capsule, extending into the corona radiata. This corresponds  with the patient's acute symptoms. 2. Atrophy and mild white matter disease is otherwise within normal limits for age. Electronically Signed   By: San Morelle M.D.   On: 03/12/2020 11:22   Cardiac event monitor  Result Date: 03/03/2020 EMBRY HUSS, DOB 02-23-1931, MRN [id number] EVENT MONITOR REPORT: Patient was monitored from 02/01/2020 to 02/23/2020. Indication:  Palpitations Ordering physician:  Jenean Lindau, MD Referring physician:  Jenean Lindau, MD Baseline rhythm: Sinus Minimum heart rate: 50 BPM.   Maximal heart rate 127 BPM. Atrial arrhythmia: None significant. Ventricular arrhythmia: Occasional PVCs Conduction abnormality: First-degree AV block, intraventricular conduction delay.  A 3.2-second pause at 8:29 AM Symptoms: None significant Conclusion: Mildly abnormal but overall unremarkable monitoring.  Patient had 3.2-second pause at 8:29 AM.  This was not associated with any symptoms. Interpreting  cardiologist: Jenean Lindau, MD Date: 03/03/2020 11:47 AM  CT Code Stroke Cerebral Perfusion with contrast  Result Date: 03/12/2020 CLINICAL DATA:  Initial evaluation for acute dysarthria, right-sided weakness. EXAM: CT ANGIOGRAPHY HEAD AND NECK CT PERFUSION BRAIN TECHNIQUE: Multidetector CT imaging of the head and neck was performed using the standard protocol during bolus administration of intravenous contrast. Multiplanar CT image reconstructions and MIPs were obtained to evaluate the vascular anatomy. Carotid stenosis measurements (when applicable) are obtained utilizing NASCET criteria, using the distal internal carotid diameter as the denominator. Multiphase CT imaging of the brain was performed following IV bolus contrast injection. Subsequent parametric perfusion maps were calculated using RAPID software. CONTRAST:  160mL OMNIPAQUE IOHEXOL 350 MG/ML SOLN COMPARISON:  Comparison made with prior head CT from earlier the same day. FINDINGS: CTA NECK FINDINGS Aortic  arch: Visualized aortic arch of normal caliber with normal 3 vessel morphology. Moderate atherosclerotic change about the arch and origin of the great vessels without flow-limiting stenosis. Right carotid system: Right common carotid artery widely patent from its origin to the bifurcation without stenosis. Eccentric calcified plaque at the right bifurcation/proximal right ICA without hemodynamically significant stenosis. Right ICA widely patent distally to the skull base without stenosis, dissection or occlusion. Left carotid system: Left CCA mildly tortuous but widely patent to the bifurcation without stenosis. Eccentric calcified plaque at the left bifurcation/proximal left ICA without hemodynamically significant stenosis. Left ICA widely patent distally to the skull base without stenosis, dissection or occlusion. Vertebral arteries: Both vertebral arteries arise from the subclavian arteries. No proximal subclavian artery stenosis. Vertebral arteries mildly tortuous proximally but are widely patent to the skull base without stenosis, dissection or occlusion. Skeleton: No visible acute osseous abnormality. Patient status post prior fusion at C4-C6, with anterior plate fixation at Z6-6. No discrete or worrisome osseous lesions. Other neck: No other acute soft tissue abnormality within the neck. Upper chest: Visualized upper chest demonstrates no acute finding. Centrilobular emphysematous changes noted within the visualized lungs. Review of the MIP images confirms the above findings CTA HEAD FINDINGS Anterior circulation: Both internal carotid arteries patent to the termini without flow-limiting stenosis. A1 segments patent bilaterally. Normal anterior communicating artery complex. Anterior cerebral arteries patent to their distal aspects without stenosis. No M1 stenosis or occlusion. Normal MCA bifurcations. No proximal M2 branch occlusion. Distal MCA branches well perfused and symmetric. Posterior circulation:  Vertebral arteries patent to the vertebrobasilar junction without stenosis. Both picas patent. Basilar patent to its distal aspect without stenosis. Superior cerebral arteries patent bilaterally. Both PCAs primarily supplied via the basilar nerve well perfused to their distal aspects without appreciable stenosis. Venous sinuses: Grossly patent allowing for timing the contrast bolus. Anatomic variants: None significant.  No intracranial aneurysm. Review of the MIP images confirms the above findings CT Brain Perfusion Findings: ASPECTS: 10. CBF (<30%) Volume: 59mL Perfusion (Tmax>6.0s) volume: 28mL Mismatch Volume: 54mL Infarction Location:Negative CT perfusion with no evidence for acute core infarct or other perfusion deficit. IMPRESSION: CTA HEAD AND NECK IMPRESSION: 1. Negative CTA for  emergent large vessel occlusion. 2. Mild-to-moderate atheromatous change about the aortic arch and carotid bifurcations without hemodynamically significant stenosis. 3.  Emphysema (ICD10-J43.9). CT PERFUSION IMPRESSION: Negative CT perfusion. No evidence for acute core infarct or other perfusion deficit. Critical Value/emergent results were called by telephone at the time of interpretation on 03/12/2020 at 5:30 am to provider ERIC [facility] , who verbally acknowledged these results. Electronically Signed   By: Jeannine Boga M.D.   On: 03/12/2020 06:17   ECHOCARDIOGRAM COMPLETE  Result Date: 03/12/2020    ECHOCARDIOGRAM REPORT   Patient Name:   MELQUIADES KOVAR Date of Exam: 03/12/2020 Medical Rec #:  [id number]       Height:       67.0 in Accession #:    [id number]      Weight:       155.4 lb Date of Birth:  09-Nov-1930        BSA:          1.817 m Patient Age:    45 years        BP:           172/92 mmHg Patient Gender: M               HR:           66 bpm. Exam Location:  Inpatient Procedure: 2D Echo and Intracardiac Opacification Agent Indications:    Stroke 434.91 / I163.9  History:        Patient has prior history of  Echocardiogram examinations, most                 recent 06/29/2019. Arrythmias:LBBB and Atrial tachycardia; Risk                 Factors:Dyslipidemia and Hypertension. Thyroid disease. GERD.  Sonographer:    Darlina Sicilian RDCS Referring Phys: [place]  1. Left ventricular ejection fraction, by estimation, is 55 to 60%. The left ventricle has normal function. The left ventricle has no regional wall motion abnormalities. There is moderate concentric left ventricular hypertrophy. Left ventricular diastolic parameters are consistent with Grade I diastolic dysfunction (impaired relaxation). Elevated left atrial pressure.  2. Right ventricular systolic function is normal. The right ventricular size is normal. There is normal pulmonary artery systolic pressure.  3. The mitral valve is normal in structure. Mild mitral valve regurgitation. No evidence of mitral stenosis.  4. The aortic valve is normal in structure. Aortic valve regurgitation is not visualized. Mild to moderate aortic valve sclerosis/calcification is present, without any evidence of aortic stenosis.  5. Aortic dilatation noted. There is mild dilatation of the ascending aorta measuring 41 mm.  6. The inferior vena cava is normal in size with greater than 50% respiratory variability, suggesting right atrial pressure of 3 mmHg. Conclusion(s)/Recommendation(s): No intracardiac source of embolism detected on this transthoracic study. A transesophageal echocardiogram is recommended to exclude cardiac source of embolism if clinically indicated. FINDINGS  Left Ventricle: Left ventricular ejection fraction, by estimation, is 55 to 60%. The left ventricle has normal function. The left ventricle has no regional wall motion abnormalities. Definity contrast agent was given IV to delineate the left ventricular  endocardial borders. The left ventricular internal cavity size was normal in size. There is moderate concentric left ventricular  hypertrophy. Left ventricular diastolic parameters are consistent with Grade I diastolic dysfunction (impaired relaxation). Elevated left atrial pressure. Right Ventricle: The right ventricular size is normal. No increase in right ventricular wall thickness. Right  ventricular systolic function is normal. There is normal pulmonary artery systolic pressure. The tricuspid regurgitant velocity is 2.02 m/s, and  with an assumed right atrial pressure of 3 mmHg, the estimated right ventricular systolic pressure is 00.8 mmHg. Left Atrium: Left atrial size was normal in size. Right Atrium: Right atrial size was normal in size. Pericardium: There is no evidence of pericardial effusion. Mitral Valve: The mitral valve is normal in structure. Normal mobility of the mitral valve leaflets. Mild mitral valve regurgitation. No evidence of mitral valve stenosis. Tricuspid Valve: The tricuspid valve is normal in structure. Tricuspid valve regurgitation is mild . No evidence of tricuspid stenosis. Aortic Valve: The aortic valve is normal in structure. Aortic valve regurgitation is not visualized. Mild to moderate aortic valve sclerosis/calcification is present, without any evidence of aortic stenosis. Pulmonic Valve: The pulmonic valve was normal in structure. Pulmonic valve regurgitation is not visualized. No evidence of pulmonic stenosis. Aorta: Aortic dilatation noted. There is mild dilatation of the ascending aorta measuring 41 mm. Venous: The inferior vena cava is normal in size with greater than 50% respiratory variability, suggesting right atrial pressure of 3 mmHg. IAS/Shunts: No atrial level shunt detected by color flow Doppler.  LEFT VENTRICLE PLAX 2D LVIDd:         3.70 cm     Diastology LVIDs:         2.60 cm     LV e' lateral:   4.68 cm/s LV PW:         1.20 cm     LV E/e' lateral: 14.9 LV IVS:        1.40 cm     LV e' medial:    3.37 cm/s LVOT diam:     1.90 cm     LV E/e' medial:  20.7 LV SV:         63 LV SV Index:   35  LVOT Area:     2.84 cm  LV Volumes (MOD) LV vol d, MOD A2C: 82.7 ml LV vol d, MOD A4C: 96.0 ml LV vol s, MOD A2C: 56.4 ml LV vol s, MOD A4C: 43.2 ml LV SV MOD A2C:     26.3 ml LV SV MOD A4C:     96.0 ml LV SV MOD BP:      39.3 ml RIGHT VENTRICLE RV S prime:     10.90 cm/s TAPSE (M-mode): 1.8 cm LEFT ATRIUM             Index       RIGHT ATRIUM           Index LA diam:        2.50 cm 1.38 cm/m  RA Area:     12.90 cm LA Vol (A2C):   37.9 ml 20.86 ml/m RA Volume:   25.90 ml  14.26 ml/m LA Vol (A4C):   17.8 ml 9.80 ml/m LA Biplane Vol: 26.2 ml 14.42 ml/m  AORTIC VALVE LVOT Vmax:   109.00 cm/s LVOT Vmean:  74.300 cm/s LVOT VTI:    0.222 m  AORTA Ao Root diam: 3.80 cm Ao Asc diam:  4.10 cm MITRAL VALVE                TRICUSPID VALVE MV Area (PHT): 3.45 cm     TR Peak grad:   16.3 mmHg MV Decel Time: 220 msec     TR Vmax:        202.00 cm/s MV E velocity: 69.80 cm/s MV A  velocity: 104.00 cm/s  SHUNTS MV E/A ratio:  0.67         Systemic VTI:  0.22 m                             Systemic Diam: 1.90 cm Ena Dawley MD Electronically signed by Ena Dawley MD Signature Date/Time: 03/12/2020/3:59:31 PM    Final    CT HEAD CODE STROKE WO CONTRAST  Result Date: 03/12/2020 CLINICAL DATA:  Code stroke. Initial evaluation for acute weakness, aphasia. Stroke. EXAM: CT HEAD WITHOUT CONTRAST TECHNIQUE: Contiguous axial images were obtained from the base of the skull through the vertex without intravenous contrast. COMPARISON:  Prior MRI from 12/16/2017. FINDINGS: Brain: Age-related cerebral atrophy with mild chronic small vessel ischemic disease. No acute intracranial hemorrhage. No acute large vessel territory infarct. No mass lesion, midline shift or mass effect. No hydrocephalus or extra-axial fluid collection. Vascular: No asymmetric hyperdense vessel. Scattered vascular calcifications noted within the carotid siphons. Skull: Scalp soft tissues and calvarium within normal limits. Sinuses/Orbits: Globes and orbital  soft tissues are normal. Paranasal sinuses mastoid air cells are clear. Other: None. ASPECTS [facility] Stroke Program Early CT Score) - Ganglionic level infarction (caudate, lentiform nuclei, internal capsule, insula, M1-M3 cortex): 7 - Supraganglionic infarction (M4-M6 cortex): 3 Total score (0-10 with 10 being normal): 10 IMPRESSION: 1. No acute intracranial infarct or other abnormality. 2. ASPECTS is 10. 3. Generalized age-related cerebral atrophy with mild chronic small vessel ischemic disease. These results were communicated to Dr. Cheral Marker at 5:12 amon 6/20/2021by text page via the [facility] messaging system. Electronically Signed   By: Jeannine Boga M.D.   On: 03/12/2020 05:13       Subjective: Patient seen and examined at the bedside this morning.  Currently stable for discharge today.  Discharge Exam: Vitals:   03/14/20 0307 03/14/20 0731  BP: 138/88 133/70  Pulse: 65 (!) 57  Resp: 18 20  Temp: 97.7 F (36.5 C) 97.9 F (36.6 C)  SpO2: 100% 99%   Vitals:   03/13/20 2025 03/13/20 2317 03/14/20 0307 03/14/20 0731  BP: 132/89 130/82 138/88 133/70  Pulse: 63 61 65 (!) 57  Resp: 18 18 18 20   Temp: 97.8 F (36.6 C) 97.9 F (36.6 C) 97.7 F (36.5 C) 97.9 F (36.6 C)  TempSrc: Oral Oral Oral Oral  SpO2: 100% 100% 100% 99%  Weight:      Height:        General: Pt is alert, awake, not in acute distress Cardiovascular: RRR, S1/S2 +, no rubs, no gallops Respiratory: CTA bilaterally, no wheezing, no rhonchi Abdominal: Soft, NT, ND, bowel sounds + Extremities: no edema, no cyanosis    The results of significant diagnostics from this hospitalization (including imaging, microbiology, ancillary and laboratory) are listed below for reference.     Microbiology: Recent Results (from the past 240 hour(s))  SARS Coronavirus 2 by RT PCR (hospital order, performed in [facility] hospital lab) Nasopharyngeal Nasopharyngeal Swab     Status: None   Collection Time: 03/12/20  5:21 AM    Specimen: Nasopharyngeal Swab  Result Value Ref Range Status   SARS Coronavirus 2 NEGATIVE NEGATIVE Final    Comment: (NOTE) SARS-CoV-2 target nucleic acids are NOT DETECTED.  The SARS-CoV-2 RNA is generally detectable in upper and lower respiratory specimens during the acute phase of infection. The lowest concentration of SARS-CoV-2 viral copies this assay can detect is 250 copies / mL. A negative  result does not preclude SARS-CoV-2 infection and should not be used as the sole basis for treatment or other patient management decisions.  A negative result may occur with improper specimen collection / handling, submission of specimen other than nasopharyngeal swab, presence of viral mutation(s) within the areas targeted by this assay, and inadequate number of viral copies (<250 copies / mL). A negative result must be combined with clinical observations, patient history, and epidemiological information.  Fact Sheet for Patients:   StrictlyIdeas.no  Fact Sheet for Healthcare Providers: BankingDealers.co.za  This test is not yet approved or  cleared by the [place] FDA and has been authorized for detection and/or diagnosis of SARS-CoV-2 by FDA under an Emergency Use Authorization (EUA).  This EUA will remain in effect (meaning this test can be used) for the duration of the COVID-19 declaration under Section 564(b)(1) of the Act, 21 U.S.C. section 360bbb-3(b)(1), unless the authorization is terminated or revoked sooner.  Performed at [facility], [place] [address] [place], [place] [address]      Labs: BNP (last 3 results) No results for input(s): BNP in the last 8760 hours. Basic Metabolic Panel: Recent Labs  Lab 03/12/20 0458 03/12/20 0508  NA 136 138  K 5.4* 5.1  CL 105 105  CO2 21*  --   GLUCOSE 103* 102*  BUN 21 28*  CREATININE 1.11 1.10  CALCIUM 9.1  --    Liver Function Tests: Recent Labs  Lab  03/12/20 0458  AST 30  ALT 10  ALKPHOS 52  BILITOT 1.0  PROT 6.0*  ALBUMIN 3.6   No results for input(s): LIPASE, AMYLASE in the last 168 hours. No results for input(s): AMMONIA in the last 168 hours. CBC: Recent Labs  Lab 03/12/20 0458 03/12/20 0508  WBC 6.1  --   NEUTROABS 3.9  --   HGB 13.9 13.6  HCT 44.3 40.0  MCV 97.8  --   PLT 196  --    Cardiac Enzymes: No results for input(s): CKTOTAL, CKMB, CKMBINDEX, TROPONINI in the last 168 hours. BNP: Invalid input(s): POCBNP CBG: Recent Labs  Lab 03/12/20 0450  GLUCAP 100*   D-Dimer No results for input(s): DDIMER in the last 72 hours. Hgb A1c Recent Labs    03/13/20 0500  HGBA1C 5.7*   Lipid Profile Recent Labs    03/13/20 0500  CHOL 197  HDL 51  LDLCALC 127*  TRIG 97  CHOLHDL 3.9   Thyroid function studies Recent Labs    03/12/20 0946  TSH 2.883   Anemia work up No results for input(s): VITAMINB12, FOLATE, FERRITIN, TIBC, IRON, RETICCTPCT in the last 72 hours. Urinalysis    Component Value Date/Time   COLORURINE STRAW (A) 02/12/2020 1210   APPEARANCEUR CLEAR 02/12/2020 1210   LABSPEC 1.010 02/12/2020 1210   PHURINE 7.0 02/12/2020 1210   GLUCOSEU NEGATIVE 02/12/2020 1210   HGBUR NEGATIVE 02/12/2020 1210   BILIRUBINUR NEGATIVE 02/12/2020 1210   KETONESUR NEGATIVE 02/12/2020 1210   PROTEINUR NEGATIVE 02/12/2020 1210   UROBILINOGEN 0.2 06/22/2007 0800   NITRITE NEGATIVE 02/12/2020 1210   LEUKOCYTESUR NEGATIVE 02/12/2020 1210   Sepsis Labs Invalid input(s): PROCALCITONIN,  WBC,  LACTICIDVEN Microbiology Recent Results (from the past 240 hour(s))  SARS Coronavirus 2 by RT PCR (hospital order, performed in [facility] hospital lab) Nasopharyngeal Nasopharyngeal Swab     Status: None   Collection Time: 03/12/20  5:21 AM   Specimen: Nasopharyngeal Swab  Result Value Ref Range Status   SARS Coronavirus  2 NEGATIVE NEGATIVE Final    Comment: (NOTE) SARS-CoV-2 target nucleic acids are NOT  DETECTED.  The SARS-CoV-2 RNA is generally detectable in upper and lower respiratory specimens during the acute phase of infection. The lowest concentration of SARS-CoV-2 viral copies this assay can detect is 250 copies / mL. A negative result does not preclude SARS-CoV-2 infection and should not be used as the sole basis for treatment or other patient management decisions.  A negative result may occur with improper specimen collection / handling, submission of specimen other than nasopharyngeal swab, presence of viral mutation(s) within the areas targeted by this assay, and inadequate number of viral copies (<250 copies / mL). A negative result must be combined with clinical observations, patient history, and epidemiological information.  Fact Sheet for Patients:   StrictlyIdeas.no  Fact Sheet for Healthcare Providers: BankingDealers.co.za  This test is not yet approved or  cleared by the [place] FDA and has been authorized for detection and/or diagnosis of SARS-CoV-2 by FDA under an Emergency Use Authorization (EUA).  This EUA will remain in effect (meaning this test can be used) for the duration of the COVID-19 declaration under Section 564(b)(1) of the Act, 21 U.S.C. section 360bbb-3(b)(1), unless the authorization is terminated or revoked sooner.  Performed at [facility], [place] [address] [place], [place] [address]     Please note: You were cared for by a hospitalist during your hospital stay. Once you are discharged, your primary care physician will handle any further medical issues. Please note that NO REFILLS for any discharge medications will be authorized once you are discharged, as it is imperative that you return to your primary care physician (or establish a relationship with a primary care physician if you do not have one) for your post hospital discharge needs so that they can reassess your need for  medications and monitor your lab values.    Time coordinating discharge: 40 minutes  SIGNED:   Shelly Coss, MD  [facility] 03/14/2020, 11:36 AM Pager [phone]  If 7PM-7AM, please contact night-coverage [email or web address] Password [email or web address]

## 2020-03-14 NOTE — Progress Notes (Signed)
Physical Therapy Treatment Patient Details Name: Oscar [name] MRN: [id number] DOB: 1931/07/29 Today's Date: 03/14/2020    History of Present Illness Patient is 86 year old male with history of hyperlipidemia, hypothyroidism, left bundle branch block with loop recorder who presented with weakness on the right side including upper and lower extremities and slurred speech.  He has chronic mild expressive aphasia.  Also found to have right facial droop.  Head CT was negative for any acute intracranial abnormalities.  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    PT Comments    Pt generally more steady on his feet, but to optimize safety for home, emphasized gait training with a cane and a RW, to be finalized in [place].    Follow Up Recommendations  Home health PT;Supervision/Assistance - 24 hour     Equipment Recommendations  None recommended by PT    Recommendations for Other Services       Precautions / Restrictions Precautions Precautions: Fall Restrictions Weight Bearing Restrictions: No    Mobility  Bed Mobility               General bed mobility comments: OOB in recliner  Transfers Overall transfer level: Needs assistance Equipment used: None Transfers: Sit to/from Stand Sit to Stand: Supervision         General transfer comment: supervision for safety  Ambulation/Gait Ambulation/Gait assistance: Min guard;Supervision Gait Distance (Feet): 100 Feet (then 200 to stairwell) Assistive device: Rolling walker (2 wheeled);Straight cane Gait Pattern/deviations: Step-through pattern;Decreased stride length;Decreased step length - right;Decreased step length - left Gait velocity: slower   General Gait Details: pt start with short guarded steps, but worked to extend steps to cuing, both with the RW and the cane.  Pt was not significantly better/safer using the RW vs the  cane.  He was instructed in safe use of both the RW and cane and demonstrated understanding.   Stairs Stairs: Yes Stairs assistance: Min guard Stair Management: One rail Left;With cane;Alternating pattern;Step to pattern Number of Stairs: 4 General stair comments: safe with rail   Wheelchair Mobility    Modified Rankin (Stroke Patients Only) Modified Rankin (Stroke Patients Only) Pre-Morbid Rankin Score: No symptoms Modified Rankin: Slight disability     Balance Overall balance assessment: Needs assistance Sitting-balance support: No upper extremity supported Sitting balance-Leahy Scale: Fair     Standing balance support: No upper extremity supported Standing balance-Leahy Scale: Fair Standing balance comment: could devide his attention to don his mask in standing without assist or AD                            Cognition Arousal/Alertness: Awake/alert Behavior During Therapy: WFL for tasks assessed/performed Overall Cognitive Status: Within Functional Limits for tasks assessed                                        Exercises      General Comments General comments (skin integrity, edema, etc.): dovetailed with PT to trial cane vs RW      Pertinent Vitals/Pain Pain Assessment: No/denies pain    Home Living                      Prior Function  PT Goals (current goals can now be found in the care plan section) Acute Rehab PT Goals Patient Stated Goal: to go home PT Goal Formulation: With patient Time For Goal Achievement: 03/27/20 Potential to Achieve Goals: Good Progress towards PT goals: Progressing toward goals    Frequency    Min 3X/week      PT Plan Current plan remains appropriate    Co-evaluation              AM-PAC PT "6 Clicks" Mobility   Outcome Measure  Help needed turning from your back to your side while in a flat bed without using bedrails?: None Help needed moving from lying on  your back to sitting on the side of a flat bed without using bedrails?: None Help needed moving to and from a bed to a chair (including a wheelchair)?: A Little Help needed standing up from a chair using your arms (e.g., wheelchair or bedside chair)?: A Little Help needed to walk in hospital room?: A Little Help needed climbing 3-5 steps with a railing? : A Little 6 Click Score: 20    End of Session   Activity Tolerance: Patient tolerated treatment well Patient left: in chair;with call bell/phone within reach;with chair alarm set Nurse Communication: Mobility status PT Visit Diagnosis: Unsteadiness on feet (R26.81);Difficulty in walking, not elsewhere classified (R26.2) Hemiplegia - Right/Left: Right Hemiplegia - dominant/non-dominant: Dominant Hemiplegia - caused by: Cerebral infarction     Time: 1312-1340 PT Time Calculation (min) (ACUTE ONLY): 28 min  Charges:  $Gait Training: 8-22 mins $Therapeutic Activity: 8-22 mins                     03/14/2020  Ginger Carne., PT Acute Rehabilitation Services [phone]  (pager) [phone]  (office)   Tessie Fass Alize Acy 03/14/2020, 5:48 PM

## 2020-03-14 NOTE — Progress Notes (Signed)
STROKE TEAM PROGRESS NOTE   INTERVAL HISTORY Patient is doing well.  He has no complaints.  Study specific repeat MRI scan of the brain showed stable appearance of the infarct without new abnormalities.  He has been seen by therapy and cleared to go home. His speech is clear today without dysarthria. Vitals:   03/13/20 2317 03/14/20 0307 03/14/20 0731 03/14/20 1245  BP: 130/82 138/88 133/70 126/72  Pulse: 61 65 (!) 57 63  Resp: 18 18 20 18   Temp: 97.9 F (36.6 C) 97.7 F (36.5 C) 97.9 F (36.6 C) 97.8 F (36.6 C)  TempSrc: Oral Oral Oral Oral  SpO2: 100% 100% 99% 100%  Weight:      Height:        CBC:  Recent Labs  Lab 03/12/20 0458 03/12/20 0508  WBC 6.1  --   NEUTROABS 3.9  --   HGB 13.9 13.6  HCT 44.3 40.0  MCV 97.8  --   PLT 196  --     Basic Metabolic Panel:  Recent Labs  Lab 03/12/20 0458 03/12/20 0508  NA 136 138  K 5.4* 5.1  CL 105 105  CO2 21*  --   GLUCOSE 103* 102*  BUN 21 28*  CREATININE 1.11 1.10  CALCIUM 9.1  --    Lipid Panel:     Component Value Date/Time   CHOL 197 03/13/2020 0500   TRIG 97 03/13/2020 0500   HDL 51 03/13/2020 0500   CHOLHDL 3.9 03/13/2020 0500   VLDL 19 03/13/2020 0500   LDLCALC 127 (H) 03/13/2020 0500   HgbA1c:  Lab Results  Component Value Date   HGBA1C 5.7 (H) 03/13/2020   Urine Drug Screen: No results found for: LABOPIA, COCAINSCRNUR, LABBENZ, AMPHETMU, THCU, LABBARB  Alcohol Level     Component Value Date/Time   ETH <10 12/16/2017 1003    IMAGING past 24 hours MR BRAIN WO CONTRAST  Result Date: 03/13/2020 CLINICAL DATA:  Stroke, follow-up; research study protocol EXAM: MRI HEAD WITHOUT CONTRAST TECHNIQUE: Multiplanar, multiecho pulse sequences of the brain and surrounding structures were obtained without intravenous contrast. COMPARISON:  03/12/2020 FINDINGS: Brain: Stable recent infarct involving the left basal ganglia and corona radiata. No evidence intracranial hemorrhage. Ventricles are stable in size.  Stable findings of probable chronic microvascular ischemic changes. Vascular: Major vessel flow voids at the skull base remain preserved. Skull and upper cervical spine: Marrow signal is within normal limits. Sinuses/Orbits: No new finding. Other: None. IMPRESSION: No substantial change in evolving recent infarction involving the left basal ganglia and corona radiata. Electronically Signed   By: Oscar Mis M.D.   On: 03/13/2020 19:30    PHYSICAL EXAM [name] not in distress.  Slightly hard of hearing. . Afebrile. Head is nontraumatic. Neck is supple without bruit.    Cardiac exam no murmur or gallop. Lungs are clear to auscultation. Distal pulses are well felt. Neurological Exam :  Awake alert oriented to time place and person.  Speech slightly dysarthric.  Language is normal.  Follows commands well.  Slight decrease hearing bilaterally.  Extraocular movements full range without nystagmus.  Blinks to threat bilaterally.  No visual field deficit .  Slight right lower facial asymmetry when he smiles.  Tongue midline.  Motor system exam no upper or lower extremity drift but mild right grip weakness.  Diminished fine finger movements on the right.  Orbits left over right upper extremity.  Mild weakness of right hip flexors on simultaneous lower extremity testing.  Sensation intact.  Coordination is slow but accurate.  Gait not tested.    ASSESSMENT/PLAN Oscar [name] is a 86 y.o. male with history of bladder tumor, cervical stenosis, concussions x 2, HLD and loop recorder placement presenting with R sided weakness.    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 Small vessel disease. Atrophy. ASPECTS 10.     CTA head & neck no LVO. Aortic arch and ICA bifurcation atherosclerosis. Emphysema.   CT perfusion negative   MRI  L PLIC infarct extending into corona radiata. Small vessel disease. Atrophy.   2D Echo EF  55-60%. No source of embolus   LDL 127  HgbA1c pending   Lovenox 40 mg sq daily for VTE prophylaxis  aspirin 81 mg daily prior to admission, now on aspirin 81 mg daily and clopidogrel 75 mg daily. Continue DAPT x 3 weeks then Plavix alone. Do not recommend [facility] unless an embolic source is clearly identified. Consider PACIFIC-STROKE research study.   Therapy recommendations:  HH OT  Disposition:  pending   Hx possible AF but not documented.   Hypertension  Stable . Permissive hypertension (OK if < 220/120) but gradually normalize in 5-7 days . Long-term BP goal normotensive  Hyperlipidemia  Home meds:  No statin  Now on lipitor 40  LDL 127, goal < 70  Continue statin at discharge  Other Stroke Risk Factors  Advanced age  Former Cigarette smoker, quit 41 yrs ago  ETOH use, alcohol level <10, advised to drink no more than 2 drink(s) a day  Family hx stroke (father)  Other Active Problems  LBBB s/p loop 2016  Hx bladder tumor  Hospital day # 2  Patient presented with right-sided weakness secondary to left posterior limb internal capsule infarct from small vessel disease.  He remains at risk for recurrent strokes.  Recommend dual antiplatelet therapy and aggressive risk factor modification continue participation in the [employer] stroke prevention study of factor XI oral inhibitor.  He will continue aspirin for 3 months, Plavix for 3 weeks and the stroke study medication as per protocol.  He will follow-up in the stroke research clinic as per study protocol greater than 50% time during this 25-minute visit was spent on counseling and coordination of care about his stroke and answering questions about stroke evaluation, treatment and prevention.  Discussed with patient and Dr. Luna Kitchens, MD To contact Stroke Continuity provider, please refer to [email or web address] After hours, contact General Neurology

## 2020-03-14 NOTE — Evaluation (Signed)
Speech Language Pathology Evaluation Patient Details Name: Oscar [name] MRN: [id number] DOB: 1931/03/29 Today's Date: 03/14/2020 Time: 1517-6160 SLP Time Calculation (min) (ACUTE ONLY): 28 min  Problem List:  Patient Active Problem List   Diagnosis Date Noted  . CVA (cerebral vascular accident) ([place]) 03/12/2020  . Acute CVA (cerebrovascular accident) ([place]) 03/12/2020  . Hypothyroidism (acquired) 03/12/2020  . Essential hypertension 03/12/2020  . Atrial tachycardia ([place]) 01/24/2020  . Atrial tachycardia, paroxysmal ([place]) 08/05/2019  . Coronary artery disease involving native coronary artery of native heart without angina pectoris 08/05/2019  . Mixed hyperlipidemia 08/05/2019  . Abnormal nuclear stress test   . Exertional dyspnea   . 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 Cervical myelopathy ([place]) 01/27/2018  . Status post placement of implantable loop recorder 11/08/2014  . Near syncope 05/16/2014  . Cancer of bladder ([place]) 02/10/2013   Past Medical History:  Past Medical History:  Diagnosis Date  . Bladder tumor   . Cervical stenosis of spine   . E. coli infection   . Essential hypertension 03/12/2020  . GERD (gastroesophageal reflux disease)   . History of appendectomy   . History of concussion    X2   NO RESIDUALS  . History of peptic ulcer 1970'S  . Hyperlipidemia   . Irregular heart beat   . LBBB (left bundle branch block)   . Status post placement of implantable loop recorder 11/08/2014   Past Surgical History:  Past Surgical History:  Procedure Laterality Date  . ANTERIOR CERVICAL DECOMP/DISCECTOMY FUSION N/A 01/27/2018   Procedure: Anterior Cervical Discectomy Fusion - Cervical Four- Cervical Five;  Surgeon: Earnie Larsson, MD;  Location: [facility];  Service: Neurosurgery;  Laterality: N/A;  Anterior Cervical Discectomy Fusion - Cervical Four- Cervical Five  . APPENDECTOMY  1970  . CARDIOVASCULAR STRESS TEST  03/22/09  . CYSTOSCOPY WITH BIOPSY N/A  02/10/2013   Procedure: CYSTOSCOPY WITH COLD CUP BIOPSY/FULGERATION;  Surgeon: Bernestine Amass, MD;  Location: [facility];  Service: Urology;  Laterality: N/A;  ALSO FULGERATION   . INGUINAL HERNIA REPAIR Right 07-14-2002  . LEFT HEART CATH AND CORONARY ANGIOGRAPHY N/A 07/13/2019   Procedure: LEFT HEART CATH AND CORONARY ANGIOGRAPHY;  Surgeon: Troy Sine, MD;  Location: [facility];  Service: Cardiovascular;  Laterality: N/A;  . LOOP RECORDER IMPLANT N/A 05/24/2014   Procedure: LOOP RECORDER IMPLANT;  Surgeon: Sanda Klein, MD;  Location: [place] [facility] C[facility];  Service: Cardiovascular;  Laterality: N/A;  . NECK SURGERY    . TONSILLECTOMY    . TRANSURETHRAL RESECTION OF BLADDER TUMOR  03-10-2008   HPI:  Patient is 86 year old male with history of hyperlipidemia, hypothyroidism, left bundle branch block with loop recorder who presented with weakness on the right side including upper and lower extremities and slurred speech, right facial droop.  Head CT was negative for any acute intracranial abnormalities.  MRI showed 13 mm nonhemorrhagic infarct involving the posterior limb of the left internal capsule, extending into the corona radiata.   Assessment / Plan / Recommendation Clinical Impression  Pt presents with receptive/expressive language WNL with adequate fluency, word-retrieval, yes/no accuracy, and complex paragraph comprehension.  Speech is clear and without dysarthria.  There are mild hesitancies occasionally in speech production that pt endorses as baseline.  Memory, executive functioning, awareness are [facility].  We reviewed the BE-FAST acronym; pt verbalized understanding. No f/u SLP is needed. Our service will sign off.     SLP Assessment  SLP Recommendation/Assessment: Patient does not  need any further Speech Lanaguage Pathology Services SLP Visit Diagnosis: Aphasia (R47.01)    Follow Up Recommendations  None    Frequency and Duration           SLP  Evaluation Cognition  Overall Cognitive Status: Within Functional Limits for tasks assessed Orientation Level: Oriented X4 Attention: Selective Selective Attention: Appears intact Memory: Appears intact Awareness: Appears intact Safety/Judgment: Appears intact       Comprehension  Auditory Comprehension Overall Auditory Comprehension: Appears within functional limits for tasks assessed Yes/No Questions: Within Functional Limits Commands: Within Functional Limits Conversation: Complex Reading Comprehension Reading Status: Within funtional limits    Expression Verbal Expression Initiation: No impairment Automatic Speech: Name;Social Response;Counting Level of Generative/Spontaneous Verbalization: Conversation Repetition: No impairment Naming: No impairment Pragmatics: No impairment Written Expression Dominant Hand: Right Written Expression: Not tested   Oral / Motor  Oral Motor/Sensory Function Overall Oral Motor/Sensory Function: Within functional limits Motor Speech Overall Motor Speech: Appears within functional limits for tasks assessed   GO                    Oscar [name] 03/14/2020, 9:01 AM   Estill Bamberg L. Tivis Ringer, [place] Office number [phone] Pager [phone]

## 2020-03-14 NOTE — Plan of Care (Signed)
  Problem: Education: Goal: Knowledge of General Education information will improve Description: Including pain rating scale, medication(s)/side effects and non-pharmacologic comfort measures Outcome: Progressing   Problem: Health Behavior/Discharge Planning: Goal: Ability to manage health-related needs will improve Outcome: Progressing   Problem: Nutrition: Goal: Adequate nutrition will be maintained Outcome: Progressing   Problem: Coping: Goal: Level of anxiety will decrease Outcome: Progressing   Problem: Safety: Goal: Ability to remain free from injury will improve Outcome: Progressing   Problem: Education: Goal: Knowledge of disease or condition will improve Outcome: Progressing Goal: Knowledge of secondary prevention will improve Outcome: Progressing Goal: Knowledge of patient specific risk factors addressed and post discharge goals established will improve Outcome: Progressing Goal: Individualized Educational Video(s) Outcome: Progressing

## 2020-03-14 NOTE — Plan of Care (Signed)
  Problem: Education: Goal: Knowledge of General Education information will improve Description: Including pain rating scale, medication(s)/side effects and non-pharmacologic comfort measures 03/14/2020 1223 by Caroll Rancher, RN Outcome: Adequate for Discharge 03/14/2020 0906 by Caroll Rancher, RN Outcome: Progressing   Problem: Clinical Measurements: Goal: Ability to maintain clinical measurements within normal limits will improve 03/14/2020 1223 by Caroll Rancher, RN Outcome: Adequate for Discharge 03/14/2020 0906 by Caroll Rancher, RN Outcome: Progressing Goal: Will remain free from infection 03/14/2020 1223 by Caroll Rancher, RN Outcome: Adequate for Discharge 03/14/2020 0906 by Caroll Rancher, RN Outcome: Progressing Goal: Diagnostic test results will improve 03/14/2020 1223 by Caroll Rancher, RN Outcome: Adequate for Discharge 03/14/2020 0906 by Caroll Rancher, RN Outcome: Progressing Goal: Respiratory complications will improve 03/14/2020 1223 by Caroll Rancher, RN Outcome: Adequate for Discharge 03/14/2020 0906 by Caroll Rancher, RN Outcome: Progressing Goal: Cardiovascular complication will be avoided 03/14/2020 1223 by Caroll Rancher, RN Outcome: Adequate for Discharge 03/14/2020 0906 by Caroll Rancher, RN Outcome: Progressing

## 2020-03-14 NOTE — TOC Transition Note (Signed)
Transition of Care [facility]) - CM/SW Discharge Note   Patient Details  Name: BLESSING OZGA MRN: [id number] Date of Birth: [birth year]  Transition of Care [facility]) CM/SW Contact:  Pollie Friar, RN Phone Number: 03/14/2020, 12:24 PM   Clinical Narrative:    Pt discharging home with [facility] services through [facility]. Cassie with [facility] accepted the referral.  Pt states he has a shower with a seat so he doesn't feel he needs the tub seat.  Pt states he has supervision at home and transportation to home.  Pt denies issues with home medications.   Final next level of care: Home w Home Health Services Barriers to Discharge: No Barriers Identified   Patient Goals and CMS Choice   CMS Medicare.gov Compare Post Acute Care list provided to:: Patient Choice offered to / list presented to : Patient  Discharge Placement                       Discharge Plan and Services                          HH Arranged: PT, OT [facility] Agency: [facility] Date [place]: 03/14/20   Representative spoke with at [place]: [facility]  Social Determinants of Health ([place]) Interventions     Readmission Risk Interventions No flowsheet data found.

## 2020-03-15 ENCOUNTER — Ambulatory Visit: Payer: Medicare Other | Admitting: Cardiology

## 2020-03-23 ENCOUNTER — Ambulatory Visit: Payer: Medicare Other | Admitting: Cardiology

## 2020-03-24 ENCOUNTER — Other Ambulatory Visit: Payer: Self-pay

## 2020-03-24 ENCOUNTER — Encounter: Payer: Self-pay | Admitting: Cardiology

## 2020-03-24 ENCOUNTER — Ambulatory Visit (INDEPENDENT_AMBULATORY_CARE_PROVIDER_SITE_OTHER): Payer: Medicare Other | Admitting: Cardiology

## 2020-03-24 VITALS — BP 120/72 | HR 66 | Ht 67.0 in | Wt 155.0 lb

## 2020-03-24 DIAGNOSIS — I10 Essential (primary) hypertension: Secondary | ICD-10-CM

## 2020-03-24 DIAGNOSIS — I2510 Atherosclerotic heart disease of native coronary artery without angina pectoris: Secondary | ICD-10-CM

## 2020-03-24 DIAGNOSIS — E782 Mixed hyperlipidemia: Secondary | ICD-10-CM | POA: Diagnosis not present

## 2020-03-24 DIAGNOSIS — I639 Cerebral infarction, unspecified: Secondary | ICD-10-CM | POA: Diagnosis not present

## 2020-03-24 NOTE — Progress Notes (Signed)
Cardiology Office Note:    Date:  03/24/2020   ID:  KASSEM KIBBE, DOB 1931/03/22, MRN [id number]  PCP:  Berkley Harvey, NP  Cardiologist:  Jenean Lindau, MD   Referring MD: Berkley Harvey, NP    ASSESSMENT:    No diagnosis found. PLAN:    In order of problems listed above:  1. Coronary artery disease: Secondary prevention stressed with the patient.  Importance of compliance with diet medication stressed and he vocalized understanding.  Coronary angiography report from the past was reviewed. 2. Essential hypertension: Blood pressure is stable 3. History of stroke: Records were reviewed.  Questions were answered extensively to his satisfaction.  Patient is on guideline directed medical therapy. 4. Mixed dyslipidemia: On statin therapy and managed by primary care provider. 5. This is his post hospital visit and daughter had multiple questions which were answered to their satisfaction.  She has an appointment from before coming up with our partner at the end of the month and they will keep that appointment.   Medication Adjustments/Labs and Tests Ordered: Current medicines are reviewed at length with the patient today.  Concerns regarding medicines are outlined above.  No orders of the defined types were placed in this encounter.  No orders of the defined types were placed in this encounter.    No chief complaint on file.    History of Present Illness:    Oscar [name] is a 86 y.o. male.  Patient has past medical history of nonobstructive coronary artery disease.  He has been evaluated in the past for syncope.  Distress but largely unremarkable.  Last event monitor was unremarkable with the second pause of 3.2 seconds which is asymptomatic.  Patient has not passed out.  He recently had a stroke was admitted to the hospital and was discharged on 2 antiplatelet agents.  The patient also is in a research study.  He has a very supportive daughter who takes care of him.  At the  time of my evaluation, the patient is alert awake oriented and in no distress.  Past Medical History:  Diagnosis Date   Bladder tumor    Cervical stenosis of spine    E. coli infection    Essential hypertension 03/12/2020   GERD (gastroesophageal reflux disease)    History of appendectomy    History of concussion    X2   NO RESIDUALS   History of peptic ulcer 1970'S   Hyperlipidemia    Irregular heart beat    LBBB (left bundle branch block)    Status post placement of implantable loop recorder 11/08/2014    Past Surgical History:  Procedure Laterality Date   ANTERIOR CERVICAL DECOMP/DISCECTOMY FUSION N/A 01/27/2018   Procedure: Anterior Cervical Discectomy Fusion - Cervical Four- Cervical Five;  Surgeon: Earnie Larsson, MD;  Location: [facility];  Service: Neurosurgery;  Laterality: N/A;  Anterior Cervical Discectomy Fusion - Cervical Four- Cervical Five   APPENDECTOMY  1970   CARDIOVASCULAR STRESS TEST  03/22/09   CYSTOSCOPY WITH BIOPSY N/A 02/10/2013   Procedure: CYSTOSCOPY WITH COLD CUP BIOPSY/FULGERATION;  Surgeon: Bernestine Amass, MD;  Location: [facility];  Service: Urology;  Laterality: N/A;  ALSO FULGERATION    INGUINAL HERNIA REPAIR Right 07-14-2002   LEFT HEART CATH AND CORONARY ANGIOGRAPHY N/A 07/13/2019   Procedure: LEFT HEART CATH AND CORONARY ANGIOGRAPHY;  Surgeon: Troy Sine, MD;  Location: [place] CV [facility];  Service: Cardiovascular;  Laterality: N/A;   LOOP RECORDER  IMPLANT N/A 05/24/2014   Procedure: LOOP RECORDER IMPLANT;  Surgeon: Sanda Klein, MD;  Location: [facility] CATH LAB;  Service: Cardiovascular;  Laterality: N/A;   NECK SURGERY     TONSILLECTOMY     TRANSURETHRAL RESECTION OF BLADDER TUMOR  03-10-2008    Current Medications: Current Meds  Medication Sig   aspirin EC 81 MG tablet Take 81 mg by mouth daily.   atorvastatin (LIPITOR) 40 MG tablet Take 1 tablet (40 mg total) by mouth daily.   clopidogrel (PLAVIX) 75 MG tablet  Take 1 tablet (75 mg total) by mouth daily.   Glucosamine-Chondroit-Vit C-Mn (GLUCOSAMINE 1500 COMPLEX PO) Take 1 capsule by mouth daily.   MAGNESIUM PO Take 250 mg by mouth daily.    metoprolol succinate (TOPROL-XL) 25 MG 24 hr tablet Take 1/2 (one-half) tablet by mouth once daily (Patient taking differently: Take 12.5 mg by mouth daily. )   Olopatadine HCl 0.2 % SOLN Place 1 drop into both eyes as needed (Dry eye).    omeprazole (PRILOSEC) 20 MG capsule Take 20 mg by mouth every morning.   Potassium 99 MG TABS Take 198 mg by mouth daily.      Allergies:   Patient has no known allergies.   Social History   Socioeconomic History   Marital status: Married    Spouse name: Not on file   Number of children: Not on file   Years of education: Not on file   Highest education level: Not on file  Occupational History   Occupation: retired  Tobacco Use   Smoking status: Former Smoker    Packs/day: 0.50    Years: 30.00    Pack years: 15.00    Types: Cigarettes    Quit date: 02/06/1979    Years since quitting: 41.1   Smokeless tobacco: Never Used  Vaping Use   Vaping Use: Never used  Substance and Sexual Activity   Alcohol use: Yes    Alcohol/week: 0.0 standard drinks    Comment: 1 glass of beer once or twice a month   Drug use: No   Sexual activity: Not on file  Other Topics Concern   Not on file  Social History Narrative   Not on file   Social Determinants of Health   Financial Resource Strain:    Difficulty of Paying Living Expenses:   Food Insecurity:    Worried About Charity fundraiser in the Last Year:    Arboriculturist in the Last Year:   Transportation Needs:    Film/video editor (Medical):    Lack of Transportation (Non-Medical):   Physical Activity:    Days of Exercise per Week:    Minutes of Exercise per Session:   Stress:    Feeling of Stress :   Social Connections:    Frequency of Communication with Friends and Family:     Frequency of Social Gatherings with Friends and Family:    Attends Religious Services:    Active Member of Clubs or Organizations:    Attends Archivist Meetings:    Marital Status:      Family History: The patient's family history includes Diabetes in his brother; Heart failure in his brother and mother; Stroke in his father.  ROS:   Please see the history of present illness.    All other systems reviewed and are negative.  EKGs/Labs/Other Studies Reviewed:    The following studies were reviewed today: EVENT MONITOR REPORT:   Patient was monitored from  02/01/2020 to 02/23/2020. Indication:                    Palpitations Ordering physician:  Jenean Lindau, MD  Referring physician:        Jenean Lindau, MD    Baseline rhythm: Sinus  Minimum heart rate: 50 BPM.   Maximal heart rate 127 BPM.  Atrial arrhythmia: None significant.   Ventricular arrhythmia: Occasional PVCs  Conduction abnormality: First-degree AV block, intraventricular conduction delay.  A 3.2-second pause at 8:29 AM  Symptoms: None significant   Conclusion:  Mildly abnormal but overall unremarkable monitoring.  Patient had 3.2-second pause at 8:29 AM.  This was not associated with any symptoms.  Interpreting  cardiologist: Jenean Lindau, MD  Date: 03/03/2020 11:47 AM     Recent Labs: 06/29/2019: Magnesium 2.1 03/12/2020: ALT 10; BUN 28; Creatinine, Ser 1.10; Hemoglobin 13.6; Platelets 196; Potassium 5.1; Sodium 138; TSH 2.883  Recent Lipid Panel    Component Value Date/Time   CHOL 197 03/13/2020 0500   TRIG 97 03/13/2020 0500   HDL 51 03/13/2020 0500   CHOLHDL 3.9 03/13/2020 0500   VLDL 19 03/13/2020 0500   LDLCALC 127 (H) 03/13/2020 0500    Physical Exam:    VS:  BP 120/72    Pulse 66    Ht 5\' 7"  (1.702 m)    Wt 155 lb (70.3 kg)    SpO2 99%    BMI 24.28 kg/m     Wt Readings from Last 3 Encounters:  03/24/20 155 lb (70.3 kg)  03/12/20 155 lb 6.8 oz (70.5  kg)  02/16/20 158 lb (71.7 kg)     GEN: Patient is in no acute distress HEENT: Normal NECK: No JVD; No carotid bruits LYMPHATICS: No lymphadenopathy CARDIAC: Hear sounds regular, 2/6 systolic murmur at the apex. RESPIRATORY:  Clear to auscultation without rales, wheezing or rhonchi  ABDOMEN: Soft, non-tender, non-distended MUSCULOSKELETAL:  No edema; No deformity  SKIN: Warm and dry NEUROLOGIC:  Alert and oriented x 3 PSYCHIATRIC:  Normal affect   Signed, Jenean Lindau, MD  03/24/2020 2:55 PM    [facility]

## 2020-03-24 NOTE — Patient Instructions (Signed)
Medication Instructions:  No medication changes. *If you need a refill on your cardiac medications before your next appointment, please call your pharmacy*   Lab Work: None ordered If you have labs (blood work) drawn today and your tests are completely normal, you will receive your results only by: Marland Kitchen MyChart Message (if you have MyChart) OR . A paper copy in the mail If you have any lab test that is abnormal or we need to change your treatment, we will call you to review the results.   Testing/Procedures: None ordered   Follow-Up: At [facility], you and your health needs are our priority.  As part of our continuing mission to provide you with exceptional heart care, we have created designated Provider Care Teams.  These Care Teams include your primary Cardiologist (physician) and Advanced Practice Providers (APPs -  Physician Assistants and Nurse Practitioners) who all work together to provide you with the care you need, when you need it.  We recommend signing up for the patient portal called "MyChart".  Sign up information is provided on this After Visit Summary.  MyChart is used to connect with patients for Virtual Visits (Telemedicine).  Patients are able to view lab/test results, encounter notes, upcoming appointments, etc.  Non-urgent messages can be sent to your provider as well.   To learn more about what you can do with MyChart, go to [email or web address].     The format for your next appointment:   In Person  Provider:   Berniece Salines, DO   Other Instructions NA

## 2020-03-28 DIAGNOSIS — Z09 Encounter for follow-up examination after completed treatment for conditions other than malignant neoplasm: Secondary | ICD-10-CM

## 2020-03-28 HISTORY — DX: Encounter for follow-up examination after completed treatment for conditions other than malignant neoplasm: Z09

## 2020-03-31 ENCOUNTER — Other Ambulatory Visit: Payer: Self-pay

## 2020-03-31 ENCOUNTER — Ambulatory Visit (INDEPENDENT_AMBULATORY_CARE_PROVIDER_SITE_OTHER): Payer: Medicare Other | Admitting: Otolaryngology

## 2020-03-31 DIAGNOSIS — H903 Sensorineural hearing loss, bilateral: Secondary | ICD-10-CM | POA: Diagnosis not present

## 2020-03-31 DIAGNOSIS — R42 Dizziness and giddiness: Secondary | ICD-10-CM

## 2020-03-31 NOTE — Progress Notes (Signed)
HPI: Oscar [name] is a 86 y.o. male who presents is referred by his cardiologist for evaluation of dizziness.  He presents to the office with his daughter today.  He complains of weakness and balance problems.  He does not really describe vertigo or spinning sensation.  No dizziness while he is in bed at night.  Mostly when he is walking or stands up and shakes his head he feels dizzy.  He has had longstanding hearing loss but does not want to wear hearing aids.  He had a stroke recently and is receiving physical therapy presently from his stroke.  He has not noted any significant change in his hearing recently..  Past Medical History:  Diagnosis Date  . Bladder tumor   . Cervical stenosis of spine   . E. coli infection   . Essential hypertension 03/12/2020  . GERD (gastroesophageal reflux disease)   . History of appendectomy   . History of concussion    X2   NO RESIDUALS  . History of peptic ulcer 1970'S  . Hyperlipidemia   . Irregular heart beat   . LBBB (left bundle branch block)   . Status post placement of implantable loop recorder 11/08/2014   Past Surgical History:  Procedure Laterality Date  . ANTERIOR CERVICAL DECOMP/DISCECTOMY FUSION N/A 01/27/2018   Procedure: Anterior Cervical Discectomy Fusion - Cervical Four- Cervical Five;  Surgeon: Earnie Larsson, MD;  Location: [facility];  Service: Neurosurgery;  Laterality: N/A;  Anterior Cervical Discectomy Fusion - Cervical Four- Cervical Five  . APPENDECTOMY  1970  . CARDIOVASCULAR STRESS TEST  03/22/09  . CYSTOSCOPY WITH BIOPSY N/A 02/10/2013   Procedure: CYSTOSCOPY WITH COLD CUP BIOPSY/FULGERATION;  Surgeon: Bernestine Amass, MD;  Location: [facility];  Service: Urology;  Laterality: N/A;  ALSO FULGERATION   . INGUINAL HERNIA REPAIR Right 07-14-2002  . LEFT HEART CATH AND CORONARY ANGIOGRAPHY N/A 07/13/2019   Procedure: LEFT HEART CATH AND CORONARY ANGIOGRAPHY;  Surgeon: Troy Sine, MD;  Location: [facility];   Service: Cardiovascular;  Laterality: N/A;  . LOOP RECORDER IMPLANT N/A 05/24/2014   Procedure: LOOP RECORDER IMPLANT;  Surgeon: Sanda Klein, MD;  Location: [facility];  Service: Cardiovascular;  Laterality: N/A;  . NECK SURGERY    . TONSILLECTOMY    . TRANSURETHRAL RESECTION OF BLADDER TUMOR  03-10-2008   Social History   Socioeconomic History  . Marital status: Married    Spouse name: Not on file  . Number of children: Not on file  . Years of education: Not on file  . Highest education level: Not on file  Occupational History  . Occupation: retired  Tobacco Use  . Smoking status: Former Smoker    Packs/day: 0.50    Years: 30.00    Pack years: 15.00    Types: Cigarettes    Quit date: 02/06/1979    Years since quitting: 41.1  . Smokeless tobacco: Never Used  Vaping Use  . Vaping Use: Never used  Substance and Sexual Activity  . Alcohol use: Yes    Alcohol/week: 0.0 standard drinks    Comment: 1 glass of beer once or twice a month  . Drug use: No  . Sexual activity: Not on file  Other Topics Concern  . Not on file  Social History Narrative  . Not on file   Social Determinants of Health   Financial Resource Strain:   . Difficulty of Paying Living Expenses:   Food Insecurity:   .  Worried About Charity fundraiser in the Last Year:   . Arboriculturist in the Last Year:   Transportation Needs:   . Film/video editor (Medical):   Marland Kitchen Lack of Transportation (Non-Medical):   Physical Activity:   . Days of Exercise per Week:   . Minutes of Exercise per Session:   Stress:   . Feeling of Stress :   Social Connections:   . Frequency of Communication with Friends and Family:   . Frequency of Social Gatherings with Friends and Family:   . Attends Religious Services:   . Active Member of Clubs or Organizations:   . Attends Archivist Meetings:   Marland Kitchen Marital Status:    Family History  Problem Relation Age of Onset  . Heart failure Mother   . Stroke Father    . Diabetes Brother   . Heart failure Brother    No Known Allergies Prior to Admission medications   Medication Sig Start Date End Date Taking? Authorizing Provider  aspirin EC 81 MG tablet Take 81 mg by mouth daily.    [provider]  atorvastatin (LIPITOR) 40 MG tablet Take 1 tablet (40 mg total) by mouth daily. 03/15/20   Shelly Coss, MD  clopidogrel (PLAVIX) 75 MG tablet Take 1 tablet (75 mg total) by mouth daily. 03/15/20   Shelly Coss, MD  Glucosamine-Chondroit-Vit C-Mn (GLUCOSAMINE 1500 COMPLEX PO) Take 1 capsule by mouth daily.    [provider]  MAGNESIUM PO Take 250 mg by mouth daily.     [provider]  metoprolol succinate (TOPROL-XL) 25 MG 24 hr tablet Take 1/2 (one-half) tablet by mouth once daily Patient taking differently: Take 12.5 mg by mouth daily.  01/11/20   Tobb, Kardie, DO  Olopatadine HCl 0.2 % SOLN Place 1 drop into both eyes as needed (Dry eye).  03/01/19   [provider]  omeprazole (PRILOSEC) 20 MG capsule Take 20 mg by mouth every morning.    [provider]  Potassium 99 MG TABS Take 198 mg by mouth daily.     [provider]     Positive ROS: Otherwise negative  All other systems have been reviewed and were otherwise negative with the exception of those mentioned in the HPI and as above.  Physical Exam: Constitutional: Alert, well-appearing, no acute distress Ears: External ears without lesions or tenderness. Ear canals are clear bilaterally.  TMs are clear bilaterally.  On Dix-Hallpike testing he has no evidence of BPPV.  On hearing screening with a 512 1024 tuning fork he has moderate hearing loss in both ears which is symmetric. Nasal: External nose without lesion. Clear nasal passages Oral: Lips and gums without lesions. Tongue and palate mucosa without lesions. Posterior oropharynx clear. Neck: No palpable adenopathy or masses Respiratory: Breathing comfortably  Skin: No facial/neck lesions  or rash noted.  Procedures  Assessment: Dizziness related to vestibular weakness. No evidence of BPPV or significant vestibular abnormality.  Plan: Reviewed with the patient as well as his daughter concerning balance related to aging and vestibular weakness. The best treatment for this would be physical therapy which he is already receiving as well as regular exercises that he can perform on his own but making sure that he does not sustain injury. They will call back if they want [place] to refer him to vestibular rehab. I would also encourage him to see about obtaining hearing aids.   Radene Journey, MD   CC:

## 2020-04-12 ENCOUNTER — Encounter: Payer: Self-pay | Admitting: Cardiology

## 2020-04-12 ENCOUNTER — Other Ambulatory Visit: Payer: Self-pay

## 2020-04-12 ENCOUNTER — Ambulatory Visit (INDEPENDENT_AMBULATORY_CARE_PROVIDER_SITE_OTHER): Payer: Medicare Other | Admitting: Cardiology

## 2020-04-12 VITALS — BP 138/80 | HR 64 | Ht 67.0 in | Wt 154.0 lb

## 2020-04-12 DIAGNOSIS — I471 Supraventricular tachycardia: Secondary | ICD-10-CM

## 2020-04-12 DIAGNOSIS — R600 Localized edema: Secondary | ICD-10-CM

## 2020-04-12 DIAGNOSIS — I2510 Atherosclerotic heart disease of native coronary artery without angina pectoris: Secondary | ICD-10-CM

## 2020-04-12 DIAGNOSIS — Z8673 Personal history of transient ischemic attack (TIA), and cerebral infarction without residual deficits: Secondary | ICD-10-CM | POA: Diagnosis not present

## 2020-04-12 DIAGNOSIS — I10 Essential (primary) hypertension: Secondary | ICD-10-CM

## 2020-04-12 MED ORDER — FUROSEMIDE 20 MG PO TABS
ORAL_TABLET | ORAL | 3 refills | Status: DC
Start: 2020-04-12 — End: 2020-11-27

## 2020-04-12 NOTE — Progress Notes (Signed)
Cardiology Office Note:    Date:  04/12/2020   ID:  Oscar [name], DOB 1931-08-04, MRN [id number]  PCP:  Berkley Harvey, NP  Cardiologist:  No primary care provider on file.  Electrophysiologist:  None   Referring MD: Berkley Harvey, NP   Chief Complaint  Patient presents with  . Follow-up    monitor  . Hospitalization Follow-up    History of Present Illness:    Oscar [name] is a 86 y.o. male with a hx of paroxysmal atrial tachycardia seen on ZIO monitor, mild obstructive CAD from recent coronary angiography, left bundle branch block, recurrent syncope with implantable loop recorder in the past and recently wore event monitor for a month which was unremarkable as well.  During her last visit with the patient on Feb 16, 2020 I did advise the patient that he would benefit from being evaluated by ENT.  At that time he was not happy about this recommendation.  In the interim he requested to switch providers to Dr. Geraldo [name].  I also did not receive a report from ENT noting that he was seen and his dizziness may be related to vestibular weakness.  The patient is here today for follow-up visit.  He also was admitted for stroke back in June she has Recovered well.  Past Medical History:  Diagnosis Date  . Bladder tumor   . Cervical stenosis of spine   . E. coli infection   . Essential hypertension 03/12/2020  . GERD (gastroesophageal reflux disease)   . History of appendectomy   . History of concussion    X2   NO RESIDUALS  . History of peptic ulcer 1970'S  . Hyperlipidemia   . Irregular heart beat   . LBBB (left bundle branch block)   . Status post placement of implantable loop recorder 11/08/2014    Past Surgical History:  Procedure Laterality Date  . ANTERIOR CERVICAL DECOMP/DISCECTOMY FUSION N/A 01/27/2018   Procedure: Anterior Cervical Discectomy Fusion - Cervical Four- Cervical Five;  Surgeon: Earnie Larsson, MD;  Location: [facility];  Service: Neurosurgery;  Laterality: N/A;   Anterior Cervical Discectomy Fusion - Cervical Four- Cervical Five  . APPENDECTOMY  1970  . CARDIOVASCULAR STRESS TEST  03/22/09  . CYSTOSCOPY WITH BIOPSY N/A 02/10/2013   Procedure: CYSTOSCOPY WITH COLD CUP BIOPSY/FULGERATION;  Surgeon: Bernestine Amass, MD;  Location: [facility];  Service: Urology;  Laterality: N/A;  ALSO FULGERATION   . INGUINAL HERNIA REPAIR Right 07-14-2002  . LEFT HEART CATH AND CORONARY ANGIOGRAPHY N/A 07/13/2019   Procedure: LEFT HEART CATH AND CORONARY ANGIOGRAPHY;  Surgeon: Troy Sine, MD;  Location: [facility];  Service: Cardiovascular;  Laterality: N/A;  . LOOP RECORDER IMPLANT N/A 05/24/2014   Procedure: LOOP RECORDER IMPLANT;  Surgeon: Sanda Klein, MD;  Location: [place] [facility];  Service: Cardiovascular;  Laterality: N/A;  . NECK SURGERY    . TONSILLECTOMY    . TRANSURETHRAL RESECTION OF BLADDER TUMOR  03-10-2008    Current Medications: Current Meds  Medication Sig  . aspirin EC 81 MG tablet Take 81 mg by mouth daily.  Marland Kitchen atorvastatin (LIPITOR) 40 MG tablet Take 1 tablet (40 mg total) by mouth daily.  . clopidogrel (PLAVIX) 75 MG tablet Take 1 tablet (75 mg total) by mouth daily.  . Glucosamine-Chondroit-Vit C-Mn (GLUCOSAMINE 1500 COMPLEX PO) Take 1 capsule by mouth daily.  Marland Kitchen MAGNESIUM PO Take 250 mg by mouth daily.   . metoprolol succinate (TOPROL-XL) 25  MG 24 hr tablet Take 12.5 mg by mouth daily.  . Olopatadine HCl 0.2 % SOLN Place 1 drop into both eyes as needed (Dry eye).   Marland Kitchen omeprazole (PRILOSEC) 20 MG capsule Take 20 mg by mouth every morning.  . Potassium 99 MG TABS Take 198 mg by mouth daily.      Allergies:   Patient has no known allergies.   Social History   Socioeconomic History  . Marital status: Married    Spouse name: Not on file  . Number of children: Not on file  . Years of education: Not on file  . Highest education level: Not on file  Occupational History  . Occupation: retired  Tobacco Use  . Smoking  status: Former Smoker    Packs/day: 0.50    Years: 30.00    Pack years: 15.00    Types: Cigarettes    Quit date: 02/06/1979    Years since quitting: 41.2  . Smokeless tobacco: Never Used  Vaping Use  . Vaping Use: Never used  Substance and Sexual Activity  . Alcohol use: Yes    Alcohol/week: 0.0 standard drinks    Comment: 1 glass of beer once or twice a month  . Drug use: No  . Sexual activity: Not on file  Other Topics Concern  . Not on file  Social History Narrative  . Not on file   Social Determinants of Health   Financial Resource Strain:   . Difficulty of Paying Living Expenses:   Food Insecurity:   . Worried About Charity fundraiser in the Last Year:   . Arboriculturist in the Last Year:   Transportation Needs:   . Film/video editor (Medical):   Marland Kitchen Lack of Transportation (Non-Medical):   Physical Activity:   . Days of Exercise per Week:   . Minutes of Exercise per Session:   Stress:   . Feeling of Stress :   Social Connections:   . Frequency of Communication with Friends and Family:   . Frequency of Social Gatherings with Friends and Family:   . Attends Religious Services:   . Active Member of Clubs or Organizations:   . Attends Archivist Meetings:   Marland Kitchen Marital Status:      Family History: The patient's family history includes Diabetes in his brother; Heart failure in his brother and mother; Stroke in his father.  ROS:   Review of Systems  Constitution: Negative for decreased appetite, fever and weight gain.  HENT: Negative for congestion, ear discharge, hoarse voice and sore throat.   Eyes: Negative for discharge, redness, vision loss in right eye and visual halos.  Cardiovascular: Negative for chest pain, dyspnea on exertion, leg swelling, orthopnea and palpitations.  Respiratory: Negative for cough, hemoptysis, shortness of breath and snoring.   Endocrine: Negative for heat intolerance and polyphagia.  Hematologic/Lymphatic: Negative for  bleeding problem. Does not bruise/bleed easily.  Skin: Negative for flushing, nail changes, rash and suspicious lesions.  Musculoskeletal: Negative for arthritis, joint pain, muscle cramps, myalgias, neck pain and stiffness.  Gastrointestinal: Negative for abdominal pain, bowel incontinence, diarrhea and excessive appetite.  Genitourinary: Negative for decreased libido, genital sores and incomplete emptying.  Neurological: Negative for brief paralysis, focal weakness, headaches and loss of balance.  Psychiatric/Behavioral: Negative for altered mental status, depression and suicidal ideas.  Allergic/Immunologic: Negative for HIV exposure and persistent infections.    EKGs/Labs/Other Studies Reviewed:    The following studies were reviewed today:  EKG:  The ekg ordered today demonstrates   Recent Labs: 06/29/2019: Magnesium 2.1 03/12/2020: ALT 10; BUN 28; Creatinine, Ser 1.10; Hemoglobin 13.6; Platelets 196; Potassium 5.1; Sodium 138; TSH 2.883  Recent Lipid Panel    Component Value Date/Time   CHOL 197 03/13/2020 0500   TRIG 97 03/13/2020 0500   HDL 51 03/13/2020 0500   CHOLHDL 3.9 03/13/2020 0500   VLDL 19 03/13/2020 0500   LDLCALC 127 (H) 03/13/2020 0500    Physical Exam:    VS:  BP 138/80 (BP Location: Right Arm, Patient Position: Sitting, Cuff Size: Normal)   Pulse 64   Ht 5' 7"  (1.702 m)   Wt 154 lb (69.9 kg)   SpO2 99%   BMI 24.12 kg/m     Wt Readings from Last 3 Encounters:  04/12/20 154 lb (69.9 kg)  03/24/20 155 lb (70.3 kg)  03/12/20 155 lb 6.8 oz (70.5 kg)     GEN: Well nourished, well developed in no acute distress HEENT: Normal NECK: No JVD; No carotid bruits LYMPHATICS: No lymphadenopathy CARDIAC: S1S2 noted,RRR, no murmurs, rubs, gallops RESPIRATORY:  Clear to auscultation without rales, wheezing or rhonchi  ABDOMEN: Soft, non-tender, non-distended, +bowel sounds, no guarding. EXTREMITIES: No edema, No cyanosis, no clubbing MUSCULOSKELETAL:  No  deformity  SKIN: Warm and dry NEUROLOGIC:  Alert and oriented x 3, non-focal PSYCHIATRIC:  Normal affect, good insight  ASSESSMENT:    1. Essential hypertension   2. Coronary artery disease involving native coronary artery of native heart without angina pectoris   3. Atrial tachycardia ([place])   4. History of CVA (cerebrovascular accident)   5. Bilateral leg edema    PLAN:    He does have bilateral leg edema I am going to start the patient on Lasix 20 mg Tuesday Thursdays and Saturdays.  He already takes potassium supplement therefore will not be repeating this.  In terms of his recent stroke he has recovered very well he was discharged from PT yesterday.  We will continue his aspirin, Plavix and atorvastatin.   His monitor results were previously sent to him, I reviewed this again with the patient.  He will see Dr. Geraldo [name] in 1 month.   Medication Adjustments/Labs and Tests Ordered: Current medicines are reviewed at length with the patient today.  Concerns regarding medicines are outlined above.  No orders of the defined types were placed in this encounter.  Meds ordered this encounter  Medications  . furosemide (LASIX) 20 MG tablet    Sig: Take 20 mg (1 tablet) on Tuesday, Thursday and Saturdays.    Dispense:  30 tablet    Refill:  3    Patient Instructions  Medication Instructions:  Your physician has recommended you make the following change in your medication:   Take 20 mg Lasix on Tuesday, Thursday and Saturday.  *If you need a refill on your cardiac medications before your next appointment, please call your pharmacy*   Lab Work: None ordered If you have labs (blood work) drawn today and your tests are completely normal, you will receive your results only by: Marland Kitchen MyChart Message (if you have MyChart) OR . A paper copy in the mail If you have any lab test that is abnormal or we need to change your treatment, we will call you to review the  results.   Testing/Procedures: None ordered   Follow-Up: At [facility], you and your health needs are our priority.  As part of our continuing mission to provide you with  exceptional heart care, we have created designated Provider Care Teams.  These Care Teams include your primary Cardiologist (physician) and Advanced Practice Providers (APPs -  Physician Assistants and Nurse Practitioners) who all work together to provide you with the care you need, when you need it.  We recommend signing up for the patient portal called "MyChart".  Sign up information is provided on this After Visit Summary.  MyChart is used to connect with patients for Virtual Visits (Telemedicine).  Patients are able to view lab/test results, encounter notes, upcoming appointments, etc.  Non-urgent messages can be sent to your provider as well.   To learn more about what you can do with MyChart, go to [email or web address].    Your next appointment:   1 month(s)  The format for your next appointment:   In Person  Provider:   Jyl Heinz, MD   Other Instructions Furosemide Oral Tablets What is this medicine? FUROSEMIDE (fyoor [place] se mide) is a diuretic. It helps you make more urine and to lose salt and excess water from your body. It treats swelling from heart, kidney, or liver disease. It also treats high blood pressure. This medicine may be used for other purposes; ask your health care provider or pharmacist if you have questions. COMMON BRAND NAME(S): Active-Medicated Specimen Kit, Delone, Diuscreen, Lasix, RX Specimen Collection Kit, Specimen Collection Kit, URINX Medicated Specimen Collection What should I tell my health care provider before I take this medicine? They need to know if you have any of these conditions:  abnormal blood electrolytes  diarrhea or vomiting  gout  heart disease  kidney disease, small amounts of urine, or difficulty passing urine  liver disease  thyroid  disease  an unusual or allergic reaction to furosemide, sulfa drugs, other medicines, foods, dyes, or preservatives  pregnant or trying to get pregnant  breast-feeding How should I use this medicine? Take this drug by mouth. Take it as directed on the prescription label at the same time every day. You can take it with or without food. If it upsets your stomach, take it with food. Keep taking it unless your health care provider tells you to stop. Talk to your health care provider about the use of this drug in children. Special care may be needed. Overdosage: If you think you have taken too much of this medicine contact a poison control center or emergency room at once. NOTE: This medicine is only for you. Do not share this medicine with others. What if I miss a dose? If you miss a dose, take it as soon as you can. If it is almost time for your next dose, take only that dose. Do not take double or extra doses. What may interact with this medicine?  aspirin and aspirin-like medicines  certain antibiotics  chloral hydrate  cisplatin  cyclosporine  digoxin  diuretics  laxatives  lithium  medicines for blood pressure  medicines that relax muscles for surgery  methotrexate  NSAIDs, medicines for pain and inflammation like ibuprofen, naproxen, or indomethacin  phenytoin  steroid medicines like prednisone or cortisone  sucralfate  thyroid hormones This list may not describe all possible interactions. Give your health care provider a list of all the medicines, herbs, non-prescription drugs, or dietary supplements you use. Also tell them if you smoke, drink alcohol, or use illegal drugs. Some items may interact with your medicine. What should I watch for while using this medicine? Visit your doctor or health care provider for regular checks on your  progress. Check your blood pressure regularly. Ask your doctor or health care provider what your blood pressure should be, and  when you should contact him or her. If you are a diabetic, check your blood sugar as directed. This medicine may cause serious skin reactions. They can happen weeks to months after starting the medicine. Contact your health care provider right away if you notice fevers or flu-like symptoms with a rash. The rash may be red or purple and then turn into blisters or peeling of the skin. Or, you might notice a red rash with swelling of the face, lips or lymph nodes in your neck or under your arms. You may need to be on a special diet while taking this medicine. Check with your doctor. Also, ask how many glasses of fluid you need to drink a day. You must not get dehydrated. You may get drowsy or dizzy. Do not drive, use machinery, or do anything that needs mental alertness until you know how this drug affects you. Do not stand or sit up quickly, especially if you are an older patient. This reduces the risk of dizzy or fainting spells. Alcohol can make you more drowsy and dizzy. Avoid alcoholic drinks. This medicine can make you more sensitive to the sun. Keep out of the sun. If you cannot avoid being in the sun, wear protective clothing and use sunscreen. Do not use sun lamps or tanning beds/booths. What side effects may I notice from receiving this medicine? Side effects that you should report to your doctor or health care professional as soon as possible:  blood in urine or stools  dry mouth  fever or chills  hearing loss or ringing in the ears  irregular heartbeat  muscle pain or weakness, cramps  rash, fever, and swollen lymph nodes  redness, blistering, peeling or loosening of the skin, including inside the mouth  skin rash  stomach upset, pain, or nausea  tingling or numbness in the hands or feet  unusually weak or tired  vomiting or diarrhea  yellowing of the eyes or skin Side effects that usually do not require medical attention (report to your doctor or health care professional  if they continue or are bothersome):  headache  loss of appetite  unusual bleeding or bruising This list may not describe all possible side effects. Call your doctor for medical advice about side effects. You may report side effects to FDA at 1-800-FDA-1088. Where should I keep my medicine? Keep out of the reach of children and pets. Store at room temperature between 20 and 25 degrees C (68 and 77 degrees F). Protect from light and moisture. Keep the container tightly closed. Throw away any unused drug after the expiration date. NOTE: This sheet is a summary. It may not cover all possible information. If you have questions about this medicine, talk to your doctor, pharmacist, or health care provider.  2020 Elsevier/Gold Standard (2019-04-27 18:01:32)      Adopting a Healthy Lifestyle.  Know what a healthy weight is for you (roughly BMI <25) and aim to maintain this   Aim for 7+ servings of fruits and vegetables daily   65-80+ fluid ounces of water or unsweet tea for healthy kidneys   Limit to max 1 drink of alcohol per day; avoid smoking/tobacco   Limit animal fats in diet for cholesterol and heart health - choose grass fed whenever available   Avoid highly processed foods, and foods high in saturated/trans fats   Aim for low  stress - take time to unwind and care for your mental health   Aim for 150 min of moderate intensity exercise weekly for heart health, and weights twice weekly for bone health   Aim for 7-9 hours of sleep daily   When it comes to diets, agreement about the perfect plan isnt easy to find, even among the experts. Experts at the [employer] developed an idea known as the Healthy Eating Plate. Just imagine a plate divided into logical, healthy portions.   The emphasis is on diet quality:   Load up on vegetables and fruits - one-half of your plate: Aim for color and variety, and remember that potatoes dont count.   Go for whole  grains - one-quarter of your plate: Whole wheat, barley, wheat berries, quinoa, oats, brown rice, and foods made with them. If you want pasta, go with whole wheat pasta.   Protein power - one-quarter of your plate: Fish, chicken, beans, and nuts are all healthy, versatile protein sources. Limit red meat.   The diet, however, does go beyond the plate, offering a few other suggestions.   Use healthy plant oils, such as olive, canola, soy, corn, sunflower and peanut. Check the labels, and avoid partially hydrogenated oil, which have unhealthy trans fats.   If youre thirsty, drink water. Coffee and tea are good in moderation, but skip sugary drinks and limit milk and dairy products to one or two daily servings.   The type of carbohydrate in the diet is more important than the amount. Some sources of carbohydrates, such as vegetables, fruits, whole grains, and beans-are healthier than others.   Finally, stay active  Signed, Berniece Salines, DO  04/12/2020 11:27 AM    [facility]

## 2020-04-12 NOTE — Patient Instructions (Signed)
Medication Instructions:  Your physician has recommended you make the following change in your medication:   Take 20 mg Lasix on Tuesday, Thursday and Saturday.  *If you need a refill on your cardiac medications before your next appointment, please call your pharmacy*   Lab Work: None ordered If you have labs (blood work) drawn today and your tests are completely normal, you will receive your results only by: Marland Kitchen MyChart Message (if you have MyChart) OR . A paper copy in the mail If you have any lab test that is abnormal or we need to change your treatment, we will call you to review the results.   Testing/Procedures: None ordered   Follow-Up: At [facility], you and your health needs are our priority.  As part of our continuing mission to provide you with exceptional heart care, we have created designated Provider Care Teams.  These Care Teams include your primary Cardiologist (physician) and Advanced Practice Providers (APPs -  Physician Assistants and Nurse Practitioners) who all work together to provide you with the care you need, when you need it.  We recommend signing up for the patient portal called "MyChart".  Sign up information is provided on this After Visit Summary.  MyChart is used to connect with patients for Virtual Visits (Telemedicine).  Patients are able to view lab/test results, encounter notes, upcoming appointments, etc.  Non-urgent messages can be sent to your provider as well.   To learn more about what you can do with MyChart, go to [email or web address].    Your next appointment:   1 month(s)  The format for your next appointment:   In Person  Provider:   Jyl Heinz, MD   Other Instructions Furosemide Oral Tablets What is this medicine? FUROSEMIDE (fyoor OH se mide) is a diuretic. It helps you make more urine and to lose salt and excess water from your body. It treats swelling from heart, kidney, or liver disease. It also treats high blood  pressure. This medicine may be used for other purposes; ask your health care provider or pharmacist if you have questions. COMMON BRAND NAME(S): Active-Medicated Specimen Kit, Delone, Diuscreen, Lasix, RX Specimen Collection Kit, Specimen Collection Kit, URINX Medicated Specimen Collection What should I tell my health care provider before I take this medicine? They need to know if you have any of these conditions:  abnormal blood electrolytes  diarrhea or vomiting  gout  heart disease  kidney disease, small amounts of urine, or difficulty passing urine  liver disease  thyroid disease  an unusual or allergic reaction to furosemide, sulfa drugs, other medicines, foods, dyes, or preservatives  pregnant or trying to get pregnant  breast-feeding How should I use this medicine? Take this drug by mouth. Take it as directed on the prescription label at the same time every day. You can take it with or without food. If it upsets your stomach, take it with food. Keep taking it unless your health care provider tells you to stop. Talk to your health care provider about the use of this drug in children. Special care may be needed. Overdosage: If you think you have taken too much of this medicine contact a poison control center or emergency room at once. NOTE: This medicine is only for you. Do not share this medicine with others. What if I miss a dose? If you miss a dose, take it as soon as you can. If it is almost time for your next dose, take only that dose. Do not take  double or extra doses. What may interact with this medicine?  aspirin and aspirin-like medicines  certain antibiotics  chloral hydrate  cisplatin  cyclosporine  digoxin  diuretics  laxatives  lithium  medicines for blood pressure  medicines that relax muscles for surgery  methotrexate  NSAIDs, medicines for pain and inflammation like ibuprofen, naproxen, or indomethacin  phenytoin  steroid medicines  like prednisone or cortisone  sucralfate  thyroid hormones This list may not describe all possible interactions. Give your health care provider a list of all the medicines, herbs, non-prescription drugs, or dietary supplements you use. Also tell them if you smoke, drink alcohol, or use illegal drugs. Some items may interact with your medicine. What should I watch for while using this medicine? Visit your doctor or health care provider for regular checks on your progress. Check your blood pressure regularly. Ask your doctor or health care provider what your blood pressure should be, and when you should contact him or her. If you are a diabetic, check your blood sugar as directed. This medicine may cause serious skin reactions. They can happen weeks to months after starting the medicine. Contact your health care provider right away if you notice fevers or flu-like symptoms with a rash. The rash may be red or purple and then turn into blisters or peeling of the skin. Or, you might notice a red rash with swelling of the face, lips or lymph nodes in your neck or under your arms. You may need to be on a special diet while taking this medicine. Check with your doctor. Also, ask how many glasses of fluid you need to drink a day. You must not get dehydrated. You may get drowsy or dizzy. Do not drive, use machinery, or do anything that needs mental alertness until you know how this drug affects you. Do not stand or sit up quickly, especially if you are an older patient. This reduces the risk of dizzy or fainting spells. Alcohol can make you more drowsy and dizzy. Avoid alcoholic drinks. This medicine can make you more sensitive to the sun. Keep out of the sun. If you cannot avoid being in the sun, wear protective clothing and use sunscreen. Do not use sun lamps or tanning beds/booths. What side effects may I notice from receiving this medicine? Side effects that you should report to your doctor or health care  professional as soon as possible:  blood in urine or stools  dry mouth  fever or chills  hearing loss or ringing in the ears  irregular heartbeat  muscle pain or weakness, cramps  rash, fever, and swollen lymph nodes  redness, blistering, peeling or loosening of the skin, including inside the mouth  skin rash  stomach upset, pain, or nausea  tingling or numbness in the hands or feet  unusually weak or tired  vomiting or diarrhea  yellowing of the eyes or skin Side effects that usually do not require medical attention (report to your doctor or health care professional if they continue or are bothersome):  headache  loss of appetite  unusual bleeding or bruising This list may not describe all possible side effects. Call your doctor for medical advice about side effects. You may report side effects to FDA at 1-800-FDA-1088. Where should I keep my medicine? Keep out of the reach of children and pets. Store at room temperature between 20 and 25 degrees C (68 and 77 degrees F). Protect from light and moisture. Keep the container tightly closed. Throw away  any unused drug after the expiration date. NOTE: This sheet is a summary. It may not cover all possible information. If you have questions about this medicine, talk to your doctor, pharmacist, or health care provider.  2020 Elsevier/Gold Standard (2019-04-27 18:01:32)

## 2020-04-18 ENCOUNTER — Other Ambulatory Visit: Payer: Self-pay | Admitting: *Deleted

## 2020-04-18 ENCOUNTER — Other Ambulatory Visit: Payer: Self-pay

## 2020-04-18 ENCOUNTER — Encounter: Payer: Self-pay | Admitting: *Deleted

## 2020-04-18 MED ORDER — CLOPIDOGREL BISULFATE 75 MG PO TABS: 75.0000 mg | ORAL_TABLET | Freq: Every day | ORAL | 3 refills | Status: DC

## 2020-04-18 NOTE — Telephone Encounter (Signed)
This is a HP pt 

## 2020-04-18 NOTE — Patient Outreach (Signed)
[facility]) Care Management  04/18/2020  Oscar [name] Jan 29, 1931 [id number]   Subjective: Telephone call to patient's home number, spoke with patient, and HIPAA verified.  Discussed [place] Medicare EMMI Stroke Red Flag Alert follow up, patient voiced understanding, and is in agreement to follow up.  Patient states he does not remember receiving EMMI automated calls.  Patient states he is recovering fairly well, most movements are back to normal, some movements are still slower than he would like, and he easily fatigues. States he continues to manage his fatigue with rest periods as needed.  States there was a question regarding length of Plavix therapy, his local pharmacist, and his provider are working to address. Patient states he is able to manage self care and has assistance as needed. Patient voices understanding of medical diagnosis and treatment plan.  States he is accessing his [employer] benefits as needed via member services number on back of card. Discussed Advanced Directives, advised of [employer] document scanning  system via local providers office or hospitals, patient voices understanding, and he will discuss accessing through patient's primary provider if needed in the future.   Patient states he does not have any education material, EMMI follow up, care coordination, care management, disease monitoring, transportation, community resource, or pharmacy needs at this time.  States he is very appreciative of the follow up, is in agreement  to receive 1 additional follow up call to assess for further CM needs, and is in agreement to receive [place] [facility] EMMI follow up calls as needed.     Objective: Per KPN (Knowledge Performance Now, point of care tool) and chart review, patient hospitalized 03/12/2020 - 03/14/2020 for CVA.   Patient also has a history of hyperlipidemia,  hypothyroidism, left bundle branch block with loop  recorder, atrial fibrillation, Atrial tachycardia, paroxysmal , chronic mild expressive aphasia, and hypertension.      Assessment: Received [employer] EMMI Stroke Google Alert follow up referral on 03/30/2020.   Red Flag Alert Triggers, times 2, Day #13, patient answered no to the following questions: Went to follow-up appointment?  Scheduled a follow-up appointment?    EMMI follow up completed and will follow up to assess further care management needs.      Plan:  RNCM will call patient for telephone outreach attempt, within 30 business days, EMMI follow up, to assess for further CM needs, and proceed with case closure, after 4th unsuccessful outreach call.     Carra Brindley H. Annia Friendly, BSN, [place] [facility] Telephonic CM Phone: [phone] Fax: [phone]

## 2020-04-19 ENCOUNTER — Encounter: Payer: Self-pay | Admitting: Adult Health

## 2020-04-19 ENCOUNTER — Ambulatory Visit (INDEPENDENT_AMBULATORY_CARE_PROVIDER_SITE_OTHER): Payer: Medicare Other | Admitting: Adult Health

## 2020-04-19 VITALS — BP 113/66 | HR 75 | Ht 67.0 in | Wt 155.0 lb

## 2020-04-19 DIAGNOSIS — I639 Cerebral infarction, unspecified: Secondary | ICD-10-CM

## 2020-04-19 DIAGNOSIS — I10 Essential (primary) hypertension: Secondary | ICD-10-CM

## 2020-04-19 DIAGNOSIS — E785 Hyperlipidemia, unspecified: Secondary | ICD-10-CM | POA: Diagnosis not present

## 2020-04-19 NOTE — Progress Notes (Signed)
[facility] [address] [place]. [place] [address] [phone]       HOSPITAL FOLLOW UP NOTE  [name] Date of Birth:  Mar 27, 1931 Medical Record Number:  [id number]   Reason for Referral:  hospital stroke follow up    SUBJECTIVE:   CHIEF COMPLAINT:  Chief Complaint  Patient presents with  . Follow-up    tx rm here for a f/u from a stroke. Pt is having no new sx.    HPI:   [name] is a 86 y.o. male with history of bladder tumor, cervical stenosis, concussions x 2, HLD and loop recorder placement who presented on 03/12/2020 with R sided weakness.  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  Recommend DAPT for 3 weeks then Plavix alone.  HTN stable.  LDL 127 and initiate atorvastatin 40 mg daily.  Other stroke risk factors include advanced age, former tobacco use, EtOH use and family history of stroke but no prior personal history. Hx of possible AF but not documented with placement of loop recorder in 2016 for LBBB.  No recommended AC unless embolic source is clearly identified -recommended consideration of [place] stroke research study.  Evaluated by therapy and recommended discharge home with [facility] OT and discharged home in stable condition.  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 Small vessel disease. Atrophy. ASPECTS 10.     CTA head & neck no LVO. Aortic arch and ICA bifurcation atherosclerosis. Emphysema.   CT perfusion negative   MRI  L PLIC infarct extending into corona radiata. Small vessel disease. Atrophy.   2D Echo EF 55-60%. No source of embolus   LDL 127  HgbA1c pending   Lovenox 40 mg sq daily for VTE prophylaxis  aspirin 81 mg daily prior to admission, now on aspirin 81 mg daily and clopidogrel 75 mg daily. Continue DAPT x 3 weeks then Plavix alone. Do not recommend [facility] unless an embolic source  is clearly identified. Consider PACIFIC-STROKE research study.   Therapy recommendations:  HH OT  Disposition:   Home   Today, 04/19/2020, Oscar [name] is being seen for hospital follow-up.  He has been doing well since discharge without residual deficits or new stroke/TIA symptoms.  Recommended DAPT for 3 weeks but has continued on both aspirin and Plavix with mild bruising but no bleeding.  Continues on atorvastatin without myalgias.  Blood pressure today 113/66.  He continues to participate in [place] stroke research study with [place] research team.  No concerns at this time.    ROS:   14 system review of systems performed and negative with exception of no complaints  PMH:  Past Medical History:  Diagnosis Date  . Bladder tumor   . Cervical stenosis of spine   . E. coli infection   . Essential hypertension 03/12/2020  . GERD (gastroesophageal reflux disease)   . History of appendectomy   . History of concussion    X2   NO RESIDUALS  . History of peptic ulcer 1970'S  . Hyperlipidemia   . Irregular heart beat   . LBBB (left bundle branch block)   . Status post placement of implantable loop recorder 11/08/2014    PSH:  Past Surgical History:  Procedure Laterality Date  . ANTERIOR CERVICAL DECOMP/DISCECTOMY FUSION N/A 01/27/2018   Procedure: Anterior Cervical Discectomy Fusion - Cervical Four- Cervical Five;  Surgeon:  Earnie Larsson, MD;  Location: [facility];  Service: Neurosurgery;  Laterality: N/A;  Anterior Cervical Discectomy Fusion - Cervical Four- Cervical Five  . APPENDECTOMY  1970  . CARDIOVASCULAR STRESS TEST  03/22/09  . CYSTOSCOPY WITH BIOPSY N/A 02/10/2013   Procedure: CYSTOSCOPY WITH COLD CUP BIOPSY/FULGERATION;  Surgeon: Bernestine Amass, MD;  Location[facility];  Service: Urology;  Laterality: N/A;  ALSO FULGERATION   . INGUINAL HERNIA REPAIR Right 07-14-2002  . LEFT HEART CATH AND CORONARY ANGIOGRAPHY N/A 07/13/2019   Procedure: LEFT HEART CATH AND CORONARY  ANGIOGRAPHY;  Surgeon: Troy Sine, MD;  Location: [facility];  Service: Cardiovascular;  Laterality: N/A;  . LOOP RECORDER IMPLANT N/A 05/24/2014   Procedure: LOOP RECORDER IMPLANT;  Surgeon: Sanda Klein, MD;  Location: [facility];  Service: Cardiovascular;  Laterality: N/A;  . NECK SURGERY    . TONSILLECTOMY    . TRANSURETHRAL RESECTION OF BLADDER TUMOR  03-10-2008    Social History:  Social History   Socioeconomic History  . Marital status: Married    Spouse name: Not on file  . Number of children: Not on file  . Years of education: Not on file  . Highest education level: Not on file  Occupational History  . Occupation: retired  Tobacco Use  . Smoking status: Former Smoker    Packs/day: 0.50    Years: 30.00    Pack years: 15.00    Types: Cigarettes    Quit date: 02/06/1979    Years since quitting: 41.2  . Smokeless tobacco: Never Used  Vaping Use  . Vaping Use: Never used  Substance and Sexual Activity  . Alcohol use: Yes    Alcohol/week: 0.0 standard drinks    Comment: 1 glass of beer once or twice a month  . Drug use: No  . Sexual activity: Not on file  Other Topics Concern  . Not on file  Social History Narrative  . Not on file   Social Determinants of Health   Financial Resource Strain:   . Difficulty of Paying Living Expenses:   Food Insecurity:   . Worried About Charity fundraiser in the Last Year:   . Arboriculturist in the Last Year:   Transportation Needs: No Transportation Needs  . Lack of Transportation (Medical): No  . Lack of Transportation (Non-Medical): No  Physical Activity:   . Days of Exercise per Week:   . Minutes of Exercise per Session:   Stress:   . Feeling of Stress :   Social Connections:   . Frequency of Communication with Friends and Family:   . Frequency of Social Gatherings with Friends and Family:   . Attends Religious Services:   . Active Member of Clubs or Organizations:   . Attends Archivist  Meetings:   Marland Kitchen Marital Status:   Intimate Partner Violence:   . Fear of Current or Ex-Partner:   . Emotionally Abused:   Marland Kitchen Physically Abused:   . Sexually Abused:     Family History:  Family History  Problem Relation Age of Onset  . Heart failure Mother   . Stroke Father   . Diabetes Brother   . Heart failure Brother     Medications:   Current Outpatient Medications on File Prior to Visit  Medication Sig Dispense Refill  . aspirin EC 81 MG tablet Take 81 mg by mouth daily.    Marland Kitchen atorvastatin (LIPITOR) 40 MG tablet Take 1 tablet (  40 mg total) by mouth daily. 30 tablet 1  . clopidogrel (PLAVIX) 75 MG tablet Take 1 tablet (75 mg total) by mouth daily. 90 tablet 3  . furosemide (LASIX) 20 MG tablet Take 20 mg (1 tablet) on Tuesday, Thursday and Saturdays. 30 tablet 3  . Glucosamine-Chondroit-Vit C-Mn (GLUCOSAMINE 1500 COMPLEX PO) Take 1 capsule by mouth daily.    Marland Kitchen MAGNESIUM PO Take 250 mg by mouth daily.     . metoprolol succinate (TOPROL-XL) 25 MG 24 hr tablet Take 12.5 mg by mouth daily.    . Olopatadine HCl 0.2 % SOLN Place 1 drop into both eyes as needed (Dry eye).     Marland Kitchen omeprazole (PRILOSEC) 20 MG capsule Take 20 mg by mouth every morning.    . Potassium 99 MG TABS Take 198 mg by mouth daily.      No current facility-administered medications on file prior to visit.    Allergies:  No Known Allergies    OBJECTIVE:  Physical Exam  Vitals:   04/19/20 1305  BP: 113/66  Pulse: 75  Weight: 155 lb (70.3 kg)  Height: 5\' 7"  (1.702 m)   Body mass index is 24.28 kg/m. No exam data present  General: well developed, well nourished,  very [name], seated, in no evident distress Head: head normocephalic and atraumatic.   Neck: supple with no carotid or supraclavicular bruits Cardiovascular: regular rate and rhythm, no murmurs Musculoskeletal: no deformity Skin:  no rash/petichiae Vascular:  Normal pulses all extremities   Neurologic Exam Mental  Status: Awake and fully alert.   Fluent speech and language.  Oriented to place and time. Recent and remote memory intact. Attention span, concentration and fund of knowledge appropriate. Mood and affect appropriate.  Cranial Nerves: Fundoscopic exam reveals sharp disc margins. Pupils equal, briskly reactive to light. Extraocular movements full without nystagmus. Visual fields full to confrontation. Hearing intact. Facial sensation intact. Face, tongue, palate moves normally and symmetrically.  Motor: Normal bulk and tone. Normal strength in all tested extremity muscles. Sensory.: intact to touch , pinprick , position and vibratory sensation.  Coordination: Rapid alternating movements normal in all extremities. Finger-to-nose and heel-to-shin performed accurately bilaterally. Gait and Station: Arises from chair without difficulty. Stance is normal. Gait demonstrates normal stride length and balance Reflexes: 1+ and symmetric. Toes downgoing.     NIHSS  0 Modified Rankin  0     ASSESSMENT: Oscar [name] is a 86 y.o. 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 Vascular risk factors include HTN, HLD, possible AF hx (loop recorder 2016 for syncopal events).     PLAN:  1. L PLIC stroke :  -Residual deficit: Recovered well without residual deficits.  -Continue clopidogrel 75 mg daily  and atorvastatin for secondary stroke prevention.   -Advised to discontinue aspirin as 3 weeks DAPT completed and prolonged DAPT not indicated. -Close PCP follow up for aggressive stroke risk factor management  2. HTN: BP goal <130/90.  Stable today.  Continue f/u with PCP 3. HLD: LDL goal <70. Recent LDL 127. Continue atorvastatin and F/u with PCP for management as well as prescribing of statin   Currently being followed closely by [place] research team participating in [place] stroke trial therefore can follow-up  in clinic as needed   I spent 45 minutes of face-to-face and non-face-to-face time with patient.  This included previsit chart review, lab review, study review, order  entry, electronic health record documentation, patient education regarding recent stroke, importance of managing stroke risk factors and answered all questions to patient satisfaction     Frann Rider, [facility]  [facility] [address] [place], [place] [address]  Phone [phone] Fax [phone] Note: This document was prepared with digital dictation and possible smart phrase technology. Any transcriptional errors that result from this process are unintentional.

## 2020-04-19 NOTE — Patient Instructions (Signed)
Continue clopidogrel 75 mg daily  and atorvastatin for secondary stroke prevention  Discontinue aspirin as both aspirin and Plavix are not indicated for stroke prevention  Continue to follow up with PCP regarding cholesterol and blood pressure management  Maintain strict control of hypertension with blood pressure goal below 130/90, diabetes with hemoglobin A1c goal below 6.5% and cholesterol with LDL cholesterol (bad cholesterol) goal below 70 mg/dL.    You will continue to follow with [place] neurologic research team with phone visit scheduled in mid August and in person visit mid-September -you will be called around those times for reminders          Thank you for coming to see Oscar [name] at [facility]. I hope we have been able to provide you high quality care today.  You may receive a patient satisfaction survey over the next few weeks. We would appreciate your feedback and comments so that we may continue to improve ourselves and the health of our patients.

## 2020-04-20 NOTE — Progress Notes (Signed)
I agree with the above plan 

## 2020-05-15 ENCOUNTER — Ambulatory Visit (INDEPENDENT_AMBULATORY_CARE_PROVIDER_SITE_OTHER): Payer: Medicare Other | Admitting: Cardiology

## 2020-05-15 ENCOUNTER — Encounter: Payer: Self-pay | Admitting: Cardiology

## 2020-05-15 ENCOUNTER — Other Ambulatory Visit: Payer: Self-pay

## 2020-05-15 VITALS — BP 122/68 | HR 73 | Ht 67.0 in | Wt 155.1 lb

## 2020-05-15 DIAGNOSIS — I2510 Atherosclerotic heart disease of native coronary artery without angina pectoris: Secondary | ICD-10-CM | POA: Diagnosis not present

## 2020-05-15 DIAGNOSIS — I10 Essential (primary) hypertension: Secondary | ICD-10-CM | POA: Diagnosis not present

## 2020-05-15 DIAGNOSIS — E782 Mixed hyperlipidemia: Secondary | ICD-10-CM | POA: Diagnosis not present

## 2020-05-15 DIAGNOSIS — E039 Hypothyroidism, unspecified: Secondary | ICD-10-CM | POA: Diagnosis not present

## 2020-05-15 NOTE — Progress Notes (Signed)
Cardiology Office Note:    Date:  05/15/2020   ID:  Oscar [name], DOB May 05, 1931, MRN [id number]  PCP:  Berkley Harvey, NP  Cardiologist:  Jenean Lindau, MD   Referring MD: Berkley Harvey, NP    ASSESSMENT:    1. Coronary artery disease involving native coronary artery of native heart without angina pectoris   2. Essential hypertension   3. Mixed hyperlipidemia    PLAN:    In order of problems listed above:  1. Primary prevention stressed with the patient.  Importance of compliance with diet medication stressed and he vocalized understanding.  I appreciated his exercise protocol and encouraged him to continue so. 2. Essential hypertension: Blood pressure is stable 3. Mixed dyslipidemia: Diet was emphasized.  He'll have blood work today. 4. History of stroke: Stable and managed by his neurologist and patient is on clopidogrel. 5. He will have blood work today including lipids.Patient will be seen in follow-up appointment in 6 months or earlier if the patient has any concerns    Medication Adjustments/Labs and Tests Ordered: Current medicines are reviewed at length with the patient today.  Concerns regarding medicines are outlined above.  No orders of the defined types were placed in this encounter.  No orders of the defined types were placed in this encounter.    No chief complaint on file.    History of Present Illness:    Oscar [name] is a 86 y.o. male.  Patient has past medical history of bradycardia, dyslipidemia and stroke.  He denies any problems at this time and takes care of activities of daily living.  No chest pain orthopnea or PND.  He is very active and exercises age appropriately or even better.  No dizziness palpitations or syncope.  At the time of my evaluation, the patient is alert awake oriented and in no distress.  Past Medical History:  Diagnosis Date  . Bladder tumor   . Cervical stenosis of spine   . E. coli infection   . Essential  hypertension 03/12/2020  . GERD (gastroesophageal reflux disease)   . History of appendectomy   . History of concussion    X2   NO RESIDUALS  . History of peptic ulcer 1970'S  . Hyperlipidemia   . Irregular heart beat   . LBBB (left bundle branch block)   . Status post placement of implantable loop recorder 11/08/2014    Past Surgical History:  Procedure Laterality Date  . ANTERIOR CERVICAL DECOMP/DISCECTOMY FUSION N/A 01/27/2018   Procedure: Anterior Cervical Discectomy Fusion - Cervical Four- Cervical Five;  Surgeon: Earnie Larsson, MD;  Location: [place];  Service: Neurosurgery;  Laterality: N/A;  Anterior Cervical Discectomy Fusion - Cervical Four- Cervical Five  . APPENDECTOMY  1970  . CARDIOVASCULAR STRESS TEST  03/22/09  . CYSTOSCOPY WITH BIOPSY N/A 02/10/2013   Procedure: CYSTOSCOPY WITH COLD CUP BIOPSY/FULGERATION;  Surgeon: Bernestine Amass, MD;  Location: [facility];  Service: Urology;  Laterality: N/A;  ALSO FULGERATION   . INGUINAL HERNIA REPAIR Right 07-14-2002  . LEFT HEART CATH AND CORONARY ANGIOGRAPHY N/A 07/13/2019   Procedure: LEFT HEART CATH AND CORONARY ANGIOGRAPHY;  Surgeon: Troy Sine, MD;  Location: [place] [facility];  Service: Cardiovascular;  Laterality: N/A;  . LOOP RECORDER IMPLANT N/A 05/24/2014   Procedure: LOOP RECORDER IMPLANT;  Surgeon: Sanda Klein, MD;  Location: [place] [facility];  Service: Cardiovascular;  Laterality: N/A;  . NECK SURGERY    . TONSILLECTOMY    .  TRANSURETHRAL RESECTION OF BLADDER TUMOR  03-10-2008    Current Medications: Current Meds  Medication Sig  . atorvastatin (LIPITOR) 40 MG tablet Take 1 tablet (40 mg total) by mouth daily.  . clopidogrel (PLAVIX) 75 MG tablet Take 1 tablet (75 mg total) by mouth daily.  . furosemide (LASIX) 20 MG tablet Take 20 mg (1 tablet) on Tuesday, Thursday and Saturdays.  . Glucosamine-Chondroit-Vit C-Mn (GLUCOSAMINE 1500 COMPLEX PO) Take 1 capsule by mouth daily.  Marland Kitchen MAGNESIUM PO Take 250 mg  by mouth daily.   . metoprolol succinate (TOPROL-XL) 25 MG 24 hr tablet Take 12.5 mg by mouth daily.  . Olopatadine HCl 0.2 % SOLN Place 1 drop into both eyes as needed (Dry eye).   Marland Kitchen omeprazole (PRILOSEC) 20 MG capsule Take 20 mg by mouth every morning.  . Potassium 99 MG TABS Take 198 mg by mouth daily.      Allergies:   Patient has no known allergies.   Social History   Socioeconomic History  . Marital status: Married    Spouse name: Not on file  . Number of children: Not on file  . Years of education: Not on file  . Highest education level: Not on file  Occupational History  . Occupation: retired  Tobacco Use  . Smoking status: Former Smoker    Packs/day: 0.50    Years: 30.00    Pack years: 15.00    Types: Cigarettes    Quit date: 02/06/1979    Years since quitting: 41.2  . Smokeless tobacco: Never Used  Vaping Use  . Vaping Use: Never used  Substance and Sexual Activity  . Alcohol use: Yes    Alcohol/week: 0.0 standard drinks    Comment: 1 glass of beer once or twice a month  . Drug use: No  . Sexual activity: Not on file  Other Topics Concern  . Not on file  Social History Narrative  . Not on file   Social Determinants of Health   Financial Resource Strain:   . Difficulty of Paying Living Expenses: Not on file  Food Insecurity:   . Worried About Charity fundraiser in the Last Year: Not on file  . Ran Out of Food in the Last Year: Not on file  Transportation Needs: No Transportation Needs  . Lack of Transportation (Medical): No  . Lack of Transportation (Non-Medical): No  Physical Activity:   . Days of Exercise per Week: Not on file  . Minutes of Exercise per Session: Not on file  Stress:   . Feeling of Stress : Not on file  Social Connections:   . Frequency of Communication with Friends and Family: Not on file  . Frequency of Social Gatherings with Friends and Family: Not on file  . Attends Religious Services: Not on file  . Active Member of Clubs or  Organizations: Not on file  . Attends Archivist Meetings: Not on file  . Marital Status: Not on file     Family History: The patient's family history includes Diabetes in his brother; Heart failure in his brother and mother; Stroke in his father.  ROS:   Please see the history of present illness.    All other systems reviewed and are negative.  EKGs/Labs/Other Studies Reviewed:    The following studies were reviewed today: I discussed my findings with the patient in extensive length   Recent Labs: 06/29/2019: Magnesium 2.1 03/12/2020: ALT 10; BUN 28; Creatinine, Ser 1.10; Hemoglobin 13.6; Platelets 196;  Potassium 5.1; Sodium 138; TSH 2.883  Recent Lipid Panel    Component Value Date/Time   CHOL 197 03/13/2020 0500   TRIG 97 03/13/2020 0500   HDL 51 03/13/2020 0500   CHOLHDL 3.9 03/13/2020 0500   VLDL 19 03/13/2020 0500   LDLCALC 127 (H) 03/13/2020 0500    Physical Exam:    VS:  BP 122/68   Pulse 73   Ht 5\' 7"  (1.702 m)   Wt 155 lb 1.9 oz (70.4 kg)   SpO2 99%   BMI 24.30 kg/m     Wt Readings from Last 3 Encounters:  05/15/20 155 lb 1.9 oz (70.4 kg)  04/19/20 155 lb (70.3 kg)  04/12/20 154 lb (69.9 kg)     GEN: Patient is in no acute distress HEENT: Normal NECK: No JVD; No carotid bruits LYMPHATICS: No lymphadenopathy CARDIAC: Hear sounds regular, 2/6 systolic murmur at the apex. RESPIRATORY:  Clear to auscultation without rales, wheezing or rhonchi  ABDOMEN: Soft, non-tender, non-distended MUSCULOSKELETAL:  No edema; No deformity  SKIN: Warm and dry NEUROLOGIC:  Alert and oriented x 3 PSYCHIATRIC:  Normal affect   Signed, Jenean Lindau, MD  05/15/2020 8:34 AM    [place]

## 2020-05-15 NOTE — Patient Instructions (Signed)
Medication Instructions:  No medication changes. *If you need a refill on your cardiac medications before your next appointment, please call your pharmacy*   Lab Work: Your physician recommends that you have labs done in the office today. Your test included  basic metabolic panel, TSH, liver function and lipids.  If you have labs (blood work) drawn today and your tests are completely normal, you will receive your results only by: MyChart Message (if you have MyChart) OR A paper copy in the mail If you have any lab test that is abnormal or we need to change your treatment, we will call you to review the results.   Testing/Procedures: None ordered   Follow-Up: At [facility], you and your health needs are our priority.  As part of our continuing mission to provide you with exceptional heart care, we have created designated Provider Care Teams.  These Care Teams include your primary Cardiologist (physician) and Advanced Practice Providers (APPs -  Physician Assistants and Nurse Practitioners) who all work together to provide you with the care you need, when you need it.  We recommend signing up for the patient portal called "MyChart".  Sign up information is provided on this After Visit Summary.  MyChart is used to connect with patients for Virtual Visits (Telemedicine).  Patients are able to view lab/test results, encounter notes, upcoming appointments, etc.  Non-urgent messages can be sent to your provider as well.   To learn more about what you can do with MyChart, go to [email or web address].    Your next appointment:   6 month(s)  The format for your next appointment:   In Person  Provider:   Rajan Revankar, MD   Other Instructions NA  

## 2020-05-16 LAB — BASIC METABOLIC PANEL
BUN/Creatinine Ratio: 16 (ref 10–24)
BUN: 16 mg/dL (ref 8–27)
CO2: 21 mmol/L (ref 20–29)
Calcium: 9.4 mg/dL (ref 8.6–10.2)
Chloride: 105 mmol/L (ref 96–106)
Creatinine, Ser: 1.03 mg/dL (ref 0.76–1.27)
GFR calc Af Amer: 75 mL/min/{1.73_m2} (ref >59)
GFR calc non Af Amer: 65 mL/min/{1.73_m2} (ref >59)
Glucose: 102 mg/dL — ABNORMAL HIGH (ref 65–99)
Potassium: 4.3 mmol/L (ref 3.5–5.2)
Sodium: 140 mmol/L (ref 134–144)

## 2020-05-16 LAB — HEPATIC FUNCTION PANEL
ALT: 14 IU/L (ref 0–44)
AST: 12 IU/L (ref 0–40)
Albumin: 4.1 g/dL (ref 3.6–4.6)
Alkaline Phosphatase: 68 IU/L (ref 48–121)
Bilirubin Total: 0.5 mg/dL (ref 0.0–1.2)
Bilirubin, Direct: 0.16 mg/dL (ref 0.00–0.40)
Total Protein: 6.3 g/dL (ref 6.0–8.5)

## 2020-05-16 LAB — LIPID PANEL
Chol/HDL Ratio: 2.6 ratio (ref 0.0–5.0)
Cholesterol, Total: 150 mg/dL (ref 100–199)
HDL: 58 mg/dL (ref >39)
LDL Chol Calc (NIH): 78 mg/dL (ref 0–99)
Triglycerides: 73 mg/dL (ref 0–149)
VLDL Cholesterol Cal: 14 mg/dL (ref 5–40)

## 2020-05-16 LAB — TSH: TSH: 3.06 u[IU]/mL (ref 0.450–4.500)

## 2020-05-19 ENCOUNTER — Other Ambulatory Visit: Payer: Self-pay | Admitting: *Deleted

## 2020-05-19 NOTE — Patient Outreach (Signed)
[facility]) Care Management  05/19/2020  Oscar [name] Nov 16, 1930 [id number]   EMMI Stroke Red Flag Alert 30 days  Follow up . Patient reassigned to Care Coordinator on 05/11/20   EMMI stroke red alert  Day: #13 Date: 03/30/20 Reason : [name] to Follow up appointment ? NO Scheduled follow up appointment ? No   Subjective: Outreach call to patient , explained reason for the call and previous care manager outreach after EMMI automated call response. EMMI stroke red flag alert addressed on initial call on 7/27.  Patient states that he is doing just fine, he states that he has recovered  pretty good for his age . He reports weakness on right side after stroke unable to hold a spoon in hand but reports strength is back to normal, completed home health occupational therapy.  He reports being independent with daily care, does not require cane/walker for mobility,no falls  he states he continues to drive and has support if needed.  He discussed getting tired more easily now and that is frustrating, takes him longer to get some things done, reinforced taking rest breaks and spacing out activity. He denies any new stroke symptoms when reviewed, educated on action plan.  Patient discussed recent medical appointments since discharge with PCP, neurologist and cardiologist. He reports taking medications daily as prescribed. He denies need for any additional education material , Financial risk analyst.    Assessment Patient managing self health stroke follow up, stroke sign/symptoms recognition and action plan.  No ongoing care management needs identified.   Plan Will close case to [facility] care management .    Joylene Draft, RN, BSN  [place] Management Coordinator  [phone]- Mobile [phone]- Toll Free Main Office

## 2020-05-22 ENCOUNTER — Ambulatory Visit: Payer: Self-pay | Admitting: *Deleted

## 2020-06-09 ENCOUNTER — Telehealth: Payer: Self-pay | Admitting: Neurology

## 2020-06-09 MED ORDER — GABAPENTIN 100 MG PO CAPS: 100.0000 mg | ORAL_CAPSULE | Freq: Three times a day (TID) | ORAL | 1 refills | Status: DC | PRN

## 2020-06-09 NOTE — Telephone Encounter (Signed)
Saw patient in research, suffered stroke in June 2021.  Performed NIH, mRankin scale, see research documentation  He complains of frequent lower extremities muscle cramps, add on gabapentin 100mg  up to tid prn  Arrange follow up visit for worsening symptoms

## 2020-06-11 ENCOUNTER — Other Ambulatory Visit: Payer: Self-pay | Admitting: Cardiology

## 2020-08-03 ENCOUNTER — Ambulatory Visit (INDEPENDENT_AMBULATORY_CARE_PROVIDER_SITE_OTHER): Payer: Medicare Other | Admitting: Cardiovascular Disease

## 2020-08-03 ENCOUNTER — Encounter: Payer: Self-pay | Admitting: Cardiovascular Disease

## 2020-08-03 ENCOUNTER — Other Ambulatory Visit: Payer: Self-pay

## 2020-08-03 VITALS — BP 122/80 | HR 66 | Ht 67.0 in | Wt 155.4 lb

## 2020-08-03 DIAGNOSIS — I2510 Atherosclerotic heart disease of native coronary artery without angina pectoris: Secondary | ICD-10-CM

## 2020-08-03 NOTE — Patient Instructions (Signed)
Medication Instructions: STOP the Metoprolol  * If you need a refill on your cardiac medications before your next appointment, please call your pharmacy.   Testing/Procedures: Your physician has recommended that you have a loop recorder implant. Please follow the instructions below, located under the special instructions section.  Follow-Up: Your physician recommends that you schedule a wound check appointment 10-14 days, after your procedure on 08/08/20, with the device clinic.  Follow-Up: At [facility], you and your health needs are our priority.  As part of our continuing mission to provide you with exceptional heart care, we have created designated Provider Care Teams.  These Care Teams include your primary Cardiologist (physician) and Advanced Practice Providers (APPs -  Physician Assistants and Nurse Practitioners) who all work together to provide you with the care you need, when you need it.   Special Instructions:   [facility] [address], [address]  [place], [place] [address]  Phone: [phone] Fax: [phone]   You are scheduled for a Loop Recorder Implant on 08/08/20  at 11:00 am with Dr. Sallyanne Kuster. This will take place at [address], [address].    Please arrive at your appointment 15-20 minutes early.    You do not need to be fasting.    The procedure is performed with local anesthesia. You will not receive sedatives nor will an IV be placed.    Wash your chest and neck with the surgical soap the evening before and the morning of your procedure. Please following the washing instructions provided.    As with all surgical implants, there is a small risk of infection. If an infection occurs, the device will be removed. To help reduce the risk, please use the surgical scrub provided. Additional antiseptic precautions will be taken at the time of the procedure.    Please bring your insurance cards.   *Please note  that scheduled loop recorder implants may need to be rescheduled if Dr. Sallyanne Kuster has a procedure urgently added on at the hospital   Preparing for the Procedure  Before the procedure, you can play an important role. Because skin is not sterile, your skin needs to be as free of germs as possible. You can reduce the number of germs on your skin by washing with CHG (chlorhexidine gluconate) Soap before the procedure. CHG is an antiseptic cleaner which kills germs and bonds with the skin to continue killing germs even after washing.  Please do not use if you have an allergy to CHG or antibacterial soaps. If your skin becomes reddened/irritated, STOP using the CHG.  DO NOT SHAVE (including legs and underarms) for at least 48 hours prior to first CHG shower. It is OK to shave your face.  Please follow these instructions carefully: 1. Shower the night before the procedure and the morning of with CHG Soap. 2. If you chose to wash your hair, wash your hair first as usual with your normal shampoo/conditioner. 3. After you shampoo/condition, rinse you hair and body thoroughly to remove shampoo/conditioner. 4. Use CHG as you would any other liquid soap. You can apply CHG directly to the skin and wash gently with a loofah or a clean washcloth. 5. Apply the CHG Soap to your body ONLY FROM THE NECK DOWN. Do not use on open wounds or open sores. Avoid contact with your eyes, ears, mouth, and genitals (private parts).  6. Wash thoroughly, paying special attention to the area where your surgery will be performed.  7. Thoroughly rinse your body with warm water from the neck down. 8. DO NOT shower/wash with your normal soap after using and rinsing off the CHG Soap. 9. Pat yourself dry with a clean towel. 10. Wear clean pajamas to bed. 11. Place clean sheets on your bed the night of your first shower and do not sleep with pets..  Day of Surgery: Shower with the CHG Soap following the instructions listed above. DO  NOT apply deodorants or lotions.

## 2020-08-03 NOTE — Progress Notes (Signed)
Patient ID: Oscar [name], male   DOB: November 06, 1930, 86 y.o.   MRN: [id number]    Cardiology Office Note    Date:  08/03/2020   ID:  Oscar [name], DOB 1931-05-07, MRN [id number]  PCP:  Berkley Harvey, NP  Cardiologist:   Sanda Klein, MD   No chief complaint on file.   History of Present Illness:  Oscar [name] is a 86 y.o. male returns for complaints of poor balance, tendency to fall, irregular heartbeat and a sensation of "anxiety" in his chest, possible near syncope.  Several years ago in 2015 he had a loop recorder implanted for a single episode of syncope.  During 3 years of monitoring no meaningful arrhythmia was found.  The device reached end of service in 2018.  For many years he has had underlying conduction system disease with a very long PR interval and left bundle branch block.  Earlier in 2015 he actually had a right bundle branch block.  Due to his recent complaints of unsteady gait and dizziness he wore an event monitor in May-June.  A single 3.2-second pause was recorded at 8:30 in the morning (usually he wakes up around 6 AM) as far as he recalls that event was asymptomatic.  No further intervention was recommended.  On 03/12/2020 he had a stroke and was hospitalized at [facility].  His echocardiogram showed normal left ventricular systolic function and no serious valvular abnormalities.  He had moderate LVH with diastolic dysfunction and very mild dilation of the ascending aorta, aortic valve sclerosis.  It was felt that this stroke was due to small vessel disease and he was prescribed dual antiplatelet for 3 weeks followed by clopidogrel monotherapy which he is still taking.  He most recently had follow-up with Frann Rider, NP in the neurology clinic on April 19, 2020.  He does not have any other cardiovascular complaints and specifically denies either angina or shortness of breath either at rest or with activity.  He has not had claudication, leg edema or true full-blown syncope.   He has noticed that his blood pressure monitor often shows an irregular rhythm and that this worsened when he was prescribed some type of thyroid therapy, which she decided to stop.  His blood pressure is consistently normal in the 100/60-130/70 range.  His typical heart rate is in the low 60s.  He had virtually normal coronary angiography with minimal plaque in 2020.  Intracardiac filling pressures were normal.  The cardiac catheterization was prompted by a large perfusion defect which is actually consistent with LBBB related artifact.  He takes a statin for hypercholesterolemia and his most recent LDL cholesterol was 78.  He does not have diabetes mellitus and has a borderline recent hemoglobin A1c of 5.7%.  He takes a very small dose of loop diuretic (20 mg 3 days a week) for lower extremity edema.  He recently had a cat bite to his left hand that caused a lot of bleeding and inflammation but is now healing.  Past Medical History:  Diagnosis Date  . Bladder tumor   . Cervical stenosis of spine   . E. coli infection   . Essential hypertension 03/12/2020  . GERD (gastroesophageal reflux disease)   . History of appendectomy   . History of concussion    X2   NO RESIDUALS  . History of peptic ulcer 1970'S  . Hyperlipidemia   . Irregular heart beat   . LBBB (left bundle branch block)   . Status  post placement of implantable loop recorder 11/08/2014    Past Surgical History:  Procedure Laterality Date  . ANTERIOR CERVICAL DECOMP/DISCECTOMY FUSION N/A 01/27/2018   Procedure: Anterior Cervical Discectomy Fusion - Cervical Four- Cervical Five;  Surgeon: Earnie Larsson, MD;  Location: [facility];  Service: Neurosurgery;  Laterality: N/A;  Anterior Cervical Discectomy Fusion - Cervical Four- Cervical Five  . APPENDECTOMY  1970  . CARDIOVASCULAR STRESS TEST  03/22/09  . CYSTOSCOPY WITH BIOPSY N/A 02/10/2013   Procedure: CYSTOSCOPY WITH COLD CUP BIOPSY/FULGERATION;  Surgeon: Bernestine Amass, MD;  Location:  [facility];  Service: Urology;  Laterality: N/A;  ALSO FULGERATION   . INGUINAL HERNIA REPAIR Right 07-14-2002  . LEFT HEART CATH AND CORONARY ANGIOGRAPHY N/A 07/13/2019   Procedure: LEFT HEART CATH AND CORONARY ANGIOGRAPHY;  Surgeon: Troy Sine, MD;  Location: [facility];  Service: Cardiovascular;  Laterality: N/A;  . LOOP RECORDER IMPLANT N/A 05/24/2014   Procedure: LOOP RECORDER IMPLANT;  Surgeon: Sanda Klein, MD;  Location: [facility];  Service: Cardiovascular;  Laterality: N/A;  . NECK SURGERY    . TONSILLECTOMY    . TRANSURETHRAL RESECTION OF BLADDER TUMOR  03-10-2008    Current Medications: Outpatient Medications Prior to Visit  Medication Sig Dispense Refill  . amoxicillin-clavulanate (AUGMENTIN) 875-125 MG tablet Take 1 tablet by mouth 2 (two) times daily.    Marland Kitchen atorvastatin (LIPITOR) 40 MG tablet Take 1 tablet (40 mg total) by mouth daily. 30 tablet 1  . clopidogrel (PLAVIX) 75 MG tablet Take 1 tablet (75 mg total) by mouth daily. 90 tablet 3  . furosemide (LASIX) 20 MG tablet Take 20 mg (1 tablet) on Tuesday, Thursday and Saturdays. 30 tablet 3  . gabapentin (NEURONTIN) 100 MG capsule Take 1 capsule (100 mg total) by mouth 3 (three) times daily as needed. 90 capsule 1  . Glucosamine-Chondroit-Vit C-Mn (GLUCOSAMINE 1500 COMPLEX PO) Take 1 capsule by mouth daily.    . Olopatadine HCl 0.2 % SOLN Place 1 drop into both eyes as needed (Dry eye).     Marland Kitchen omeprazole (PRILOSEC) 20 MG capsule Take 20 mg by mouth every morning.    . Potassium 99 MG TABS Take 198 mg by mouth daily.     . metoprolol succinate (TOPROL-XL) 25 MG 24 hr tablet Take 1/2 (one-half) tablet by mouth once daily 45 tablet 2  . MAGNESIUM PO Take 250 mg by mouth daily.  (Patient not taking: Reported on 08/03/2020)     No facility-administered medications prior to visit.     Allergies:   Patient has no known allergies.   Social History   Socioeconomic History  . Marital status:  Married    Spouse name: Not on file  . Number of children: Not on file  . Years of education: Not on file  . Highest education level: Not on file  Occupational History  . Occupation: retired  Tobacco Use  . Smoking status: Former Smoker    Packs/day: 0.50    Years: 30.00    Pack years: 15.00    Types: Cigarettes    Quit date: 02/06/1979    Years since quitting: 41.5  . Smokeless tobacco: Never Used  Vaping Use  . Vaping Use: Never used  Substance and Sexual Activity  . Alcohol use: Yes    Alcohol/week: 0.0 standard drinks    Comment: 1 glass of beer once or twice a month  . Drug use: No  . Sexual activity: Not on file  Other Topics Concern  . Not on file  Social History Narrative  . Not on file   Social Determinants of Health   Financial Resource Strain:   . Difficulty of Paying Living Expenses: Not on file  Food Insecurity:   . Worried About Charity fundraiser in the Last Year: Not on file  . Ran Out of Food in the Last Year: Not on file  Transportation Needs: No Transportation Needs  . Lack of Transportation (Medical): No  . Lack of Transportation (Non-Medical): No  Physical Activity:   . Days of Exercise per Week: Not on file  . Minutes of Exercise per Session: Not on file  Stress:   . Feeling of Stress : Not on file  Social Connections:   . Frequency of Communication with Friends and Family: Not on file  . Frequency of Social Gatherings with Friends and Family: Not on file  . Attends Religious Services: Not on file  . Active Member of Clubs or Organizations: Not on file  . Attends Archivist Meetings: Not on file  . Marital Status: Not on file     Family History:  The patient's family history includes Diabetes in his brother; Heart failure in his brother and mother; Stroke in his father.   ROS:   Please see the history of present illness.    ROS All other systems reviewed and are negative.   PHYSICAL EXAM:   VS:  BP 122/80 (BP Location: Left  Arm, Patient Position: Sitting)   Pulse 66   Ht 5\' 7"  (1.702 m)   Wt 155 lb 6.4 oz (70.5 kg)   SpO2 97%   BMI 24.34 kg/m     General: Alert, oriented x3, no distress, lean and fit Head: no evidence of trauma, PERRL, EOMI, no exophtalmos or lid lag, no myxedema, no xanthelasma; normal ears, nose and oropharynx Neck: normal jugular venous pulsations and no hepatojugular reflux; brisk carotid pulses without delay and no carotid bruits Chest: clear to auscultation, no signs of consolidation by percussion or palpation, normal fremitus, symmetrical and full respiratory excursions. Healthy loop recorder site Cardiovascular: normal position and quality of the apical impulse, regular rhythm, normal first and second heart sounds, no murmurs, rubs or gallops Abdomen: no tenderness or distention, no masses by palpation, no abnormal pulsatility or arterial bruits, normal bowel sounds, no hepatosplenomegaly Extremities: no clubbing, cyanosis or edema; 2+ radial, ulnar and brachial pulses bilaterally; 2+ right femoral, posterior tibial and dorsalis pedis pulses; 2+ left femoral, posterior tibial and dorsalis pedis pulses; no subclavian or femoral bruits Neurological: grossly nonfocal Psych: Normal mood and affect   Wt Readings from Last 3 Encounters:  08/03/20 155 lb 6.4 oz (70.5 kg)  05/15/20 155 lb 1.9 oz (70.4 kg)  04/19/20 155 lb (70.3 kg)      Studies/Labs Reviewed:  Cardiac cath 2020   Prox RCA lesion is 10% stenosed.  Prox Cx to Mid Cx lesion is 20% stenosed.  Mid Cx lesion is 30% stenosed.  1st Diag lesion is 20% stenosed.  The left ventricular systolic function is normal.  LV end diastolic pressure is normal.  The left ventricular ejection fraction is 50-55% by visual estimate.  There is trivial (1+) mitral regurgitation.   Mild nonobstructive CAD with 20% narrowing in a proximal diagonal branch of the LAD prior to its bifurcation and a otherwise normal LAD system; smooth  proximal 20% left circumflex stenosis followed by 30% smooth eccentric mid AV groove stenosis; and mild  10% smooth narrowing in the mid dominant RCA.  Low normal global LV function with EF estimate at 50 to 55% in this patient with chronic left bundle branch block.  There appears to be only trivial mitral regurgitation. LVEDP 13 mm Hg.  RECOMMENDATION: Medical therapy for mild nonobstructive CAD.  Echo 03/12/2020 1. Left ventricular ejection fraction, by estimation, is 55 to 60%. The  left ventricle has normal function. The left ventricle has no regional  wall motion abnormalities. There is moderate concentric left ventricular  hypertrophy. Left ventricular  diastolic parameters are consistent with Grade I diastolic dysfunction  (impaired relaxation). Elevated left atrial pressure.  2. Right ventricular systolic function is normal. The right ventricular  size is normal. There is normal pulmonary artery systolic pressure.  3. The mitral valve is normal in structure. Mild mitral valve  regurgitation. No evidence of mitral stenosis.  4. The aortic valve is normal in structure. Aortic valve regurgitation is  not visualized. Mild to moderate aortic valve sclerosis/calcification is  present, without any evidence of aortic stenosis.  5. Aortic dilatation noted. There is mild dilatation of the ascending  aorta measuring 41 mm.  6. The inferior vena cava is normal in size with greater than 50%  respiratory variability, suggesting right atrial pressure of 3 mmHg.   EKG:  EKG is ordered today.  It shows sinus rhythm with first-degree AV block (PR 210 ms), left bundle branch block (QRS 130 ms), QTC 440 ms  ASSESSMENT:    1. Coronary artery disease involving native coronary artery of native heart without angina pectoris      PLAN:  In order of problems listed above:  1.  Near syncope: Laurey Arrow is having a lot of difficulty articulating very exact complaints, but I think his constellation  of unsteadiness, poor balance, feelings of risk of falling, anxiety in his chest and irregular heartbeat may be his way of describing intermittent problems with pauses/bradycardia/AV block.  During a 2-week monitor earlier this year he did have an asymptomatic 3.2-second pause.  He has an extensive and lengthy history of conduction system disease and has had right bundle branch block in 2015, subsequent left bundle branch block.  He is therefore at very high risk for developing third-degree AV block.  I told him to stop taking metoprolol.  I have recommended implanting another loop recorder.  We can remove the old one at the same time, if he prefers. This procedure has been fully reviewed with the patient and written informed consent has been obtained.  2.  Recent stroke: This was attributed to probable small vessel disease and we have never detected atrial fibrillation.  No loop recorder will also help confirm that antiplatelet therapy is appropriate, rather than anticoagulation.  3. HLP: On statin, extremely little progression of CAD in 20 years between his heart catheterizations.  I think a target LDL less than 100 is satisfactory and there is no reason to change his medication.    Medication Adjustments/Labs and Tests Ordered: Current medicines are reviewed at length with the patient today.  Concerns regarding medicines are outlined above.  Medication changes, Labs and Tests ordered today are listed in the Patient Instructions below. Patient Instructions  Medication Instructions: STOP the Metoprolol  * If you need a refill on your cardiac medications before your next appointment, please call your pharmacy.   Testing/Procedures: Your physician has recommended that you have a loop recorder implant. Please follow the instructions below, located under the special instructions section.  Follow-Up:  Your physician recommends that you schedule a wound check appointment 10-14 days, after your  procedure on 08/08/20, with the device clinic.  Follow-Up: At [facility], you and your health needs are our priority.  As part of our continuing mission to provide you with exceptional heart care, we have created designated Provider Care Teams.  These Care Teams include your primary Cardiologist (physician) and Advanced Practice Providers (APPs -  Physician Assistants and Nurse Practitioners) who all work together to provide you with the care you need, when you need it.   Special Instructions:   [facility], [address]  [place], [place] [address]  Phone: [phone] Fax: [phone]   You are scheduled for a Loop Recorder Implant on 08/08/20  at 11:00 am with Dr. Sallyanne Kuster. This will take place at [place], [address].    Please arrive at your appointment 15-20 minutes early.    You do not need to be fasting.    The procedure is performed with local anesthesia. You will not receive sedatives nor will an IV be placed.    Wash your chest and neck with the surgical soap the evening before and the morning of your procedure. Please following the washing instructions provided.    As with all surgical implants, there is a small risk of infection. If an infection occurs, the device will be removed. To help reduce the risk, please use the surgical scrub provided. Additional antiseptic precautions will be taken at the time of the procedure.    Please bring your insurance cards.   *Please note that scheduled loop recorder implants may need to be rescheduled if Dr. Sallyanne Kuster has a procedure urgently added on at the hospital   Preparing for the Procedure  Before the procedure, you can play an important role. Because skin is not sterile, your skin needs to be as free of germs as possible. You can reduce the number of germs on your skin by washing with CHG (chlorhexidine gluconate) Soap before the procedure. CHG is an antiseptic  cleaner which kills germs and bonds with the skin to continue killing germs even after washing.  Please do not use if you have an allergy to CHG or antibacterial soaps. If your skin becomes reddened/irritated, STOP using the CHG.  DO NOT SHAVE (including legs and underarms) for at least 48 hours prior to first CHG shower. It is OK to shave your face.  Please follow these instructions carefully: 1. Shower the night before the procedure and the morning of with CHG Soap. 2. If you chose to wash your hair, wash your hair first as usual with your normal shampoo/conditioner. 3. After you shampoo/condition, rinse you hair and body thoroughly to remove shampoo/conditioner. 4. Use CHG as you would any other liquid soap. You can apply CHG directly to the skin and wash gently with a loofah or a clean washcloth. 5. Apply the CHG Soap to your body ONLY FROM THE NECK DOWN. Do not use on open wounds or open sores. Avoid contact with your eyes, ears, mouth, and genitals (private parts).  6. Wash thoroughly, paying special attention to the area where your surgery will be performed. 7. Thoroughly rinse your body with warm water from the neck down. 8. DO NOT shower/wash with your normal soap after using and rinsing off the CHG Soap. 9. Pat yourself dry with a clean towel. 10. Wear clean pajamas to bed. 11. Place clean sheets on your bed the  night of your first shower and do not sleep with pets..  Day of Surgery: Shower with the CHG Soap following the instructions listed above. DO NOT apply deodorants or lotions.       Signed, Sanda Klein, MD  08/03/2020 5:43 PM    [facility] [address], [place], [place]  [address] Phone: [phone]; Fax: [phone]

## 2020-08-08 ENCOUNTER — Ambulatory Visit (INDEPENDENT_AMBULATORY_CARE_PROVIDER_SITE_OTHER): Payer: Medicare Other | Admitting: Cardiovascular Disease

## 2020-08-08 ENCOUNTER — Other Ambulatory Visit: Payer: Self-pay

## 2020-08-08 DIAGNOSIS — R55 Syncope and collapse: Secondary | ICD-10-CM | POA: Diagnosis not present

## 2020-08-08 DIAGNOSIS — Z4509 Encounter for adjustment and management of other cardiac device: Secondary | ICD-10-CM | POA: Diagnosis not present

## 2020-08-08 DIAGNOSIS — I639 Cerebral infarction, unspecified: Secondary | ICD-10-CM

## 2020-08-08 DIAGNOSIS — Z95818 Presence of other cardiac implants and grafts: Secondary | ICD-10-CM

## 2020-08-08 MED ORDER — LIDOCAINE-EPINEPHRINE 1 %-1:100000 IJ SOLN: 10.0000 mL | Freq: Once | INTRAMUSCULAR | Status: DC

## 2020-08-08 NOTE — Progress Notes (Signed)
LOOP RECORDER IMPLANT   Procedure report  Procedure performed:  Old loop recorder explantation New Loop recorder implantation   Reason for procedure:  1. Recurrent syncope/near-syncope 2. Cryptogenic stroke 3. Old loop recorder at end of service Procedure performed by:  Sanda Klein, MD  Complications:  None  Estimated blood loss:  <5 mL  Medications administered during procedure:  Lidocaine 1% with 1/100,000 epinephrine 10 mL locally Device details:  New implanted device: Medtronic Reveal Linq2, model number [id number], serial number [id number] Procedure details:  After the risks and benefits of the procedure were discussed the patient provided informed consent. The patient was prepped and draped in usual sterile fashion. Local anesthesia was administered to an area 2 cm to the left of the sternum in the 4th intercostal space. A cutaneous incision was made using a scalpel. The old loop recorder was located and extracted with a hemostat. The new loop recorder was inserted using the specialized tool. Local pressure was held to ensure hemostasis.  The incision was closed with SteriStrips and a sterile dressing was applied.  R waves 0.30 mV  Sanda Klein, MD, [facility] [phone] office [phone] pager 08/08/2020 11:55 AM

## 2020-08-08 NOTE — Patient Instructions (Addendum)
Discharge Instructions for  Loop Recorder Implant    Follow up: Keep your wound check appointment on 08/22/20 at 10:45 (in office).  If you have any questions or concerns, please call the office at [phone].  ACTIVITY No restrictions. DO wear your seatbelt, even if it crosses over the site.   WOUND CARE  Keep the wound area clean and dry.  Remove the dressing the day after (usually 24 hours after the procedure).  DO NOT SUBMERGE UNDER WATER UNTIL FULLY HEALED (no tub baths, hot tubs, swimming pools, etc.).   You  may shower or take a sponge bath after the dressing is removed. DO NOT SOAK the area and do not allow the shower to directly spray on the site.  If you have tape/steri-strips on your wound, these will fall off; do not pull them off prematurely.    No bandage is needed on the site.  DO  NOT apply any creams, oils, or ointments to the wound area.  If you notice any drainage or discharge from the wound, any swelling, excessive redness or bruising at the site, or if you develop a fever > 101? F, call the office at once at [phone].

## 2020-08-22 ENCOUNTER — Ambulatory Visit (INDEPENDENT_AMBULATORY_CARE_PROVIDER_SITE_OTHER): Payer: Medicare Other | Admitting: Cardiovascular Disease

## 2020-08-22 ENCOUNTER — Other Ambulatory Visit: Payer: Self-pay

## 2020-08-22 DIAGNOSIS — Z95818 Presence of other cardiac implants and grafts: Secondary | ICD-10-CM

## 2020-08-22 NOTE — Patient Instructions (Signed)
Medication Instructions:  No changes *If you need a refill on your cardiac medications before your next appointment, please call your pharmacy*   Lab Work: None ordered If you have labs (blood work) drawn today and your tests are completely normal, you will receive your results only by: Marland Kitchen MyChart Message (if you have MyChart) OR . A paper copy in the mail If you have any lab test that is abnormal or we need to change your treatment, we will call you to review the results.   Testing/Procedures: None ordered   Follow-Up: At [facility], you and your health needs are our priority.  As part of our continuing mission to provide you with exceptional heart care, we have created designated Provider Care Teams.  These Care Teams include your primary Cardiologist (physician) and Advanced Practice Providers (APPs -  Physician Assistants and Nurse Practitioners) who all work together to provide you with the care you need, when you need it.  We recommend signing up for the patient portal called "MyChart".  Sign up information is provided on this After Visit Summary.  MyChart is used to connect with patients for Virtual Visits (Telemedicine).  Patients are able to view lab/test results, encounter notes, upcoming appointments, etc.  Non-urgent messages can be sent to your provider as well.   To learn more about what you can do with MyChart, go to [email or web address].    Your next appointment:   12 month(s)  The format for your next appointment:   In Person  Provider:   You may see Mihai Croitoru or one of the following Engineer, manufacturing Providers on your designated Care Team:    Almyra Deforest, PA-C  Fabian Sharp, PA-C or   Roby Lofts, [place]

## 2020-08-22 NOTE — Progress Notes (Signed)
Return office visit after loop recorder implantation for wound check.  The surgical site has healed completely.  The dressing and Steri-Strips have been removed and he has a healthy appearing scar without any redness, swelling, tenderness, drainage or erythema.  He has activated the loop recorder remote activator multiple times for feelings of uneasiness and jumping in his chest, but the device has not recorded any spontaneous episodes of tachycardia/bradycardia/pauses/atrial fibrillation. Review of the symptom driven recordings shows normal rhythm, may be an occasional PVC.  It is pretty clear that arrhythmia is not the cause of his sensation of anxiety and palpitations.  His loop recorder is also indicated for monitoring for atrial fibrillation with a history of cryptogenic stroke and monitoring for episodes of high-grade AV block in view of his conduction abnormalities.

## 2020-09-11 ENCOUNTER — Other Ambulatory Visit: Payer: Self-pay | Admitting: Neurology

## 2020-09-11 DIAGNOSIS — I639 Cerebral infarction, unspecified: Secondary | ICD-10-CM

## 2020-09-11 DIAGNOSIS — I2510 Atherosclerotic heart disease of native coronary artery without angina pectoris: Secondary | ICD-10-CM

## 2020-09-12 ENCOUNTER — Ambulatory Visit (INDEPENDENT_AMBULATORY_CARE_PROVIDER_SITE_OTHER): Payer: Medicare Other

## 2020-09-12 DIAGNOSIS — I639 Cerebral infarction, unspecified: Secondary | ICD-10-CM

## 2020-09-13 LAB — CUP PACEART REMOTE DEVICE CHECK
Date Time Interrogation Session: 20211221210411
Implantable Pulse Generator Implant Date: 20211116

## 2020-09-14 ENCOUNTER — Other Ambulatory Visit: Payer: Self-pay

## 2020-09-14 ENCOUNTER — Ambulatory Visit (HOSPITAL_COMMUNITY)
Admission: RE | Admit: 2020-09-14 | Discharge: 2020-09-14 | Disposition: A | Discharge status: Home / Self Care | Payer: Medicare Other | Source: Ambulatory Visit | Attending: Neurology | Admitting: Neurology

## 2020-09-14 DIAGNOSIS — I639 Cerebral infarction, unspecified: Secondary | ICD-10-CM | POA: Insufficient documentation

## 2020-09-26 NOTE — Progress Notes (Signed)
Carelink Summary Report / Loop Recorder 

## 2020-09-28 ENCOUNTER — Other Ambulatory Visit (HOSPITAL_COMMUNITY): Payer: Medicare Other

## 2020-09-28 ENCOUNTER — Other Ambulatory Visit (HOSPITAL_COMMUNITY)
Admission: RE | Admit: 2020-09-28 | Discharge: 2020-09-28 | Disposition: A | Discharge status: Home / Self Care | Payer: Medicare Other | Source: Ambulatory Visit | Attending: Cardiovascular Disease | Admitting: Cardiovascular Disease

## 2020-09-28 ENCOUNTER — Telehealth: Payer: Self-pay | Admitting: Student

## 2020-09-28 DIAGNOSIS — I442 Atrioventricular block, complete: Secondary | ICD-10-CM | POA: Diagnosis present

## 2020-09-28 DIAGNOSIS — Z01812 Encounter for preprocedural laboratory examination: Secondary | ICD-10-CM

## 2020-09-28 DIAGNOSIS — Z20822 Contact with and (suspected) exposure to covid-19: Secondary | ICD-10-CM | POA: Diagnosis not present

## 2020-09-28 HISTORY — DX: Atrioventricular block, complete: I44.2

## 2020-09-28 NOTE — Addendum Note (Signed)
Addended by: Johney Frame A on: 09/28/2020 11:01 AM   Modules accepted: Orders

## 2020-09-28 NOTE — Telephone Encounter (Signed)
Thank you, Oscar [name]. Definitely complete heart block. Needs a dual chamber pacemaker. [name], can we please set him up for the 1;30 slot on Monday?

## 2020-09-28 NOTE — Telephone Encounter (Signed)
Carelink alert for bradycardia < 30 bpm during waking hours.   Called patient who had symptoms of near syncope, lightheadedness, and malaise at that time.   Review of EGM shows intermittent CHB as below.   This morning pt is feeling his USOH.   I discussed personally with Dr. Royann Shivers who will reach out to the patient and plan on pacemaker at the beginning of next week.   Pt knows if he has further symptoms of near syncope, especially if he has frank syncope, to call 911.  He verbalizes understanding not to drive.   He has LBBB at baseline and is not on AV nodal blockade.   Casimiro Needle [address]" [place], PA-C  09/28/2020 9:52 AM

## 2020-09-28 NOTE — Telephone Encounter (Signed)
I called [name] and discussed the findings reported by his loop recorder.  He had symptomatic bradycardia due to complete heart block that cannot be attributed to any medications or reversible causes.  He has longstanding conduction abnormalities (LBBB).  He should undergo implantation of a dual-chamber permanent pacemaker. We discussed the pacemaker procedure in detail over the phone.  Specifically reviewed the risks of infection, myocardial perforation, pneumothorax, lead dislodgment and need for reoperation amongst others.  We will plan on removing the loop recorder at the time of pacemaker implantation. This procedure has been fully reviewed with the patient and informed consent has been obtained. This is the only serious arrhythmia that has been documented so far during months of monitoring with his loop recorder. In view of the current strain on our hospital in the setting of the COVID-19 pandemic, we decided that it is better not to admit [name] until pacemaker implantation can be performed.  However, should he have recurrent symptoms he should dial 911 and be brought to the emergency room.  He should not drive until his pacemaker implantation procedure.

## 2020-09-28 NOTE — Telephone Encounter (Signed)
Testing/Procedures: Your physician has recommended that you have a pacemaker inserted. A pacemaker is a small device that is placed under the skin of your chest or abdomen to help control abnormal heart rhythms. This device uses electrical pulses to prompt the heart to beat at a normal rate. Pacemakers are used to treat heart rhythms that are too slow. Wire (leads) are attached to the pacemaker that goes into the chambers of you heart. This is done in the hospital and usually requires and overnight stay. Please follow the instructions below, located under the special instructions section.  Follow-Up: Your physician recommends that you schedule a wound check appointment 10-14 days, after your procedure on 10/02/2020, with the device clinic.   Your physician recommends that you schedule a follow up appointment in 91 days, after your procedure on 10/02/2020, with Dr. Sallyanne Kuster.  Thank you for choosing [facility]!!      Any Other Special Instructions Will Be Listed Below (If Applicable).     Implantable Device Instructions  You are scheduled for: Permanent Transvenous Pacemaker implant on Monday, 10/02/2020  with Dr. Sallyanne Kuster.  1.   Please arrive at the [facility], Entrance "A"  at [facility] at  11:30 AM on the day of your procedure. (The address is [address])  2. Do not eat or drink after midnight the night before your procedure.  3.   Labs completed today in office  You will need to have the coronavirus test completed prior to your procedure. An appointment has been made at 12:25 pm on 09/28/20. This is a Drive Up Visit at [id number], [place], [place] [address]. Please tell them that you are there for procedure testing. Stay in your car and someone will be with you shortly. Please make sure to have all other labs completed before this test because you will need to stay quarantined until your procedure.  4.    Hold the following medications the morning of  your procedure: Lasix            5.  Plan for an overnight stay.  Bring your insurance cards and a list of you medications.  6.  Wash your chest and neck with surgical scrub the evening before and the morning of your procedure.  Rinse well. Please review the surgical scrub instruction sheet given to you. You may pick this up at the [facility] office or any drug store.  7. Your chest will need to be shaved prior to this procedure (if needed). We ask that you do this yourself at home 1 to 2 days before or if uncomfortable/unable to do yourself, then it will be performed by the hospital staff the day of.                                                                                                                * If you have ant questions after you get home, please call Lattie Haw, RN at [phone]  * Every attempt is made to prevent  procedures from being rescheduled.  Due to the nature of  Electrophysiology, rescheduling can happen.  The physician is always aware and directs the staff when this occurs.     Pacemaker Implantation, Adult Pacemaker implantation is a procedure to place a pacemaker inside your chest. A pacemaker is a small computer that sends electrical signals to the heart and helps your heart beat normally. A pacemaker also stores information about your heart rhythms. You may need pacemaker implantation if you:  Have a slow heartbeat (bradycardia).  Faint (syncope).  Have shortness of breath (dyspnea) due to heart problems.  The pacemaker attaches to your heart through a wire, called a lead. Sometimes just one lead is needed. Other times, there will be two leads. There are two types of pacemakers:  Transvenous pacemaker. This type is placed under the skin or muscle of your chest. The lead goes through a vein in the chest area to reach the inside of the heart.  Epicardial pacemaker. This type is placed under the skin or muscle of your chest or belly. The lead goes through your  chest to the outside of the heart.  Tell a health care provider about:  Any allergies you have.  All medicines you are taking, including vitamins, herbs, eye drops, creams, and over-the-counter medicines.  Any problems you or family members have had with anesthetic medicines.  Any blood or bone disorders you have.  Any surgeries you have had.  Any medical conditions you have.  Whether you are pregnant or may be pregnant. What are the risks? Generally, this is a safe procedure. However, problems may occur, including:  Infection.  Bleeding.  Failure of the pacemaker or the lead.  Collapse of a lung or bleeding into a lung.  Blood clot inside a blood vessel with a lead.  Damage to the heart.  Infection inside the heart (endocarditis).  Allergic reactions to medicines.  What happens before the procedure? Staying hydrated Follow instructions from your health care provider about hydration, which may include:  Up to 2 hours before the procedure - you may continue to drink clear liquids, such as water, clear fruit juice, black coffee, and plain tea.  Eating and drinking restrictions Follow instructions from your health care provider about eating and drinking, which may include:  8 hours before the procedure - stop eating heavy meals or foods such as meat, fried foods, or fatty foods.  6 hours before the procedure - stop eating light meals or foods, such as toast or cereal.  6 hours before the procedure - stop drinking milk or drinks that contain milk.  2 hours before the procedure - stop drinking clear liquids.  Medicines  Ask your health care provider about: ? Changing or stopping your regular medicines. This is especially important if you are taking diabetes medicines or blood thinners. ? Taking medicines such as aspirin and ibuprofen. These medicines can thin your blood. Do not take these medicines before your procedure if your health care provider instructs you  not to.  You may be given antibiotic medicine to help prevent infection. General instructions  You will have a heart evaluation. This may include an electrocardiogram (ECG), chest X-ray, and heart imaging (echocardiogram,  or echo) tests.  You will have blood tests.  Do not use any products that contain nicotine or tobacco, such as cigarettes and e-cigarettes. If you need help quitting, ask your health care provider.  Plan to have someone take you home from the hospital or clinic.  If you will be going home right after the procedure, plan to have someone with you for 24 hours.  Ask your health care provider how your surgical site will be marked or identified. What happens during the procedure?  To reduce your risk of infection: ? Your health care team will wash or sanitize their hands. ? Your skin will be washed with soap. ? Hair may be removed from the surgical area.  An IV tube will be inserted into one of your veins.  You will be given one or more of the following: ? A medicine to help you relax (sedative). ? A medicine to numb the area (local anesthetic). ? A medicine to make you fall asleep (general anesthetic).  If you are getting a transvenous pacemaker: ? An incision will be made in your upper chest. ? A pocket will be made for the pacemaker. It may be placed under the skin or between layers of muscle. ? The lead will be inserted into a blood vessel that returns to the heart. ? While X-rays are taken by an imaging machine (fluoroscopy), the lead will be advanced through the vein to the inside of your heart. ? The other end of the lead will be tunneled under the skin and attached to the pacemaker.  If you are getting an epicardial pacemaker: ? An incision will be made near your ribs or breastbone (sternum) for the lead. ? The lead will be attached to the outside of your heart. ? Another incision will be made in your chest or upper belly to create a pocket for the  pacemaker. ? The free end of the lead will be tunneled under the skin and attached to the pacemaker.  The transvenous or epicardial pacemaker will be tested. Imaging studies may be done to check the lead position.  The incisions will be closed with stitches (sutures), adhesive strips, or skin glue.  Bandages (dressing) will be placed over the incisions. The procedure may vary among health care providers and hospitals. What happens after the procedure?  Your blood pressure, heart rate, breathing rate, and blood oxygen level will be monitored until the medicines you were given have worn off.  You will be given antibiotics and pain medicine.  ECG and chest x-rays will be done.  You will wear a continuous type of ECG (Holter monitor) to check your heart rhythm.  Your health care provider will program the pacemaker.  Do not drive for 24 hours if you received a sedative. This information is not intended to replace advice given to you by your health care provider. Make sure you discuss any questions you have with your health care provider. Document Released: 08/30/2002 Document Revised: 03/29/2016 Document Reviewed: 02/21/2016 Elsevier Interactive Patient Education  2018 [employer].     Pacemaker Implantation, Adult, Care After This sheet gives you information about how to care for yourself after your procedure. Your health care provider may also give you more specific instructions. If you have problems or questions, contact your health care provider. What can I expect after the procedure? After the procedure, it is common to have:  Mild pain.  Slight bruising.  Some swelling over the incision.  A slight bump over the skin where the device was placed. Sometimes, it is possible to feel the device under the skin. This is normal.  Follow these instructions at home: Medicines  Take over-the-counter and prescription medicines only as told by your health care provider.  If you  were prescribed an  antibiotic medicine, take it as told by your health care provider. Do not stop taking the antibiotic even if you start to feel better. Wound care  Do not remove the bandage on your chest until directed to do so by your health care provider.  After your bandage is removed, you may see pieces of tape called skin adhesive strips over the area where the cut was made (incision site). Let them fall off on their own.  Check the incision site every day to make sure it is not infected, bleeding, or starting to pull apart.  Do not use lotions or ointments near the incision site unless directed to do so.  Keep the incision area clean and dry for 2-3 days after the procedure or as directed by your health care provider. It takes several weeks for the incision site to completely heal.  Do not take baths, swim, or use a hot tub for 7-10 days or as otherwise directed by your health care provider. Activity  Do not drive or use heavy machinery while taking prescription pain medicine.  Do not drive for 24 hours if you were given a medicine to help you relax (sedative).  Check with your health care provider before you start to drive or play sports.  Avoid sudden jerking, pulling, or chopping movements that pull your upper arm far away from your body. Avoid these movements for at least 6 weeks or as long as told by your health care provider.  Do not lift your upper arm above your shoulders for at least 6 weeks or as long as told by your health care provider. This means no tennis, golf, or swimming.  You may go back to work when your health care provider says it is okay. Pacemaker care  You may be shown how to transfer data from your pacemaker through the phone to your health care provider.  Always let all health care providers know about your pacemaker before you have any medical procedures or tests.  Wear a medical ID bracelet or necklace stating that you have a pacemaker. Carry a  pacemaker ID card with you at all times.  Your pacemaker battery will last for 5-15 years. Routine checks by your health care provider will let the health care provider know when the battery is starting to run down. The pacemaker will need to be replaced when the battery starts to run down.  Do not use amateur Chief of Staff. Other electrical devices are safe to use, including power tools, lawn mowers, and speakers. If you are unsure of whether something is safe to use, ask your health care provider.  When using your cell phone, hold it to the ear opposite the pacemaker. Do not leave your cell phone in a pocket over the pacemaker.  Avoid places or objects that have a strong electric or magnetic field, including: ? Airport Herbalist. When at the airport, let officials know that you have a pacemaker. ? Power plants. ? Large electrical generators. ? Radiofrequency transmission towers, such as cell phone and radio towers. General instructions  Weigh yourself every day. If you suddenly gain weight, fluid may be building up in your body.  Keep all follow-up visits as told by your health care provider. This is important. Contact a health care provider if:  You gain weight suddenly.  Your legs or feet swell.  It feels like your heart is fluttering or skipping beats (heart palpitations).  You have chills or a fever.  You have more redness, swelling, or pain around your incisions.  You have more fluid or blood coming from your incisions.  Your incisions feel warm to the touch.  You have pus or a bad smell coming from your incisions. Get help right away if:  You have chest pain.  You have trouble breathing or are short of breath.  You become extremely tired.  You are light-headed or you faint. This information is not intended to replace advice given to you by your health care provider. Make sure you discuss any questions you have with your health  care provider. Document Released: 03/29/2005 Document Revised: 06/21/2016 Document Reviewed: 06/21/2016 Elsevier Interactive Patient Education  2018 [employer].    Supplemental Discharge Instructions for  Pacemaker/Defibrillator Patients  ACTIVITY No heavy lifting or vigorous activity with your left/right arm for 6 to 8 weeks.  Do not raise your left/right arm above your head for one week.  Gradually raise your affected arm as drawn below.           __  NO DRIVING for     ; you may begin driving on     .  WOUND CARE - Keep the wound area clean and dry.  Do not get this area wet for one week. No showers for one week; you may shower on     . - The tape/steri-strips on your wound will fall off; do not pull them off.  No bandage is needed on the site.  DO  NOT apply any creams, oils, or ointments to the wound area. - If you notice any drainage or discharge from the wound, any swelling or bruising at the site, or you develop a fever > 101? F after you are discharged home, call the office at once.  SPECIAL INSTRUCTIONS - You are still able to use cellular telephones; use the ear opposite the side where you have your pacemaker/defibrillator.  Avoid carrying your cellular phone near your device. - When traveling through airports, show security personnel your identification card to avoid being screened in the metal detectors.  Ask the security personnel to use the hand wand. - Avoid arc welding equipment, MRI testing (magnetic resonance imaging), TENS units (transcutaneous nerve stimulators).  Call the office for questions about other devices. - Avoid electrical appliances that are in poor condition or are not properly grounded. - Microwave ovens are safe to be near or to operate.  ADDITIONAL INFORMATION FOR DEFIBRILLATOR PATIENTS SHOULD YOUR DEVICE GO OFF: - If your device goes off ONCE and you feel fine afterward, notify the device clinic nurses. - If your device goes off ONCE and you do  not feel well afterward, call 911. - If your device goes off TWICE, call 911. - If your device goes off THREE TIMES IN ONE DAY, call 911.  DO NOT DRIVE YOURSELF OR A FAMILY MEMBER WITH A DEFIBRILLATOR TO THE HOSPITAL--CALL 911.    [place] - Preparing For Surgery  Before surgery, you can play an important role. Because skin is not sterile, your skin needs to be as free of germs as possible. You can reduce the number of germs on your skin by washing with CHG (chlorahexidine gluconate) Soap before surgery.  CHG is an antiseptic cleaner which kills germs and bonds with the skin to continue killing germs even after washing.   Please do not use if you have an allergy to CHG or antibacterial soaps.  If your skin becomes reddened/irritated stop using the CHG.  Do not shave (including legs and underarms) for at least 48 hours prior to first CHG shower.  It is OK to shave your face.  Please follow these instructions carefully:  1.  Shower the night before surgery and the morning of surgery with CHG.  2.  If you choose to wash your hair, wash your hair first as usual with your normal shampoo.  3.  After you shampoo, rinse your hair and body thoroughly to remove the shampoo.  4.  Use CHG as you would any other liquid soap.  You can apply CHG directly to the skin and wash gently with a clean washcloth. 5.  Apply the CHG Soap to your body ONLY FROM THE NECK DOWN.  Do not use on open wounds or open sores.  Avoid contact with your eyes, ears, mouth and genitals (private parts).  Wash genitals (private parts) with your normal soap.  6.  Wash thoroughly, paying special attention to the area where your surgery will be performed.  7.  Thoroughly rise your body with warm water from the neck down.   8.  DO NOT shower/wash with your normal soap after using and rinsing off the CHG soap.  9.  Pat yourself dry with a clean towel.           10.  Wear clean pajamas.           11.  Place clean sheets on your bed  the night of your first shower and do not sleep with pets.  Day of Surgery: Do not apply any deodorants/lotions.  Please wear clean clothes to the hospital/surgery center.

## 2020-09-28 NOTE — Telephone Encounter (Signed)
Spoke to patient and wife-will come to [facility] now to have labs drawn.  Covid test scheduled after.     Will review instructions with patient when in office.     Message sent to precert.

## 2020-09-29 LAB — BASIC METABOLIC PANEL
BUN/Creatinine Ratio: 17 (ref 10–24)
BUN: 17 mg/dL (ref 8–27)
CO2: 22 mmol/L (ref 20–29)
Calcium: 9.1 mg/dL (ref 8.6–10.2)
Chloride: 99 mmol/L (ref 96–106)
Creatinine, Ser: 1.01 mg/dL (ref 0.76–1.27)
GFR calc Af Amer: 76 mL/min/{1.73_m2} (ref >59)
GFR calc non Af Amer: 66 mL/min/{1.73_m2} (ref >59)
Glucose: 87 mg/dL (ref 65–99)
Potassium: 4.1 mmol/L (ref 3.5–5.2)
Sodium: 138 mmol/L (ref 134–144)

## 2020-09-29 LAB — CBC
Hematocrit: 39.8 % (ref 37.5–51.0)
Hemoglobin: 13.2 g/dL (ref 13.0–17.7)
MCH: 31.1 pg (ref 26.6–33.0)
MCHC: 33.2 g/dL (ref 31.5–35.7)
MCV: 94 fL (ref 79–97)
Platelets: 234 10*3/uL (ref 150–450)
RBC: 4.24 x10E6/uL (ref 4.14–5.80)
RDW: 12.8 % (ref 11.6–15.4)
WBC: 6.1 10*3/uL (ref 3.4–10.8)

## 2020-09-29 LAB — SARS CORONAVIRUS 2 (TAT 6-24 HRS): SARS Coronavirus 2: NEGATIVE

## 2020-10-02 ENCOUNTER — Ambulatory Visit (HOSPITAL_COMMUNITY)
Admission: RE | Admit: 2020-10-02 | Discharge: 2020-10-02 | Disposition: A | Discharge status: Home / Self Care | Payer: Medicare Other | Attending: Cardiovascular Disease | Admitting: Cardiovascular Disease

## 2020-10-02 ENCOUNTER — Ambulatory Visit (HOSPITAL_COMMUNITY): Payer: Medicare Other

## 2020-10-02 ENCOUNTER — Other Ambulatory Visit: Payer: Self-pay

## 2020-10-02 ENCOUNTER — Encounter (HOSPITAL_COMMUNITY)
Admission: RE | Disposition: A | Discharge status: Home / Self Care | Payer: Medicare Other | Source: Home / Self Care | Attending: Cardiovascular Disease

## 2020-10-02 DIAGNOSIS — Z950 Presence of cardiac pacemaker: Secondary | ICD-10-CM

## 2020-10-02 DIAGNOSIS — Z7902 Long term (current) use of antithrombotics/antiplatelets: Secondary | ICD-10-CM | POA: Diagnosis not present

## 2020-10-02 DIAGNOSIS — I442 Atrioventricular block, complete: Secondary | ICD-10-CM | POA: Diagnosis present

## 2020-10-02 DIAGNOSIS — Z79899 Other long term (current) drug therapy: Secondary | ICD-10-CM | POA: Insufficient documentation

## 2020-10-02 DIAGNOSIS — R001 Bradycardia, unspecified: Secondary | ICD-10-CM | POA: Diagnosis not present

## 2020-10-02 DIAGNOSIS — Z87891 Personal history of nicotine dependence: Secondary | ICD-10-CM | POA: Insufficient documentation

## 2020-10-02 HISTORY — PX: PACEMAKER IMPLANT: EP1218

## 2020-10-02 HISTORY — PX: LOOP RECORDER REMOVAL: EP1215

## 2020-10-02 SURGERY — PACEMAKER IMPLANT

## 2020-10-02 MED ORDER — SODIUM CHLORIDE 0.9 % IV SOLN: 250.0000 mL | INTRAVENOUS | Status: DC | PRN

## 2020-10-02 MED ORDER — POVIDONE-IODINE 10 % EX SWAB: 2.0000 "application " | Freq: Once | CUTANEOUS | Status: DC

## 2020-10-02 MED ORDER — SODIUM CHLORIDE 0.9% FLUSH: 3.0000 mL | INTRAVENOUS | Status: DC | PRN

## 2020-10-02 MED ORDER — SODIUM CHLORIDE 0.9% FLUSH: 3.0000 mL | Freq: Two times a day (BID) | INTRAVENOUS | Status: DC

## 2020-10-02 MED ORDER — HEPARIN (PORCINE) IN NACL 1000-0.9 UT/500ML-% IV SOLN
INTRAVENOUS | Status: AC
  Filled 2020-10-02: qty 500

## 2020-10-02 MED ORDER — SODIUM CHLORIDE 0.9 % IV SOLN
250.0000 mL | INTRAVENOUS | Status: DC
  Administered 2020-10-02: 1000 mL via INTRAVENOUS

## 2020-10-02 MED ORDER — LIDOCAINE HCL (PF) 1 % IJ SOLN
INTRAMUSCULAR | Status: AC
  Filled 2020-10-02: qty 60

## 2020-10-02 MED ORDER — SODIUM CHLORIDE 0.9 % IV SOLN
80.0000 mg | INTRAVENOUS | Status: AC
  Administered 2020-10-02: 80 mg

## 2020-10-02 MED ORDER — ACETAMINOPHEN 325 MG PO TABS: 325.0000 mg | ORAL_TABLET | ORAL | Status: DC | PRN

## 2020-10-02 MED ORDER — SODIUM CHLORIDE 0.9 % IV SOLN
INTRAVENOUS | Status: AC
  Filled 2020-10-02: qty 2

## 2020-10-02 MED ORDER — HEPARIN (PORCINE) IN NACL 1000-0.9 UT/500ML-% IV SOLN
INTRAVENOUS | Status: DC | PRN
  Administered 2020-10-02: 500 mL

## 2020-10-02 MED ORDER — CHLORHEXIDINE GLUCONATE 4 % EX LIQD
4.0000 "application " | Freq: Once | CUTANEOUS | Status: DC
  Filled 2020-10-02: qty 60

## 2020-10-02 MED ORDER — SODIUM CHLORIDE 0.9 % IV SOLN: INTRAVENOUS | Status: DC

## 2020-10-02 MED ORDER — LIDOCAINE HCL (PF) 1 % IJ SOLN
INTRAMUSCULAR | Status: DC | PRN
  Administered 2020-10-02: 10 mL
  Administered 2020-10-02: 40 mL

## 2020-10-02 MED ORDER — CEFAZOLIN SODIUM-DEXTROSE 2-4 GM/100ML-% IV SOLN
INTRAVENOUS | Status: AC
  Filled 2020-10-02: qty 100

## 2020-10-02 MED ORDER — CEFAZOLIN SODIUM-DEXTROSE 2-4 GM/100ML-% IV SOLN
2.0000 g | INTRAVENOUS | Status: AC
  Administered 2020-10-02: 2 g via INTRAVENOUS

## 2020-10-02 MED ORDER — ONDANSETRON HCL 4 MG/2ML IJ SOLN: 4.0000 mg | Freq: Four times a day (QID) | INTRAMUSCULAR | Status: DC | PRN

## 2020-10-02 SURGICAL SUPPLY — 9 items
CABLE SURGICAL S-101-97-12 (CABLE) ×2 IMPLANT
IPG PACE AZUR XT DR MRI W1DR01 (Pacemaker) IMPLANT
LEAD CAPSURE NOVUS 5076-52CM (Lead) ×1 IMPLANT
LEAD CAPSURE NOVUS 5076-58CM (Lead) ×1 IMPLANT
PACE AZURE XT DR MRI W1DR01 (Pacemaker) ×2 IMPLANT
PAD PRO RADIOLUCENT 2001M-C (PAD) ×2 IMPLANT
SHEATH 7FR PRELUDE SNAP 13 (SHEATH) ×2 IMPLANT
SYR CONTROL 10ML ANGIOGRAPHIC (SYRINGE) ×1 IMPLANT
TRAY PACEMAKER INSERTION (PACKS) ×2 IMPLANT

## 2020-10-02 NOTE — Op Note (Addendum)
Procedure report  Procedure performed:  1. Implantation of new dual chamber permanent pacemaker 2. Fluoroscopy 3.Loop recorder explantation  Reason for procedure: Symptomatic bradycardia due to: Complete heart block  Procedure performed by: Sanda Klein, MD  Complications: None  Estimated blood loss: <10 mL  Medications administered during procedure: Ancef 2 g intravenously Lidocaine 1% 40 mL locally,   Device details: Generator Medtronic Azure DR model [id number] serial number [id number] G Right atrial lead Medtronic [id number] serial number [id number] Right ventricular lead Medtronic [id number] serial number [id number]  Procedure details:  After the risks and benefits of the procedure were discussed the patient provided informed consent and was brought to the cardiac cath lab in the fasting state. The patient was prepped and draped in usual sterile fashion. Local anesthesia with 1% lidocaine was administered to to the left infraclavicular area. A 5-6 cm horizontal incision was made parallel with and 2-3 cm caudal to the left clavicle. Using electrocautery and blunt dissection a prepectoral pocket was created down to the level of the pectoralis major muscle fascia. The pocket was carefully inspected for hemostasis. An antibiotic-soaked sponge was placed in the pocket.  Under fluoroscopic guidance and using the modified Seldinger technique 2 separate venipunctures were performed to access the left subclavian vein. No difficulty was encountered accessing the vein.  Two J-tip guidewires were subsequently exchanged for two 7 French safe sheaths.  Under fluoroscopic guidance the ventricular lead was advanced to level of the mid to apical right ventricular septum and thet active-fixation helix was deployed. Prominent current of injury was seen. Satisfactory pacing and sensing parameters were recorded. There was no evidence of diaphragmatic stimulation at maximum device output. The safe sheath  was peeled away and the lead was secured in place with 2-0 silk.  In similar fashion the right atrial lead was advanced to the level of the atrial appendage. The active-fixation helix was deployed. There was prominent current of injury. Satisfactory  pacing and sensing parameters were recorded. There was no evidence of diaphragmatic stimulation with pacing at maximum device output. The safe sheath was peeled away and the lead was secured in place with 2-0 silk.  The antibiotic-soaked sponge was removed from the pocket. The pocket was flushed with copious amounts of antibiotic solution. Reinspection showed excellent hemostasis..  The ventricular lead was connected to the generator and appropriate ventricular pacing was seen. Subsequently the atrial lead was also connected. Repeat testing of the lead parameters later showed excellent values.  The entire system was then carefully inserted in the pocket with care been taking that the leads and device assumed a comfortable position without pressure on the incision. Great care was taken that the leads be located deep to the generator. The pocket was then closed in layers using 2 layers of 2-0 Vicryl, one layer of 3-0 Vicryl and cutaneous steristrips, after which a sterile dressing was applied.  At the end of the procedure the following lead parameters were encountered:  Right atrial lead  sensed P waves 2.8 mV, impedance 608 ohms, threshold 1.0 V at 0.4 ms pulse width.  Right ventricular lead sensed R waves 14.5 mV, impedance 874ohms, threshold 0.5 V at 0.4 ms pulse width.  Subsequently, attention was directed to explantation of the loop recorder.  Local anesthesia with 1% lidocaine 8 mL was provided and a 1 cm incision was performed.  The device was easily located and extracted using a hemostat.  2 stitches of 3-0 Vicryl were used to close the device with additional digital pressure  to obtain hemostasis.  Steri-Strips and a sterile dressing were then  applied.  Sanda Klein, MD, [facility] [phone] office [phone] pager

## 2020-10-02 NOTE — Discharge Instructions (Signed)
Supplemental Discharge Instructions for  Pacemaker/Defibrillator Patients  Activity Do not raise your left/right arm above shoulder level or extend it backward beyond shoulder level for 2 weeks. Wear the arm sling as a reminder or as needed for comfort for 2 weeks. No heavy lifting or vigorous activity with your left/right arm for 6-8 weeks.    NO DRIVING is preferable for 2 weeks; If absolutely necessary, drive only short, familiar routes. DO wear your seatbelt, even if it crosses over the pacemaker site.  WOUND CARE - Keep the wound area clean and dry.  Remove the dressing the day after you return home (usually 48 hours after the procedure). - DO NOT SUBMERGE UNDER WATER UNTIL FULLY HEALED (no tub baths, hot tubs, swimming pools, etc.).  - You  may shower or take a sponge bath after the dressing is removed. DO NOT SOAK the area and do not allow the shower to directly spray on the site. - If you have staples, these will be removed in the office in 7-14 days. - If you have tape/steri-strips on your wound, these will fall off; do not pull them off prematurely.   - No bandage is needed on the site.  DO  NOT apply any creams, oils, or ointments to the wound area. - If you notice any drainage or discharge from the wound, any swelling, excessive redness or bruising at the site, or if you develop a fever > 101? F after you are discharged home, call the office at once.  Special Instructions - You are still able to use cellular telephones.  Avoid carrying your cellular phone near your device. - When traveling through airports, show security personnel your identification card to avoid being screened in the metal detectors.  - Avoid arc welding equipment, MRI testing (magnetic resonance imaging), TENS units (transcutaneous nerve stimulators).  Call the office for questions about other devices. - Avoid electrical appliances that are in poor condition or are not properly grounded. - Microwave ovens are  safe to be near or to operate.  After Your Pacemaker    You have a [employer] Pacemaker  ACTIVITY  Do not lift your arm above shoulder height for 2 weeks after your procedure.      Progression of arm movement after 2 weeks.

## 2020-10-02 NOTE — H&P (Signed)
Cardiology Admission History and Physical:   Patient ID: Oscar [name] MRN: [id number]; DOB: [birth year]   Admission date: 10/02/2020  Primary Care Provider: Berkley Harvey, NP [facility] Cardiologist: Ramey Schiff [facility] Electrophysiologist:  None   Chief Complaint:  CHB  Patient Profile:   Oscar [name] is a 87 y.o. male with recent complaints of dizziness/unsteadiness whose implantable loop recorder showed evidence of transient complete heart block, which was symptomatic.  He is here for dual-chamber permanent pacemaker implantation.  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  He has minor coronary artery disease by cardiac catheterization performed in 2020 (maximum 20-30% stenosis in branch vessels) and preserved left ventricular systolic function.  A loop recorder was implanted for recurrent complaints of near syncope.  On January 6 the device showed evidence of a lengthy episode of complete heart block (over 30 seconds) with idioventricular escape rhythm at less than 30 bpm, during which the patient experienced lightheadedness and malaise felt like he was almost about to pass out.   Past Medical History:  Diagnosis Date  . Bladder tumor   . Cervical stenosis of spine   . E. coli infection   . Essential hypertension 03/12/2020  . GERD (gastroesophageal reflux disease)   . History of appendectomy   . History of concussion    X2   NO RESIDUALS  . History of peptic ulcer 1970'S  . Hyperlipidemia   . Irregular heart beat   . LBBB (left bundle branch block)   . Status post placement of implantable  loop recorder 11/08/2014    Past Surgical History:  Procedure Laterality Date  . ANTERIOR CERVICAL DECOMP/DISCECTOMY FUSION N/A 01/27/2018   Procedure: Anterior Cervical Discectomy Fusion - Cervical Four- Cervical Five;  Surgeon: Earnie Larsson, MD;  Location: [place];  Service: Neurosurgery;  Laterality: N/A;  Anterior Cervical Discectomy Fusion - Cervical Four- Cervical Five  . APPENDECTOMY  1970  . CARDIOVASCULAR STRESS TEST  03/22/09  . CYSTOSCOPY WITH BIOPSY N/A 02/10/2013   Procedure: CYSTOSCOPY WITH COLD CUP BIOPSY/FULGERATION;  Surgeon: Bernestine Amass, MD;  Location: [facility];  Service: Urology;  Laterality: N/A;  ALSO FULGERATION   . INGUINAL HERNIA REPAIR Right 07-14-2002  . LEFT HEART CATH AND CORONARY ANGIOGRAPHY N/A 07/13/2019   Procedure: LEFT HEART CATH AND CORONARY ANGIOGRAPHY;  Surgeon: Troy Sine, MD;  Location: [place] [facility];  Service: Cardiovascular;  Laterality: N/A;  . LOOP RECORDER IMPLANT N/A 05/24/2014   Procedure: LOOP RECORDER IMPLANT;  Surgeon: Sanda Klein, MD;  Location: [place] [facility];  Service: Cardiovascular;  Laterality: N/A;  . NECK SURGERY    . TONSILLECTOMY    . TRANSURETHRAL RESECTION OF BLADDER TUMOR  03-10-2008     Medications Prior to Admission: Prior to Admission medications   Medication Sig Start Date End Date Taking? Authorizing Provider  atorvastatin (LIPITOR) 40 MG tablet Take 1 tablet (40 mg total) by mouth daily. 03/15/20  Yes Shelly Coss, MD  b complex vitamins capsule Take 1 capsule by mouth daily.   Yes [provider]  clopidogrel (PLAVIX) 75 MG tablet Take 1 tablet (75 mg total) by mouth daily. 04/18/20  Yes Tobb, Kardie, DO  furosemide (LASIX) 20 MG tablet Take 20 mg (1 tablet) on Tuesday, Thursday and Saturdays. Patient taking differently: Take 20 mg by mouth See admin instructions. Take 20 mg (1 tablet) on Tuesday, Thursday and Saturdays. morning 04/12/20  Yes Revankar, Reita Cliche, MD  gabapentin (NEURONTIN)  100 MG capsule Take 1 capsule (100 mg total) by mouth 3 (three) times daily as needed. Patient taking differently: Take 100 mg by mouth at bedtime. 06/09/20  Yes Marcial Pacas, MD  Glucosamine-Chondroit-Vit C-Mn (GLUCOSAMINE 1500 COMPLEX PO) Take 1,500 mg by mouth daily.   Yes [provider]  loratadine (CLARITIN) 10 MG tablet Take 10 mg by mouth daily.   Yes [provider]  Multiple Vitamins-Minerals (MULTIVITAMIN WITH MINERALS) tablet Take 1 tablet by mouth daily. Dr, Fredirick Lathe   Yes [provider]  Olopatadine HCl 0.2 % SOLN Place 1 drop into both eyes as needed (Dry eye).  03/01/19  Yes [provider]  omeprazole (PRILOSEC) 20 MG capsule Take 20 mg by mouth every morning.   Yes [provider]  Potassium 99 MG TABS Take 99 mg by mouth daily.   Yes [provider]  sertraline (ZOLOFT) 50 MG tablet Take 50 mg by mouth daily. 09/13/20  Yes [provider]  tamsulosin (FLOMAX) 0.4 MG CAPS capsule Take 0.4 mg by mouth daily. 09/22/20  Yes [provider]     Allergies:   No Known Allergies  Social History:   Social History   Socioeconomic History  . Marital status: Married    Spouse name: Not on file  . Number of children: Not on file  . Years of education: Not on file  . Highest education level: Not on file  Occupational History  . Occupation: retired  Tobacco Use  . Smoking status: Former Smoker    Packs/day: 0.50    Years: 30.00    Pack years: 15.00    Types: Cigarettes    Quit date: 02/06/1979    Years since quitting: 41.6  . Smokeless tobacco: Never Used  Vaping Use  . Vaping Use: Never used  Substance and Sexual Activity  . Alcohol use: Yes    Alcohol/week: 0.0 standard drinks    Comment: 1 glass of beer once or twice a month  . Drug use: No  . Sexual activity: Not on file  Other Topics Concern  . Not on file  Social History Narrative  . Not on file   Social Determinants of Health   Financial  Resource Strain: Not on file  Food Insecurity: Not on file  Transportation Needs: No Transportation Needs  . Lack of Transportation (Medical): No  . Lack of Transportation (Non-Medical): No  Physical Activity: Not on file  Stress: Not on file  Social Connections: Not on file  Intimate Partner Violence: Not on file    Family History:   The patient's family history includes Diabetes in his brother; Heart failure in his brother and mother; Stroke in his father.    ROS:  Please see the history of present illness.  All other ROS reviewed and negative.     Physical Exam/Data:   Vitals:   10/02/20 1215  BP: (!) 175/84  Pulse: 65  Resp: 16  Temp: 98.8 F (37.1 C)  TempSrc: Oral  SpO2: 100%  Weight: 65.8 kg  Height: 5\' 7"  (1.702 m)   No intake or output data in the 24 hours ending 10/02/20 1319 Last 3 Weights 10/02/2020 08/03/2020 05/15/2020  Weight (lbs) 145 lb 155 lb 6.4 oz 155 lb 1.9 oz  Weight (kg) 65.772 kg 70.489 kg 70.362 kg     Body mass index is 22.71 kg/m.  General:  Well nourished, well developed, in no acute distress HEENT: normal Lymph: no adenopathy Neck: no JVD Endocrine:  No thryomegaly Vascular: No carotid bruits; FA pulses 2+ bilaterally without bruits  Cardiac:  normal S1, paradoxically split S2; RRR; no diastolic murmur, 1/6 aortic ejection murmur is early peaking Lungs:  clear to auscultation bilaterally, no wheezing, rhonchi or rales  Abd: soft, nontender, no hepatomegaly  Ext: no edema Musculoskeletal:  No deformities, BUE and BLE strength normal and equal Skin: warm and dry  Neuro:  CNs 2-12 intact, no focal abnormalities noted Psych:  Normal affect    EKG:  The ECG that was done  was personally reviewed and demonstrates sinus rhythm with first-degree AV block and left bundle branch block  Relevant CV Studies: Loop recorder download September 28, 2020 shows complete heart block  Cardiac cath 2020   Prox RCA lesion is 10% stenosed.  Prox Cx to  Mid Cx lesion is 20% stenosed.  Mid Cx lesion is 30% stenosed.  1st Diag lesion is 20% stenosed.  The left ventricular systolic function is normal.  LV end diastolic pressure is normal.  The left ventricular ejection fraction is 50-55% by visual estimate.  There is trivial (1+) mitral regurgitation.  Mild nonobstructive CAD with 20% narrowing in a proximal diagonal branch of the LAD prior to its bifurcation and a otherwise normal LAD system; smooth proximal 20% left circumflex stenosis followed by 30% smooth eccentric mid AV groove stenosis; and mild 10% smooth narrowing in the mid dominant RCA.  Low normal global LV function with EF estimate at 50 to 55% in this patient with chronic left bundle branch block. There appears to be only trivial mitral regurgitation. LVEDP 13 mm Hg.  RECOMMENDATION: Medical therapy for mild nonobstructive CAD.  Echo 03/12/2020 1. Left ventricular ejection fraction, by estimation, is 55 to 60%. The  left ventricle has normal function. The left ventricle has no regional  wall motion abnormalities. There is moderate concentric left ventricular  hypertrophy. Left ventricular  diastolic parameters are consistent with Grade I diastolic dysfunction  (impaired relaxation). Elevated left atrial pressure.  2. Right ventricular systolic function is normal. The right ventricular  size is normal. There is normal pulmonary artery systolic pressure.  3. The mitral valve is normal in structure. Mild mitral valve  regurgitation. No evidence of mitral stenosis.  4. The aortic valve is normal in structure. Aortic valve regurgitation is  not visualized. Mild to moderate aortic valve sclerosis/calcification is  present, without any evidence of aortic stenosis.  5. Aortic dilatation noted. There is mild dilatation of the ascending  aorta measuring 41 mm.  6. The inferior vena cava is normal in size with greater than 50%  respiratory variability, suggesting  right atrial pressure of 3 mmHg.   Laboratory Data:  High Sensitivity Troponin:  No results for input(s): TROPONINIHS in the last 720 hours.    Chemistry Recent Labs  Lab 09/28/20 1222  NA 138  K 4.1  CL 99  CO2 22  GLUCOSE 87  BUN 17  CREATININE 1.01  CALCIUM 9.1  GFRNONAA 66  GFRAA 76    No results for input(s): PROT, ALBUMIN, AST, ALT, ALKPHOS, BILITOT in the last 168 hours. Hematology Recent Labs  Lab 09/28/20 1222  WBC 6.1  RBC 4.24  HGB  13.2  HCT 39.8  MCV 94  MCH 31.1  MCHC 33.2  RDW 12.8  PLT 234   BNPNo results for input(s): BNP, PROBNP in the last 168 hours.  DDimer No results for input(s): DDIMER in the last 168 hours.   Radiology/Studies:  No results found.   Assessment and Plan:   1. Caprice Drakos has transient complete heart block associated with near syncope, not related to medications or other reversible conditions.  He should undergo dual-chamber permanent pacemaker implantation.  This was discussed with him in detail. This procedure has been fully reviewed with the patient and written informed consent has been obtained.    For questions or updates, please contact [place] Please consult [email or web address] for contact info under     Signed, Sanda Klein, MD  10/02/2020 1:19 PM

## 2020-10-03 ENCOUNTER — Encounter (HOSPITAL_COMMUNITY): Payer: Self-pay | Admitting: Cardiovascular Disease

## 2020-10-04 ENCOUNTER — Encounter (HOSPITAL_COMMUNITY): Payer: Self-pay | Admitting: Cardiovascular Disease

## 2020-10-05 IMAGING — MR MR HEAD W/O CM
6 of 7 series · 32 of 48 positions shown · non-contrast
Comparison: 03/12/2020

CLINICAL DATA: Stroke, follow-up; research study protocol

EXAM:
MRI HEAD WITHOUT CONTRAST
TECHNIQUE: Multiplanar, multiecho pulse sequences of the brain and surrounding
structures were obtained without intravenous contrast.

[Series 2: DWI · axial · 3.0mm · 0.94mm/px · z∈[-28,+109]mm · 8 of 102 slices shown]
[im 1/102]
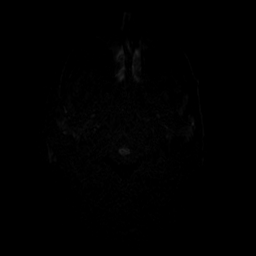
[im 12/102]
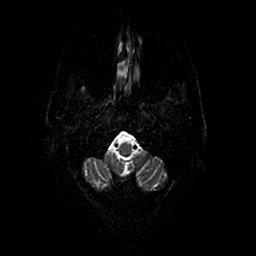
[im 34/102]
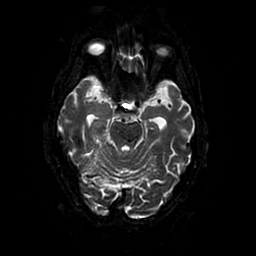
[im 45/102]
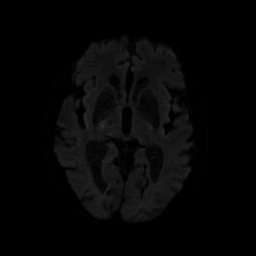
[im 57/102]
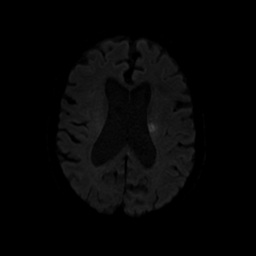
[im 68/102]
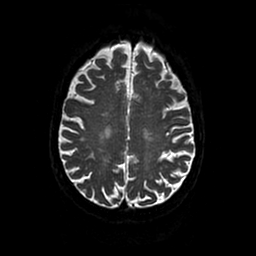
[im 90/102]
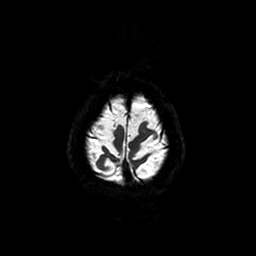
[im 102/102]
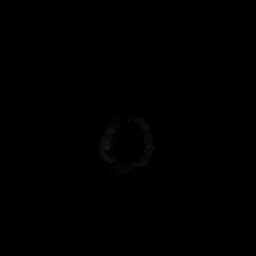

[Series 3: FLAIR · axial · 3.0mm · 0.90mm/px · z∈[-30,+106]mm · 5 of 51 slices shown]
[im 1/51]
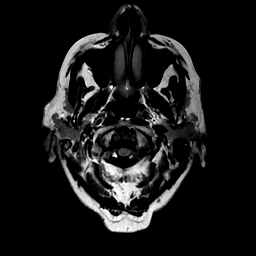
[im 13/51]
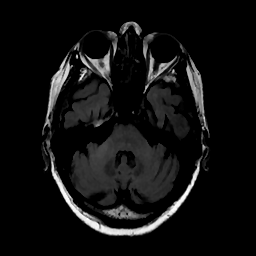
[im 26/51]
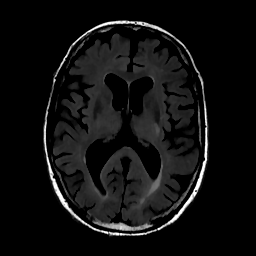
[im 38/51]
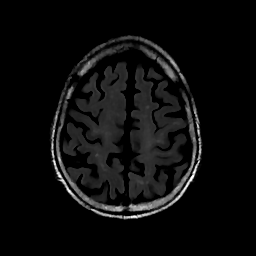
[im 51/51]
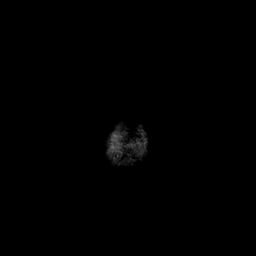

[Series 4: SWI · axial · 3.0mm · 0.47mm/px · z∈[-29,+112]mm · 8 of 104 slices shown (1 of 2)]
[im 1/104]
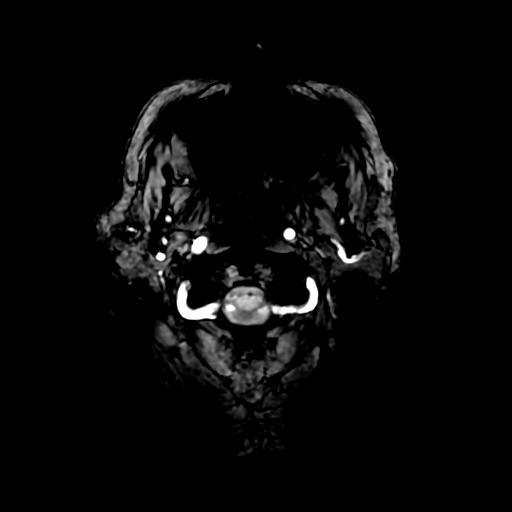
[im 12/104]
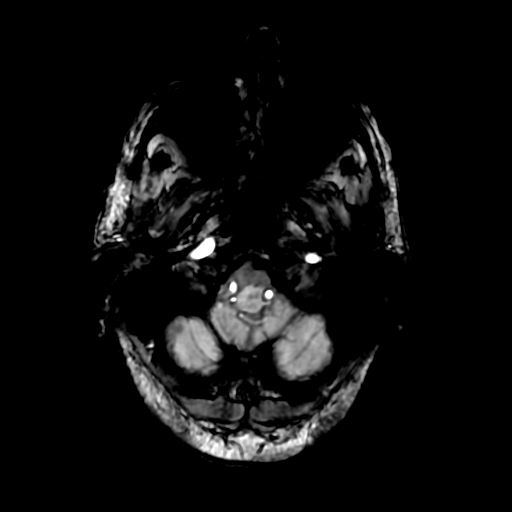
[im 35/104]
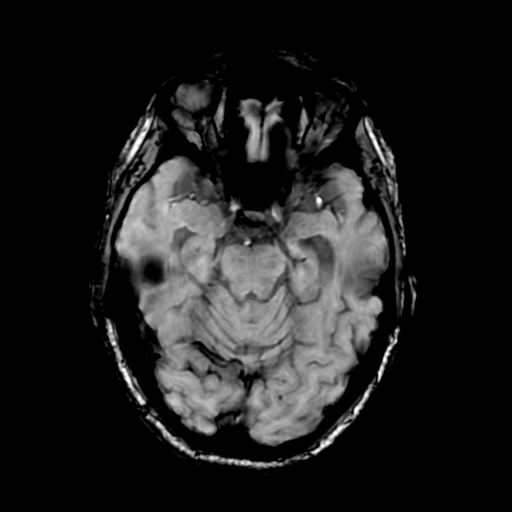
[im 46/104]
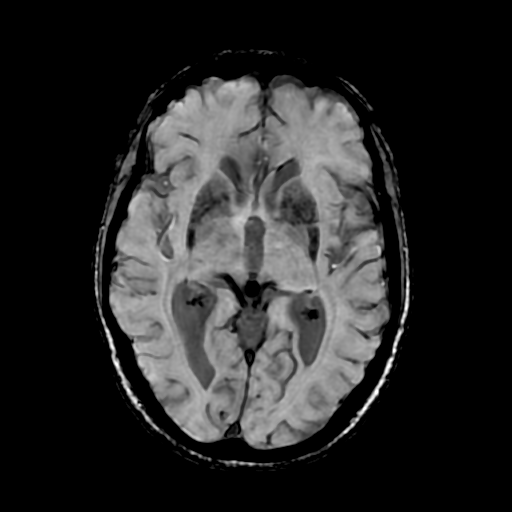
[im 58/104]
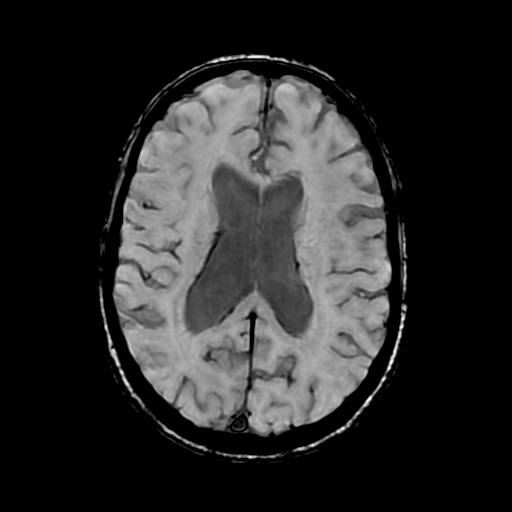
[im 69/104]
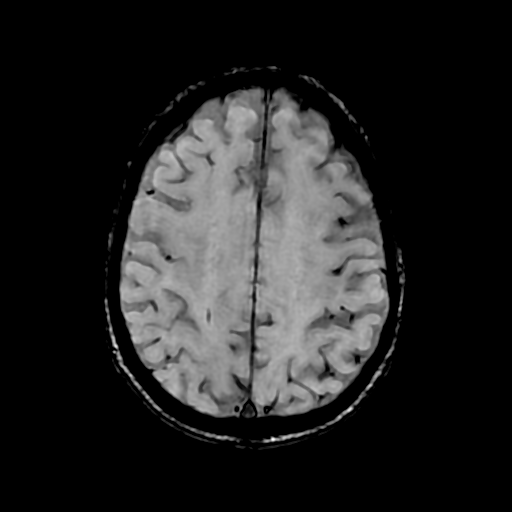
[im 92/104]
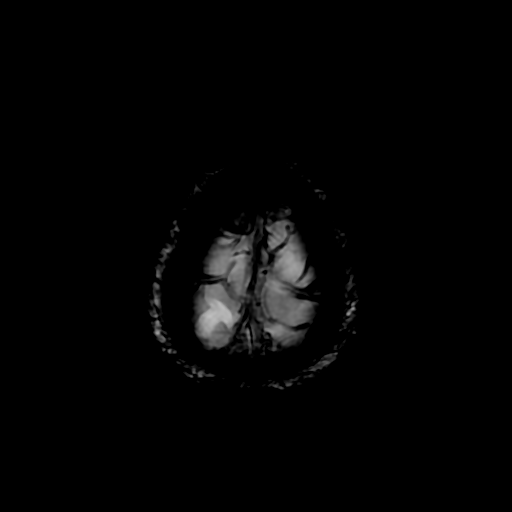
[im 104/104]
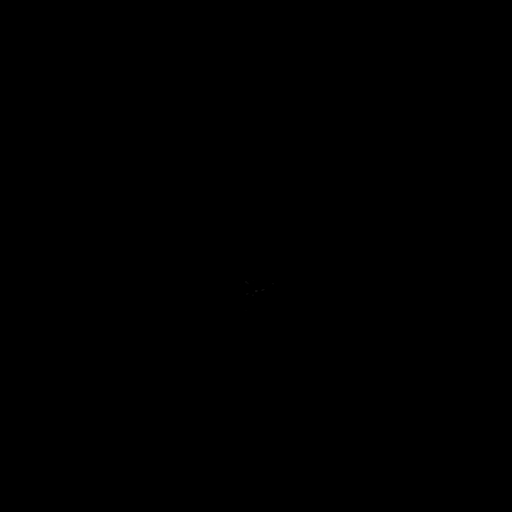

[Series 5: T2 · axial · 5.0mm · 0.47mm/px · z∈[-27,+109]mm · 3 of 26 slices shown]
[im 1/26]
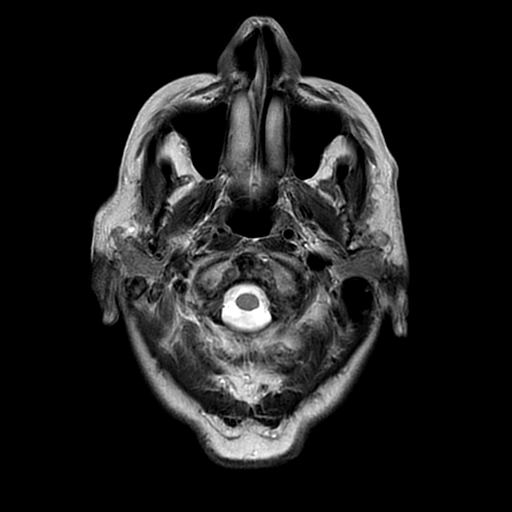
[im 13/26]
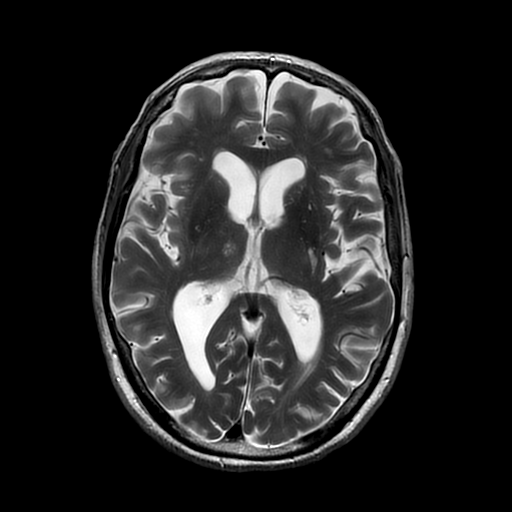
[im 26/26]
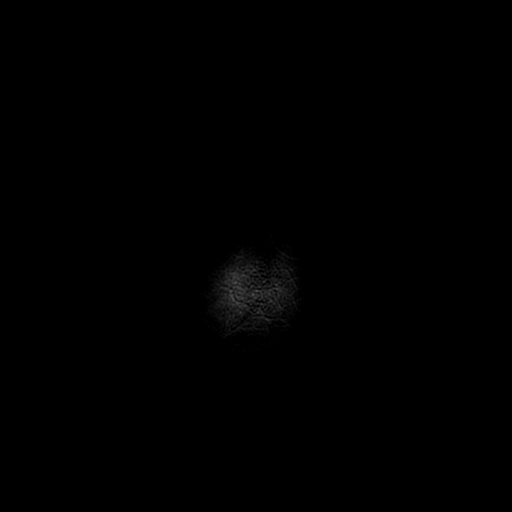

[Series 250: ADC · axial · 3.0mm · 0.94mm/px · z∈[-28,+109]mm · 5 of 51 slices shown]
[im 1/51]
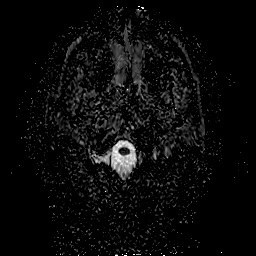
[im 13/51]
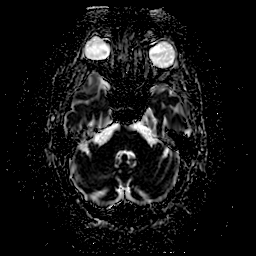
[im 26/51]
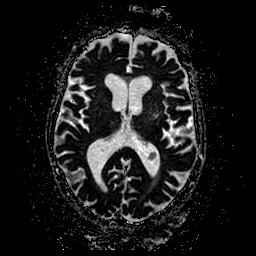
[im 38/51]
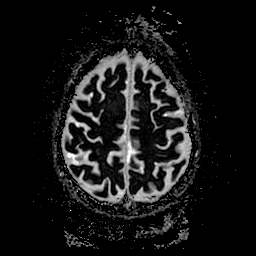
[im 51/51]
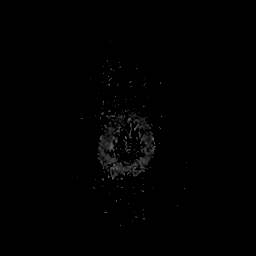

[Series 400: SWI · axial · 3.0mm · 0.47mm/px · z∈[-29,+18]mm · 3 of 103 slices shown (2 of 2)]
[im 1/103]
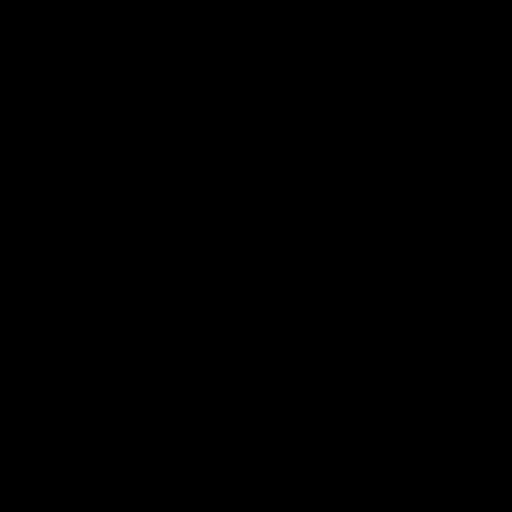
[im 12/103]
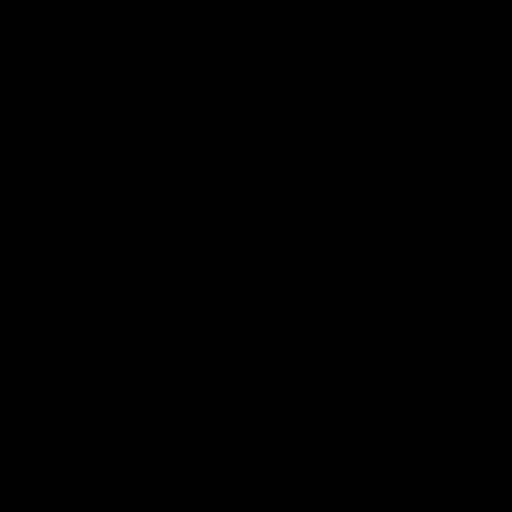
[im 35/103]
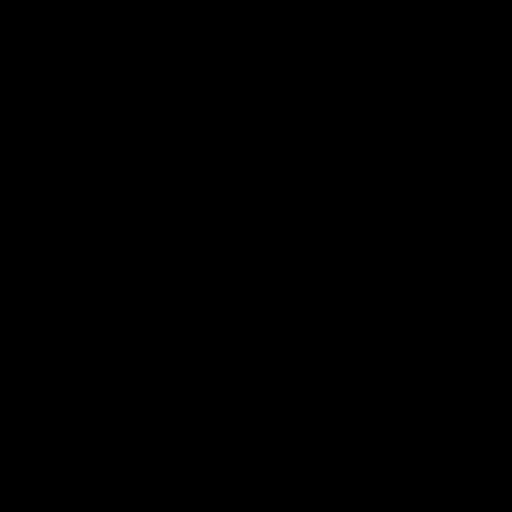

[32 of 48 positions shown; findings below may reference images not displayed]

FINDINGS: Brain: Stable recent infarct involving the left basal ganglia and
corona radiata. No evidence intracranial hemorrhage. Ventricles are
stable in size. Stable findings of probable chronic microvascular
ischemic changes.

Vascular: Major vessel flow voids at the skull base remain
preserved.

Skull and upper cervical spine: Marrow signal is within normal
limits.

Sinuses/Orbits: No new finding.

Other: None.
IMPRESSION: No substantial change in evolving recent infarction involving the
left basal ganglia and corona radiata.

## 2020-10-08 ENCOUNTER — Other Ambulatory Visit: Payer: Self-pay

## 2020-10-08 ENCOUNTER — Emergency Department (HOSPITAL_COMMUNITY): Payer: Medicare Other

## 2020-10-08 ENCOUNTER — Emergency Department (HOSPITAL_COMMUNITY)
Admission: EM | Admit: 2020-10-08 | Discharge: 2020-10-08 | Disposition: A | Discharge status: Home / Self Care | Payer: Medicare Other | Attending: Emergency Medicine | Admitting: Emergency Medicine

## 2020-10-08 DIAGNOSIS — Z8551 Personal history of malignant neoplasm of bladder: Secondary | ICD-10-CM | POA: Diagnosis not present

## 2020-10-08 DIAGNOSIS — Z87891 Personal history of nicotine dependence: Secondary | ICD-10-CM | POA: Diagnosis not present

## 2020-10-08 DIAGNOSIS — I951 Orthostatic hypotension: Secondary | ICD-10-CM | POA: Diagnosis not present

## 2020-10-08 DIAGNOSIS — R42 Dizziness and giddiness: Secondary | ICD-10-CM | POA: Diagnosis present

## 2020-10-08 DIAGNOSIS — I10 Essential (primary) hypertension: Secondary | ICD-10-CM | POA: Diagnosis not present

## 2020-10-08 DIAGNOSIS — I2510 Atherosclerotic heart disease of native coronary artery without angina pectoris: Secondary | ICD-10-CM | POA: Insufficient documentation

## 2020-10-08 DIAGNOSIS — Z79899 Other long term (current) drug therapy: Secondary | ICD-10-CM | POA: Insufficient documentation

## 2020-10-08 DIAGNOSIS — Z950 Presence of cardiac pacemaker: Secondary | ICD-10-CM | POA: Insufficient documentation

## 2020-10-08 LAB — CBC WITH DIFFERENTIAL/PLATELET
Abs Immature Granulocytes: 0.03 10*3/uL (ref 0.00–0.07)
Basophils Absolute: 0 10*3/uL (ref 0.0–0.1)
Basophils Relative: 0 %
Eosinophils Absolute: 0.2 10*3/uL (ref 0.0–0.5)
Eosinophils Relative: 2 %
HCT: 40.9 % (ref 39.0–52.0)
Hemoglobin: 13.2 g/dL (ref 13.0–17.0)
Immature Granulocytes: 0 %
Lymphocytes Relative: 8 %
Lymphs Abs: 0.6 10*3/uL — ABNORMAL LOW (ref 0.7–4.0)
MCH: 31.1 pg (ref 26.0–34.0)
MCHC: 32.3 g/dL (ref 30.0–36.0)
MCV: 96.2 fL (ref 80.0–100.0)
Monocytes Absolute: 0.8 10*3/uL (ref 0.1–1.0)
Monocytes Relative: 10 %
Neutro Abs: 6.1 10*3/uL (ref 1.7–7.7)
Neutrophils Relative %: 80 %
Platelets: 140 10*3/uL — ABNORMAL LOW (ref 150–400)
RBC: 4.25 MIL/uL (ref 4.22–5.81)
RDW: 13 % (ref 11.5–15.5)
WBC: 7.8 10*3/uL (ref 4.0–10.5)
nRBC: 0 % (ref 0.0–0.2)

## 2020-10-08 LAB — COMPREHENSIVE METABOLIC PANEL
ALT: 19 U/L (ref 0–44)
AST: 21 U/L (ref 15–41)
Albumin: 3.7 g/dL (ref 3.5–5.0)
Alkaline Phosphatase: 61 U/L (ref 38–126)
Anion gap: 8 (ref 5–15)
BUN: 19 mg/dL (ref 8–23)
CO2: 23 mmol/L (ref 22–32)
Calcium: 9 mg/dL (ref 8.9–10.3)
Chloride: 105 mmol/L (ref 98–111)
Creatinine, Ser: 1.08 mg/dL (ref 0.61–1.24)
GFR, Estimated: >60 mL/min (ref >60)
Glucose, Bld: 106 mg/dL — ABNORMAL HIGH (ref 70–99)
Potassium: 4.4 mmol/L (ref 3.5–5.1)
Sodium: 136 mmol/L (ref 135–145)
Total Bilirubin: 0.7 mg/dL (ref 0.3–1.2)
Total Protein: 6.2 g/dL — ABNORMAL LOW (ref 6.5–8.1)

## 2020-10-08 LAB — TSH: TSH: 2.877 u[IU]/mL (ref 0.350–4.500)

## 2020-10-08 LAB — CBG MONITORING, ED: Glucose-Capillary: 86 mg/dL (ref 70–99)

## 2020-10-08 MED ORDER — SODIUM CHLORIDE 0.9 % IV BOLUS
500.0000 mL | Freq: Once | INTRAVENOUS | Status: AC
  Administered 2020-10-08: 500 mL via INTRAVENOUS

## 2020-10-08 NOTE — ED Triage Notes (Signed)
Pt from home via EMS, had a pacemaker placed on 1/10. Ever since then, has been dizzy when he stands up. +orthostatic changes with EMS: 120/80-->80/62 with HR change from 70-->110. Pt's wife also reports that when he begins to fall asleep she finds him gasping for air, ongoing for one month.

## 2020-10-08 NOTE — ED Provider Notes (Signed)
[facility] EMERGENCY DEPARTMENT Provider Note   CSN: [id number] Arrival date & time: 10/08/20  1126     History No chief complaint on file.   Oscar [name] is a 87 y.o. male with PMHx HTN, HLD, CVA on Plavix, CAD, and history of CHB with recent pacemaker placement (1/10) who presents to the ED via EMS for CC of hypotension.   Per EMS wife called them today due to pt gasping for air when he is sleeping as well as dizziness. Pt was found to be orthostatic with them with a BP 120/80 to 80/62 and HR from 70 to 110 with standing. Pt had only gotten about 50 CCs of fluids when he arrived; BP now 150/81. They report that remainder of vitals are stable. The gasping for air has been ongoing for the past month however they believe the dizziness is new.   Pt reports that he believes the gasping for air has been ongoing for about 1 month and that his wife noticed it while he was sleeping. He reports he was dozing off this morning when it happened again prompting his wife to be concerned. Pt also reports he has had multiple episodes of dizziness/pre syncope with an episode earlier today as well.He reports he is most dizzy in the mornings when he wakes up and he will check his blood pressure at that time and it will be low. Pt is not on any BP meds. He states that after he eats and goes about his day then his blood pressure will slowly rise and his dizziness will improve with prolonged standing.   Additional information obtained by wife [name] on the phone - reports that both of these symptoms have been ongoing for "some time" and that he had the pacemaker placed for these two reasons. She states that since having the pacemaker placed she has not noticed any changes. Pt was dozing off today when she heard his "gasping" for air in the other room. She ran over to him and woke him up which stopped the gasping. She then asked him if she should call the ambulance and he told her yes so she did.    Per chart review: Pt had a loop recorder placed for recurrent complains of near syncope. On 1/06 the device showed evidence of a lengthy episode of complete heart block of over 30 seconds with idioventricular escape rhythm at less than 30 bpm during which pt experienced lightheadedness and malaise with near syncope. It was then decided to have a pacemaker placed by Dr. Sallyanne Kuster on 1/10. Pt has not followed up with Dr. Loletha Grayer since   The history is provided by the patient, medical records, the spouse and the EMS personnel.       Past Medical History:  Diagnosis Date  . Bladder tumor   . Cervical stenosis of spine   . E. coli infection   . Essential hypertension 03/12/2020  . GERD (gastroesophageal reflux disease)   . History of appendectomy   . History of concussion    X2   NO RESIDUALS  . History of peptic ulcer 1970'S  . Hyperlipidemia   . Irregular heart beat   . LBBB (left bundle branch block)   . Status post placement of implantable loop recorder 11/08/2014    Patient Active Problem List   Diagnosis Date Noted  . CHB (complete heart block) ([place]) 09/28/2020  . CVA (cerebral vascular accident) ([place]) 03/12/2020  . Acute CVA (cerebrovascular accident) ([place]) 03/12/2020  .  Hypothyroidism (acquired) 03/12/2020  . Essential hypertension 03/12/2020  . Atrial tachycardia ([place]) 01/24/2020  . Atrial tachycardia, paroxysmal ([place]) 08/05/2019  . Coronary artery disease involving native coronary artery of native heart without angina pectoris 08/05/2019  . Mixed hyperlipidemia 08/05/2019  . Abnormal nuclear stress test   . Exertional dyspnea   . 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 Cervical myelopathy ([place]) 01/27/2018  . Status post placement of implantable loop recorder 11/08/2014  . Near syncope 05/16/2014  . Cancer of bladder ([place]) 02/10/2013    Past Surgical History:  Procedure Laterality Date  . ANTERIOR CERVICAL DECOMP/DISCECTOMY FUSION N/A 01/27/2018    Procedure: Anterior Cervical Discectomy Fusion - Cervical Four- Cervical Five;  Surgeon: Earnie Larsson, MD;  Location: [facility];  Service: Neurosurgery;  Laterality: N/A;  Anterior Cervical Discectomy Fusion - Cervical Four- Cervical Five  . APPENDECTOMY  1970  . CARDIOVASCULAR STRESS TEST  03/22/09  . CYSTOSCOPY WITH BIOPSY N/A 02/10/2013   Procedure: CYSTOSCOPY WITH COLD CUP BIOPSY/FULGERATION;  Surgeon: Bernestine Amass, MD;  Location: [facility];  Service: Urology;  Laterality: N/A;  ALSO FULGERATION   . INGUINAL HERNIA REPAIR Right 07-14-2002  . LEFT HEART CATH AND CORONARY ANGIOGRAPHY N/A 07/13/2019   Procedure: LEFT HEART CATH AND CORONARY ANGIOGRAPHY;  Surgeon: Troy Sine, MD;  Location: [facility] [employer] [facility];  Service: Cardiovascular;  Laterality: N/A;  . LOOP RECORDER IMPLANT N/A 05/24/2014   Procedure: LOOP RECORDER IMPLANT;  Surgeon: Sanda Klein, MD;  Location: [facility] CATH LAB;  Service: Cardiovascular;  Laterality: N/A;  . LOOP RECORDER REMOVAL N/A 10/02/2020   Procedure: LOOP RECORDER REMOVAL;  Surgeon: Sanda Klein, MD;  Location: [facility];  Service: Cardiovascular;  Laterality: N/A;  . NECK SURGERY    . PACEMAKER IMPLANT N/A 10/02/2020   Procedure: PACEMAKER IMPLANT;  Surgeon: Sanda Klein, MD;  Location: [facility];  Service: Cardiovascular;  Laterality: N/A;  . TONSILLECTOMY    . TRANSURETHRAL RESECTION OF BLADDER TUMOR  03-10-2008       Family History  Problem Relation Age of Onset  . Heart failure Mother   . Stroke Father   . Diabetes Brother   . Heart failure Brother     Social History   Tobacco Use  . Smoking status: Former Smoker    Packs/day: 0.50    Years: 30.00    Pack years: 15.00    Types: Cigarettes    Quit date: 02/06/1979    Years since quitting: 41.6  . Smokeless tobacco: Never Used  Vaping Use  . Vaping Use: Never used  Substance Use Topics  . Alcohol use: Yes    Alcohol/week: 0.0 standard drinks    Comment: 1  glass of beer once or twice a month  . Drug use: No    Home Medications Prior to Admission medications   Medication Sig Start Date End Date Taking? Authorizing Provider  atorvastatin (LIPITOR) 40 MG tablet Take 1 tablet (40 mg total) by mouth daily. 03/15/20   Shelly Coss, MD  b complex vitamins capsule Take 1 capsule by mouth daily.    [provider]  clopidogrel (PLAVIX) 75 MG tablet Take 1 tablet (75 mg total) by mouth daily. 04/18/20   Tobb, Godfrey Pick, DO  furosemide (LASIX) 20 MG tablet Take 20 mg (1 tablet) on Tuesday, Thursday and Saturdays. Patient taking differently: Take 20 mg by mouth See admin instructions. Take 20 mg (1 tablet) on Tuesday, Thursday and Saturdays. morning 04/12/20  Revankar, Reita Cliche, MD  gabapentin (NEURONTIN) 100 MG capsule Take 1 capsule (100 mg total) by mouth 3 (three) times daily as needed. Patient taking differently: Take 100 mg by mouth at bedtime. 06/09/20   Marcial Pacas, MD  Glucosamine-Chondroit-Vit C-Mn (GLUCOSAMINE 1500 COMPLEX PO) Take 1,500 mg by mouth daily.    [provider]  loratadine (CLARITIN) 10 MG tablet Take 10 mg by mouth daily.    [provider]  Multiple Vitamins-Minerals (MULTIVITAMIN WITH MINERALS) tablet Take 1 tablet by mouth daily. Dr, Fredirick Lathe    [provider]  Olopatadine HCl 0.2 % SOLN Place 1 drop into both eyes as needed (Dry eye).  03/01/19   [provider]  omeprazole (PRILOSEC) 20 MG capsule Take 20 mg by mouth every morning.    [provider]  Potassium 99 MG TABS Take 99 mg by mouth daily.    [provider]  sertraline (ZOLOFT) 50 MG tablet Take 50 mg by mouth daily. 09/13/20   [provider]  tamsulosin (FLOMAX) 0.4 MG CAPS capsule Take 0.4 mg by mouth daily. 09/22/20   [provider]    Allergies    Patient has no known allergies.  Review of Systems   Review of Systems  Constitutional: Negative for chills and fever.  Respiratory:  Positive for apnea. Negative for cough and shortness of breath.   Cardiovascular: Negative for chest pain.  Neurological: Positive for dizziness and light-headedness. Negative for syncope.  All other systems reviewed and are negative.   Physical Exam Updated Vital Signs BP (!) 150/81 (BP Location: Right Arm)   Pulse 78   Temp 98.5 F (36.9 C) (Oral)   Resp (!) 21   SpO2 99%   Physical Exam Vitals and nursing note reviewed.  Constitutional:      Appearance: He is not ill-appearing or diaphoretic.     Comments: Elderly male with L arm sling in place s/p recent pacemaker placement  HENT:     Head: Normocephalic and atraumatic.  Eyes:     Conjunctiva/sclera: Conjunctivae normal.  Cardiovascular:     Rate and Rhythm: Normal rate and regular rhythm.     Pulses: Normal pulses.     Comments: Dressing to left chest wall over pacemaker insertion site. No erythema or edema appreciated to surrounding skin.  Pulmonary:     Effort: Pulmonary effort is normal. No respiratory distress.     Breath sounds: Normal breath sounds. No wheezing, rhonchi or rales.     Comments: Speaking in full sentences without difficulty. Satting 99% on RA. LCTAB.  Abdominal:     Palpations: Abdomen is soft.     Tenderness: There is no abdominal tenderness. There is no guarding or rebound.  Musculoskeletal:     Cervical back: Neck supple.     Right lower leg: No edema.     Left lower leg: No edema.  Skin:    General: Skin is warm and dry.  Neurological:     General: No focal deficit present.     Mental Status: He is alert and oriented to person, place, and time.     Comments: Alert and oriented to self, place, time and event.   Speech is fluent, clear without dysarthria or dysphasia.   Strength 5/5 in upper/lower extremities  Sensation intact in upper/lower extremities      ED Results / Procedures / Treatments   Labs (all labs ordered are listed, but only abnormal results are displayed) Labs  Reviewed  COMPREHENSIVE  METABOLIC PANEL - Abnormal; Notable for the following components:      Result Value   Glucose, Bld 106 (*)    Total Protein 6.2 (*)    All other components within normal limits  CBC WITH DIFFERENTIAL/PLATELET - Abnormal; Notable for the following components:   Platelets 140 (*)    Lymphs Abs 0.6 (*)    All other components within normal limits  TSH  T4  CBG MONITORING, ED    EKG EKG Interpretation  Date/Time:  Sunday October 08 2020 11:43:12 EST Ventricular Rate:  74 PR Interval:    QRS Duration: 126 QT Interval:  379 QTC Calculation: 421 R Axis:   16 Text Interpretation: Sinus rhythm Left bundle branch block No significant change since last tracing Confirmed by Plunkett, Whitney ([id number]) on 10/08/2020 11:52:36 AM   Radiology DG Chest Port 1 View  Result Date: 10/08/2020 CLINICAL DATA:  Shortness of breath. EXAM: PORTABLE CHEST 1 VIEW COMPARISON:  October 02, 2020. FINDINGS: The heart size and mediastinal contours are within normal limits. Both lungs are clear. No pneumothorax or pleural effusion is noted. Left-sided pacemaker is unchanged in position. The visualized skeletal structures are unremarkable. IMPRESSION: No active disease. Aortic Atherosclerosis (ICD10-I70.0). Electronically Signed   By: James  Green Jr M.D.   On: 10/08/2020 12:20    Procedures Procedures (including critical care time)  Medications Ordered in ED Medications  sodium chloride 0.9 % bolus 500 mL (0 mLs Intravenous Stopped 10/08/20 1314)    ED Course  I have reviewed the triage vital signs and the nursing notes.  Pertinent labs & imaging results that were available during my care of the patient were reviewed by me and considered in my medical decision making (see chart for details).    MDM Rules/Calculators/A&P                          87  year old male who presents to the ED via EMS with complaints of orthostatic hypotension.  Wife initially called EMS due to episode of  "gasping" while patient was dozing off today.  He has been doing this for the past month approximately.  No history of sleep apnea.  Only happens when he sleeps.  Wife also called out due to intermittent dizziness/lightheadedness/near syncope which has been ongoing for some time.  Patient had a recent pacemaker placed secondary to findings of a lengthy episode of complete heart block on his loop recorder that was initially placed due to episodes of near syncope and dizziness.  Wife reports that she has not seen any change since having the pacemaker placed however she denies any worsening symptoms.  Found to be positive for orthostatic hypotension with EMS, received 50 cc fluid bolus.  On arrival to the ED vitals are stable.  Patient is afebrile, nontachycardic and nontachypneic.  He appears to be in no acute distress.  Blood pressure slightly elevated at 150/81.  She is alert and oriented x4 he is following commands without difficulty and no focal neurodeficits on exam.  He states currently he feels fine and has no complaints.  He is overall well-appearing.  It does appear that he was recently taken off of his thyroid medication and is planning to have his TSH and T4 checked next week, question if this is related to patient's dizziness/near syncope.  We will plan to check these including additional CBC, CMP, CBG, chest x-ray, EKG.  We will plan to interrogate pacemaker.  I do feel that  patient's gasping while he is sleeping is likely related to sleep apnea however difficult to say.  He denies any shortness of breath currently and appears to be in no acute distress.  Given he has no chest pain or tachycardia I have very low suspicion for PE.  Doubt ACS at this time.  Do not feel he needs troponin or D-dimer.  I do feel he will likely need a sleep study in the future and can follow-up with PCP for same.  If no acute findings today patient likely will be discharged home with close cardiology follow-up given they have  been the ones managing this dizziness.   Orthostatics positive. Will provide 500 CC fluid bolus.  Orthostatic Lying  BP- Lying:130/81 Pulse- Lying:70 Orthostatic Sitting BP- Sitting:114/60 Pulse- Sitting:84 Orthostatic Standing at 0 minutes BP- Standing at 0 minutes:95/80 Pulse- Standing at 0 minutes:90 Orthostatic Standing at 3 minutes BP- Standing at 3 minutes:117/90 Pulse- Standing at 3 minutes:95  TSH normal at 2.877. T4 will not return today but pt's PCP can follow up.   Awaiting pacemaker interrogation results - delayed as there was confusion between nursing staff. She is repeating now. AT shift change case signed out to Dr. Suzzanne Cloud Resident who will dispo patient accordingly.   This note was prepared using Dragon voice recognition software and may include unintentional dictation errors due to the inherent limitations of voice recognition software.  Final Clinical Impression(s) / ED Diagnoses Final diagnoses:  Orthostatic hypotension  Dizziness    Rx / DC Orders ED Discharge Orders    None       Eustaquio Maize, PA-C 10/08/20 1515    Blanchie Dessert, MD 10/12/20 [phone]

## 2020-10-08 NOTE — ED Provider Notes (Signed)
  Physical Exam  BP (!) 150/69   Pulse 78   Temp 98.5 F (36.9 C) (Oral)   Resp 19   SpO2 99%   Physical Exam Constitutional:      General: He is not in acute distress.    Appearance: He is normal weight. He is not ill-appearing.  Cardiovascular:     Rate and Rhythm: Normal rate and regular rhythm.  Skin:    General: Skin is warm.  Neurological:     General: No focal deficit present.     Mental Status: He is alert and oriented to person, place, and time.     Gait: Gait is intact. Gait normal.     ED Course/Procedures     Procedures  MDM  Oscar [name] is a 87 y.o. male with PMHx HTN, HLD, CVA on Plavix, CAD, and history of CHB with recent pacemaker placement (1/10) who presents to the ED via EMS for CC of hypotension. Initial call to EMS for dizziness with standing, was orthostatic positive for EMS. Patient has no acute neurologic deficits. He denies any chest pain, SOB, dizziness at rest.   He was given IVF in ED. Clinical assessment by off going provider, does not believe patient warrants more fluid. Ambulated in ED with steady gait.   Plan at time of handoff is to interrogate pacemaker, if no findings of recurrent heart block, arrhythmia, or other emergent event, will plan for discharge home.   Spoke with [employer] rep who reviewed pacemaker interrogation, there are no arrhythmias, recurrence of heart block, or abnormalities since his last device check.  Patient was updated on these results, instructed to stay hydrated, fall prevention also discussed.  Reviewed his medications, there is not appear to be any new medications that could be causing his postural hypotension orthostasis, the tamsulosin that he has in his med list he has been on for many years, do not believe this to be the etiology of his dizziness.  Again he has no focal deficits concerning for intracranial abnormality or acute CVA.      Camila Li, MD 10/08/20 [id number]    Margette Fast, MD 10/08/20  2148

## 2020-10-08 NOTE — Discharge Instructions (Addendum)
Your workup was reassuring today. Please follow up with your cardiologist regarding persistent symptoms despite pacemaker placement.

## 2020-10-11 LAB — T4: T4, Total: 6.2 ug/dL (ref 4.5–12.0)

## 2020-10-12 ENCOUNTER — Ambulatory Visit: Payer: Medicare Other

## 2020-10-24 ENCOUNTER — Other Ambulatory Visit: Payer: Self-pay

## 2020-10-24 ENCOUNTER — Ambulatory Visit (INDEPENDENT_AMBULATORY_CARE_PROVIDER_SITE_OTHER): Payer: Medicare Other | Admitting: Emergency Medicine

## 2020-10-24 DIAGNOSIS — I442 Atrioventricular block, complete: Secondary | ICD-10-CM | POA: Diagnosis not present

## 2020-10-24 LAB — CUP PACEART INCLINIC DEVICE CHECK
Battery Remaining Longevity: 175 mo
Battery Voltage: 3.23 V
Brady Statistic AP VP Percent: 0.04 %
Brady Statistic AP VS Percent: 17.39 %
Brady Statistic AS VP Percent: 0.06 %
Brady Statistic AS VS Percent: 82.5 %
Brady Statistic RA Percent Paced: 17.4 %
Brady Statistic RV Percent Paced: 0.11 %
Date Time Interrogation Session: 20220201094853
Implantable Lead Implant Date: 20220110
Implantable Lead Implant Date: 20220110
Implantable Lead Location: 753859
Implantable Lead Location: 753860
Implantable Lead Model: 5076
Implantable Lead Model: 5076
Implantable Pulse Generator Implant Date: 20220110
Lead Channel Impedance Value: 342 Ohm
Lead Channel Impedance Value: 437 Ohm
Lead Channel Impedance Value: 456 Ohm
Lead Channel Impedance Value: 551 Ohm
Lead Channel Pacing Threshold Amplitude: 1 V
Lead Channel Pacing Threshold Amplitude: 1 V
Lead Channel Pacing Threshold Pulse Width: 0.4 ms
Lead Channel Pacing Threshold Pulse Width: 0.4 ms
Lead Channel Sensing Intrinsic Amplitude: 20.375 mV
Lead Channel Sensing Intrinsic Amplitude: 4.8 mV
Lead Channel Setting Pacing Amplitude: 3.5 V
Lead Channel Setting Pacing Amplitude: 3.5 V
Lead Channel Setting Pacing Pulse Width: 0.4 ms
Lead Channel Setting Sensing Sensitivity: 1.2 mV

## 2020-10-24 NOTE — Progress Notes (Signed)
Thanks

## 2020-10-24 NOTE — Progress Notes (Signed)
Wound check appointment. Steri-strips removed. Wound without redness or edema. Incision edges approximated, wound well healed. Normal device function. Thresholds, sensing, and impedances consistent with implant measurements. Device programmed at 3.5V/auto capture programmed on for extra safety margin until 3 month visit. Histogram distribution appropriate for patient and level of activity. No mode switches or high ventricular rates noted. Patient educated about wound care, arm mobility, lifting restrictions. ROV in 3 months with Dr. Sallyanne Kuster. Next home remote 01/01/21.

## 2020-10-26 ENCOUNTER — Institutional Professional Consult (permissible substitution): Payer: Medicare Other | Admitting: Emergency Medicine

## 2020-11-03 ENCOUNTER — Ambulatory Visit (INDEPENDENT_AMBULATORY_CARE_PROVIDER_SITE_OTHER): Payer: Medicare Other | Admitting: Pulmonary Disease

## 2020-11-03 ENCOUNTER — Encounter: Payer: Self-pay | Admitting: Pulmonary Disease

## 2020-11-03 ENCOUNTER — Other Ambulatory Visit: Payer: Self-pay

## 2020-11-03 VITALS — BP 136/76 | HR 78 | Temp 98.0°F | Ht 66.0 in | Wt 146.6 lb

## 2020-11-03 DIAGNOSIS — R0681 Apnea, not elsewhere classified: Secondary | ICD-10-CM

## 2020-11-03 DIAGNOSIS — J432 Centrilobular emphysema: Secondary | ICD-10-CM | POA: Diagnosis not present

## 2020-11-03 MED ORDER — SPIRIVA RESPIMAT 1.25 MCG/ACT IN AERS: 2.0000 | INHALATION_SPRAY | Freq: Every day | RESPIRATORY_TRACT | 6 refills | Status: DC

## 2020-11-03 MED ORDER — SPIRIVA RESPIMAT 1.25 MCG/ACT IN AERS: 2.0000 | INHALATION_SPRAY | Freq: Every day | RESPIRATORY_TRACT | 0 refills | Status: DC

## 2020-11-03 NOTE — Patient Instructions (Addendum)
We will schedule you for a sleep study at [facility]  We will have you follow up in 2 months with pulmonary function testing at that time.  Start Spiriva respimat 2 puffs once daily

## 2020-11-03 NOTE — Progress Notes (Signed)
Synopsis: Referred in January 2022 by Oscar Abrahams, NP for centrilobular emphysema  Subjective:   PATIENT ID: [name] GENDER: male DOB: May 06, 1931, MRN: [id number]   HPI  Chief Complaint  Patient presents with  . Consult    Referred by PCP for emphysema.    Oscar [name] is an 87 year old male, former smoker with coronary artery disease, CVA 02/2020 and complete heart block s/p pacemaker 10/02/20 who is referred to pulmonary clinic for apneic events.   He reports since having his stroke last year he has noticed having episodes of feeling anxious inside along with dyspnea. These events can happen at rest or after exertion. He reports his wife has noticed him gasping for air while he sleeps at night and he also reports episodes where he wakes up gasping for air when he falls asleep sitting up watching tv.   CT Chest 09/28/20 shows moderate emphysematous changes, per report. I am not able to view these images.   He smoked for 25 years and quit in 1985. Worked as an Chief Financial Officer for [employer].   Past Medical History:  Diagnosis Date  . Bladder tumor   . Cervical stenosis of spine   . E. coli infection   . Essential hypertension 03/12/2020  . GERD (gastroesophageal reflux disease)   . History of appendectomy   . History of concussion    X2   NO RESIDUALS  . History of peptic ulcer 1970'S  . Hyperlipidemia   . Irregular heart beat   . LBBB (left bundle branch block)   . Status post placement of implantable loop recorder 11/08/2014     Family History  Problem Relation Age of Onset  . Heart failure Mother   . Stroke Father   . Diabetes Brother   . Heart failure Brother      Social History   Socioeconomic History  . Marital status: Married    Spouse name: Not on file  . Number of children: Not on file  . Years of education: Not on file  . Highest education level: Not on file  Occupational History  . Occupation: retired  Tobacco Use  . Smoking status: Former  Smoker    Packs/day: 0.50    Years: 30.00    Pack years: 15.00    Types: Cigarettes    Quit date: 02/06/1979    Years since quitting: 41.7  . Smokeless tobacco: Never Used  Vaping Use  . Vaping Use: Never used  Substance and Sexual Activity  . Alcohol use: Yes    Alcohol/week: 0.0 standard drinks    Comment: 1 glass of beer once or twice a month  . Drug use: No  . Sexual activity: Not on file  Other Topics Concern  . Not on file  Social History Narrative  . Not on file   Social Determinants of Health   Financial Resource Strain: Not on file  Food Insecurity: Not on file  Transportation Needs: No Transportation Needs  . Lack of Transportation (Medical): No  . Lack of Transportation (Non-Medical): No  Physical Activity: Not on file  Stress: Not on file  Social Connections: Not on file  Intimate Partner Violence: Not on file     No Known Allergies   Outpatient Medications Prior to Visit  Medication Sig Dispense Refill  . atorvastatin (LIPITOR) 40 MG tablet Take 1 tablet (40 mg total) by mouth daily. 30 tablet 1  . b complex vitamins capsule Take 1 capsule by mouth daily.    Marland Kitchen  clopidogrel (PLAVIX) 75 MG tablet Take 1 tablet (75 mg total) by mouth daily. 90 tablet 3  . furosemide (LASIX) 20 MG tablet Take 20 mg (1 tablet) on Tuesday, Thursday and Saturdays. (Patient taking differently: Take 20 mg by mouth See admin instructions. Take 20 mg (1 tablet) on Tuesday, Thursday and Saturdays. morning) 30 tablet 3  . gabapentin (NEURONTIN) 100 MG capsule Take 1 capsule (100 mg total) by mouth 3 (three) times daily as needed. (Patient taking differently: Take 100 mg by mouth at bedtime.) 90 capsule 1  . Glucosamine-Chondroit-Vit C-Mn (GLUCOSAMINE 1500 COMPLEX PO) Take 1,500 mg by mouth daily.    Marland Kitchen loratadine (CLARITIN) 10 MG tablet Take 10 mg by mouth daily.    . Multiple Vitamins-Minerals (MULTIVITAMIN WITH MINERALS) tablet Take 1 tablet by mouth daily. Dr, Fredirick Lathe    . Olopatadine  HCl 0.2 % SOLN Place 1 drop into both eyes as needed (Dry eye).     Marland Kitchen omeprazole (PRILOSEC) 20 MG capsule Take 20 mg by mouth every morning.    . Potassium 99 MG TABS Take 99 mg by mouth daily.    . sertraline (ZOLOFT) 50 MG tablet Take 50 mg by mouth daily.    . tamsulosin (FLOMAX) 0.4 MG CAPS capsule Take 0.4 mg by mouth daily.     Facility-Administered Medications Prior to Visit  Medication Dose Route Frequency Provider Last Rate Last Admin  . lidocaine-EPINEPHrine (XYLOCAINE W/EPI) 1 %-1:100000 (with pres) injection 10 mL  10 mL Infiltration Once Croitoru, Mihai, MD        Review of Systems  Constitutional: Negative for chills, fever, malaise/fatigue and weight loss.  HENT: Negative for congestion, sinus pain and sore throat.   Eyes: Negative.   Respiratory: Negative for cough, hemoptysis, sputum production, shortness of breath and wheezing.   Cardiovascular: Negative for chest pain, palpitations, orthopnea, claudication and leg swelling.  Gastrointestinal: Negative for abdominal pain, heartburn, nausea and vomiting.  Genitourinary: Negative.   Musculoskeletal: Negative for joint pain and myalgias.  Skin: Negative for rash.  Neurological: Negative for weakness.  Endo/Heme/Allergies: Negative.   Psychiatric/Behavioral: Negative.     Objective:   Vitals:   11/03/20 1401  BP: 136/76  Pulse: 78  Temp: 98 F (36.7 C)  TempSrc: Temporal  SpO2: 98%  Weight: 146 lb 9.6 oz (66.5 kg)  Height: 5\' 6"  (1.676 m)     Physical Exam Constitutional:      General: He is not in acute distress. HENT:     Head: Normocephalic and atraumatic.  Eyes:     Extraocular Movements: Extraocular movements intact.     Conjunctiva/sclera: Conjunctivae normal.     Pupils: Pupils are equal, round, and reactive to light.  Cardiovascular:     Rate and Rhythm: Normal rate and regular rhythm.     Pulses: Normal pulses.     Heart sounds: Normal heart sounds. No murmur heard.   Abdominal:      General: Bowel sounds are normal.     Palpations: Abdomen is soft.  Musculoskeletal:     Right lower leg: No edema.     Left lower leg: No edema.  Lymphadenopathy:     Cervical: No cervical adenopathy.  Skin:    General: Skin is warm and dry.  Neurological:     General: No focal deficit present.     Mental Status: He is alert.  Psychiatric:        Mood and Affect: Mood normal.  Behavior: Behavior normal.        Thought Content: Thought content normal.        Judgment: Judgment normal.     CBC    Component Value Date/Time   WBC 7.8 10/08/2020 1157   RBC 4.25 10/08/2020 1157   HGB 13.2 10/08/2020 1157   HGB 13.2 09/28/2020 1222   HCT 40.9 10/08/2020 1157   HCT 39.8 09/28/2020 1222   PLT 140 (L) 10/08/2020 1157   PLT 234 09/28/2020 1222   MCV 96.2 10/08/2020 1157   MCV 94 09/28/2020 1222   MCH 31.1 10/08/2020 1157   MCHC 32.3 10/08/2020 1157   RDW 13.0 10/08/2020 1157   RDW 12.8 09/28/2020 1222   LYMPHSABS 0.6 (L) 10/08/2020 1157   MONOABS 0.8 10/08/2020 1157   EOSABS 0.2 10/08/2020 1157   BASOSABS 0.0 10/08/2020 1157   BMP Latest Ref Rng & Units 10/08/2020 09/28/2020 05/15/2020  Glucose 70 - 99 mg/dL 106(H) 87 102(H)  BUN 8 - 23 mg/dL 19 17 16   Creatinine 0.61 - 1.24 mg/dL 1.08 1.01 1.03  BUN/Creat Ratio 10 - 24 - 17 16  Sodium 135 - 145 mmol/L 136 138 140  Potassium 3.5 - 5.1 mmol/L 4.4 4.1 4.3  Chloride 98 - 111 mmol/L 105 99 105  CO2 22 - 32 mmol/L 23 22 21   Calcium 8.9 - 10.3 mg/dL 9.0 9.1 9.4   Chest imaging: CXR 10/08/2020 The heart size and mediastinal contours are within normal limits. Both lungs are clear. No pneumothorax or pleural effusion is noted. Left-sided pacemaker is unchanged in position. The visualized skeletal structures are unremarkable.  PFT: No flowsheet data found.  Echo 03/12/20: 1. Left ventricular ejection fraction, by estimation, is 55 to 60%. The  left ventricle has normal function. The left ventricle has no regional  wall  motion abnormalities. There is moderate concentric left ventricular  hypertrophy. Left ventricular  diastolic parameters are consistent with Grade I diastolic dysfunction  (impaired relaxation). Elevated left atrial pressure.  2. Right ventricular systolic function is normal. The right ventricular  size is normal. There is normal pulmonary artery systolic pressure.  3. The mitral valve is normal in structure. Mild mitral valve  regurgitation. No evidence of mitral stenosis.  4. The aortic valve is normal in structure. Aortic valve regurgitation is  not visualized. Mild to moderate aortic valve sclerosis/calcification is  present, without any evidence of aortic stenosis.  5. Aortic dilatation noted. There is mild dilatation of the ascending  aorta measuring 41 mm.  6. The inferior vena cava is normal in size with greater than 50%  respiratory variability, suggesting right atrial pressure of 3 mmHg.  Heart Catheterization 07/13/2019:  Prox RCA lesion is 10% stenosed.  Prox Cx to Mid Cx lesion is 20% stenosed.  Mid Cx lesion is 30% stenosed.  1st Diag lesion is 20% stenosed.  The left ventricular systolic function is normal.  LV end diastolic pressure is normal.  The left ventricular ejection fraction is 50-55% by visual estimate.  There is trivial (1+) mitral regurgitation.    Assessment & Plan:   Centrilobular emphysema ([place]) - Plan: Polysomnography 4 or more parameters (NPSG), Pulmonary Function Test  Apnea - Plan: Polysomnography 4 or more parameters (NPSG)  Discussion: Oscar [name] is an 87 year old male, former smoker with coronary artery disease, CVA 02/2020 and complete heart block s/p pacemaker 10/02/20 who is referred to pulmonary clinic for apneic events.  Uncertain whether these breathing events are related to sleep apnea  with his underlying cardiac or lung disease. We will schedule him for an in lab sleep study with concern for possible central sleep events  after his recent CVA.   Will start him on spiriva respiramat and monitor for any improvement in his symptoms.   Will schedule patient for pulmonary function tests in 2 months with his follow up.  Freda Jackson, MD [facility] Office: [phone]   See Amion for Pager Details    Current Outpatient Medications:  .  atorvastatin (LIPITOR) 40 MG tablet, Take 1 tablet (40 mg total) by mouth daily., Disp: 30 tablet, Rfl: 1 .  b complex vitamins capsule, Take 1 capsule by mouth daily., Disp: , Rfl:  .  clopidogrel (PLAVIX) 75 MG tablet, Take 1 tablet (75 mg total) by mouth daily., Disp: 90 tablet, Rfl: 3 .  furosemide (LASIX) 20 MG tablet, Take 20 mg (1 tablet) on Tuesday, Thursday and Saturdays. (Patient taking differently: Take 20 mg by mouth See admin instructions. Take 20 mg (1 tablet) on Tuesday, Thursday and Saturdays. morning), Disp: 30 tablet, Rfl: 3 .  gabapentin (NEURONTIN) 100 MG capsule, Take 1 capsule (100 mg total) by mouth 3 (three) times daily as needed. (Patient taking differently: Take 100 mg by mouth at bedtime.), Disp: 90 capsule, Rfl: 1 .  Glucosamine-Chondroit-Vit C-Mn (GLUCOSAMINE 1500 COMPLEX PO), Take 1,500 mg by mouth daily., Disp: , Rfl:  .  loratadine (CLARITIN) 10 MG tablet, Take 10 mg by mouth daily., Disp: , Rfl:  .  Multiple Vitamins-Minerals (MULTIVITAMIN WITH MINERALS) tablet, Take 1 tablet by mouth daily. Dr, Fredirick Lathe, Disp: , Rfl:  .  Olopatadine HCl 0.2 % SOLN, Place 1 drop into both eyes as needed (Dry eye). , Disp: , Rfl:  .  omeprazole (PRILOSEC) 20 MG capsule, Take 20 mg by mouth every morning., Disp: , Rfl:  .  Potassium 99 MG TABS, Take 99 mg by mouth daily., Disp: , Rfl:  .  sertraline (ZOLOFT) 50 MG tablet, Take 50 mg by mouth daily., Disp: , Rfl:  .  tamsulosin (FLOMAX) 0.4 MG CAPS capsule, Take 0.4 mg by mouth daily., Disp: , Rfl:   Current Facility-Administered Medications:  .  lidocaine-EPINEPHrine (XYLOCAINE W/EPI) 1  %-1:100000 (with pres) injection 10 mL, 10 mL, Infiltration, Once, Croitoru, Mihai, MD

## 2020-11-13 ENCOUNTER — Other Ambulatory Visit: Payer: Self-pay

## 2020-11-13 DIAGNOSIS — Z87820 Personal history of traumatic brain injury: Secondary | ICD-10-CM | POA: Insufficient documentation

## 2020-11-13 DIAGNOSIS — Z9049 Acquired absence of other specified parts of digestive tract: Secondary | ICD-10-CM | POA: Insufficient documentation

## 2020-11-13 DIAGNOSIS — E871 Hypo-osmolality and hyponatremia: Secondary | ICD-10-CM

## 2020-11-13 DIAGNOSIS — A498 Other bacterial infections of unspecified site: Secondary | ICD-10-CM | POA: Insufficient documentation

## 2020-11-13 DIAGNOSIS — K219 Gastro-esophageal reflux disease without esophagitis: Secondary | ICD-10-CM | POA: Insufficient documentation

## 2020-11-13 DIAGNOSIS — M4802 Spinal stenosis, cervical region: Secondary | ICD-10-CM | POA: Insufficient documentation

## 2020-11-13 DIAGNOSIS — I447 Left bundle-branch block, unspecified: Secondary | ICD-10-CM | POA: Insufficient documentation

## 2020-11-13 DIAGNOSIS — Z8711 Personal history of peptic ulcer disease: Secondary | ICD-10-CM | POA: Insufficient documentation

## 2020-11-13 DIAGNOSIS — D494 Neoplasm of unspecified behavior of bladder: Secondary | ICD-10-CM | POA: Insufficient documentation

## 2020-11-13 DIAGNOSIS — I499 Cardiac arrhythmia, unspecified: Secondary | ICD-10-CM | POA: Insufficient documentation

## 2020-11-13 HISTORY — DX: Hypo-osmolality and hyponatremia: E87.1

## 2020-11-15 ENCOUNTER — Encounter: Payer: Self-pay | Admitting: Cardiology

## 2020-11-15 ENCOUNTER — Ambulatory Visit (INDEPENDENT_AMBULATORY_CARE_PROVIDER_SITE_OTHER): Payer: Medicare Other | Admitting: Cardiology

## 2020-11-15 ENCOUNTER — Other Ambulatory Visit: Payer: Self-pay

## 2020-11-15 VITALS — BP 126/60 | HR 78 | Ht 67.0 in | Wt 144.0 lb

## 2020-11-15 DIAGNOSIS — E782 Mixed hyperlipidemia: Secondary | ICD-10-CM | POA: Diagnosis not present

## 2020-11-15 DIAGNOSIS — I471 Supraventricular tachycardia: Secondary | ICD-10-CM

## 2020-11-15 DIAGNOSIS — Z950 Presence of cardiac pacemaker: Secondary | ICD-10-CM | POA: Insufficient documentation

## 2020-11-15 DIAGNOSIS — I10 Essential (primary) hypertension: Secondary | ICD-10-CM

## 2020-11-15 DIAGNOSIS — I2510 Atherosclerotic heart disease of native coronary artery without angina pectoris: Secondary | ICD-10-CM | POA: Diagnosis not present

## 2020-11-15 HISTORY — DX: Presence of cardiac pacemaker: Z95.0

## 2020-11-15 NOTE — Patient Instructions (Signed)
Medication Instructions:  No medication changes. *If you need a refill on your cardiac medications before your next appointment, please call your pharmacy*   Lab Work: Your physician recommends that you have a BMET done today in the office.  If you have labs (blood work) drawn today and your tests are completely normal, you will receive your results only by: MyChart Message (if you have MyChart) OR A paper copy in the mail If you have any lab test that is abnormal or we need to change your treatment, we will call you to review the results.   Testing/Procedures: None ordered   Follow-Up: At [facility], you and your health needs are our priority.  As part of our continuing mission to provide you with exceptional heart care, we have created designated Provider Care Teams.  These Care Teams include your primary Cardiologist (physician) and Advanced Practice Providers (APPs -  Physician Assistants and Nurse Practitioners) who all work together to provide you with the care you need, when you need it.  We recommend signing up for the patient portal called "MyChart".  Sign up information is provided on this After Visit Summary.  MyChart is used to connect with patients for Virtual Visits (Telemedicine).  Patients are able to view lab/test results, encounter notes, upcoming appointments, etc.  Non-urgent messages can be sent to your provider as well.   To learn more about what you can do with MyChart, go to [email or web address].    Your next appointment:   1 month(s)  The format for your next appointment:   In Person  Provider:   Rajan Revankar, MD   Other Instructions NA  

## 2020-11-15 NOTE — Progress Notes (Signed)
Cardiology Office Note:    Date:  11/15/2020   ID:  [name], DOB [birth year], MRN [id number]  PCP:  Berkley Harvey, NP  Cardiologist:  Jenean Lindau, MD   Referring MD: Berkley Harvey, NP    ASSESSMENT:    1. Essential hypertension   2. Atrial tachycardia, paroxysmal ([place])   3. Coronary artery disease involving native coronary artery of native heart without angina pectoris   4. Mixed hyperlipidemia   5. Presence of permanent cardiac pacemaker    PLAN:    In order of problems listed above:  1. Coronary artery disease: Secondary prevention stressed with the patient.  Importance of compliance with diet medication stressed any vocalized understanding.  He was advised to walk at least half an hour a day 5 days a week and he promises to do so. 2. Essential hypertension: Patient is currently not on any medications for this.  He has achieved good blood pressure with lifestyle modification.  His symptoms suggest mild orthostasis and therefore I asked him to increase salt and fluid in the diet.  We will do a Chem-7 and get him back in a month to follow-up on this issue. 3. Mixed dyslipidemia: Lipids were reviewed from last evaluation and they are fine.  I discussed this with him at length. 4. Patient will be seen in follow-up appointment in 6 months or earlier if the patient has any concerns    Medication Adjustments/Labs and Tests Ordered: Current medicines are reviewed at length with the patient today.  Concerns regarding medicines are outlined above.  Orders Placed This Encounter  Procedures  . Basic metabolic panel   No orders of the defined types were placed in this encounter.    No chief complaint on file.    History of Present Illness:    Oscar [name] is a 87 y.o. male.  Patient has past medical history of coronary artery disease and mixed dyslipidemia.  He has permanent pacemaker insertion recently.  He denies any problems at this time and takes care of  activities of daily living.  No chest pain orthopnea or PND.  At the time of my evaluation, the patient is alert awake oriented and in no distress.  Past Medical History:  Diagnosis Date  . Abnormal nuclear stress test   . Abnormal results of thyroid function studies 01/19/2014   Formatting of this note might be different from the original. August 2016  TSH 4.01  . Acute CVA (cerebrovascular accident) ([place]) 03/12/2020  . Adult body mass index 26.0-26.9 06/07/2013  . Atrial tachycardia ([place]) 01/24/2020  . Atrial tachycardia, paroxysmal ([place]) 08/05/2019  . Bladder tumor   . Bunion of great toe of right foot 09/18/2016  . Cancer of bladder ([place]) 02/10/2013  . Cervical myelopathy ([place]) 01/27/2018  . Cervical stenosis of spine   . CHB (complete heart block) ([place]) 09/28/2020  . Coronary artery disease involving native coronary artery of native heart without angina pectoris 08/05/2019  . CVA (cerebral vascular accident) ([place]) 03/12/2020  . E. coli infection   . Encounter for general adult medical examination without abnormal findings 05/20/2016  . Essential hypertension 03/12/2020  . Exertional dyspnea 01/18/2020  . Gastro-esophageal reflux disease with esophagitis 09/28/2010  . GERD (gastroesophageal reflux disease)   . History of appendectomy   . History of bladder cancer 03/14/2014  . History of concussion    X2   NO RESIDUALS  . History of peptic ulcer 1970'S  . Hospital discharge follow-up 03/28/2020  .  Hyperlipidemia   . Hyponatremia 11/13/2020  . Hypothyroidism (acquired) 03/12/2020  . Hypothyroidism due to Hashimoto's thyroiditis 07/23/2019  . Irregular heart beat   . LBBB (left bundle branch block)   . Mixed hyperlipidemia 11/24/2013   Formatting of this note might be different from the original. History of TIA.  The goal is to have your total cholesterol < 200, the HDL (good cholesterol) >40, and the LDL (bad cholesterol) <100. It is recomended that you follow a good low fat diet and exercise for  30 minutes 3-4 times a week.  . Near syncope 05/16/2014  . Personal history of tobacco use, presenting hazards to health 01/12/2014  . S/P bilateral cataract extraction 09/18/2016  . Serum reaction due to vaccination 09/27/2011   Formatting of this note might be different from the original. IMPRESSION: seen on Wednesday for reaction to right arm after pneumovax, had redness, warmth , tenderness and edema of right upper arm which is now resolved. monitor area. use warm compresses prn. call prn. discussed will not need another pneumovax, he will consider zostavax in future.  . 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 Status post placement of implantable loop recorder 11/08/2014  . TIA (transient ischemic attack) 04/09/2014  . Transient cerebral ischemia 01/12/2014   Formatting of this note might be different from the original. IMPRESSION: 8 2015 admitted for TIA -negative CT and MRI.  Echocardiogram was also completed which was normal.  Carotid Dopplers negative for significant stenosis.  Sleep study was normal.    Past Surgical History:  Procedure Laterality Date  . ANTERIOR CERVICAL DECOMP/DISCECTOMY FUSION N/A 01/27/2018   Procedure: Anterior Cervical Discectomy Fusion - Cervical Four- Cervical Five;  Surgeon: Earnie Larsson, MD;  Location: [facility];  Service: Neurosurgery;  Laterality: N/A;  Anterior Cervical Discectomy Fusion - Cervical Four- Cervical Five  . APPENDECTOMY  1970  . CARDIOVASCULAR STRESS TEST  03/22/09  . CYSTOSCOPY WITH BIOPSY N/A 02/10/2013   Procedure: CYSTOSCOPY WITH COLD CUP BIOPSY/FULGERATION;  Surgeon: Bernestine Amass, MD;  Location: [facility];  Service: Urology;  Laterality: N/A;  ALSO FULGERATION   . INGUINAL HERNIA REPAIR Right 07-14-2002  . LEFT HEART CATH AND CORONARY ANGIOGRAPHY N/A 07/13/2019   Procedure: LEFT HEART CATH AND CORONARY ANGIOGRAPHY;  Surgeon: Troy Sine, MD;  Location: [facility];  Service: Cardiovascular;   Laterality: N/A;  . LOOP RECORDER IMPLANT N/A 05/24/2014   Procedure: LOOP RECORDER IMPLANT;  Surgeon: Sanda Klein, MD;  Location: [facility];  Service: Cardiovascular;  Laterality: N/A;  . LOOP RECORDER REMOVAL N/A 10/02/2020   Procedure: LOOP RECORDER REMOVAL;  Surgeon: Sanda Klein, MD;  Location: [place] [facility];  Service: Cardiovascular;  Laterality: N/A;  . NECK SURGERY    . PACEMAKER IMPLANT N/A 10/02/2020   Procedure: PACEMAKER IMPLANT;  Surgeon: Sanda Klein, MD;  Location: [facility];  Service: Cardiovascular;  Laterality: N/A;  . TONSILLECTOMY    . TRANSURETHRAL RESECTION OF BLADDER TUMOR  03-10-2008    Current Medications: Current Meds  Medication Sig  . atorvastatin (LIPITOR) 40 MG tablet Take 1 tablet (40 mg total) by mouth daily.  . clopidogrel (PLAVIX) 75 MG tablet Take 1 tablet (75 mg total) by mouth daily.  . furosemide (LASIX) 20 MG tablet Take 20 mg (1 tablet) on Tuesday, Thursday and Saturdays.  Marland Kitchen gabapentin (NEURONTIN) 100 MG capsule Take 100 mg by mouth at bedtime.  . Glucosamine-Chondroit-Vit C-Mn (GLUCOSAMINE 1500 COMPLEX PO) Take 1,500 mg by  mouth daily.  Marland Kitchen loratadine (CLARITIN) 10 MG tablet Take 10 mg by mouth daily.  . Olopatadine HCl 0.2 % SOLN Place 1 drop into both eyes as needed (Dry eye).   Marland Kitchen omeprazole (PRILOSEC) 20 MG capsule Take 20 mg by mouth every morning.  . sertraline (ZOLOFT) 50 MG tablet Take 50 mg by mouth daily.  . tamsulosin (FLOMAX) 0.4 MG CAPS capsule Take 0.4 mg by mouth daily.   Current Facility-Administered Medications for the 11/15/20 encounter (Office Visit) with Neilani Duffee, Reita Cliche, MD  Medication  . lidocaine-EPINEPHrine (XYLOCAINE W/EPI) 1 %-1:100000 (with pres) injection 10 mL     Allergies:   Patient has no known allergies.   Social History   Socioeconomic History  . Marital status: Married    Spouse name: Not on file  . Number of children: Not on file  . Years of education: Not on file  . Highest  education level: Not on file  Occupational History  . Occupation: retired  Tobacco Use  . Smoking status: Former Smoker    Packs/day: 0.50    Years: 30.00    Pack years: 15.00    Types: Cigarettes    Quit date: 02/06/1979    Years since quitting: 41.8  . Smokeless tobacco: Never Used  Vaping Use  . Vaping Use: Never used  Substance and Sexual Activity  . Alcohol use: Yes    Alcohol/week: 0.0 standard drinks    Comment: 1 glass of beer once or twice a month  . Drug use: No  . Sexual activity: Not on file  Other Topics Concern  . Not on file  Social History Narrative  . Not on file   Social Determinants of Health   Financial Resource Strain: Not on file  Food Insecurity: Not on file  Transportation Needs: No Transportation Needs  . Lack of Transportation (Medical): No  . Lack of Transportation (Non-Medical): No  Physical Activity: Not on file  Stress: Not on file  Social Connections: Not on file     Family History: The patient's family history includes Diabetes in his brother; Heart failure in his brother and mother; Stroke in his father.  ROS:   Please see the history of present illness.    All other systems reviewed and are negative.  EKGs/Labs/Other Studies Reviewed:    The following studies were reviewed today: I discussed my findings with the patient at length.   Recent Labs: 10/08/2020: ALT 19; BUN 19; Creatinine, Ser 1.08; Hemoglobin 13.2; Platelets 140; Potassium 4.4; Sodium 136; TSH 2.877  Recent Lipid Panel    Component Value Date/Time   CHOL 150 05/15/2020 0843   TRIG 73 05/15/2020 0843   HDL 58 05/15/2020 0843   CHOLHDL 2.6 05/15/2020 0843   CHOLHDL 3.9 03/13/2020 0500   VLDL 19 03/13/2020 0500   LDLCALC 78 05/15/2020 0843    Physical Exam:    VS:  BP 126/60   Pulse 78   Ht 5\' 7"  (1.702 m)   Wt 144 lb (65.3 kg)   SpO2 99%   BMI 22.55 kg/m     Wt Readings from Last 3 Encounters:  11/15/20 144 lb (65.3 kg)  11/03/20 146 lb 9.6 oz (66.5  kg)  10/02/20 145 lb (65.8 kg)     GEN: Patient is in no acute distress HEENT: Normal NECK: No JVD; No carotid bruits LYMPHATICS: No lymphadenopathy CARDIAC: Hear sounds regular, 2/6 systolic murmur at the apex. RESPIRATORY:  Clear to auscultation without rales, wheezing or rhonchi  ABDOMEN: Soft, non-tender, non-distended MUSCULOSKELETAL:  No edema; No deformity  SKIN: Warm and dry NEUROLOGIC:  Alert and oriented x 3 PSYCHIATRIC:  Normal affect   Signed, Jenean Lindau, MD  11/15/2020 9:55 AM    [place]

## 2020-11-16 LAB — BASIC METABOLIC PANEL
BUN/Creatinine Ratio: 16 (ref 10–24)
BUN: 16 mg/dL (ref 8–27)
CO2: 21 mmol/L (ref 20–29)
Calcium: 9.8 mg/dL (ref 8.6–10.2)
Chloride: 100 mmol/L (ref 96–106)
Creatinine, Ser: 0.99 mg/dL (ref 0.76–1.27)
GFR calc Af Amer: 78 mL/min/{1.73_m2} (ref >59)
GFR calc non Af Amer: 68 mL/min/{1.73_m2} (ref >59)
Glucose: 80 mg/dL (ref 65–99)
Potassium: 4.7 mmol/L (ref 3.5–5.2)
Sodium: 137 mmol/L (ref 134–144)

## 2020-11-17 ENCOUNTER — Telehealth: Payer: Self-pay | Admitting: Cardiovascular Disease

## 2020-11-17 NOTE — Telephone Encounter (Signed)
Spoke to patient he stated he does not feel good.He feels dizzy,weak.He has not blacked out.Stated he had a pacemaker implant 1 month ago.He cannot tell it has helped.He saw Dr.Revankar 2/23 and he told him to drink gatorade.He has not drank gatorade yet.He wanted Dr.Croitoru"s advice.Advised Dr.Croitoru is out of office.Advised to drink gatorade.I will send message to device clinic to check pacemaker.I will also send message to Dr.Croitoru and his RN.

## 2020-11-17 NOTE — Telephone Encounter (Signed)
STAT if patient feels like he/she is going to faint   1) Are you dizzy now?  Yes- it been going on for a while  2) Do you feel faint or have you passed out? no  3) Do you have any other symptoms?  weak  4) Have you checked your HR and BP (record if available)?  normal

## 2020-11-17 NOTE — Telephone Encounter (Signed)
Patient reports he has had issues with intermittent dizziness x 1 month. He reports no CP, SOB, chest tightness, or syncope.  Remote transmission sent that showed device function WNL, Presenting EGM was SR 75, no episodes recorded since episode of ST that lasted < 1 second on 10/31/20. Patient advised to follow-up with PCP and reports that he has an appointment today with Dr Ronnald Ramp. ED precautions given.

## 2020-11-25 ENCOUNTER — Other Ambulatory Visit: Payer: Self-pay | Admitting: Cardiology

## 2020-11-26 DIAGNOSIS — J432 Centrilobular emphysema: Secondary | ICD-10-CM

## 2020-11-26 HISTORY — DX: Centrilobular emphysema: J43.2

## 2020-11-27 ENCOUNTER — Other Ambulatory Visit: Payer: Self-pay | Admitting: Cardiology

## 2020-11-27 MED ORDER — ATORVASTATIN CALCIUM 40 MG PO TABS: 40.0000 mg | ORAL_TABLET | Freq: Every day | ORAL | 3 refills | Status: DC

## 2020-11-27 MED ORDER — FUROSEMIDE 20 MG PO TABS: 20.0000 mg | ORAL_TABLET | ORAL | 3 refills | Status: DC

## 2020-11-27 NOTE — Telephone Encounter (Signed)
Rx refill sent to pharmacy. 

## 2020-11-29 DIAGNOSIS — R0681 Apnea, not elsewhere classified: Secondary | ICD-10-CM

## 2020-11-29 HISTORY — DX: Apnea, not elsewhere classified: R06.81

## 2020-12-05 ENCOUNTER — Other Ambulatory Visit: Payer: Self-pay

## 2020-12-07 ENCOUNTER — Other Ambulatory Visit: Payer: Self-pay

## 2020-12-07 ENCOUNTER — Encounter: Payer: Self-pay | Admitting: Cardiology

## 2020-12-07 ENCOUNTER — Ambulatory Visit (INDEPENDENT_AMBULATORY_CARE_PROVIDER_SITE_OTHER): Payer: Medicare Other | Admitting: Cardiology

## 2020-12-07 VITALS — BP 128/80 | HR 74 | Ht 67.0 in | Wt 142.0 lb

## 2020-12-07 DIAGNOSIS — I77810 Thoracic aortic ectasia: Secondary | ICD-10-CM

## 2020-12-07 DIAGNOSIS — I10 Essential (primary) hypertension: Secondary | ICD-10-CM

## 2020-12-07 DIAGNOSIS — I471 Supraventricular tachycardia: Secondary | ICD-10-CM

## 2020-12-07 DIAGNOSIS — E782 Mixed hyperlipidemia: Secondary | ICD-10-CM

## 2020-12-07 DIAGNOSIS — I2510 Atherosclerotic heart disease of native coronary artery without angina pectoris: Secondary | ICD-10-CM

## 2020-12-07 DIAGNOSIS — Z950 Presence of cardiac pacemaker: Secondary | ICD-10-CM

## 2020-12-07 HISTORY — DX: Thoracic aortic ectasia: I77.810

## 2020-12-07 NOTE — Patient Instructions (Addendum)
Medication Instructions:  Your physician recommends that you continue on your current medications as directed. Please refer to the Current Medication list given to you today.   Lab Work: None If you have labs (blood work) drawn today and your tests are completely normal, you will receive your results only by: Marland Kitchen MyChart Message (if you have MyChart) OR . A paper copy in the mail If you have any lab test that is abnormal or we need to change your treatment, we will call you to review the results.   Testing/Procedures: Your physician has requested that you have cardiac CT. Cardiac computed tomography (CT) is a painless test that uses an x-ray machine to take clear, detailed pictures of your heart. For further information please visit [email or web address]. Please follow instruction sheet as given.  We have placed the order for you to have this Chest CT completed here at [facility]. They will call you to schedule this appointment.        After the Test: . Drink plenty of water. . After receiving IV contrast, you may experience a mild flushed feeling. This is normal. . On occasion, you may experience a mild rash up to 24 hours after the test. This is not dangerous. If this occurs, you can take Benadryl 25 mg and increase your fluid intake. . If you experience trouble breathing, this can be serious. If it is severe call 911 IMMEDIATELY. If it is mild, please call our office. . If you take any of these medications: Glipizide/Metformin, Avandament, Glucavance, please do not take 48 hours after completing test unless otherwise instructed.   Once we have confirmed authorization from your insurance company, we will call you to set up a date and time for your test. Based on how quickly your insurance processes prior authorizations requests, please allow up to 4 weeks to be contacted for scheduling your Cardiac CT appointment. Be advised that routine Cardiac CT appointments could be scheduled as  many as 8 weeks after your provider has ordered it.  For non-scheduling related questions, please contact the cardiac imaging nurse navigator should you have any questions/concerns: Marchia Bond, Cardiac Imaging Nurse Navigator Gordy Clement, Cardiac Imaging Nurse Navigator [facility] Direct Office Dial: [phone]   For scheduling needs, including cancellations and rescheduling, please call [place], [phone].      Follow-Up: At [facility], you and your health needs are our priority.  As part of our continuing mission to provide you with exceptional heart care, we have created designated Provider Care Teams.  These Care Teams include your primary Cardiologist (physician) and Advanced Practice Providers (APPs -  Physician Assistants and Nurse Practitioners) who all work together to provide you with the care you need, when you need it.  We recommend signing up for the patient portal called "MyChart".  Sign up information is provided on this After Visit Summary.  MyChart is used to connect with patients for Virtual Visits (Telemedicine).  Patients are able to view lab/test results, encounter notes, upcoming appointments, etc.  Non-urgent messages can be sent to your provider as well.   To learn more about what you can do with MyChart, go to [email or web address].    Your next appointment:   6 month(s)  The format for your next appointment:   In Person  Provider:   Jyl Heinz, MD   Other Instructions

## 2020-12-07 NOTE — Progress Notes (Signed)
Cardiology Office Note:    Date:  12/07/2020   ID:  Oscar [name], DOB 23-Nov-1930, MRN [id number]  PCP:  Berkley Harvey, NP  Cardiologist:  Jenean Lindau, MD   Referring MD: Berkley Harvey, NP    ASSESSMENT:    1. Essential hypertension   2. Atrial tachycardia, paroxysmal ([place])   3. Coronary artery disease involving native coronary artery of native heart without angina pectoris   4. Mixed hyperlipidemia   5. Presence of permanent cardiac pacemaker   6. Ascending aorta dilatation (HCC)    PLAN:    In order of problems listed above:  1. Coronary artery disease: Secondary prevention stressed with the patient.  Importance of compliance with diet medication stressed any vocalized understanding.  He is active age-appropriate. 2. Mixed dyslipidemia: His blood work is followed by primary care doctor.  He tells me that blood work done recently.  He is here to get a call from them.  Lipids were reviewed from last evaluation and discussed with him. 3. Presence of permanent pacemaker: Followed by our electrophysiology colleagues and the records were noted. 4. Paroxysmal atrial tachycardia: Stable at this time.  Asymptomatic.  Medical management. 5. Ascending aortic dilatation: This is followed by echocardiogram and aorta was 4.2 cm.  I recommend a CT scan without contrast and is agreeable. 6. Patient will be seen in follow-up appointment in 6 months or earlier if the patient has any concerns    Medication Adjustments/Labs and Tests Ordered: Current medicines are reviewed at length with the patient today.  Concerns regarding medicines are outlined above.  No orders of the defined types were placed in this encounter.  No orders of the defined types were placed in this encounter.    No chief complaint on file.    History of Present Illness:    Oscar [name] is a 87 y.o. male.  Patient has past medical history of coronary artery disease, mixed dyslipidemia, paroxysmal atrial  tachycardia and presence of permanent cardiac pacemaker.  He denies any problems at this time and takes care of activities of daily living.  No chest pain orthopnea or PND.  His blood pressure medication was discontinued because of borderline blood pressure and is fine with this.  He is an active gentleman.  At the time of my evaluation, the patient is alert awake oriented and in no distress.  Past Medical History:  Diagnosis Date  . Abnormal nuclear stress test   . Abnormal results of thyroid function studies 01/19/2014   Formatting of this note might be different from the original. August 2016  TSH 4.01  . Acute CVA (cerebrovascular accident) ([place]) 03/12/2020  . Adult body mass index 26.0-26.9 06/07/2013  . Apneic episode 11/29/2020  . Atrial tachycardia ([place]) 01/24/2020  . Atrial tachycardia, paroxysmal ([place]) 08/05/2019  . Bladder tumor   . Bunion of great toe of right foot 09/18/2016  . Cancer of bladder ([place]) 02/10/2013  . Centrilobular emphysema ([place]) 11/26/2020  . Cervical myelopathy ([place]) 01/27/2018  . Cervical stenosis of spine   . CHB (complete heart block) ([place]) 09/28/2020  . Coronary artery disease involving native coronary artery of native heart without angina pectoris 08/05/2019  . CVA (cerebral vascular accident) ([place]) 03/12/2020  . E. coli infection   . Encounter for general adult medical examination without abnormal findings 05/20/2016  . Essential hypertension 03/12/2020  . Exertional dyspnea 01/18/2020  . Gastro-esophageal reflux disease with esophagitis 09/28/2010  . GERD (gastroesophageal reflux disease)   .  History of appendectomy   . History of bladder cancer 03/14/2014  . History of concussion    X2   NO RESIDUALS  . History of peptic ulcer 1970'S  . Hospital discharge follow-up 03/28/2020  . Hyperlipidemia   . Hyponatremia 11/13/2020  . Hypothyroidism (acquired) 03/12/2020  . Hypothyroidism due to Hashimoto's thyroiditis 07/23/2019  . Irregular heart beat   . LBBB (left bundle  branch block)   . Mixed hyperlipidemia 11/24/2013   Formatting of this note might be different from the original. History of TIA.  The goal is to have your total cholesterol < 200, the HDL (good cholesterol) >40, and the LDL (bad cholesterol) <100. It is recomended that you follow a good low fat diet and exercise for 30 minutes 3-4 times a week.  . Near syncope 05/16/2014  . Personal history of tobacco use, presenting hazards to health 01/12/2014  . Presence of permanent cardiac pacemaker 11/15/2020  . S/P bilateral cataract extraction 09/18/2016  . Serum reaction due to vaccination 09/27/2011   Formatting of this note might be different from the original. IMPRESSION: seen on Wednesday for reaction to right arm after pneumovax, had redness, warmth , tenderness and edema of right upper arm which is now resolved. monitor area. use warm compresses prn. call prn. discussed will not need another pneumovax, he will consider zostavax in future.  . 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 Status post placement of implantable loop recorder 11/08/2014  . TIA (transient ischemic attack) 04/09/2014  . Transient cerebral ischemia 01/12/2014   Formatting of this note might be different from the original. IMPRESSION: 8 2015 admitted for TIA -negative CT and MRI.  Echocardiogram was also completed which was normal.  Carotid Dopplers negative for significant stenosis.  Sleep study was normal.    Past Surgical History:  Procedure Laterality Date  . ANTERIOR CERVICAL DECOMP/DISCECTOMY FUSION N/A 01/27/2018   Procedure: Anterior Cervical Discectomy Fusion - Cervical Four- Cervical Five;  Surgeon: Earnie Larsson, MD;  Location: [place];  Service: Neurosurgery;  Laterality: N/A;  Anterior Cervical Discectomy Fusion - Cervical Four- Cervical Five  . APPENDECTOMY  1970  . CARDIOVASCULAR STRESS TEST  03/22/09  . CYSTOSCOPY WITH BIOPSY N/A 02/10/2013   Procedure: CYSTOSCOPY WITH COLD CUP BIOPSY/FULGERATION;   Surgeon: Bernestine Amass, MD;  Location: [facility];  Service: Urology;  Laterality: N/A;  ALSO FULGERATION   . INGUINAL HERNIA REPAIR Right 07-14-2002  . LEFT HEART CATH AND CORONARY ANGIOGRAPHY N/A 07/13/2019   Procedure: LEFT HEART CATH AND CORONARY ANGIOGRAPHY;  Surgeon: Troy Sine, MD;  Location: [place] [facility];  Service: Cardiovascular;  Laterality: N/A;  . LOOP RECORDER IMPLANT N/A 05/24/2014   Procedure: LOOP RECORDER IMPLANT;  Surgeon: Sanda Klein, MD;  Location: [place] [facility];  Service: Cardiovascular;  Laterality: N/A;  . LOOP RECORDER REMOVAL N/A 10/02/2020   Procedure: LOOP RECORDER REMOVAL;  Surgeon: Sanda Klein, MD;  Location: [place] [facility];  Service: Cardiovascular;  Laterality: N/A;  . NECK SURGERY    . PACEMAKER IMPLANT N/A 10/02/2020   Procedure: PACEMAKER IMPLANT;  Surgeon: Sanda Klein, MD;  Location: [place] [facility];  Service: Cardiovascular;  Laterality: N/A;  . TONSILLECTOMY    . TRANSURETHRAL RESECTION OF BLADDER TUMOR  03-10-2008    Current Medications: Current Meds  Medication Sig  . atorvastatin (LIPITOR) 40 MG tablet Take 1 tablet (40 mg total) by mouth daily.  . clopidogrel (PLAVIX) 75 MG tablet Take 1 tablet (75 mg  total) by mouth daily.  . COMBIVENT RESPIMAT 20-100 MCG/ACT AERS respimat Inhale 1 puff into the lungs 2 (two) times daily.  . furosemide (LASIX) 20 MG tablet Take 1 tablet (20 mg total) by mouth 3 (three) times a week. Take 20 mg on Tuesday, Thursday and Saturday  . gabapentin (NEURONTIN) 100 MG capsule Take 100 mg by mouth at bedtime.  . Glucosamine-Chondroit-Vit C-Mn (GLUCOSAMINE 1500 COMPLEX PO) Take 1,500 mg by mouth daily.  Marland Kitchen loratadine (CLARITIN) 10 MG tablet Take 10 mg by mouth daily.  . Olopatadine HCl 0.2 % SOLN Place 1 drop into both eyes as needed (Dry eye).   Marland Kitchen omeprazole (PRILOSEC) 20 MG capsule Take 20 mg by mouth every morning.  . sertraline (ZOLOFT) 50 MG tablet Take 50 mg by mouth daily.  .  tamsulosin (FLOMAX) 0.4 MG CAPS capsule Take 0.4 mg by mouth daily.   Current Facility-Administered Medications for the 12/07/20 encounter (Office Visit) with Emme Rosenau, Reita Cliche, MD  Medication  . lidocaine-EPINEPHrine (XYLOCAINE W/EPI) 1 %-1:100000 (with pres) injection 10 mL     Allergies:   Patient has no known allergies.   Social History   Socioeconomic History  . Marital status: Married    Spouse name: Not on file  . Number of children: Not on file  . Years of education: Not on file  . Highest education level: Not on file  Occupational History  . Occupation: retired  Tobacco Use  . Smoking status: Former Smoker    Packs/day: 0.50    Years: 30.00    Pack years: 15.00    Types: Cigarettes    Quit date: 02/06/1979    Years since quitting: 41.8  . Smokeless tobacco: Never Used  Vaping Use  . Vaping Use: Never used  Substance and Sexual Activity  . Alcohol use: Yes    Alcohol/week: 0.0 standard drinks    Comment: 1 glass of beer once or twice a month  . Drug use: No  . Sexual activity: Not on file  Other Topics Concern  . Not on file  Social History Narrative  . Not on file   Social Determinants of Health   Financial Resource Strain: Not on file  Food Insecurity: Not on file  Transportation Needs: No Transportation Needs  . Lack of Transportation (Medical): No  . Lack of Transportation (Non-Medical): No  Physical Activity: Not on file  Stress: Not on file  Social Connections: Not on file     Family History: The patient's family history includes Diabetes in his brother; Heart failure in his brother and mother; Stroke in his father.  ROS:   Please see the history of present illness.    All other systems reviewed and are negative.  EKGs/Labs/Other Studies Reviewed:    The following studies were reviewed today: I discussed my findings with the patient at length.   Recent Labs: 10/08/2020: ALT 19; Hemoglobin 13.2; Platelets 140; TSH 2.877 11/15/2020: BUN 16;  Creatinine, Ser 0.99; Potassium 4.7; Sodium 137  Recent Lipid Panel    Component Value Date/Time   CHOL 150 05/15/2020 0843   TRIG 73 05/15/2020 0843   HDL 58 05/15/2020 0843   CHOLHDL 2.6 05/15/2020 0843   CHOLHDL 3.9 03/13/2020 0500   VLDL 19 03/13/2020 0500   LDLCALC 78 05/15/2020 0843    Physical Exam:    VS:  BP 128/80   Pulse 74   Ht 5\' 7"  (1.702 m)   Wt 142 lb (64.4 kg)   SpO2 96%  BMI 22.24 kg/m     Wt Readings from Last 3 Encounters:  12/07/20 142 lb (64.4 kg)  11/15/20 144 lb (65.3 kg)  11/03/20 146 lb 9.6 oz (66.5 kg)     GEN: Patient is in no acute distress HEENT: Normal NECK: No JVD; No carotid bruits LYMPHATICS: No lymphadenopathy CARDIAC: Hear sounds regular, 2/6 systolic murmur at the apex. RESPIRATORY:  Clear to auscultation without rales, wheezing or rhonchi  ABDOMEN: Soft, non-tender, non-distended MUSCULOSKELETAL:  No edema; No deformity  SKIN: Warm and dry NEUROLOGIC:  Alert and oriented x 3 PSYCHIATRIC:  Normal affect   Signed, Jenean Lindau, MD  12/07/2020 11:01 AM    [place]

## 2020-12-12 ENCOUNTER — Other Ambulatory Visit: Payer: Self-pay | Admitting: Urology

## 2020-12-12 ENCOUNTER — Telehealth: Payer: Self-pay | Admitting: Cardiology

## 2020-12-12 NOTE — Telephone Encounter (Signed)
   Primary Cardiologist: Jenean Lindau, MD  Chart reviewed as part of pre-operative protocol coverage. Given past medical history and time since last visit, based on ACC/AHA guidelines, Oscar [name] would be at acceptable risk for the planned procedure without further cardiovascular testing.   His Plavix may be held for 5 days prior to his procedure.  Please resume as soon as hemostasis is achieved at the discretion of the surgeon.  I will route this recommendation to the requesting party via Epic fax function and remove from pre-op pool.  Please call with questions.  Jossie Ng. Cleaver NP-C    12/12/2020, 1:05 PM [facility] [address] Office [phone] Fax [phone]

## 2020-12-12 NOTE — Telephone Encounter (Signed)
Oscar [name] 87 year old male is requesting cardiac evaluation for transurethral bladder tumor resection.  He was last seen in the clinic on 12/07/2020.  He was doing well at that time and remained physically active.  Follow-up was planned for 6 months.   His PMH includes coronary artery disease (LHC 07/13/2019 showed LVEF 50-55%, and mild nonobstructive CAD.  Chronic left bundle branch block noted.  Medical management recommended.),  Mixed hyperlipidemia, PPM, paroxysmal atrial tachycardia, and ascending aortic dilation.  May his Plavix be held prior to the procedure?  Thank you for your help.  Please direct response to CV DIV preop pool.  Oscar Ng. Cleaver NP-C    12/12/2020, 11:01 AM Oscar [name] [address] Office [phone] Fax [phone]

## 2020-12-12 NOTE — Telephone Encounter (Signed)
If the patient has not had coronary intervention for the past 1 year that he can safely go off Plavix.  I reviewed the chart and it seems that the last intervention was more than a year ago.

## 2020-12-12 NOTE — Telephone Encounter (Signed)
   [facility] Pre-operative Risk Assessment    Request for surgical clearance:  1. What type of surgery is being performed? Bladder tumor removal  2. When is this surgery scheduled? 12/28/2020  3. What type of clearance is required (medical clearance vs. Pharmacy clearance to hold med vs. Both)? both  4. Are there any medications that need to be held prior to surgery and how long? clopidogrel (PLAVIX) 75 MG tablet 5 days  5. Practice name and name of physician performing surgery? [facility]  6. What is your office phone number [phone]   7.   What is your office fax number [phone]  8.   Anesthesia type (None, local, MAC, general) ? general   Oscar [name] 12/12/2020, 10:23 AM  _________________________________________________________________   (provider comments below)

## 2020-12-15 ENCOUNTER — Ambulatory Visit (HOSPITAL_BASED_OUTPATIENT_CLINIC_OR_DEPARTMENT_OTHER)
Admission: RE | Admit: 2020-12-15 | Discharge: 2020-12-15 | Disposition: A | Discharge status: Home / Self Care | Payer: Medicare Other | Source: Ambulatory Visit | Attending: Cardiology | Admitting: Cardiology

## 2020-12-15 ENCOUNTER — Other Ambulatory Visit: Payer: Self-pay

## 2020-12-15 DIAGNOSIS — I700 Atherosclerosis of aorta: Secondary | ICD-10-CM | POA: Insufficient documentation

## 2020-12-15 DIAGNOSIS — J432 Centrilobular emphysema: Secondary | ICD-10-CM | POA: Diagnosis not present

## 2020-12-15 DIAGNOSIS — I2510 Atherosclerotic heart disease of native coronary artery without angina pectoris: Secondary | ICD-10-CM | POA: Insufficient documentation

## 2020-12-15 DIAGNOSIS — I77810 Thoracic aortic ectasia: Secondary | ICD-10-CM | POA: Insufficient documentation

## 2020-12-18 NOTE — Progress Notes (Addendum)
COVID Vaccine Completed:  x3 Date COVID Vaccine completed:  11-06-19 12-04-19 Has received booster:  09-12-20 COVID vaccine manufacturer: [employer]   Date of COVID positive in last 90 days: N/A  Cardiac clearance in Epic dated 12-12-20 by Coletta Memos, NP-C  PCP - Eldridge Abrahams, NP Cardiologist - Jyl Heinz, MD  Chest x-ray - 10-08-20 Epic EKG - 10-08-20 Epic  Stress Test - 07-06-19 Epic ECHO - 03-12-20 Epic Cardiac Cath - 07-13-19 Epic Pacemaker/ICD device last checked: 10-24-20 Epic.  Orders on chart and in Epic Spinal Cord Stimulator: Telemetry monitoring - 03-03-20 Epic   Sleep Study - 11-2020, does not know results yet CPAP - No  Fasting Blood Sugar - N/A Checks Blood Sugar _____ times a day  Blood Thinner Instructions:  Plavix 75 mg (ok to hold 5 days preop per clearance).  Pt is aware Aspirin Instructions: Last Dose:  Activity level:  Can go up a flight of stairs and perform activities of daily living without stopping and without symptoms of chest pain or shortness of breath.    Anesthesia review:  CAD, atrial tachycardia, CHB, LBBB, ascending aorta dilation.  Emphysema.  Apneic episodes, being evaluated for sleep apnea.  Hx of stroke, only deficits are short term memory issues and dizziness with standing.    Patient denies shortness of breath, fever, cough and chest pain at PAT appointment   Patient verbalized understanding of instructions that were given to them at the PAT appointment. Patient was also instructed that they will need to review over the PAT instructions again at home before surgery.

## 2020-12-19 ENCOUNTER — Encounter: Payer: Self-pay | Admitting: Cardiovascular Disease

## 2020-12-19 NOTE — Patient Instructions (Addendum)
DUE TO COVID-19 ONLY ONE VISITOR IS ALLOWED TO COME WITH YOU AND STAY IN THE WAITING ROOM ONLY DURING PRE OP AND PROCEDURE.     COVID SWAB TESTING MUST BE COMPLETED ON:  Tuesday, 12-26-20 @ 8:30 AM   [address] [place], [place] [address]  (Must self quarantine after testing. Follow instructions on handout.)   Your procedure is scheduled on:  Thursday, 12-28-20   Report to [facility] Main  Entrance    Report to admitting at 10:00 AM   Call this number if you have problems the morning of surgery [phone]   Do not eat food :After Midnight.   May have liquids until 9:00 AM  day of surgery  CLEAR LIQUID DIET  Foods Allowed                                                                     Foods Excluded  Water, Black Coffee and tea, regular and decaf              liquids that you cannot  Plain Jell-O in any flavor  (No red)                                     see through such as: Fruit ices (not with fruit pulp)                                      milk, soups, orange juice              Iced Popsicles (No red)                                      All solid food                                   Apple juices Sports drinks like Gatorade (No red) Lightly seasoned clear broth or consume(fat free) Sugar, honey syrup    Oral Hygiene is also important to reduce your risk of infection.                                    Remember - BRUSH YOUR TEETH THE MORNING OF SURGERY WITH YOUR REGULAR TOOTHPASTE   Do NOT smoke after Midnight   Take these medicines the morning of surgery with A SIP OF WATER:  Atorvastatin, Omeprazole, Sertraline, Tamsulosin                              You may not have any metal on your body including  jewelry, and body piercings             Do not wear lotions, powders, perfumes/cologne, or deodorant             Men may shave face and  neck.   Do not bring valuables to the hospital. [place].   Contacts, dentures or bridgework may not be worn into surgery.   Patients discharged the day of surgery will not be allowed to drive home.   Special Instructions: Bring a copy of your healthcare power of attorney and living will documents  the day of surgery if you haven't  scanned them in before.              Please read over the following fact sheets you were given: IF YOU HAVE QUESTIONS ABOUT YOUR PRE OP INSTRUCTIONS PLEASE CALL  [place] - Preparing for Surgery Before surgery, you can play an important role.  Because skin is not sterile, your skin needs to be as free of germs as possible.  You can reduce the number of germs on your skin by washing with CHG (chlorahexidine gluconate) soap before surgery.  CHG is an antiseptic cleaner which kills germs and bonds with the skin to continue killing germs even after washing. Please DO NOT use if you have an allergy to CHG or antibacterial soaps.  If your skin becomes reddened/irritated stop using the CHG and inform your nurse when you arrive at Short Stay. Do not shave (including legs and underarms) for at least 48 hours prior to the first CHG shower.  You may shave your face/neck.  Please follow these instructions carefully:  1.  Shower with CHG Soap the night before surgery and the  morning of surgery.  2.  If you choose to wash your hair, wash your hair first as usual with your normal  shampoo.  3.  After you shampoo, rinse your hair and body thoroughly to remove the shampoo.                             4.  Use CHG as you would any other liquid soap.  You can apply chg directly to the skin and wash.  Gently with a scrungie or clean washcloth.  5.  Apply the CHG Soap to your body ONLY FROM THE NECK DOWN.   Do   not use on face/ open                           Wound or open sores. Avoid contact with eyes, ears mouth and   genitals (private parts).                       Wash face,  Genitals (private parts)  with your normal soap.             6.  Wash thoroughly, paying special attention to the area where your    surgery  will be performed.  7.  Thoroughly rinse your body with warm water from the neck down.  8.  DO NOT shower/wash with your normal soap after using and rinsing off the CHG Soap.                9.  Pat yourself dry with a clean towel.            10.  Wear clean pajamas.            11.  Place clean sheets on your bed  the night of your first shower and do not  sleep with pets. Day of Surgery : Do not apply any lotions/deodorants the morning of surgery.  Please wear clean clothes to the hospital/surgery center.  FAILURE TO FOLLOW THESE INSTRUCTIONS MAY RESULT IN THE CANCELLATION OF YOUR SURGERY  PATIENT SIGNATURE_________________________________  NURSE SIGNATURE__________________________________  ________________________________________________________________________

## 2020-12-19 NOTE — Progress Notes (Signed)
PERIOPERATIVE PRESCRIPTION FOR IMPLANTED CARDIAC DEVICE PROGRAMMING  Patient Information: Name:  Oscar [name]  DOB:  10/02/1930  MRN:  [id number]    Blenda Peals, RN  P Cv Div Heartcare Device Planned Procedure: TURBT, bladder biopsy  Surgeon: Burman Nieves  Date of Procedure: 12-28-20  Cautery will be used.  Position during surgery: supine   Please send documentation back to:  [facility] (Fax # [phone])   Lambert Keto, RN  12/19/2020 12:41 PM   Device Information:  Clinic EP Physician:  Dr. Dani Gobble Croitoru   Device Type:  Pacemaker Manufacturer and Phone #:  [employer]: [phone] Pacemaker Dependent?:  No. Date of Last Device Check:  11/17/20(remote);  10/24/20 (in-clinic) Normal Device Function?:  Yes.    Electrophysiologist's Recommendations:   Have magnet available.  Provide continuous ECG monitoring when magnet is used or reprogramming is to be performed.   Procedure should not interfere with device function.  No device programming or magnet placement needed.  Per Device Clinic Standing Orders, York Ram, RN  4:31 PM 12/19/2020

## 2020-12-20 ENCOUNTER — Ambulatory Visit (HOSPITAL_BASED_OUTPATIENT_CLINIC_OR_DEPARTMENT_OTHER): Payer: Medicare Other | Attending: Pulmonary Disease | Admitting: Pulmonary Disease

## 2020-12-20 ENCOUNTER — Other Ambulatory Visit: Payer: Self-pay

## 2020-12-20 DIAGNOSIS — R0681 Apnea, not elsewhere classified: Secondary | ICD-10-CM | POA: Diagnosis present

## 2020-12-20 DIAGNOSIS — J432 Centrilobular emphysema: Secondary | ICD-10-CM

## 2020-12-20 DIAGNOSIS — G4733 Obstructive sleep apnea (adult) (pediatric): Secondary | ICD-10-CM | POA: Insufficient documentation

## 2020-12-22 ENCOUNTER — Other Ambulatory Visit: Payer: Self-pay

## 2020-12-22 ENCOUNTER — Encounter (HOSPITAL_COMMUNITY)
Admission: RE | Admit: 2020-12-22 | Discharge: 2020-12-22 | Disposition: A | Discharge status: Home / Self Care | Payer: Medicare Other | Source: Ambulatory Visit | Attending: Urology | Admitting: Urology

## 2020-12-22 ENCOUNTER — Encounter (HOSPITAL_COMMUNITY): Payer: Self-pay

## 2020-12-22 DIAGNOSIS — D494 Neoplasm of unspecified behavior of bladder: Secondary | ICD-10-CM | POA: Insufficient documentation

## 2020-12-22 DIAGNOSIS — I10 Essential (primary) hypertension: Secondary | ICD-10-CM | POA: Diagnosis not present

## 2020-12-22 DIAGNOSIS — I2510 Atherosclerotic heart disease of native coronary artery without angina pectoris: Secondary | ICD-10-CM | POA: Insufficient documentation

## 2020-12-22 DIAGNOSIS — Z87891 Personal history of nicotine dependence: Secondary | ICD-10-CM | POA: Diagnosis not present

## 2020-12-22 DIAGNOSIS — I447 Left bundle-branch block, unspecified: Secondary | ICD-10-CM | POA: Diagnosis not present

## 2020-12-22 DIAGNOSIS — Z7902 Long term (current) use of antithrombotics/antiplatelets: Secondary | ICD-10-CM | POA: Insufficient documentation

## 2020-12-22 DIAGNOSIS — Z01812 Encounter for preprocedural laboratory examination: Secondary | ICD-10-CM | POA: Diagnosis not present

## 2020-12-22 DIAGNOSIS — Z8673 Personal history of transient ischemic attack (TIA), and cerebral infarction without residual deficits: Secondary | ICD-10-CM | POA: Diagnosis not present

## 2020-12-22 DIAGNOSIS — Z79899 Other long term (current) drug therapy: Secondary | ICD-10-CM | POA: Diagnosis not present

## 2020-12-22 DIAGNOSIS — Z950 Presence of cardiac pacemaker: Secondary | ICD-10-CM | POA: Insufficient documentation

## 2020-12-22 LAB — CBC
HCT: 41.4 % (ref 39.0–52.0)
Hemoglobin: 13.1 g/dL (ref 13.0–17.0)
MCH: 30.5 pg (ref 26.0–34.0)
MCHC: 31.6 g/dL (ref 30.0–36.0)
MCV: 96.5 fL (ref 80.0–100.0)
Platelets: 184 10*3/uL (ref 150–400)
RBC: 4.29 MIL/uL (ref 4.22–5.81)
RDW: 12.9 % (ref 11.5–15.5)
WBC: 6.1 10*3/uL (ref 4.0–10.5)
nRBC: 0 % (ref 0.0–0.2)

## 2020-12-22 LAB — BASIC METABOLIC PANEL
Anion gap: 7 (ref 5–15)
BUN: 18 mg/dL (ref 8–23)
CO2: 26 mmol/L (ref 22–32)
Calcium: 9.4 mg/dL (ref 8.9–10.3)
Chloride: 105 mmol/L (ref 98–111)
Creatinine, Ser: 0.95 mg/dL (ref 0.61–1.24)
GFR, Estimated: >60 mL/min (ref >60)
Glucose, Bld: 83 mg/dL (ref 70–99)
Potassium: 4.5 mmol/L (ref 3.5–5.1)
Sodium: 138 mmol/L (ref 135–145)

## 2020-12-22 NOTE — Progress Notes (Signed)
Anesthesia Chart Review   Case: [id number] Date/Time: 12/28/20 1145   Procedure: TRANSURETHRAL RESECTION OF BLADDER TUMOR BLADDER BIOPSY FULGARATION BILATERAL RETROGRADE PYELOGRAM(TURBT) (Bilateral )   Anesthesia type: General   Pre-op diagnosis: bladder tumor   Location: [facility]   Surgeons: Ardis Hughs, MD      DISCUSSION:87 y.o. former smoker with h/o GERD, HTN, LBBB, CAD, CVA, pacemaker in place (device orders in progress note 12/19/2020), bladder tumor scheduled for above procedure 12/28/2020 with Dr. Louis Meckel.   Per cardiology preoperative evaluation 12/12/20, "Chart reviewed as part of pre-operative protocol coverage. Given past medical history and time since last visit, based on ACC/AHA guidelines, Oscar [name] would be at acceptable risk for the planned procedure without further cardiovascular testing.   His Plavix may be held for 5 days prior to his procedure.  Please resume as soon as hemostasis is achieved at the discretion of the surgeon."  Anticipate pt can proceed with planned procedure barring acute status change.   VS: BP (!) 141/85   Pulse 80   Temp 36.9 C (Oral)   Resp 18   Ht 5\' 7"  (1.702 m)   Wt 65.3 kg   SpO2 100%   BMI 22.55 kg/m   PROVIDERS: Berkley Harvey, NP is PCP   Jyl Heinz, MD is Cardiologist  LABS: Labs reviewed: Acceptable for surgery. (all labs ordered are listed, but only abnormal results are displayed)  Labs Reviewed  BASIC METABOLIC PANEL  CBC     IMAGES:   EKG: 10/08/2020 Rate 74 bpm  Sinus rhythm  LBBB No significant change since last tracing   CV: Echo 03/12/2020 1. Left ventricular ejection fraction, by estimation, is 55 to 60%. The  left ventricle has normal function. The left ventricle has no regional  wall motion abnormalities. There is moderate concentric left ventricular  hypertrophy. Left ventricular  diastolic parameters are consistent with Grade I diastolic dysfunction   (impaired relaxation). Elevated left atrial pressure.  2. Right ventricular systolic function is normal. The right ventricular  size is normal. There is normal pulmonary artery systolic pressure.  3. The mitral valve is normal in structure. Mild mitral valve  regurgitation. No evidence of mitral stenosis.  4. The aortic valve is normal in structure. Aortic valve regurgitation is  not visualized. Mild to moderate aortic valve sclerosis/calcification is  present, without any evidence of aortic stenosis.  5. Aortic dilatation noted. There is mild dilatation of the ascending  aorta measuring 41 mm.  6. The inferior vena cava is normal in size with greater than 50%  respiratory variability, suggesting right atrial pressure of 3 mmHg.   Cardiac Cath 07/13/2019  Prox RCA lesion is 10% stenosed.  Prox Cx to Mid Cx lesion is 20% stenosed.  Mid Cx lesion is 30% stenosed.  1st Diag lesion is 20% stenosed.  The left ventricular systolic function is normal.  LV end diastolic pressure is normal.  The left ventricular ejection fraction is 50-55% by visual estimate.  There is trivial (1+) mitral regurgitation.   Mild nonobstructive CAD with 20% narrowing in a proximal diagonal branch of the LAD prior to its bifurcation and a otherwise normal LAD system; smooth proximal 20% left circumflex stenosis followed by 30% smooth eccentric mid AV groove stenosis; and mild 10% smooth narrowing in the mid dominant RCA.  Low normal global LV function with EF estimate at 50 to 55% in this patient with chronic left bundle branch block.  There appears  to be only trivial mitral regurgitation. LVEDP 13 mm Hg.  RECOMMENDATION: Medical therapy for mild nonobstructive CAD.  Past Medical History:  Diagnosis Date  . Abnormal nuclear stress test   . Abnormal results of thyroid function studies 01/19/2014   Formatting of this note might be different from the original. August 2016  TSH 4.01  . Acute CVA  (cerebrovascular accident) ([place]) 03/12/2020  . Adult body mass index 26.0-26.9 06/07/2013  . Apneic episode 11/29/2020  . Atrial tachycardia ([place]) 01/24/2020  . Atrial tachycardia, paroxysmal ([place]) 08/05/2019  . Bladder tumor   . Bunion of great toe of right foot 09/18/2016  . Cancer of bladder ([place]) 02/10/2013  . Centrilobular emphysema ([place]) 11/26/2020  . Cervical myelopathy ([place]) 01/27/2018  . Cervical stenosis of spine   . CHB (complete heart block) ([place]) 09/28/2020  . Coronary artery disease involving native coronary artery of native heart without angina pectoris 08/05/2019  . CVA (cerebral vascular accident) ([place]) 03/12/2020  . E. coli infection   . Encounter for general adult medical examination without abnormal findings 05/20/2016  . Essential hypertension 03/12/2020  . Exertional dyspnea 01/18/2020  . Gastro-esophageal reflux disease with esophagitis 09/28/2010  . GERD (gastroesophageal reflux disease)   . History of appendectomy   . History of bladder cancer 03/14/2014  . History of concussion    X2   NO RESIDUALS  . History of peptic ulcer 1970'S  . Hospital discharge follow-up 03/28/2020  . Hyperlipidemia   . Hyponatremia 11/13/2020  . Hypothyroidism (acquired) 03/12/2020  . Hypothyroidism due to Hashimoto's thyroiditis 07/23/2019  . Irregular heart beat   . LBBB (left bundle branch block)   . Mixed hyperlipidemia 11/24/2013   Formatting of this note might be different from the original. History of TIA.  The goal is to have your total cholesterol < 200, the HDL (good cholesterol) >40, and the LDL (bad cholesterol) <100. It is recomended that you follow a good low fat diet and exercise for 30 minutes 3-4 times a week.  . Near syncope 05/16/2014  . Personal history of tobacco use, presenting hazards to health 01/12/2014  . Presence of permanent cardiac pacemaker 11/15/2020  . S/P bilateral cataract extraction 09/18/2016  . Serum reaction due to vaccination 09/27/2011   Formatting of this note might  be different from the original. IMPRESSION: seen on Wednesday for reaction to right arm after pneumovax, had redness, warmth , tenderness and edema of right upper arm which is now resolved. monitor area. use warm compresses prn. call prn. discussed will not need another pneumovax, he will consider zostavax in future.  . 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 Status post placement of implantable loop recorder 11/08/2014  . TIA (transient ischemic attack) 04/09/2014  . Transient cerebral ischemia 01/12/2014   Formatting of this note might be different from the original. IMPRESSION: 8 2015 admitted for TIA -negative CT and MRI.  Echocardiogram was also completed which was normal.  Carotid Dopplers negative for significant stenosis.  Sleep study was normal.    Past Surgical History:  Procedure Laterality Date  . ANTERIOR CERVICAL DECOMP/DISCECTOMY FUSION N/A 01/27/2018   Procedure: Anterior Cervical Discectomy Fusion - Cervical Four- Cervical Five;  Surgeon: Earnie Larsson, MD;  Location: [facility];  Service: Neurosurgery;  Laterality: N/A;  Anterior Cervical Discectomy Fusion - Cervical Four- Cervical Five  . APPENDECTOMY  1970  . CARDIOVASCULAR STRESS TEST  03/22/09  . CYSTOSCOPY WITH BIOPSY N/A 02/10/2013   Procedure: CYSTOSCOPY WITH COLD CUP BIOPSY/FULGERATION;  Surgeon: Bernestine Amass, MD;  Location[facility];  Service: Urology;  Laterality: N/A;  ALSO FULGERATION   . INGUINAL HERNIA REPAIR Right 07-14-2002  . LEFT HEART CATH AND CORONARY ANGIOGRAPHY N/A 07/13/2019   Procedure: LEFT HEART CATH AND CORONARY ANGIOGRAPHY;  Surgeon: Troy Sine, MD;  Location: [place] [facility] CV LAB;  Service: Cardiovascular;  Laterality: N/A;  . LOOP RECORDER IMPLANT N/A 05/24/2014   Procedure: LOOP RECORDER IMPLANT;  Surgeon: Sanda Klein, MD;  Location: [facility] CATH LAB;  Service: Cardiovascular;  Laterality: N/A;  . LOOP RECORDER REMOVAL N/A 10/02/2020   Procedure: LOOP RECORDER REMOVAL;   Surgeon: Sanda Klein, MD;  Location: [place] CV LAB;  Service: Cardiovascular;  Laterality: N/A;  . NECK SURGERY    . PACEMAKER IMPLANT N/A 10/02/2020   Procedure: PACEMAKER IMPLANT;  Surgeon: Sanda Klein, MD;  Location: [place] CV LAB;  Service: Cardiovascular;  Laterality: N/A;  . TONSILLECTOMY    . TRANSURETHRAL RESECTION OF BLADDER TUMOR  03-10-2008  . TUMOR EXCISION Left    Left arm    MEDICATIONS: . atorvastatin (LIPITOR) 40 MG tablet  . clopidogrel (PLAVIX) 75 MG tablet  . furosemide (LASIX) 20 MG tablet  . Glucosamine-Chondroit-Vit C-Mn (GLUCOSAMINE 1500 COMPLEX PO)  . Olopatadine HCl 0.2 % SOLN  . omeprazole (PRILOSEC) 20 MG capsule  . sertraline (ZOLOFT) 50 MG tablet  . tamsulosin (FLOMAX) 0.4 MG CAPS capsule   . lidocaine-EPINEPHrine (XYLOCAINE W/EPI) 1 %-1:100000 (with pres) injection 10 mL   Konrad Felix, PA-C [facility] Pre-Surgical Testing [phone]

## 2020-12-26 ENCOUNTER — Other Ambulatory Visit (HOSPITAL_COMMUNITY)
Admission: RE | Admit: 2020-12-26 | Discharge: 2020-12-26 | Disposition: A | Discharge status: Home / Self Care | Payer: Medicare Other | Source: Ambulatory Visit | Attending: Urology | Admitting: Urology

## 2020-12-26 DIAGNOSIS — Z20822 Contact with and (suspected) exposure to covid-19: Secondary | ICD-10-CM | POA: Diagnosis not present

## 2020-12-26 DIAGNOSIS — G4733 Obstructive sleep apnea (adult) (pediatric): Secondary | ICD-10-CM | POA: Diagnosis not present

## 2020-12-26 DIAGNOSIS — Z01812 Encounter for preprocedural laboratory examination: Secondary | ICD-10-CM | POA: Diagnosis present

## 2020-12-26 LAB — SARS CORONAVIRUS 2 (TAT 6-24 HRS): SARS Coronavirus 2: NEGATIVE

## 2020-12-26 NOTE — Procedures (Signed)
Patient Name: Oscar [name], Oscar [name] Date: 12/20/2020 Gender: Male D.O.B: [birth year] Age (years): 12 Referring Provider: Freda Jackson Height (inches): 69 Interpreting Physician: Kara Mead MD, ABSM Weight (lbs): 145 RPSGT: Laren Everts BMI: 23 MRN: [id number] Neck Size: 13.75 <br> <br> CLINICAL INFORMATION Sleep Study Type: NPSG    Indication for sleep study: Hypertension, Re-Evaluation, Witnesses Apnea / Gasping During Sleep,  coronary artery disease, CVA 02/2020 and complete heart block s/p pacemaker 10/02/20    Epworth Sleepiness Score: 8   Most recent polysomnogram dated 12/08/2013 revealed an AHI of 3.2/h. SLEEP STUDY TECHNIQUE As per the AASM Manual for the Scoring of Sleep and Associated Events v2.3 (April 2016) with a hypopnea requiring 4% desaturations.  The channels recorded and monitored were frontal, central and occipital EEG, electrooculogram (EOG), submentalis EMG (chin), nasal and oral airflow, thoracic and abdominal wall motion, anterior tibialis EMG, snore microphone, electrocardiogram, and pulse oximetry.  MEDICATIONS Medications self-administered by patient taken the night of the study : N/A  SLEEP ARCHITECTURE The study was initiated at 9:47:05 PM and ended at 4:54:38 AM.  Sleep onset time was 4.5 minutes and the sleep efficiency was 77.3%%. The total sleep time was 330.5 minutes.  Stage REM latency was 300.5 minutes.  The patient spent 16.5%% of the night in stage N1 sleep, 74.7%% in stage N2 sleep, 0.0%% in stage N3 and 8.8% in REM.  Alpha intrusion was absent.  Supine sleep was 41.91%.  RESPIRATORY PARAMETERS The overall apnea/hypopnea index (AHI) was 26.3 per hour. There were 114 total apneas, including 97 obstructive, 12 central and 5 mixed apneas. There were 31 hypopneas and 10 RERAs.  The AHI during Stage REM sleep was 6.2 per hour.  AHI while supine was 61.1 per hour.  The mean oxygen saturation was 94.0%. The minimum SpO2 during  sleep was 85.0%.  soft snoring was noted during this study.  CARDIAC DATA The 2 lead EKG demonstrated pacemaker generated. The mean heart rate was 67.1 beats per minute. Other EKG findings include: None. LEG MOVEMENT DATA The total PLMS were 0 with a resulting PLMS index of 0.0. SIgnificant leg movements noted but Associated arousal with leg movement index was 2.0 .  IMPRESSIONS - Moderate obstructive sleep apnea occurred during this study (AHI = 26.3/h). Events were only noted during supine sleep - No significant central sleep apnea occurred during this study (CAI = 2.2/h). - Mild oxygen desaturation was noted during this study (Min O2 = 85.0%). - The patient snored with soft snoring volume. - No cardiac abnormalities were noted during this study. - Clinically significant periodic limb movements did not occur during sleep. No significant associated arousals.   DIAGNOSIS - Obstructive Sleep Apnea (G47.33) , positional   RECOMMENDATIONS - Therapeutic CPAP titration to determine optimal pressure required to alleviate sleep disordered breathing. Alternatively autoCPAP or even oral appliance can be tried - Although events are only noted during supine sleep, given severity , positional therapy alone avoiding supine position during sleep may not be sufficient. - Avoid alcohol, sedatives and other CNS depressants that may worsen sleep apnea and disrupt normal sleep architecture. - Sleep hygiene should be reviewed to assess factors that may improve sleep quality. - Weight management and regular exercise should be initiated or continued if appropriate.   Kara Mead MD Board Certified in [place]

## 2020-12-27 ENCOUNTER — Telehealth: Payer: Self-pay | Admitting: Pulmonary Disease

## 2020-12-27 MED ORDER — SPIRIVA RESPIMAT 2.5 MCG/ACT IN AERS: 2.0000 | INHALATION_SPRAY | Freq: Every day | RESPIRATORY_TRACT | 5 refills | Status: DC

## 2020-12-27 NOTE — Telephone Encounter (Signed)
Called and spoke with Patient. Patient stated he was calling to get a Spiriva prescription sent to pharmacy. Requested Spiriva sent to [facility]. Nothing further at this time.  11/03/20 OV per Dr. Erin Fulling-  We will schedule you for a sleep study at [facility]  We will have you follow up in 2 months with pulmonary function testing at that time.  Start Spiriva respimat 2 puffs once daily      Instructions     Return in about 2 months (around 01/01/2021).  We will schedule you for a sleep study at [facility]  We will have you follow up in 2 months with pulmonary function testing at that time.

## 2020-12-27 NOTE — Telephone Encounter (Signed)
Called pt's home number, no answer and no vm available  Called his cell and his daughter, Oscar [name] answered  She is NOT on DPR and I explained to her will need pt's permission to speak with her  She is on her way to his home and will have him call us once she gets there  Will await her call back

## 2020-12-27 NOTE — Anesthesia Preprocedure Evaluation (Addendum)
Anesthesia Evaluation  Patient identified by MRN, date of birth, ID band Patient awake    Reviewed: Allergy & Precautions, NPO status , Patient's Chart, lab work & pertinent test results  Airway Mallampati: II  TM Distance: >3 FB Neck ROM: Full    Dental no notable dental hx. (+) Edentulous Upper, Dental Advisory Given   Pulmonary COPD, former smoker,    Pulmonary exam normal breath sounds clear to auscultation       Cardiovascular hypertension, Pt. on medications + CAD  Normal cardiovascular exam+ pacemaker (Placed in 10/02/2020 for Sx Bradycardia due to  complete heart block)  Rhythm:Regular Rate:Normal  10/08/2020 EKG Sr R 74 w LBBB  02/2020 Echo 1. Left ventricular ejection fraction, by estimation, is 55 to 60%. The  left ventricle has normal function. The left ventricle has no regional  wall motion abnormalities. There is moderate concentric left ventricular  hypertrophy. Left ventricular  diastolic parameters are consistent with Grade I diastolic dysfunction  (impaired relaxation). Elevated left atrial pressure.  2. Right ventricular systolic function is normal. The right ventricular  size is normal. There is normal pulmonary artery systolic pressure.  3. The mitral valve is normal in structure. Mild mitral valve  regurgitation. No evidence of mitral stenosis.  4. The aortic valve is normal in structure. Aortic valve regurgitation is  not visualized. Mild to moderate aortic valve sclerosis/calcification is  present, without any evidence of aortic stenosis.  5. Aortic dilatation noted. There is mild dilatation of the ascending  aorta measuring 41 mm.    Neuro/Psych CVA    GI/Hepatic GERD  ,  Endo/Other  Hypothyroidism   Renal/GU      Musculoskeletal   Abdominal   Peds  Hematology Lab Results      Component                Value               Date                      WBC                      6.1                  12/22/2020                HGB                      13.1                12/22/2020                HCT                      41.4                12/22/2020                MCV                      96.5                12/22/2020                PLT                      184  12/22/2020              Anesthesia Other Findings   Reproductive/Obstetrics                          Anesthesia Physical Anesthesia Plan  ASA: III  Anesthesia Plan: General   Post-op Pain Management:    Induction: Intravenous  PONV Risk Score and Plan: 3 and Treatment may vary due to age or medical condition, Ondansetron and Dexamethasone  Airway Management Planned: LMA  Additional Equipment: None  Intra-op Plan:   Post-operative Plan:   Informed Consent: I have reviewed the patients History and Physical, chart, labs and discussed the procedure including the risks, benefits and alternatives for the proposed anesthesia with the patient or authorized representative who has indicated his/her understanding and acceptance.     Dental advisory given  Plan Discussed with: CRNA and Anesthesiologist  Anesthesia Plan Comments:        Anesthesia Quick Evaluation

## 2020-12-28 ENCOUNTER — Encounter (HOSPITAL_COMMUNITY): Payer: Self-pay | Admitting: Urology

## 2020-12-28 ENCOUNTER — Ambulatory Visit (HOSPITAL_COMMUNITY)
Admission: RE | Admit: 2020-12-28 | Discharge: 2020-12-28 | Disposition: A | Discharge status: Home / Self Care | Payer: Medicare Other | Source: Ambulatory Visit | Attending: Urology | Admitting: Urology

## 2020-12-28 ENCOUNTER — Encounter (HOSPITAL_COMMUNITY)
Admission: RE | Disposition: A | Discharge status: Home / Self Care | Payer: Self-pay | Source: Ambulatory Visit | Attending: Urology

## 2020-12-28 ENCOUNTER — Ambulatory Visit (HOSPITAL_COMMUNITY): Payer: Medicare Other | Admitting: Physician Assistant

## 2020-12-28 ENCOUNTER — Ambulatory Visit (HOSPITAL_COMMUNITY): Payer: Medicare Other

## 2020-12-28 ENCOUNTER — Ambulatory Visit (HOSPITAL_COMMUNITY): Payer: Medicare Other | Admitting: Anesthesiology

## 2020-12-28 DIAGNOSIS — R310 Gross hematuria: Secondary | ICD-10-CM | POA: Insufficient documentation

## 2020-12-28 DIAGNOSIS — Z8673 Personal history of transient ischemic attack (TIA), and cerebral infarction without residual deficits: Secondary | ICD-10-CM | POA: Insufficient documentation

## 2020-12-28 DIAGNOSIS — C679 Malignant neoplasm of bladder, unspecified: Secondary | ICD-10-CM | POA: Diagnosis present

## 2020-12-28 DIAGNOSIS — Z7982 Long term (current) use of aspirin: Secondary | ICD-10-CM | POA: Diagnosis not present

## 2020-12-28 DIAGNOSIS — C672 Malignant neoplasm of lateral wall of bladder: Secondary | ICD-10-CM | POA: Diagnosis not present

## 2020-12-28 DIAGNOSIS — Z8249 Family history of ischemic heart disease and other diseases of the circulatory system: Secondary | ICD-10-CM | POA: Insufficient documentation

## 2020-12-28 DIAGNOSIS — Z7902 Long term (current) use of antithrombotics/antiplatelets: Secondary | ICD-10-CM | POA: Insufficient documentation

## 2020-12-28 DIAGNOSIS — Z79899 Other long term (current) drug therapy: Secondary | ICD-10-CM | POA: Insufficient documentation

## 2020-12-28 DIAGNOSIS — Z8551 Personal history of malignant neoplasm of bladder: Secondary | ICD-10-CM | POA: Insufficient documentation

## 2020-12-28 DIAGNOSIS — Z87891 Personal history of nicotine dependence: Secondary | ICD-10-CM | POA: Diagnosis not present

## 2020-12-28 DIAGNOSIS — D494 Neoplasm of unspecified behavior of bladder: Secondary | ICD-10-CM

## 2020-12-28 DIAGNOSIS — Z833 Family history of diabetes mellitus: Secondary | ICD-10-CM | POA: Insufficient documentation

## 2020-12-28 HISTORY — PX: TRANSURETHRAL RESECTION OF BLADDER TUMOR: SHX2575

## 2020-12-28 SURGERY — TURBT (TRANSURETHRAL RESECTION OF BLADDER TUMOR)
Anesthesia: General | Site: Bladder | Laterality: Bilateral

## 2020-12-28 MED ORDER — ORAL CARE MOUTH RINSE: 15.0000 mL | Freq: Once | OROMUCOSAL | Status: AC

## 2020-12-28 MED ORDER — DEXAMETHASONE SODIUM PHOSPHATE 10 MG/ML IJ SOLN
INTRAMUSCULAR | Status: DC | PRN
  Administered 2020-12-28: 10 mg via INTRAVENOUS

## 2020-12-28 MED ORDER — CHLORHEXIDINE GLUCONATE 0.12 % MT SOLN
15.0000 mL | Freq: Once | OROMUCOSAL | Status: AC
  Administered 2020-12-28: 15 mL via OROMUCOSAL

## 2020-12-28 MED ORDER — PHENYLEPHRINE HCL-NACL 10-0.9 MG/250ML-% IV SOLN
INTRAVENOUS | Status: DC | PRN
  Administered 2020-12-28: 35 ug/min via INTRAVENOUS

## 2020-12-28 MED ORDER — ACETAMINOPHEN 10 MG/ML IV SOLN: 1000.0000 mg | Freq: Once | INTRAVENOUS | Status: DC | PRN

## 2020-12-28 MED ORDER — SODIUM CHLORIDE 0.9 % IV SOLN
INTRAVENOUS | Status: DC | PRN
  Administered 2020-12-28: 20 mL

## 2020-12-28 MED ORDER — ONDANSETRON HCL 4 MG/2ML IJ SOLN
INTRAMUSCULAR | Status: DC | PRN
  Administered 2020-12-28: 4 mg via INTRAVENOUS

## 2020-12-28 MED ORDER — LACTATED RINGERS IV SOLN: INTRAVENOUS | Status: DC

## 2020-12-28 MED ORDER — SODIUM CHLORIDE 0.9 % IR SOLN
Status: DC | PRN
  Administered 2020-12-28: 12000 mL via INTRAVESICAL

## 2020-12-28 MED ORDER — DEXAMETHASONE SODIUM PHOSPHATE 10 MG/ML IJ SOLN
INTRAMUSCULAR | Status: AC
  Filled 2020-12-28: qty 1

## 2020-12-28 MED ORDER — PHENYLEPHRINE 40 MCG/ML (10ML) SYRINGE FOR IV PUSH (FOR BLOOD PRESSURE SUPPORT)
PREFILLED_SYRINGE | INTRAVENOUS | Status: DC | PRN
  Administered 2020-12-28 (×4): 80 ug via INTRAVENOUS

## 2020-12-28 MED ORDER — FENTANYL CITRATE (PF) 100 MCG/2ML IJ SOLN
INTRAMUSCULAR | Status: AC
  Filled 2020-12-28: qty 2

## 2020-12-28 MED ORDER — CEFAZOLIN SODIUM-DEXTROSE 2-4 GM/100ML-% IV SOLN
2.0000 g | INTRAVENOUS | Status: AC
  Administered 2020-12-28: 2 g via INTRAVENOUS
  Filled 2020-12-28: qty 100

## 2020-12-28 MED ORDER — CLOPIDOGREL BISULFATE 75 MG PO TABS: 75.0000 mg | ORAL_TABLET | Freq: Every day | ORAL | 3 refills | Status: DC

## 2020-12-28 MED ORDER — FENTANYL CITRATE (PF) 100 MCG/2ML IJ SOLN
INTRAMUSCULAR | Status: DC | PRN
  Administered 2020-12-28: 25 ug via INTRAVENOUS
  Administered 2020-12-28: 50 ug via INTRAVENOUS
  Administered 2020-12-28: 25 ug via INTRAVENOUS

## 2020-12-28 MED ORDER — ONDANSETRON HCL 4 MG/2ML IJ SOLN: 4.0000 mg | Freq: Once | INTRAMUSCULAR | Status: DC | PRN

## 2020-12-28 MED ORDER — TRAMADOL HCL 50 MG PO TABS: 50.0000 mg | ORAL_TABLET | Freq: Four times a day (QID) | ORAL | 0 refills | Status: DC | PRN

## 2020-12-28 MED ORDER — PROPOFOL 10 MG/ML IV BOLUS
INTRAVENOUS | Status: DC | PRN
  Administered 2020-12-28: 120 mg via INTRAVENOUS

## 2020-12-28 MED ORDER — LIDOCAINE 2% (20 MG/ML) 5 ML SYRINGE
INTRAMUSCULAR | Status: DC | PRN
  Administered 2020-12-28: 60 mg via INTRAVENOUS

## 2020-12-28 MED ORDER — FENTANYL CITRATE (PF) 100 MCG/2ML IJ SOLN: 25.0000 ug | INTRAMUSCULAR | Status: DC | PRN

## 2020-12-28 MED ORDER — ONDANSETRON HCL 4 MG/2ML IJ SOLN
INTRAMUSCULAR | Status: AC
  Filled 2020-12-28: qty 2

## 2020-12-28 MED ORDER — LIDOCAINE 2% (20 MG/ML) 5 ML SYRINGE
INTRAMUSCULAR | Status: AC
  Filled 2020-12-28: qty 5

## 2020-12-28 MED ORDER — PROPOFOL 10 MG/ML IV BOLUS
INTRAVENOUS | Status: AC
  Filled 2020-12-28: qty 20

## 2020-12-28 SURGICAL SUPPLY — 16 items
BAG URO CATCHER STRL LF (MISCELLANEOUS) ×2 IMPLANT
DRAPE FOOT SWITCH (DRAPES) ×2 IMPLANT
ELECT REM PT RETURN 15FT ADLT (MISCELLANEOUS) ×2 IMPLANT
GLOVE SURG ENC TEXT LTX SZ7.5 (GLOVE) ×2 IMPLANT
GOWN STRL REUS W/TWL XL LVL3 (GOWN DISPOSABLE) ×2 IMPLANT
KIT TURNOVER KIT A (KITS) ×2 IMPLANT
LOOP CUT BIPOLAR 24F LRG (ELECTROSURGICAL) IMPLANT
MANIFOLD NEPTUNE II (INSTRUMENTS) ×2 IMPLANT
NDL SAFETY ECLIPSE 18X1.5 (NEEDLE) ×1 IMPLANT
NEEDLE HYPO 18GX1.5 SHARP (NEEDLE) ×2
PACK CYSTO (CUSTOM PROCEDURE TRAY) ×2 IMPLANT
PENCIL SMOKE EVACUATOR (MISCELLANEOUS) IMPLANT
SYR TOOMEY IRRIG 70ML (MISCELLANEOUS) ×2
SYRINGE TOOMEY IRRIG 70ML (MISCELLANEOUS) IMPLANT
TUBING CONNECTING 10 (TUBING) ×2 IMPLANT
TUBING UROLOGY SET (TUBING) ×2 IMPLANT

## 2020-12-28 NOTE — Telephone Encounter (Signed)
Pt has rescheduled his PFT until 01/18/21. Nothing further needed at this time.

## 2020-12-28 NOTE — Anesthesia Procedure Notes (Signed)
Procedure Name: LMA Insertion Date/Time: 12/28/2020 11:36 AM Performed by: Chapman Matteucci D, CRNA Pre-anesthesia Checklist: Patient identified, Emergency Drugs available, Suction available and Patient being monitored Patient Re-evaluated:Patient Re-evaluated prior to induction Oxygen Delivery Method: Circle system utilized Preoxygenation: Pre-oxygenation with 100% oxygen Induction Type: IV induction Ventilation: Mask ventilation without difficulty LMA: LMA inserted LMA Size: 4.0 Tube type: Oral Number of attempts: 1 Placement Confirmation: positive ETCO2 and breath sounds checked- equal and bilateral Tube secured with: Tape Dental Injury: Teeth and Oropharynx as per pre-operative assessment

## 2020-12-28 NOTE — H&P (Signed)
87 year old male who is seen today for concern recurrence of his bladder cancer. The patient was initially diagnosed with high-grade non muscle invasive bladder cancer in 2009. This was a 2 cm solitary tumor. He was never found to have any recurrence following that, and followed up about every 6 months until 2016. He has not been seen since that time. Is a CT scan was performed in January given his history of malignancy and was largely unremarkable, but there was some thickening of the bladder wall on the right-hand side.   The patient denies any hematuria. He denies any dysuria. He denies any worsening frequency or urgency. He had a stroke last summer, and also is having some cardiac difficulty. He takes Plavix.     ALLERGIES: No Allergies    MEDICATIONS: Tamsulosin Hcl 0.4 mg capsule TAKE ONE CAPSULE BY MOUTH ONCE DAILY  Aleve CAPS 0 Oral  Amoxicillin-Clavulanate Potass  Aspirin Ec 81 mg tablet, delayed release 0 Oral  Atorvastatin Calcium 40 mg tablet  Clopidogrel 75 mg tablet  CoQ10 CAPS Oral  Furosemide 20 mg tablet  Glucosamine CAPS 0 Oral  Glucosamine Complex 1,500 mg-400 unit-100 mg tablet  Omeprazole 20 mg capsule,delayed release 0 Oral  Simvastatin 80 mg tablet 0 Oral  Tamsulosin HCl - 0.4 MG Oral Capsule 0 Oral  Tizanidine Hcl 2 mg capsule     GU PSH: Cystoscopy TURBT <2 cm - 2014 Cystoscopy TURBT 2-5 cm - 2009       PSH Notes: Cystoscopy With Fulguration Small Lesion (5-85mm), Cystoscopy With Fulguration Medium Lesion (2-5cm), Appendectomy, Inguinal Hernia Repair. pacemaker   NON-GU PSH: Appendectomy - 2009     GU PMH: BPH w/LUTS, Benign prostatic hyperplasia with urinary obstruction - 2016 History of bladder cancer, History of bladder cancer - 2016 Weak Urinary Stream, Weak urinary stream - 2016 Bladder Cancer, Unspec, Bladder cancer - 2015      PMH Notes:  2008-02-16 09:39:36 - Note: Arthritis   NON-GU PMH: Encounter for general adult medical examination  without abnormal findings, Encounter for preventive health examination - 2015 Cardiac murmur, unspecified, Murmurs - 2014 Gastric ulcer, unspecified as acute or chronic, without hemorrhage or perforation, Gastric Ulcer - 2014 Personal history of other specified conditions, History of heartburn - 2014 Arthritis GERD Hypercholesterolemia Hypothyroidism Stroke/TIA    FAMILY HISTORY: 2 daughters - Daughter 1 son - Son Acute Myocardial Infarction - Brother Aneurysm - Father Aortic Aneurysm - Father Diabetes - Brother Family Health Status Number - Runs In Family Father Deceased At [employer] ___ - Runs In Family heart failure - Mother Mother Deceased At Age 25 from diabetic complicati - Runs In Family   SOCIAL HISTORY: Marital Status: Married Preferred Language: English; Ethnicity: Not Hispanic Or Latino; Race: White Current Smoking Status: Patient does not smoke anymore. Has not smoked since 11/21/1985. Smoked for 30 years. Smoked 1 pack per day.   Tobacco Use Assessment Completed: Used Tobacco in last 30 days? Drinks 4+ caffeinated drinks per day. Patient's occupation is/was Retired.     Notes: Caffeine Use, Previous History Of Smoking, Marital History - Currently Married, Alcohol Use, Occupation:   REVIEW OF SYSTEMS:    GU Review Male:   Patient reports frequent urination and get up at night to urinate. Patient denies leakage of urine, erection problems, burning/ pain with urination, penile pain, have to strain to urinate , stream starts and stops, hard to postpone urination, and trouble starting your stream.  Gastrointestinal (Upper):   Patient reports indigestion/ heartburn. Patient denies  nausea and vomiting.  Gastrointestinal (Lower):   Patient denies diarrhea and constipation.  Constitutional:   Patient reports fatigue. Patient denies fever, night sweats, and weight loss.  Skin:   Patient denies skin rash/ lesion and itching.  Eyes:   Patient denies blurred vision and double vision.   Ears/ Nose/ Throat:   Patient denies sore throat and sinus problems.  Hematologic/Lymphatic:   Patient denies swollen glands and easy bruising.  Cardiovascular:   Patient denies leg swelling and chest pains.  Respiratory:   Patient denies cough and shortness of breath.  Endocrine:   Patient denies excessive thirst.  Musculoskeletal:   Patient reports back pain. Patient denies joint pain.  Neurological:   Patient reports dizziness. Patient denies headaches.  Psychologic:   Patient denies depression and anxiety.   Notes: Nocturia x3-5, slight weak stream    VITAL SIGNS:      12/07/2020 02:40 PM  Weight 142 lb / 64.41 kg  Height 67 in / 170.18 cm  Temperature 98.0 F / 36.6 C  BMI 22.2 kg/m   MULTI-SYSTEM PHYSICAL EXAMINATION:    Constitutional: Well-nourished. No physical deformities. Normally developed. Good grooming.  Neck: Neck symmetrical, not swollen. Normal tracheal position.  Respiratory: Normal breath sounds. No labored breathing, no use of accessory muscles.   Cardiovascular: Normal temperature, normal extremity pulses, no swelling, no varicosities.  Lymphatic: No enlargement of neck, axillae, groin.  Skin: No paleness, no jaundice, no cyanosis. No lesion, no ulcer, no rash.  Neurologic / Psychiatric: Oriented to time, oriented to place, oriented to person. No depression, no anxiety, no agitation.  Gastrointestinal: No mass, no tenderness, no rigidity, non obese abdomen.  Eyes: Normal conjunctivae. Normal eyelids.  Ears, Nose, Mouth, and Throat: Left ear no scars, no lesions, no masses. Right ear no scars, no lesions, no masses. Nose no scars, no lesions, no masses. Normal hearing. Normal lips.  Musculoskeletal: Normal gait and station of head and neck.     Complexity of Data:  Source Of History:  Patient  Records Review:   Pathology Reports, Previous Doctor Records, Previous Patient Records  Urine Test Review:   Urinalysis  X-Ray Review: C.T. Abdomen/Pelvis: Reviewed  Films. Discussed With Patient.     PROCEDURES:         Flexible Cystoscopy - 52000  Risks, benefits, and some of the potential complications of the procedure were discussed at length with the patient including infection, bleeding, voiding discomfort, urinary retention, fever, chills, sepsis, and others. All questions were answered. Informed consent was obtained. Sterile technique and intraurethral analgesia were used.  Meatus:  Normal size. Normal location. Normal condition.  Urethra:  No strictures.  External Sphincter:  Normal.  Verumontanum:  Normal.  Prostate:  Non-obstructing. No hyperplasia.  Bladder Neck:  Non-obstructing.  Ureteral Orifices:  Normal location. Normal size. Normal shape. Effluxed clear urine.  Bladder:  The patient had a velvet like appearance of the mucosa on the right lateral wall of his bladder around the area of previous resection concerning for CIS. There was also a small tumor around the right ureteral orifice.      The lower urinary tract was carefully examined. The procedure was well-tolerated and without complications. Antibiotic instructions were given. Instructions were given to call the office immediately for bloody urine, difficulty urinating, urinary retention, painful or frequent urination, fever, chills, nausea, vomiting or other illness. The patient stated that he understood these instructions and would comply with them.         Urinalysis -  81003 Dipstick Dipstick Cont'd  Color: Yellow Bilirubin: Neg  Appearance: Clear Ketones: Neg  Specific Gravity: 1.020 Blood: Neg  pH: 5.5 Protein: Neg  Glucose: Neg Urobilinogen: 0.2    Nitrites: Neg    Leukocyte Esterase: Neg    Notes:      ASSESSMENT:      ICD-10 Details  1 GU:   Bladder Cancer, Unspec - C67.9   2   History of bladder cancer - Z85.51    PLAN:           Schedule Return Visit/Planned Activity: ASAP - Schedule Surgery          Document Letter(s):  Created for Patient: Clinical  Summary         Notes:   The patient has a small bladder tumor the trigone in a an area of CIS or concerning area around the scar his previous resection. I went through this with him in detail and recommended that we proceed to the OR for removal and fulguration of the erythematous region. I tried to explain to the patient the surgery in detail. As explained him the nature of the surgery as well as the postoperative expectations. He will need clearance from Cardiology and need stop his Plavix. Will reach out to his cardiologist for this.

## 2020-12-28 NOTE — Op Note (Addendum)
Preoperative diagnosis:  1. Bladder tumor, bladder erythema concerning for CIS  Postoperative diagnosis:  1. Same  Procedure: 1. Transurethral resection of bladder tumor, 2 cm 2. Bladder biopsy and fulguration 3. Bilateral retrograde pyelogram with interpretation  Surgeon: Ardis Hughs, MD  Anesthesia: General  Complications: None  Intraoperative findings:  #1: The patient had several small papillary bladder tumors on the right lateral wall, the largest one measuring approximately 1 cm, 3 resections made. #2: The patient's right posterior and lateral wall were involved with extensive erythema and mucosa concerning for CIS.  This all was fulgurated completely. #3: The patient's right retrograde pyelogram was performed with 10 cc of Omnipaque contrast.  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  There were no filling defects.  There is no hydronephrosis. #4: The left retrograde pyelogram was performed with 10 cc of Omnipaque contrast.  This demonstrated normal caliber ureter with no filling defects or abnormalities.  EBL: Minimal  Specimens: Bladder tumors  Indication: Oscar [name] is a 87 y.o. patient with history of gross hematuria who was found to have abnormal appearing bladder with several small bladder tumors.  After reviewing the management options for treatment, he elected to proceed with the above surgical procedure(s). We have discussed the potential benefits and risks of the procedure, side effects of the proposed treatment, the likelihood of the patient achieving the goals of the procedure, and any potential problems that might occur during the procedure or recuperation. Informed consent has been obtained.  Description of procedure:  The patient was taken to the operating room and general anesthesia was induced.  The patient was placed in the dorsal lithotomy position, prepped and draped in the usual sterile fashion, and preoperative  antibiotics were administered. A preoperative time-out was performed.   21 French 2 degrees cystoscope was gently passed through the patient's urethra into the bladder under visual guidance.  Cystoscopy was performed in 3 and 6 degree manner with the above findings.  Using a 5 [place] open-ended ureteral catheter and a right retrograde pyelogram was performed with the above findings.  Similar left retrograde pyelogram was performed with the above findings.  I then exchanged the 21 French cystoscope sheath for the 26 French resectoscope sheath using a visual obturator.  Once into the patient's bladder I exchanged it for a loop element.  I then resected the tumors and fulgurated the remaining parts of the abnormal appearing bladder.  This encompassed at least the third if not one half of the patient's bladder, from the posterior bladder wall anteriorly or to the dome to the bladder neck on the right-hand side.  Once or then was fulgurated and hemostasis was adequate the bladder was emptied and the patient was subsequently awoken.  There is no Foley catheter left.  The patient was subsequently extubated return the PACU in stable condition.  Disposition: Patient is being discharged home.  Ardis Hughs, M.D.

## 2020-12-28 NOTE — Transfer of Care (Signed)
Immediate Anesthesia Transfer of Care Note  Patient: Oscar [name]  Procedure(s) Performed: TRANSURETHRAL RESECTION OF BLADDER TUMOR BLADDER BIOPSY FULGARATION BILATERAL RETROGRADE PYELOGRAM(TURBT) (Bilateral Bladder)  Patient Location: PACU  Anesthesia Type:General  Level of Consciousness: awake, alert  and oriented  Airway & Oxygen Therapy: Patient Spontanous Breathing and Patient connected to face mask oxygen  Post-op Assessment: Report given to RN and Post -op Vital signs reviewed and stable  Post vital signs: Reviewed and stable  Last Vitals:  Vitals Value Taken Time  BP 157/79 12/28/20 1300  Temp    Pulse 59 12/28/20 1301  Resp 17 12/28/20 1301  SpO2 100 % 12/28/20 1301  Vitals shown include unvalidated device data.  Last Pain:  Vitals:   12/28/20 1015  TempSrc: Oral  PainSc: 0-No pain         Complications: No complications documented.

## 2020-12-28 NOTE — Discharge Instructions (Signed)
Transurethral Resection of Bladder Tumor (TURBT) or Bladder Biopsy   Definition:  Transurethral Resection of the Bladder Tumor is a surgical procedure used to diagnose and remove tumors within the bladder. TURBT is the most common treatment for early stage bladder cancer.  General instructions:     Your recent bladder surgery requires very little post hospital care but some definite precautions.  Despite the fact that no skin incisions were used, the area around the bladder incisions are raw and covered with scabs to promote healing and prevent bleeding. Certain precautions are needed to insure that the scabs are not disturbed over the next 2-4 weeks while the healing proceeds.  Because the raw surface inside your bladder and the irritating effects of urine you may expect frequency of urination and/or urgency (a stronger desire to urinate) and perhaps even getting up at night more often. This will usually resolve or improve slowly over the healing period. You may see some blood in your urine over the first 6 weeks. Do not be alarmed, even if the urine was clear for a while. Get off your feet and drink lots of fluids until clearing occurs. If you start to pass clots or don't improve call us.  Diet:  You may return to your normal diet immediately. Because of the raw surface of your bladder, alcohol, spicy foods, foods high in acid and drinks with caffeine may cause irritation or frequency and should be used in moderation. To keep your urine flowing freely and avoid constipation, drink plenty of fluids during the day (8-10 glasses). Tip: Avoid cranberry juice because it is very acidic.  Activity:  Your physical activity doesn't need to be restricted. However, if you are very active, you may see some blood in the urine. We suggest that you reduce your activity under the circumstances until the bleeding has stopped.  Bowels:  It is important to keep your bowels regular during the postoperative  period. Straining with bowel movements can cause bleeding. A bowel movement every other day is reasonable. Use a mild laxative if needed, such as milk of magnesia 2-3 tablespoons, or 2 Dulcolax tablets. Call if you continue to have problems. If you had been taking narcotics for pain, before, during or after your surgery, you may be constipated. Take a laxative if necessary.    Medication:  You should resume your pre-surgery medications unless told not to. In addition you may be given an antibiotic to prevent or treat infection. Antibiotics are not always necessary. All medication should be taken as prescribed until the bottles are finished unless you are having an unusual reaction to one of the drugs.   RESUME PLAVIX IN 3 DAYS.

## 2020-12-29 ENCOUNTER — Ambulatory Visit: Payer: Medicare Other | Admitting: Pulmonary Disease

## 2020-12-29 ENCOUNTER — Encounter (HOSPITAL_COMMUNITY): Payer: Self-pay | Admitting: Urology

## 2020-12-29 LAB — SURGICAL PATHOLOGY

## 2020-12-29 NOTE — Anesthesia Postprocedure Evaluation (Signed)
Anesthesia Post Note  Patient: Oscar [name]  Procedure(s) Performed: TRANSURETHRAL RESECTION OF BLADDER TUMOR BLADDER BIOPSY FULGARATION BILATERAL RETROGRADE PYELOGRAM(TURBT) (Bilateral Bladder)     Patient location during evaluation: PACU Anesthesia Type: General Level of consciousness: awake and alert Pain management: pain level controlled Vital Signs Assessment: post-procedure vital signs reviewed and stable Respiratory status: spontaneous breathing, nonlabored ventilation, respiratory function stable and patient connected to nasal cannula oxygen Cardiovascular status: blood pressure returned to baseline and stable Postop Assessment: no apparent nausea or vomiting Anesthetic complications: no   No complications documented.  Last Vitals:  Vitals:   12/28/20 1430 12/28/20 1530  BP: (!) 148/70 (!) 146/72  Pulse: 60 64  Resp: 16 16  Temp: 36.7 C   SpO2: 98% 98%    Last Pain:  Vitals:   12/28/20 1530  TempSrc:   PainSc: 0-No pain                 Barnet Glasgow

## 2021-01-01 ENCOUNTER — Ambulatory Visit (INDEPENDENT_AMBULATORY_CARE_PROVIDER_SITE_OTHER): Payer: Medicare Other

## 2021-01-01 DIAGNOSIS — I639 Cerebral infarction, unspecified: Secondary | ICD-10-CM | POA: Diagnosis not present

## 2021-01-02 LAB — CUP PACEART REMOTE DEVICE CHECK
Battery Remaining Longevity: 171 mo
Battery Voltage: 3.22 V
Brady Statistic AP VP Percent: 0.08 %
Brady Statistic AP VS Percent: 31.95 %
Brady Statistic AS VP Percent: 0.07 %
Brady Statistic AS VS Percent: 67.9 %
Brady Statistic RA Percent Paced: 32.07 %
Brady Statistic RV Percent Paced: 0.15 %
Date Time Interrogation Session: 20220410214108
Implantable Lead Implant Date: 20220110
Implantable Lead Implant Date: 20220110
Implantable Lead Location: 753859
Implantable Lead Location: 753860
Implantable Lead Model: 5076
Implantable Lead Model: 5076
Implantable Pulse Generator Implant Date: 20220110
Lead Channel Impedance Value: 304 Ohm
Lead Channel Impedance Value: 380 Ohm
Lead Channel Impedance Value: 475 Ohm
Lead Channel Impedance Value: 551 Ohm
Lead Channel Pacing Threshold Amplitude: 0.75 V
Lead Channel Pacing Threshold Amplitude: 0.875 V
Lead Channel Pacing Threshold Pulse Width: 0.4 ms
Lead Channel Pacing Threshold Pulse Width: 0.4 ms
Lead Channel Sensing Intrinsic Amplitude: 14.875 mV
Lead Channel Sensing Intrinsic Amplitude: 14.875 mV
Lead Channel Sensing Intrinsic Amplitude: 3.25 mV
Lead Channel Sensing Intrinsic Amplitude: 3.25 mV
Lead Channel Setting Pacing Amplitude: 2 V
Lead Channel Setting Pacing Amplitude: 2.5 V
Lead Channel Setting Pacing Pulse Width: 0.4 ms
Lead Channel Setting Sensing Sensitivity: 1.2 mV

## 2021-01-04 DIAGNOSIS — D494 Neoplasm of unspecified behavior of bladder: Secondary | ICD-10-CM | POA: Insufficient documentation

## 2021-01-04 DIAGNOSIS — M4802 Spinal stenosis, cervical region: Secondary | ICD-10-CM | POA: Insufficient documentation

## 2021-01-04 DIAGNOSIS — K219 Gastro-esophageal reflux disease without esophagitis: Secondary | ICD-10-CM

## 2021-01-04 DIAGNOSIS — I447 Left bundle-branch block, unspecified: Secondary | ICD-10-CM | POA: Insufficient documentation

## 2021-01-04 HISTORY — DX: Neoplasm of unspecified behavior of bladder: D49.4

## 2021-01-04 HISTORY — DX: Gastro-esophageal reflux disease without esophagitis: K21.9

## 2021-01-08 ENCOUNTER — Encounter: Payer: Self-pay | Admitting: Cardiovascular Disease

## 2021-01-08 ENCOUNTER — Other Ambulatory Visit: Payer: Self-pay

## 2021-01-08 ENCOUNTER — Ambulatory Visit (INDEPENDENT_AMBULATORY_CARE_PROVIDER_SITE_OTHER): Payer: Medicare Other | Admitting: Cardiovascular Disease

## 2021-01-08 VITALS — BP 122/70 | HR 66 | Ht 67.0 in | Wt 145.2 lb

## 2021-01-08 DIAGNOSIS — Z950 Presence of cardiac pacemaker: Secondary | ICD-10-CM

## 2021-01-08 DIAGNOSIS — Z8673 Personal history of transient ischemic attack (TIA), and cerebral infarction without residual deficits: Secondary | ICD-10-CM

## 2021-01-08 DIAGNOSIS — R2681 Unsteadiness on feet: Secondary | ICD-10-CM

## 2021-01-08 DIAGNOSIS — I2510 Atherosclerotic heart disease of native coronary artery without angina pectoris: Secondary | ICD-10-CM | POA: Diagnosis not present

## 2021-01-08 DIAGNOSIS — I442 Atrioventricular block, complete: Secondary | ICD-10-CM

## 2021-01-08 DIAGNOSIS — R252 Cramp and spasm: Secondary | ICD-10-CM | POA: Diagnosis not present

## 2021-01-08 DIAGNOSIS — E7800 Pure hypercholesterolemia, unspecified: Secondary | ICD-10-CM

## 2021-01-08 MED ORDER — FUROSEMIDE 20 MG PO TABS: 20.0000 mg | ORAL_TABLET | ORAL | 3 refills | Status: DC | PRN

## 2021-01-08 MED ORDER — MAGNESIUM 400 MG PO CAPS: 400.0000 mg | ORAL_CAPSULE | Freq: Every day | ORAL | Status: DC

## 2021-01-08 NOTE — Patient Instructions (Signed)
Medication Instructions:  TAKE Furosemide 20 mg 1-2 times a week as needed for swelling START Magnesium supplement 400 mg once daily. This is an over the counter supplement.   *If you need a refill on your cardiac medications before your next appointment, please call your pharmacy*   Lab Work: None ordered If you have labs (blood work) drawn today and your tests are completely normal, you will receive your results only by: Marland Kitchen MyChart Message (if you have MyChart) OR . A paper copy in the mail If you have any lab test that is abnormal or we need to change your treatment, we will call you to review the results.   Testing/Procedures: None ordered   Follow-Up: At [facility], you and your health needs are our priority.  As part of our continuing mission to provide you with exceptional heart care, we have created designated Provider Care Teams.  These Care Teams include your primary Cardiologist (physician) and Advanced Practice Providers (APPs -  Physician Assistants and Nurse Practitioners) who all work together to provide you with the care you need, when you need it.  We recommend signing up for the patient portal called "MyChart".  Sign up information is provided on this After Visit Summary.  MyChart is used to connect with patients for Virtual Visits (Telemedicine).  Patients are able to view lab/test results, encounter notes, upcoming appointments, etc.  Non-urgent messages can be sent to your provider as well.   To learn more about what you can do with MyChart, go to [email or web address].    Your next appointment:   12 month(s)  The format for your next appointment:   In Person  Provider:   Sanda Klein, MD   Other Instructions  Orthostatic Hypotension Blood pressure is a measurement of how strongly, or weakly, your blood is pressing against the walls of your arteries. Orthostatic hypotension is a sudden drop in blood pressure that happens when you quickly change  positions, such as when you get up from sitting or lying down. Arteries are blood vessels that carry blood from your heart throughout your body. When blood pressure is too low, you may not get enough blood to your brain or to the rest of your organs. This can cause weakness, light-headedness, rapid heartbeat, and fainting. This can last for just a few seconds or for up to a few minutes. Orthostatic hypotension is usually not a serious problem. However, if it happens frequently or gets worse, it may be a sign of something more serious. What are the causes? This condition may be caused by:  Sudden changes in posture, such as standing up quickly after you have been sitting or lying down.  Blood loss.  Loss of body fluids (dehydration).  Heart problems.  Hormone (endocrine) problems.  Pregnancy.  Severe infection.  Lack of certain nutrients.  Severe allergic reactions (anaphylaxis).  Certain medicines, such as blood pressure medicine or medicines that make the body lose excess fluids (diuretics). Sometimes, this condition can be caused by not taking medicine as directed, such as taking too much of a certain medicine. What increases the risk? The following factors may make you more likely to develop this condition:  Age. Risk increases as you get older.  Conditions that affect the heart or the central nervous system.  Taking certain medicines, such as blood pressure medicine or diuretics.  Being pregnant. What are the signs or symptoms? Symptoms of this condition may include:  Weakness.  Light-headedness.  Dizziness.  Blurred vision.  Fatigue.  Rapid heartbeat.  Fainting, in severe cases. How is this diagnosed? This condition is diagnosed based on:  Your medical history.  Your symptoms.  Your blood pressure measurement. Your health care provider will check your blood pressure when you are: ? Lying down. ? Sitting. ? Standing. A blood pressure reading is  recorded as two numbers, such as "120 over 80" (or 120/80). The first ("top") number is called the systolic pressure. It is a measure of the pressure in your arteries as your heart beats. The second ("bottom") number is called the diastolic pressure. It is a measure of the pressure in your arteries when your heart relaxes between beats. Blood pressure is measured in a unit called mm Hg. Healthy blood pressure for most adults is 120/80. If your blood pressure is below 90/60, you may be diagnosed with hypotension. Other information or tests that may be used to diagnose orthostatic hypotension include:  Your other vital signs, such as your heart rate and temperature.  Blood tests.  Tilt table test. For this test, you will be safely secured to a table that moves you from a lying position to an upright position. Your heart rhythm and blood pressure will be monitored during the test. How is this treated? This condition may be treated by:  Changing your diet. This may involve eating more salt (sodium) or drinking more water.  Taking medicines to raise your blood pressure.  Changing the dosage of certain medicines you are taking that might be lowering your blood pressure.  Wearing compression stockings. These stockings help to prevent blood clots and reduce swelling in your legs. In some cases, you may need to go to the hospital for:  Fluid replacement. This means you will receive fluids through an IV.  Blood replacement. This means you will receive donated blood through an IV (transfusion).  Treating an infection or heart problems, if this applies.  Monitoring. You may need to be monitored while medicines that you are taking wear off. Follow these instructions at home: Eating and drinking  Drink enough fluid to keep your urine pale yellow.  Eat a healthy diet, and follow instructions from your health care provider about eating or drinking restrictions. A healthy diet includes: ? Fresh  fruits and vegetables. ? Whole grains. ? Lean meats. ? Low-fat dairy products.  Eat extra salt only as directed. Do not add extra salt to your diet unless your health care provider told you to do that.  Eat frequent, small meals.  Avoid standing up suddenly after eating.   Medicines  Take over-the-counter and prescription medicines only as told by your health care provider. ? Follow instructions from your health care provider about changing the dosage of your current medicines, if this applies. ? Do not stop or adjust any of your medicines on your own. General instructions  Wear compression stockings as told by your health care provider.  Get up slowly from lying down or sitting positions. This gives your blood pressure a chance to adjust.  Avoid hot showers and excessive heat as directed by your health care provider.  Return to your normal activities as told by your health care provider. Ask your health care provider what activities are safe for you.  Do not use any products that contain nicotine or tobacco, such as cigarettes, e-cigarettes, and chewing tobacco. If you need help quitting, ask your health care provider.  Keep all follow-up visits as told by your health care provider. This is important.  Contact a health care provider if you:  Vomit.  Have diarrhea.  Have a fever for more than 2-3 days.  Feel more thirsty than usual.  Feel weak and tired. Get help right away if you:  Have chest pain.  Have a fast or irregular heartbeat.  Develop numbness in any part of your body.  Cannot move your arms or your legs.  Have trouble speaking.  Become sweaty or feel light-headed.  Faint.  Feel short of breath.  Have trouble staying awake.  Feel confused. Summary  Orthostatic hypotension is a sudden drop in blood pressure that happens when you quickly change positions.  Orthostatic hypotension is usually not a serious problem.  It is diagnosed by having  your blood pressure taken lying down, sitting, and then standing.  It may be treated by changing your diet or adjusting your medicines. This information is not intended to replace advice given to you by your health care provider. Make sure you discuss any questions you have with your health care provider. Document Revised: 03/05/2018 Document Reviewed: 03/05/2018 [employer] Patient Education  [name].

## 2021-01-08 NOTE — Progress Notes (Signed)
Cardiology Admission History and Physical:   Patient ID: AUM CAGGIANO MRN: [id number]; DOB: 12-14-1930   Admission date: (Not on file)  Primary Care Provider: Berkley Harvey, NP [facility] Cardiologist: Waylen Depaolo [place] Electrophysiologist:  None   Chief Complaint:  CHB  Patient Profile:   Oscar [name] is a 87 y.o. male with recent complaints of dizziness/unsteadiness whose implantable loop recorder showed evidence of transient complete heart block, which was symptomatic.  He underwent a dual-chamber permanent pacemaker implantation on 10/02/2020.  History of Present Illness:   Mr. 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  He had minor coronary artery disease by cardiac catheterization performed in 2020 (maximum 20-30% stenosis in branch vessels) and preserved left ventricular systolic function.  A loop recorder was implanted for recurrent complaints of near syncope.  On January 6 the device showed evidence of a lengthy episode of complete heart block (over 30 seconds) with idioventricular escape rhythm at less than 30 bpm, during which the patient experienced lightheadedness and malaise felt like he was almost about to pass out.  He subsequently underwent implantation of a dual-chamber Medtronic azure pacemaker on October 02, 2020.  He has not had any syncopal or near syncopal events since then, but the pacemaker has not had any positive impact on his complaints of feeling "jittery" and "off balance".  He often feels anxious and uneasy and is afraid of falling. He was prescribed gabapentin for cramps especially in his shoulder and  upper arm that keep him awake at night.  Although the cramps improved without medication, it worsened his dizziness.  He has cut back the gabapentin to just once daily.  He denies problems with dyspnea or angina at rest or with activity and is a remarkably busy gentleman pacemaker documents 5 or 6 hours of motion every day.  He has not had palpitations.  He denies lower extremity edema (he continues to take very low-dose of furosemide, 20 mg 3 times a week) or claudication or any new focal neurological complaints.  Pacemaker interrogation shows normal findings.  The device is at beginning of life with anticipated generator longevity of over 14 years.  He has 31% atrial pacing and only 0.1% ventricular pacing.  The underlying rhythm is normal sinus with LBBB.  His device records occasional paroxysmal atrial tachycardia, nonsustained, lasting for 10-15 beats (1-2 seconds).  This has occurred only 3 times in the last 3 months.  There has been no true ventricular tachycardia.   Several years ago in 2015 he had a loop recorder implanted for a single episode of syncope.  During 3 years of monitoring no meaningful arrhythmia was found.  The device reached end of service in 2018.  For many years he has had underlying conduction system disease with a very long PR interval and left bundle branch block.  Earlier in 2015 he actually had a right bundle branch block.  Due to his recent complaints of unsteady gait and dizziness he wore an event monitor in May-June.  A single 3.2-second pause was recorded at 8:30 in the morning (usually he wakes up around 6 AM) as far as he recalls that event was asymptomatic.  No further intervention was  recommended.  On 03/12/2020 he had a stroke and was hospitalized at [facility].  His echocardiogram showed normal left ventricular systolic function and no serious valvular abnormalities.  He had moderate LVH with diastolic dysfunction and very mild dilation of the ascending aorta, aortic valve  sclerosis.  It was felt that this stroke was due to small vessel disease and he was prescribed dual antiplatelet for 3 weeks followed by clopidogrel monotherapy which he is still taking.  He most recently had follow-up with Frann Rider, NP in the neurology clinic on April 19, 2020.  He had virtually normal coronary angiography with minimal plaque in 2020.  Intracardiac filling pressures were normal.  The cardiac catheterization was prompted by a large perfusion defect which is actually consistent with LBBB related artifact.  He takes a statin for hypercholesterolemia and his most recent LDL cholesterol was 78.  He does not have diabetes mellitus and has a borderline recent hemoglobin A1c of 5.7%.  He takes a very small dose of loop diuretic (20 mg 3 days a week) for lower extremity edema.  Past Medical History:  Diagnosis Date  . Abnormal nuclear stress test   . Abnormal results of thyroid function studies 01/19/2014   Formatting of this note might be different from the original. August 2016  TSH 4.01  . Acute CVA (cerebrovascular accident) ([place]) 03/12/2020  . Adult body mass index 26.0-26.9 06/07/2013  . Apneic episode 11/29/2020  . Atrial tachycardia ([place]) 01/24/2020  . Atrial tachycardia, paroxysmal ([place]) 08/05/2019  . Bladder tumor   . Bunion of great toe of right foot 09/18/2016  . Cancer of bladder ([place]) 02/10/2013  . Centrilobular emphysema ([place]) 11/26/2020  . Cervical myelopathy ([place]) 01/27/2018  . Cervical stenosis of spine   . CHB (complete heart block) ([place]) 09/28/2020  . Coronary artery disease involving native coronary artery of native heart without angina pectoris 08/05/2019  . CVA (cerebral vascular accident) ([place]) 03/12/2020  . E. coli infection   . Encounter for general adult medical examination without abnormal findings 05/20/2016  . Essential hypertension 03/12/2020  . Exertional dyspnea 01/18/2020  . Gastro-esophageal reflux disease with esophagitis 09/28/2010  . GERD (gastroesophageal  reflux disease)   . History of appendectomy   . History of bladder cancer 03/14/2014  . History of concussion    X2   NO RESIDUALS  . History of peptic ulcer 1970'S  . Hospital discharge follow-up 03/28/2020  . Hyperlipidemia   . Hyponatremia 11/13/2020  . Hypothyroidism (acquired) 03/12/2020  . Hypothyroidism due to Hashimoto's thyroiditis 07/23/2019  . Irregular heart beat   . LBBB (left bundle branch block)   . Mixed hyperlipidemia 11/24/2013   Formatting of this note might be different from the original. History of TIA.  The goal is to have your total cholesterol < 200, the HDL (good cholesterol) >40, and the LDL (bad cholesterol) <100. It is recomended that you follow a good low fat diet and exercise for 30 minutes 3-4 times a week.  . Near syncope 05/16/2014  . Personal history of tobacco use, presenting hazards to health 01/12/2014  . Presence of permanent cardiac pacemaker 11/15/2020  . S/P bilateral cataract extraction 09/18/2016  . Serum reaction due to vaccination 09/27/2011   Formatting of this note might be different from the original. IMPRESSION: seen on Wednesday for reaction to right arm after pneumovax, had redness, warmth , tenderness and edema of right upper arm which is now resolved. monitor area. use warm compresses prn. call prn. discussed will not  need another pneumovax, he will consider zostavax in future.  . 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 Status post placement of implantable loop recorder 11/08/2014  . TIA (transient ischemic attack) 04/09/2014  . Transient cerebral ischemia 01/12/2014   Formatting of this note might be different from the original. IMPRESSION: 8 2015 admitted for TIA -negative CT and MRI.  Echocardiogram was also completed which was normal.  Carotid Dopplers negative for significant stenosis.  Sleep study was normal.    Past Surgical History:  Procedure Laterality Date  . ANTERIOR CERVICAL DECOMP/DISCECTOMY FUSION N/A 01/27/2018    Procedure: Anterior Cervical Discectomy Fusion - Cervical Four- Cervical Five;  Surgeon: Earnie Larsson, MD;  Location: [place];  Service: Neurosurgery;  Laterality: N/A;  Anterior Cervical Discectomy Fusion - Cervical Four- Cervical Five  . APPENDECTOMY  1970  . CARDIOVASCULAR STRESS TEST  03/22/09  . CYSTOSCOPY WITH BIOPSY N/A 02/10/2013   Procedure: CYSTOSCOPY WITH COLD CUP BIOPSY/FULGERATION;  Surgeon: Bernestine Amass, MD;  Location: [facility];  Service: Urology;  Laterality: N/A;  ALSO FULGERATION   . INGUINAL HERNIA REPAIR Right 07-14-2002  . LEFT HEART CATH AND CORONARY ANGIOGRAPHY N/A 07/13/2019   Procedure: LEFT HEART CATH AND CORONARY ANGIOGRAPHY;  Surgeon: Troy Sine, MD;  Location: [place] CV LAB;  Service: Cardiovascular;  Laterality: N/A;  . LOOP RECORDER IMPLANT N/A 05/24/2014   Procedure: LOOP RECORDER IMPLANT;  Surgeon: Sanda Klein, MD;  Location: [place] CATH LAB;  Service: Cardiovascular;  Laterality: N/A;  . LOOP RECORDER REMOVAL N/A 10/02/2020   Procedure: LOOP RECORDER REMOVAL;  Surgeon: Sanda Klein, MD;  Location: [place] CV LAB;  Service: Cardiovascular;  Laterality: N/A;  . NECK SURGERY    . PACEMAKER IMPLANT N/A 10/02/2020   Procedure: PACEMAKER IMPLANT;  Surgeon: Sanda Klein, MD;  Location: [place] CV LAB;  Service: Cardiovascular;  Laterality: N/A;  . TONSILLECTOMY    . TRANSURETHRAL RESECTION OF BLADDER TUMOR  03-10-2008  . TRANSURETHRAL RESECTION OF BLADDER TUMOR Bilateral 12/28/2020   Procedure: TRANSURETHRAL RESECTION OF BLADDER TUMOR BLADDER BIOPSY FULGARATION BILATERAL RETROGRADE PYELOGRAM(TURBT);  Surgeon: Ardis Hughs, MD;  Location: [facility];  Service: Urology;  Laterality: Bilateral;  . TUMOR EXCISION Left    Left arm     Medications Prior to Admission: Prior to Admission medications   Medication Sig Start Date End Date Taking? Authorizing Provider  atorvastatin (LIPITOR) 40 MG tablet Take 1 tablet (40 mg total) by  mouth daily. 03/15/20  Yes Shelly Coss, MD  b complex vitamins capsule Take 1 capsule by mouth daily.   Yes [provider]  clopidogrel (PLAVIX) 75 MG tablet Take 1 tablet (75 mg total) by mouth daily. 04/18/20  Yes Tobb, Kardie, DO  furosemide (LASIX) 20 MG tablet Take 20 mg (1 tablet) on Tuesday, Thursday and Saturdays. Patient taking differently: Take 20 mg by mouth See admin instructions. Take 20 mg (1 tablet) on Tuesday, Thursday and Saturdays. morning 04/12/20  Yes Revankar, Reita Cliche, MD  gabapentin (NEURONTIN) 100 MG capsule Take 1 capsule (100 mg total) by mouth 3 (three) times daily as needed. Patient taking differently: Take 100 mg by mouth at bedtime. 06/09/20  Yes Marcial Pacas, MD  Glucosamine-Chondroit-Vit C-Mn (GLUCOSAMINE 1500 COMPLEX PO) Take 1,500 mg by mouth daily.   Yes [provider]  loratadine (CLARITIN) 10 MG tablet Take 10 mg by mouth daily.   Yes [provider]  Multiple Vitamins-Minerals (MULTIVITAMIN WITH MINERALS) tablet Take 1 tablet by mouth  daily. Dr, Fredirick Lathe   Yes [provider]  Olopatadine HCl 0.2 % SOLN Place 1 drop into both eyes as needed (Dry eye).  03/01/19  Yes [provider]  omeprazole (PRILOSEC) 20 MG capsule Take 20 mg by mouth every morning.   Yes [provider]  Potassium 99 MG TABS Take 99 mg by mouth daily.   Yes [provider]  sertraline (ZOLOFT) 50 MG tablet Take 50 mg by mouth daily. 09/13/20  Yes [provider]  tamsulosin (FLOMAX) 0.4 MG CAPS capsule Take 0.4 mg by mouth daily. 09/22/20  Yes [provider]     Allergies:   No Known Allergies  Social History:   Social History   Socioeconomic History  . Marital status: Married    Spouse name: Not on file  . Number of children: Not on file  . Years of education: Not on file  . Highest education level: Not on file  Occupational History  . Occupation: retired  Tobacco Use  . Smoking status: Former Smoker     Packs/day: 0.50    Years: 30.00    Pack years: 15.00    Types: Cigarettes    Quit date: 02/06/1979    Years since quitting: 41.9  . Smokeless tobacco: Never Used  Vaping Use  . Vaping Use: Never used  Substance and Sexual Activity  . Alcohol use: Yes    Alcohol/week: 0.0 standard drinks    Comment: 1 glass of beer once or twice a month  . Drug use: No  . Sexual activity: Not on file  Other Topics Concern  . Not on file  Social History Narrative  . Not on file   Social Determinants of Health   Financial Resource Strain: Not on file  Food Insecurity: Not on file  Transportation Needs: No Transportation Needs  . Lack of Transportation (Medical): No  . Lack of Transportation (Non-Medical): No  Physical Activity: Not on file  Stress: Not on file  Social Connections: Not on file  Intimate Partner Violence: Not on file    Family History:   The patient's family history includes Diabetes in his brother; Heart failure in his brother and mother; Stroke in his father.    ROS:  Please see the history of present illness.  All other ROS reviewed and negative.     Physical Exam/Data:   Vitals:   01/08/21 0816  BP: 122/70  Pulse: 66  Weight: 145 lb 3.2 oz (65.9 kg)  Height: 5\' 7"  (1.702 m)   No intake or output data in the 24 hours ending 10/02/20 1319 Last 3 Weights 01/08/2021 12/22/2020 12/20/2020  Weight (lbs) 145 lb 3.2 oz 144 lb 145 lb  Weight (kg) 65.862 kg 65.318 kg 65.772 kg     Body mass index is 22.74 kg/m.   General: Alert, oriented x3, no distress, with a left subclavian pacemaker site and the loop recorder explantation site of healed well. Head: no evidence of trauma, PERRL, EOMI, no exophtalmos or lid lag, no myxedema, no xanthelasma; normal ears, nose and oropharynx Neck: normal jugular venous pulsations and no hepatojugular reflux; brisk carotid pulses without delay and no carotid bruits Chest: clear to auscultation, no signs of consolidation by percussion or  palpation, normal fremitus, symmetrical and full respiratory excursions Cardiovascular: normal position and quality of the apical impulse, regular rhythm, normal first and second heart sounds, no murmurs, rubs or gallops Abdomen: no tenderness or distention, no masses by palpation, no abnormal pulsatility or arterial  bruits, normal bowel sounds, no hepatosplenomegaly Extremities: no clubbing, cyanosis or edema; 2+ radial, ulnar and brachial pulses bilaterally; 2+ right femoral, posterior tibial and dorsalis pedis pulses; 2+ left femoral, posterior tibial and dorsalis pedis pulses; no subclavian or femoral bruits Neurological: grossly nonfocal Psych: Normal mood and affect   EKG:  The ECG that was done  was personally reviewed and demonstrates sinus rhythm with first-degree AV block and left bundle branch block  Relevant CV Studies: Loop recorder download September 28, 2020 shows complete heart block  Cardiac cath 2020   Prox RCA lesion is 10% stenosed.  Prox Cx to Mid Cx lesion is 20% stenosed.  Mid Cx lesion is 30% stenosed.  1st Diag lesion is 20% stenosed.  The left ventricular systolic function is normal.  LV end diastolic pressure is normal.  The left ventricular ejection fraction is 50-55% by visual estimate.  There is trivial (1+) mitral regurgitation.  Mild nonobstructive CAD with 20% narrowing in a proximal diagonal branch of the LAD prior to its bifurcation and a otherwise normal LAD system; smooth proximal 20% left circumflex stenosis followed by 30% smooth eccentric mid AV groove stenosis; and mild 10% smooth narrowing in the mid dominant RCA.  Low normal global LV function with EF estimate at 50 to 55% in this patient with chronic left bundle branch block. There appears to be only trivial mitral regurgitation. LVEDP 13 mm Hg.  RECOMMENDATION: Medical therapy for mild nonobstructive CAD.  Echo 03/12/2020 1. Left ventricular ejection fraction, by estimation, is  55 to 60%. The  left ventricle has normal function. The left ventricle has no regional  wall motion abnormalities. There is moderate concentric left ventricular  hypertrophy. Left ventricular  diastolic parameters are consistent with Grade I diastolic dysfunction  (impaired relaxation). Elevated left atrial pressure.  2. Right ventricular systolic function is normal. The right ventricular  size is normal. There is normal pulmonary artery systolic pressure.  3. The mitral valve is normal in structure. Mild mitral valve  regurgitation. No evidence of mitral stenosis.  4. The aortic valve is normal in structure. Aortic valve regurgitation is  not visualized. Mild to moderate aortic valve sclerosis/calcification is  present, without any evidence of aortic stenosis.  5. Aortic dilatation noted. There is mild dilatation of the ascending  aorta measuring 41 mm.  6. The inferior vena cava is normal in size with greater than 50%  respiratory variability, suggesting right atrial pressure of 3 mmHg.   Laboratory Data:  High Sensitivity Troponin:  No results for input(s): TROPONINIHS in the last 720 hours.    Chemistry No results for input(s): NA, K, CL, CO2, GLUCOSE, BUN, CREATININE, CALCIUM, GFRNONAA, GFRAA, ANIONGAP in the last 168 hours.  No results for input(s): PROT, ALBUMIN, AST, ALT, ALKPHOS, BILITOT in the last 168 hours. Hematology No results for input(s): WBC, RBC, HGB, HCT, MCV, MCH, MCHC, RDW, PLT in the last 168 hours. BNPNo results for input(s): BNP, PROBNP in the last 168 hours.  DDimer No results for input(s): DDIMER in the last 168 hours.  05/15/2020 Cholesterol 150, HDL 58, LDL 78, triglycerides 73 10/08/2020  normal liver function tests and normal TSH at 2.877 12/22/2020 Hemoglobin 13.1, creatinine 0.95, potassium 4.5   Radiology/Studies:  No results found.   Assessment and Plan:   1. CHB (complete heart block) ([place])   2. Pacemaker   3. Muscle cramp, nocturnal    4. Unsteady gait   5. History of CVA (cerebrovascular accident)   6. Coronary  artery disease involving native coronary artery of native heart without angina pectoris   7. Hypercholesterolemia      1. Intermittent complete heart block: Only has 0.1% ventricular pacing since device implant in January. 2. Pacemaker: Normal device function.  Enroll in every 35-month remote downloads. 3. Muscle cramps: Gabapentin worsens his dizziness.  Suggested that he try magnesium oxide 400 mg once daily. 4. Gait instability: He does not describe symptoms to suggest arrhythmia or orthostatic hypotension.  Symptoms appear to be neurological.  They are associated with anxiety.  The very brief and very rare episodes of paroxysmal atrial tachycardia recorded by his pacemaker cannot explain his complaint. 5. History of stroke: The loop recorder and the pacemaker have not shown any evidence of atrial fibrillation.  His previous stroke was probably related to small vessel disease. 6. Coronary atherosclerosis: Virtually no progression in coronary plaque on angiograms performed roughly 20 years apart.  Target LDL cholesterol less than 100 appears to be appropriate and he is at that level on his current medications.    For questions or updates, please contact [place] Please consult [email or web address] for contact info under     Signed, Sanda Klein, MD  01/09/2021 6:13 PM

## 2021-01-09 ENCOUNTER — Encounter: Payer: Self-pay | Admitting: Cardiovascular Disease

## 2021-01-15 NOTE — Progress Notes (Signed)
Remote pacemaker transmission.   

## 2021-01-16 ENCOUNTER — Other Ambulatory Visit (HOSPITAL_COMMUNITY): Payer: Medicare Other

## 2021-01-19 ENCOUNTER — Encounter: Payer: Self-pay | Admitting: Pulmonary Disease

## 2021-01-19 ENCOUNTER — Ambulatory Visit (INDEPENDENT_AMBULATORY_CARE_PROVIDER_SITE_OTHER): Payer: Medicare Other | Admitting: Pulmonary Disease

## 2021-01-19 ENCOUNTER — Other Ambulatory Visit: Payer: Self-pay

## 2021-01-19 VITALS — BP 116/72 | HR 71 | Temp 97.6°F | Ht 66.5 in | Wt 146.0 lb

## 2021-01-19 DIAGNOSIS — G4739 Other sleep apnea: Secondary | ICD-10-CM | POA: Diagnosis not present

## 2021-01-19 DIAGNOSIS — J432 Centrilobular emphysema: Secondary | ICD-10-CM | POA: Diagnosis not present

## 2021-01-19 LAB — PULMONARY FUNCTION TEST
DL/VA % pred: 72 %
DL/VA: 2.77 ml/min/mmHg/L
DLCO cor % pred: 72 %
DLCO cor: 15.16 ml/min/mmHg
DLCO unc % pred: 69 %
DLCO unc: 14.48 ml/min/mmHg
FEF 25-75 Post: 1.15 L/sec
FEF 25-75 Pre: 1.28 L/sec
FEF2575-%Change-Post: -9 %
FEF2575-%Pred-Post: 91 %
FEF2575-%Pred-Pre: 101 %
FEV1-%Change-Post: -4 %
FEV1-%Pred-Post: 107 %
FEV1-%Pred-Pre: 112 %
FEV1-Post: 2.25 L
FEV1-Pre: 2.35 L
FEV1FVC-%Change-Post: 0 %
FEV1FVC-%Pred-Pre: 94 %
FEV6-%Change-Post: -3 %
FEV6-%Pred-Post: 119 %
FEV6-%Pred-Pre: 123 %
FEV6-Post: 3.39 L
FEV6-Pre: 3.51 L
FEV6FVC-%Change-Post: 0 %
FEV6FVC-%Pred-Post: 108 %
FEV6FVC-%Pred-Pre: 107 %
FVC-%Change-Post: -3 %
FVC-%Pred-Post: 110 %
FVC-%Pred-Pre: 114 %
FVC-Post: 3.43 L
FVC-Pre: 3.56 L
Post FEV1/FVC ratio: 66 %
Post FEV6/FVC ratio: 99 %
Pre FEV1/FVC ratio: 66 %
Pre FEV6/FVC Ratio: 99 %

## 2021-01-19 NOTE — Progress Notes (Signed)
Synopsis: Referred in January 2022 by Eldridge Abrahams, NP for centrilobular emphysema  Subjective:   PATIENT ID: Oscar [name] GENDER: male DOB: [birth year], MRN: [id number]   HPI  Chief Complaint  Patient presents with  . Follow-up    2 mo f/u after PFT. States his breathing has been stable since last visit.    Oscar [name] is an 87 year old male, former smoker with coronary artery disease, CVA 02/2020 and complete heart block s/p pacemaker 10/02/20 who returns to pulmonary clinic for follow up.   He had sleep study on 12/20/20 that showed moderate obstructive sleep apnea with an AHI of 26.3/hr with predominant obstructive apneas but some central apneas.  His pulmonary function tests today show mild obstructive defect, normal TLC and mild diffusion defect.   He reports his breathing has been stable since last visit. He complains of not being able to build his strength back as easily since his stroke last year. He is not in physical therapy.   OV 11/03/20 He reports since having his stroke last year he has noticed having episodes of feeling anxious inside along with dyspnea. These events can happen at rest or after exertion. He reports his wife has noticed him gasping for air while he sleeps at night and he also reports episodes where he wakes up gasping for air when he falls asleep sitting up watching tv.   CT Chest 09/28/20 shows moderate emphysematous changes, per report. I am not able to view these images.   He smoked for 25 years and quit in 1985. Worked as an Chief Financial Officer for [employer].   Past Medical History:  Diagnosis Date  . Abnormal nuclear stress test   . Abnormal results of thyroid function studies 01/19/2014   Formatting of this note might be different from the original. August 2016  TSH 4.01  . Acute CVA (cerebrovascular accident) ([place]) 03/12/2020  . Adult body mass index 26.0-26.9 06/07/2013  . Apneic episode 11/29/2020  . Atrial tachycardia ([place]) 01/24/2020  . Atrial  tachycardia, paroxysmal ([place]) 08/05/2019  . Bladder tumor   . Bunion of great toe of right foot 09/18/2016  . Cancer of bladder ([place]) 02/10/2013  . Centrilobular emphysema ([place]) 11/26/2020  . Cervical myelopathy ([place]) 01/27/2018  . Cervical stenosis of spine   . CHB (complete heart block) ([place]) 09/28/2020  . Coronary artery disease involving native coronary artery of native heart without angina pectoris 08/05/2019  . CVA (cerebral vascular accident) ([place]) 03/12/2020  . E. coli infection   . Encounter for general adult medical examination without abnormal findings 05/20/2016  . Essential hypertension 03/12/2020  . Exertional dyspnea 01/18/2020  . Gastro-esophageal reflux disease with esophagitis 09/28/2010  . GERD (gastroesophageal reflux disease)   . History of appendectomy   . History of bladder cancer 03/14/2014  . History of concussion    X2   NO RESIDUALS  . History of peptic ulcer 1970'S  . Hospital discharge follow-up 03/28/2020  . Hyperlipidemia   . Hyponatremia 11/13/2020  . Hypothyroidism (acquired) 03/12/2020  . Hypothyroidism due to Hashimoto's thyroiditis 07/23/2019  . Irregular heart beat   . LBBB (left bundle branch block)   . Mixed hyperlipidemia 11/24/2013   Formatting of this note might be different from the original. History of TIA.  The goal is to have your total cholesterol < 200, the HDL (good cholesterol) >40, and the LDL (bad cholesterol) <100. It is recomended that you follow a good low fat diet and exercise for 30 minutes  3-4 times a week.  . Near syncope 05/16/2014  . Personal history of tobacco use, presenting hazards to health 01/12/2014  . Presence of permanent cardiac pacemaker 11/15/2020  . S/P bilateral cataract extraction 09/18/2016  . Serum reaction due to vaccination 09/27/2011   Formatting of this note might be different from the original. IMPRESSION: seen on Wednesday for reaction to right arm after pneumovax, had redness, warmth , tenderness and edema of right upper  arm which is now resolved. monitor area. use warm compresses prn. call prn. discussed will not need another pneumovax, he will consider zostavax in future.  . 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 Status post placement of implantable loop recorder 11/08/2014  . TIA (transient ischemic attack) 04/09/2014  . Transient cerebral ischemia 01/12/2014   Formatting of this note might be different from the original. IMPRESSION: 8 2015 admitted for TIA -negative CT and MRI.  Echocardiogram was also completed which was normal.  Carotid Dopplers negative for significant stenosis.  Sleep study was normal.     Family History  Problem Relation Age of Onset  . Heart failure Mother   . Stroke Father   . Diabetes Brother   . Heart failure Brother      Social History   Socioeconomic History  . Marital status: Married    Spouse name: Not on file  . Number of children: Not on file  . Years of education: Not on file  . Highest education level: Not on file  Occupational History  . Occupation: retired  Tobacco Use  . Smoking status: Former Smoker    Packs/day: 0.50    Years: 30.00    Pack years: 15.00    Types: Cigarettes    Quit date: 02/06/1979    Years since quitting: 41.9  . Smokeless tobacco: Never Used  Vaping Use  . Vaping Use: Never used  Substance and Sexual Activity  . Alcohol use: Yes    Alcohol/week: 0.0 standard drinks    Comment: 1 glass of beer once or twice a month  . Drug use: No  . Sexual activity: Not on file  Other Topics Concern  . Not on file  Social History Narrative  . Not on file   Social Determinants of Health   Financial Resource Strain: Not on file  Food Insecurity: Not on file  Transportation Needs: No Transportation Needs  . Lack of Transportation (Medical): No  . Lack of Transportation (Non-Medical): No  Physical Activity: Not on file  Stress: Not on file  Social Connections: Not on file  Intimate Partner Violence: Not on file      No Known Allergies   Outpatient Medications Prior to Visit  Medication Sig Dispense Refill  . atorvastatin (LIPITOR) 40 MG tablet Take 1 tablet (40 mg total) by mouth daily. 90 tablet 3  . clopidogrel (PLAVIX) 75 MG tablet Take 1 tablet (75 mg total) by mouth daily. Resume 72 hours after bladder surgery 90 tablet 3  . furosemide (LASIX) 20 MG tablet Take 1 tablet (20 mg total) by mouth as needed for edema (May take one tablet 1-2 times a week for swelling). 30 tablet 3  . Glucosamine-Chondroit-Vit C-Mn (GLUCOSAMINE 1500 COMPLEX PO) Take 1,500 mg by mouth daily.    . Magnesium 400 MG CAPS Take 400 mg by mouth daily.    Marland Kitchen omeprazole (PRILOSEC) 20 MG capsule Take 20 mg by mouth every morning.    . sertraline (ZOLOFT) 50 MG tablet Take 50 mg by mouth  daily.    . tamsulosin (FLOMAX) 0.4 MG CAPS capsule Take 0.4 mg by mouth daily.    . Tiotropium Bromide Monohydrate (SPIRIVA RESPIMAT) 2.5 MCG/ACT AERS Inhale 2 puffs into the lungs daily. 4 g 5   Facility-Administered Medications Prior to Visit  Medication Dose Route Frequency Provider Last Rate Last Admin  . lidocaine-EPINEPHrine (XYLOCAINE W/EPI) 1 %-1:100000 (with pres) injection 10 mL  10 mL Infiltration Once Croitoru, Mihai, MD        Review of Systems  Constitutional: Negative for chills, fever, malaise/fatigue and weight loss.  HENT: Negative for congestion, sinus pain and sore throat.   Eyes: Negative.   Respiratory: Positive for shortness of breath. Negative for cough, hemoptysis, sputum production and wheezing.   Cardiovascular: Negative for chest pain, palpitations, orthopnea, claudication and leg swelling.  Gastrointestinal: Negative for abdominal pain, heartburn, nausea and vomiting.  Genitourinary: Negative.   Musculoskeletal: Negative for joint pain and myalgias.  Skin: Negative for rash.  Neurological: Negative for weakness.  Endo/Heme/Allergies: Negative.   Psychiatric/Behavioral: Negative.     Objective:   Vitals:    01/19/21 1009  BP: 116/72  Pulse: 71  Temp: 97.6 F (36.4 C)  TempSrc: Temporal  SpO2: 100%  Weight: 146 lb (66.2 kg)  Height: 5' 6.5" (1.689 m)     Physical Exam Constitutional:      General: He is not in acute distress.    Appearance: Normal appearance. He is not ill-appearing.  HENT:     Head: Normocephalic and atraumatic.  Eyes:     Extraocular Movements: Extraocular movements intact.     Conjunctiva/sclera: Conjunctivae normal.     Pupils: Pupils are equal, round, and reactive to light.  Cardiovascular:     Rate and Rhythm: Normal rate and regular rhythm.     Pulses: Normal pulses.     Heart sounds: Normal heart sounds. No murmur heard.   Pulmonary:     Effort: Pulmonary effort is normal.     Breath sounds: Normal breath sounds. No wheezing or rales.  Abdominal:     General: Bowel sounds are normal.     Palpations: Abdomen is soft.  Musculoskeletal:     Right lower leg: No edema.     Left lower leg: No edema.  Lymphadenopathy:     Cervical: No cervical adenopathy.  Skin:    General: Skin is warm and dry.  Neurological:     General: No focal deficit present.     Mental Status: He is alert.  Psychiatric:        Mood and Affect: Mood normal.        Behavior: Behavior normal.        Thought Content: Thought content normal.        Judgment: Judgment normal.     CBC    Component Value Date/Time   WBC 6.1 12/22/2020 0909   RBC 4.29 12/22/2020 0909   HGB 13.1 12/22/2020 0909   HGB 13.2 09/28/2020 1222   HCT 41.4 12/22/2020 0909   HCT 39.8 09/28/2020 1222   PLT 184 12/22/2020 0909   PLT 234 09/28/2020 1222   MCV 96.5 12/22/2020 0909   MCV 94 09/28/2020 1222   MCH 30.5 12/22/2020 0909   MCHC 31.6 12/22/2020 0909   RDW 12.9 12/22/2020 0909   RDW 12.8 09/28/2020 1222   LYMPHSABS 0.6 (L) 10/08/2020 1157   MONOABS 0.8 10/08/2020 1157   EOSABS 0.2 10/08/2020 1157   BASOSABS 0.0 10/08/2020 1157   BMP Latest Ref  Rng & Units 12/22/2020 11/15/2020 10/08/2020   Glucose 70 - 99 mg/dL 83 80 106(H)  BUN 8 - 23 mg/dL 18 16 19   Creatinine 0.61 - 1.24 mg/dL 0.95 0.99 1.08  BUN/Creat Ratio 10 - 24 - 16 -  Sodium 135 - 145 mmol/L 138 137 136  Potassium 3.5 - 5.1 mmol/L 4.5 4.7 4.4  Chloride 98 - 111 mmol/L 105 100 105  CO2 22 - 32 mmol/L 26 21 23   Calcium 8.9 - 10.3 mg/dL 9.4 9.8 9.0   Chest imaging: CXR 10/08/2020 The heart size and mediastinal contours are within normal limits. Both lungs are clear. No pneumothorax or pleural effusion is noted. Left-sided pacemaker is unchanged in position. The visualized skeletal structures are unremarkable.  PFT: PFT Results Latest Ref Rng & Units 01/19/2021  FVC-Pre L 3.56  FVC-Predicted Pre % 114  FVC-Post L 3.43  FVC-Predicted Post % 110  Pre FEV1/FVC % % 66  Post FEV1/FCV % % 66  FEV1-Pre L 2.35  FEV1-Predicted Pre % 112  FEV1-Post L 2.25  DLCO uncorrected ml/min/mmHg 14.48  DLCO UNC% % 69  DLCO corrected ml/min/mmHg 15.16  DLCO COR %Predicted % 72  DLVA Predicted % 72    Sleep Study 12/20/20 AHI 26.3/hr, 114 total apneas: 97 obstructive, 12 central and 5 mixed apneas  Echo 03/12/20: 1. Left ventricular ejection fraction, by estimation, is 55 to 60%. The  left ventricle has normal function. The left ventricle has no regional  wall motion abnormalities. There is moderate concentric left ventricular  hypertrophy. Left ventricular  diastolic parameters are consistent with Grade I diastolic dysfunction  (impaired relaxation). Elevated left atrial pressure.  2. Right ventricular systolic function is normal. The right ventricular  size is normal. There is normal pulmonary artery systolic pressure.  3. The mitral valve is normal in structure. Mild mitral valve  regurgitation. No evidence of mitral stenosis.  4. The aortic valve is normal in structure. Aortic valve regurgitation is  not visualized. Mild to moderate aortic valve sclerosis/calcification is  present, without any evidence of aortic  stenosis.  5. Aortic dilatation noted. There is mild dilatation of the ascending  aorta measuring 41 mm.  6. The inferior vena cava is normal in size with greater than 50%  respiratory variability, suggesting right atrial pressure of 3 mmHg.  Heart Catheterization 07/13/2019:  Prox RCA lesion is 10% stenosed.  Prox Cx to Mid Cx lesion is 20% stenosed.  Mid Cx lesion is 30% stenosed.  1st Diag lesion is 20% stenosed.  The left ventricular systolic function is normal.  LV end diastolic pressure is normal.  The left ventricular ejection fraction is 50-55% by visual estimate.  There is trivial (1+) mitral regurgitation.    Assessment & Plan:   Centrilobular emphysema (HCC)  Mixed sleep apnea  Discussion: Oscar [name] is an 87 year old male, former smoker with coronary artery disease, CVA 02/2020 and complete heart block s/p pacemaker 10/02/20 who returns to pulmonary clinic for follow up.   He has moderate obstructive sleep apnea with mixed obstructive and central apneas. He does not wish to have a CPAP titration study performed at this time and does not wish to move forward with treating the sleep apnea. Risks of not treating the sleep apnea discussed and he expressed understanding.   He has mild obstruction and mild diffusion defect on PFTs today which is secondary to his emphysema. He is to continue spiriva daily.   I offered to place a pulmonary rehab referral  to help him build his strength back but does not wish to pursue this at this time.   Follow up in 6 months.  Freda Jackson, MD [facility] Office: [phone]   See Amion for Pager Details    Current Outpatient Medications:  .  atorvastatin (LIPITOR) 40 MG tablet, Take 1 tablet (40 mg total) by mouth daily., Disp: 90 tablet, Rfl: 3 .  clopidogrel (PLAVIX) 75 MG tablet, Take 1 tablet (75 mg total) by mouth daily. Resume 72 hours after bladder surgery, Disp: 90 tablet, Rfl: 3 .   furosemide (LASIX) 20 MG tablet, Take 1 tablet (20 mg total) by mouth as needed for edema (May take one tablet 1-2 times a week for swelling)., Disp: 30 tablet, Rfl: 3 .  Glucosamine-Chondroit-Vit C-Mn (GLUCOSAMINE 1500 COMPLEX PO), Take 1,500 mg by mouth daily., Disp: , Rfl:  .  Magnesium 400 MG CAPS, Take 400 mg by mouth daily., Disp: , Rfl:  .  omeprazole (PRILOSEC) 20 MG capsule, Take 20 mg by mouth every morning., Disp: , Rfl:  .  sertraline (ZOLOFT) 50 MG tablet, Take 50 mg by mouth daily., Disp: , Rfl:  .  tamsulosin (FLOMAX) 0.4 MG CAPS capsule, Take 0.4 mg by mouth daily., Disp: , Rfl:  .  Tiotropium Bromide Monohydrate (SPIRIVA RESPIMAT) 2.5 MCG/ACT AERS, Inhale 2 puffs into the lungs daily., Disp: 4 g, Rfl: 5  Current Facility-Administered Medications:  .  lidocaine-EPINEPHrine (XYLOCAINE W/EPI) 1 %-1:100000 (with pres) injection 10 mL, 10 mL, Infiltration, Once, Croitoru, Mihai, MD

## 2021-01-19 NOTE — Progress Notes (Signed)
PFT done today. 

## 2021-01-19 NOTE — Patient Instructions (Signed)
Continue spiriva 2 puffs daily.  You have moderate obstructive sleep apnea. Please consider having CPAP titration study performed if you would like to treat your sleep apnea.   We can place a referral to pulmonary rehab if you would like to work on your strength and endurance.

## 2021-02-21 ENCOUNTER — Telehealth: Payer: Self-pay | Admitting: Pulmonary Disease

## 2021-02-21 DIAGNOSIS — G4739 Other sleep apnea: Secondary | ICD-10-CM

## 2021-02-21 DIAGNOSIS — J432 Centrilobular emphysema: Secondary | ICD-10-CM

## 2021-02-21 NOTE — Telephone Encounter (Signed)
pt daughter is calling because after meeting with PCP they recommended  cpap tritation & pulmonary rehab, pt is wondering if hes able to do that at this time.  Also mentions pt didnt receive RX for albuterol rescue inhaler would like one sent [facility] on [address]. Please advise [phone]  Pt daughter is pts [employer] POA --  [phone]

## 2021-03-01 MED ORDER — ALBUTEROL SULFATE HFA 108 (90 BASE) MCG/ACT IN AERS: 2.0000 | INHALATION_SPRAY | Freq: Four times a day (QID) | RESPIRATORY_TRACT | 6 refills | Status: DC | PRN

## 2021-03-01 NOTE — Telephone Encounter (Signed)
Called and spoke with Oscar [name] letting her know that Dr. Erin Fulling was fine with [place] ordering titration study as well as pulmonary rehab and she verbalized understanding. Orders have been placed. Nothing further needed.

## 2021-03-01 NOTE — Telephone Encounter (Signed)
Lmtcb for pt.  

## 2021-03-01 NOTE — Telephone Encounter (Signed)
Looked at pt's demographics and see that the mobile number that we have listed for pt [phone]) is the same number as pt's daughter [name].     Called and spoke with pt's daughter [name] who states that pt met with PCP and recommended that pt have cpap titration study done as well as being referred to pulmonary rehab. [name] stated that this was mentioned to pt by our office once before but due to pt misunderstanding why we wanted these referrals to happen, pt declined [place] to do these referrals.  Also, [name] stated that Rx for rescue inhaler had not been sent to I have sent that in for pt.  Dr. Erin Fulling, please advise if you are okay with [place] placing order for cpap titration study and also with [place] referring pt to pulmonary rehab.

## 2021-03-05 ENCOUNTER — Encounter (HOSPITAL_COMMUNITY): Payer: Self-pay | Admitting: *Deleted

## 2021-03-05 NOTE — Progress Notes (Signed)
Received referral from Dr. Erin Fulling for this pt to participate in pulmonary rehab with the the diagnosis of Centrolobular Emphysema Clinical review of pt follow up appt on 4/29 Pulmonary office note.  Pt declined at that time for pulmonary rehab referral as well as CPAP titration.  Pt seen by his PCP on Eldridge Abrahams NP at [facility],  reviewed office note and pt has changed his mind and would like to do PR and CPAP titration.  Also reviewed Cardiology follow up with Dr. Sallyanne Kuster on 01/08/21  Pt with Covid Risk Score -7. Pt appropriate for scheduling for Pulmonary rehab.  Will forward to support staff for scheduling when able as there is a wait list  and verification of insurance eligibility/benefits with pt consent. Cherre Huger, BSN Cardiac and Training and development officer

## 2021-03-19 ENCOUNTER — Telehealth (HOSPITAL_COMMUNITY): Payer: Self-pay

## 2021-03-19 NOTE — Telephone Encounter (Signed)
Pt insurance is active and benefits verified through [employer]. Co-pay $0.00, DED $290.00/$290.00 met, out of pocket $3,190.00/$3,190.00 met, co-insurance 20%. No pre-authorization required. Odette F./[employer], 03/16/21 @ 3:54PM, REF#[id number]   Will contact patient to see if he is interested in the Pulmonary Rehab Program. 

## 2021-03-19 NOTE — Telephone Encounter (Signed)
Called and spoke with pt in regards to PR, pt stated he is still not interested at this time.   Closed referral

## 2021-04-02 ENCOUNTER — Ambulatory Visit (INDEPENDENT_AMBULATORY_CARE_PROVIDER_SITE_OTHER): Payer: Medicare Other

## 2021-04-02 DIAGNOSIS — I442 Atrioventricular block, complete: Secondary | ICD-10-CM | POA: Diagnosis not present

## 2021-04-02 LAB — CUP PACEART REMOTE DEVICE CHECK
Battery Remaining Longevity: 164 mo
Battery Voltage: 3.2 V
Brady Statistic AP VP Percent: 0.05 %
Brady Statistic AP VS Percent: 48.66 %
Brady Statistic AS VP Percent: 0.07 %
Brady Statistic AS VS Percent: 51.23 %
Brady Statistic RA Percent Paced: 48.62 %
Brady Statistic RV Percent Paced: 0.12 %
Date Time Interrogation Session: 20220711040505
Implantable Lead Implant Date: 20220110
Implantable Lead Implant Date: 20220110
Implantable Lead Location: 753859
Implantable Lead Location: 753860
Implantable Lead Model: 5076
Implantable Lead Model: 5076
Implantable Pulse Generator Implant Date: 20220110
Lead Channel Impedance Value: 285 Ohm
Lead Channel Impedance Value: 380 Ohm
Lead Channel Impedance Value: 456 Ohm
Lead Channel Impedance Value: 532 Ohm
Lead Channel Pacing Threshold Amplitude: 0.625 V
Lead Channel Pacing Threshold Amplitude: 0.75 V
Lead Channel Pacing Threshold Pulse Width: 0.4 ms
Lead Channel Pacing Threshold Pulse Width: 0.4 ms
Lead Channel Sensing Intrinsic Amplitude: 15.125 mV
Lead Channel Sensing Intrinsic Amplitude: 15.125 mV
Lead Channel Sensing Intrinsic Amplitude: 3.75 mV
Lead Channel Sensing Intrinsic Amplitude: 3.75 mV
Lead Channel Setting Pacing Amplitude: 2 V
Lead Channel Setting Pacing Amplitude: 2.5 V
Lead Channel Setting Pacing Pulse Width: 0.4 ms
Lead Channel Setting Sensing Sensitivity: 1.2 mV

## 2021-04-25 NOTE — Progress Notes (Signed)
Remote pacemaker transmission.   

## 2021-05-03 ENCOUNTER — Ambulatory Visit (HOSPITAL_BASED_OUTPATIENT_CLINIC_OR_DEPARTMENT_OTHER): Payer: Medicare Other | Attending: Pulmonary Disease | Admitting: Pulmonary Disease

## 2021-05-03 DIAGNOSIS — J432 Centrilobular emphysema: Secondary | ICD-10-CM | POA: Diagnosis not present

## 2021-05-03 DIAGNOSIS — G4733 Obstructive sleep apnea (adult) (pediatric): Secondary | ICD-10-CM | POA: Insufficient documentation

## 2021-05-03 DIAGNOSIS — G4739 Other sleep apnea: Secondary | ICD-10-CM

## 2021-05-07 DIAGNOSIS — G4733 Obstructive sleep apnea (adult) (pediatric): Secondary | ICD-10-CM

## 2021-05-07 NOTE — Procedures (Signed)
Patient Name: Oscar [name], Oscar [name] Date: 05/03/2021 Gender: Male D.O.B: 07-26-1931 Age (years): 59 Referring Provider: Freda Jackson Height (inches): 64 Interpreting Physician: Kara Mead MD, ABSM Weight (lbs): 146 RPSGT: Zadie Rhine BMI: 23 MRN: [id number] Neck Size: 13.75 <br> <br> CLINICAL INFORMATION The patient is referred for a CPAP titration to treat sleep apnea.    Date of NPSG:  12/20/20 that showed moderate obstructive sleep apnea with an AHI of 26.3/hr with predominant obstructive apneas   SLEEP STUDY TECHNIQUE As per the AASM Manual for the Scoring of Sleep and Associated Events v2.3 (April 2016) with a hypopnea requiring 4% desaturations.  The channels recorded and monitored were frontal, central and occipital EEG, electrooculogram (EOG), submentalis EMG (chin), nasal and oral airflow, thoracic and abdominal wall motion, anterior tibialis EMG, snore microphone, electrocardiogram, and pulse oximetry. Continuous positive airway pressure (CPAP) was initiated at the beginning of the study and titrated to treat sleep-disordered breathing.  MEDICATIONS Medications self-administered by patient taken the night of the study : N/A  TECHNICIAN COMMENTS Comments added by technician: Pt had three restroom visted. Patient had difficulty initiating sleep. Patient was restless all through the night. Patient had more than two awakenings to use the bathroom  RESPIRATORY PARAMETERS Optimal PAP Pressure (cm): 11 AHI at Optimal Pressure (/hr): 0 Overall Minimal O2 (%): 91.0 Supine % at Optimal Pressure (%): 100 Minimal O2 at Optimal Pressure (%): 93.0   SLEEP ARCHITECTURE The study was initiated at 9:56:39 PM and ended at 4:36:29 AM.  Sleep onset time was 7.8 minutes and the sleep efficiency was 82.0%%. The total sleep time was 328 minutes.  The patient spent 6.6%% of the night in stage N1 sleep, 73.2%% in stage N2 sleep, 0.0%% in stage N3 and 20.3% in REM.Stage REM latency was  85.0 minutes  Wake after sleep onset was 64.0. Alpha intrusion was absent. Supine sleep was 37.31%.  CARDIAC DATA The 2 lead EKG demonstrated sinus rhythm, pacemaker generated. The mean heart rate was 66.2 beats per minute. Other EKG findings include: None.  LEG MOVEMENT DATA The total Periodic Limb Movements of Sleep (PLMS) were 0. The PLMS index was 0.0. A PLMS index of <15 is considered normal in adults.  IMPRESSIONS - The optimal PAP pressure was 11 cm of water. - Central sleep apnea was not noted during this titration (CAI = 0.2/h). - Significant oxygen desaturations were not observed during this titration (min O2 = 91.0%). - No snoring was audible during this study. - No cardiac abnormalities were observed during this study. - Clinically significant periodic limb movements were not noted during this study. Arousals associated with PLMs were not significant. Several limb movements were noted   DIAGNOSIS - Obstructive Sleep Apnea (G47.33)   RECOMMENDATIONS - Trial of CPAP therapy on 11 cm H2O with a Small size Fisher&Paykel Full Face Mask Simplus mask and heated humidification. - Avoid alcohol, sedatives and other CNS depressants that may worsen sleep apnea and disrupt normal sleep architecture. - Sleep hygiene should be reviewed to assess factors that may improve sleep quality. - Weight management and regular exercise should be initiated or continued. - Return to [facility] Center for re-evaluation after 4 weeks of therapy   Kara Mead MD Board Certified in [place]

## 2021-05-11 ENCOUNTER — Other Ambulatory Visit: Payer: Self-pay

## 2021-05-14 ENCOUNTER — Other Ambulatory Visit: Payer: Self-pay

## 2021-05-14 ENCOUNTER — Encounter: Payer: Self-pay | Admitting: Cardiology

## 2021-05-14 ENCOUNTER — Ambulatory Visit (INDEPENDENT_AMBULATORY_CARE_PROVIDER_SITE_OTHER): Payer: Medicare Other | Admitting: Cardiology

## 2021-05-14 VITALS — BP 124/68 | HR 64 | Ht 67.0 in | Wt 148.0 lb

## 2021-05-14 DIAGNOSIS — I447 Left bundle-branch block, unspecified: Secondary | ICD-10-CM

## 2021-05-14 DIAGNOSIS — I10 Essential (primary) hypertension: Secondary | ICD-10-CM | POA: Diagnosis not present

## 2021-05-14 DIAGNOSIS — I2510 Atherosclerotic heart disease of native coronary artery without angina pectoris: Secondary | ICD-10-CM | POA: Diagnosis not present

## 2021-05-14 DIAGNOSIS — E782 Mixed hyperlipidemia: Secondary | ICD-10-CM | POA: Diagnosis not present

## 2021-05-14 DIAGNOSIS — I77810 Thoracic aortic ectasia: Secondary | ICD-10-CM | POA: Diagnosis not present

## 2021-05-14 DIAGNOSIS — Z950 Presence of cardiac pacemaker: Secondary | ICD-10-CM

## 2021-05-14 DIAGNOSIS — G459 Transient cerebral ischemic attack, unspecified: Secondary | ICD-10-CM | POA: Diagnosis not present

## 2021-05-14 DIAGNOSIS — I442 Atrioventricular block, complete: Secondary | ICD-10-CM | POA: Diagnosis not present

## 2021-05-14 NOTE — Patient Instructions (Signed)
Medication Instructions:  No medication changes. *If you need a refill on your cardiac medications before your next appointment, please call your pharmacy*   Lab Work: Your physician recommends that you have labs done in the office today. Your test included  basic metabolic panel, complete blood count, TSH, vitamin D, liver function and lipids.  If you have labs (blood work) drawn today and your tests are completely normal, you will receive your results only by: [place] (if you have MyChart) OR A paper copy in the mail If you have any lab test that is abnormal or we need to change your treatment, we will call you to review the results.   Testing/Procedures: None ordered   Follow-Up: At [facility], you and your health needs are our priority.  As part of our continuing mission to provide you with exceptional heart care, we have created designated Provider Care Teams.  These Care Teams include your primary Cardiologist (physician) and Advanced Practice Providers (APPs -  Physician Assistants and Nurse Practitioners) who all work together to provide you with the care you need, when you need it.  We recommend signing up for the patient portal called "MyChart".  Sign up information is provided on this After Visit Summary.  MyChart is used to connect with patients for Virtual Visits (Telemedicine).  Patients are able to view lab/test results, encounter notes, upcoming appointments, etc.  Non-urgent messages can be sent to your provider as well.   To learn more about what you can do with MyChart, go to [email or web address].    Your next appointment:   6 month(s)  The format for your next appointment:   In Person  Provider:   Jyl Heinz, MD   Other Instructions NA

## 2021-05-14 NOTE — Progress Notes (Signed)
Cardiology Office Note:    Date:  05/14/2021   ID:  MATTOX BUSCAGLIA, DOB 12-Aug-1931, MRN [id number]  PCP:  Berkley Harvey, NP  Cardiologist:  Jenean Lindau, MD   Referring MD: Berkley Harvey, NP    ASSESSMENT:    1. Ascending aorta dilatation (HCC)   2. CHB (complete heart block) (HCC)   3. Coronary artery disease involving native coronary artery of native heart without angina pectoris   4. Essential hypertension   5. LBBB (left bundle branch block)   6. Mixed hyperlipidemia   7. TIA (transient ischemic attack)   8. Presence of permanent cardiac pacemaker    PLAN:    In order of problems listed above:  Coronary artery disease: Secondary prevention stressed with the patient.  Importance of compliance with diet medication stressed and he vocalized understanding. Essential hypertension: Blood pressure stable and diet was emphasized.  Lifestyle modification urged. Mixed dyslipidemia: On statin therapy and we will do blood work today. Post permanent pacemaker: Managed by our electrophysiology colleagues.  I reviewed their notes. Patient will have complete blood work today.Patient will be seen in follow-up appointment in 6 months or earlier if the patient has any concerns   Medication Adjustments/Labs and Tests Ordered: Current medicines are reviewed at length with the patient today.  Concerns regarding medicines are outlined above.  No orders of the defined types were placed in this encounter.  No orders of the defined types were placed in this encounter.    No chief complaint on file.    History of Present Illness:    Oscar [name] is a 87 y.o. male.  Patient has past medical history of essential hypertension, mixed dyslipidemia and permanent pacemaker.  He has ascending aortic dilatation.  He denies any problems at this time and takes care of activities of daily living.  He ambulates age appropriately.  At the time of my evaluation, the patient is alert awake oriented  and in no distress.  Past Medical History:  Diagnosis Date   Abnormal nuclear stress test    Abnormal results of thyroid function studies 01/19/2014   Formatting of this note might be different from the original. August 2016  TSH 4.01   Acute CVA (cerebrovascular accident) ([place]) 03/12/2020   Adult body mass index 26.0-26.9 06/07/2013   Apneic episode 11/29/2020   Atrial tachycardia ([place]) 01/24/2020   Atrial tachycardia, paroxysmal ([place]) 08/05/2019   Bladder tumor    Bunion of great toe of right foot 09/18/2016   Cancer of bladder ([place]) 02/10/2013   Centrilobular emphysema ([place]) 11/26/2020   Cervical myelopathy ([place]) 01/27/2018   Cervical stenosis of spine    CHB (complete heart block) ([place]) 09/28/2020   Coronary artery disease involving native coronary artery of native heart without angina pectoris 08/05/2019   CVA (cerebral vascular accident) ([place]) 03/12/2020   E. coli infection    Encounter for general adult medical examination without abnormal findings 05/20/2016   Essential hypertension 03/12/2020   Exertional dyspnea 01/18/2020   Gastro-esophageal reflux disease with esophagitis 09/28/2010   GERD (gastroesophageal reflux disease)    History of appendectomy    History of bladder cancer 03/14/2014   History of concussion    X2   NO RESIDUALS   History of peptic ulcer 1970'S   Hospital discharge follow-up 03/28/2020   Hyperlipidemia    Hyponatremia 11/13/2020   Hypothyroidism (acquired) 03/12/2020   Hypothyroidism due to Hashimoto's thyroiditis 07/23/2019   Irregular heart beat    LBBB (  left bundle branch block)    Mixed hyperlipidemia 11/24/2013   Formatting of this note might be different from the original. History of TIA.  The goal is to have your total cholesterol < 200, the HDL (good cholesterol) >40, and the LDL (bad cholesterol) <100. It is recomended that you follow a good low fat diet and exercise for 30 minutes 3-4 times a week.   Near syncope 05/16/2014   Personal history of tobacco use,  presenting hazards to health 01/12/2014   Presence of permanent cardiac pacemaker 11/15/2020   S/P bilateral cataract extraction 09/18/2016   Serum reaction due to vaccination 09/27/2011   Formatting of this note might be different from the original. IMPRESSION: seen on Wednesday for reaction to right arm after pneumovax, had redness, warmth , tenderness and edema of right upper arm which is now resolved. monitor area. use warm compresses prn. call prn. discussed will not need another pneumovax, he will consider zostavax in future.   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 IMPRESSION: 8 2015 admitted for TIA -negative CT and MRI.  Echocardiogram was also completed which was normal.  Carotid Dopplers negative for significant stenosis.  Sleep study was normal.    Past Surgical History:  Procedure Laterality Date   ANTERIOR CERVICAL DECOMP/DISCECTOMY FUSION N/A 01/27/2018   Procedure: Anterior Cervical Discectomy Fusion - Cervical Four- Cervical Five;  Surgeon: Earnie Larsson, MD;  Location: [facility];  Service: Neurosurgery;  Laterality: N/A;  Anterior Cervical Discectomy Fusion - Cervical Four- Cervical Five   APPENDECTOMY  1970   CARDIOVASCULAR STRESS TEST  03/22/09   CYSTOSCOPY WITH BIOPSY N/A 02/10/2013   Procedure: CYSTOSCOPY WITH COLD CUP BIOPSY/FULGERATION;  Surgeon: Bernestine Amass, MD;  [facility];  Service: Urology;  Laterality: N/A;  ALSO FULGERATION    INGUINAL HERNIA REPAIR Right 07-14-2002   LEFT HEART CATH AND CORONARY ANGIOGRAPHY N/A 07/13/2019   Procedure: LEFT HEART CATH AND CORONARY ANGIOGRAPHY;  Surgeon: Troy Sine, MD;  Location: [facility];  Service: Cardiovascular;  Laterality: N/A;   LOOP RECORDER IMPLANT N/A 05/24/2014    Procedure: LOOP RECORDER IMPLANT;  Surgeon: Sanda Klein, MD;  Location: [place] [facility];  Service: Cardiovascular;  Laterality: N/A;   LOOP RECORDER REMOVAL N/A 10/02/2020   Procedure: LOOP RECORDER REMOVAL;  Surgeon: Sanda Klein, MD;  Location: [place] [facility];  Service: Cardiovascular;  Laterality: N/A;   NECK SURGERY     PACEMAKER IMPLANT N/A 10/02/2020   Procedure: PACEMAKER IMPLANT;  Surgeon: Sanda Klein, MD;  Location: [facility];  Service: Cardiovascular;  Laterality: N/A;   TONSILLECTOMY     TRANSURETHRAL RESECTION OF BLADDER TUMOR  03-10-2008   TRANSURETHRAL RESECTION OF BLADDER TUMOR Bilateral 12/28/2020   Procedure: TRANSURETHRAL RESECTION OF BLADDER TUMOR BLADDER BIOPSY FULGARATION BILATERAL RETROGRADE PYELOGRAM(TURBT);  Surgeon: Ardis Hughs, MD;  Location: [facility];  Service: Urology;  Laterality: Bilateral;   TUMOR EXCISION Left    Left arm    Current Medications: Current Meds  Medication Sig   albuterol (VENTOLIN HFA) 108 (90 Base) MCG/ACT inhaler Inhale 2 puffs into the lungs every 6 (six) hours as needed for wheezing or shortness of breath.   atorvastatin (LIPITOR) 40 MG tablet Take 1 tablet (40 mg total) by mouth daily.  clopidogrel (PLAVIX) 75 MG tablet Take 1 tablet (75 mg total) by mouth daily. Resume 72 hours after bladder surgery   famotidine (PEPCID) 20 MG tablet Take 20 mg by mouth daily.   furosemide (LASIX) 20 MG tablet Take 1 tablet (20 mg total) by mouth as needed for edema (May take one tablet 1-2 times a week for swelling).   Glucosamine-Chondroit-Vit C-Mn (GLUCOSAMINE 1500 COMPLEX PO) Take 1,500 mg by mouth daily.   Magnesium 400 MG CAPS Take 400 mg by mouth daily.   meclizine (ANTIVERT) 12.5 MG tablet Take 12.5 mg by mouth 3 (three) times daily as needed.   sertraline (ZOLOFT) 50 MG tablet Take 50 mg by mouth daily.   tamsulosin (FLOMAX) 0.4 MG CAPS capsule Take 0.4 mg by mouth daily.   Tiotropium Bromide Monohydrate (SPIRIVA RESPIMAT)  2.5 MCG/ACT AERS Inhale 2 puffs into the lungs daily.   Current Facility-Administered Medications for the 05/14/21 encounter (Office Visit) with Jazzalyn Loewenstein, Reita Cliche, MD  Medication   lidocaine-EPINEPHrine (XYLOCAINE W/EPI) 1 %-1:100000 (with pres) injection 10 mL     Allergies:   Patient has no known allergies.   Social History   Socioeconomic History   Marital status: Married    Spouse name: Not on file   Number of children: Not on file   Years of education: Not on file   Highest education level: Not on file  Occupational History   Occupation: retired  Tobacco Use   Smoking status: Former    Packs/day: 0.50    Years: 30.00    Pack years: 15.00    Types: Cigarettes    Quit date: 02/06/1979    Years since quitting: 42.2   Smokeless tobacco: Never  Vaping Use   Vaping Use: Never used  Substance and Sexual Activity   Alcohol use: Yes    Alcohol/week: 0.0 standard drinks    Comment: 1 glass of beer once or twice a month   Drug use: No   Sexual activity: Not on file  Other Topics Concern   Not on file  Social History Narrative   Not on file   Social Determinants of Health   Financial Resource Strain: Not on file  Food Insecurity: Not on file  Transportation Needs: Not on file  Physical Activity: Not on file  Stress: Not on file  Social Connections: Not on file     Family History: The patient's family history includes Diabetes in his brother; Heart failure in his brother and mother; Stroke in his father.  ROS:   Please see the history of present illness.    All other systems reviewed and are negative.  EKGs/Labs/Other Studies Reviewed:    The following studies were reviewed today: EKG reveals paced rhythm and left bundle branch block.   Recent Labs: 10/08/2020: ALT 19; TSH 2.877 12/22/2020: BUN 18; Creatinine, Ser 0.95; Hemoglobin 13.1; Platelets 184; Potassium 4.5; Sodium 138  Recent Lipid Panel    Component Value Date/Time   CHOL 150 05/15/2020 0843   TRIG  73 05/15/2020 0843   HDL 58 05/15/2020 0843   CHOLHDL 2.6 05/15/2020 0843   CHOLHDL 3.9 03/13/2020 0500   VLDL 19 03/13/2020 0500   LDLCALC 78 05/15/2020 0843    Physical Exam:    VS:  BP 124/68   Pulse 64   Ht '5\' 7"'$  (1.702 m)   Wt 148 lb 0.6 oz (67.2 kg)   BMI 23.19 kg/m     Wt Readings from Last 3 Encounters:  05/14/21 148 lb  0.6 oz (67.2 kg)  05/03/21 146 lb (66.2 kg)  01/19/21 146 lb (66.2 kg)     GEN: Patient is in no acute distress HEENT: Normal NECK: No JVD; No carotid bruits LYMPHATICS: No lymphadenopathy CARDIAC: Hear sounds regular, 2/6 systolic murmur at the apex. RESPIRATORY:  Clear to auscultation without rales, wheezing or rhonchi  ABDOMEN: Soft, non-tender, non-distended MUSCULOSKELETAL:  No edema; No deformity  SKIN: Warm and dry NEUROLOGIC:  Alert and oriented x 3 PSYCHIATRIC:  Normal affect   Signed, Jenean Lindau, MD  05/14/2021 11:31 AM    [place]

## 2021-05-15 LAB — HEPATIC FUNCTION PANEL
ALT: 23 IU/L (ref 0–44)
AST: 27 IU/L (ref 0–40)
Albumin: 4.3 g/dL (ref 3.6–4.6)
Alkaline Phosphatase: 83 IU/L (ref 44–121)
Bilirubin Total: 0.3 mg/dL (ref 0.0–1.2)
Bilirubin, Direct: 0.14 mg/dL (ref 0.00–0.40)
Total Protein: 6.1 g/dL (ref 6.0–8.5)

## 2021-05-15 LAB — BASIC METABOLIC PANEL
BUN/Creatinine Ratio: 17 (ref 10–24)
BUN: 18 mg/dL (ref 8–27)
CO2: 24 mmol/L (ref 20–29)
Calcium: 9.5 mg/dL (ref 8.6–10.2)
Chloride: 101 mmol/L (ref 96–106)
Creatinine, Ser: 1.05 mg/dL (ref 0.76–1.27)
Glucose: 92 mg/dL (ref 65–99)
Potassium: 4.4 mmol/L (ref 3.5–5.2)
Sodium: 138 mmol/L (ref 134–144)
eGFR: 68 mL/min/{1.73_m2} (ref >59)

## 2021-05-15 LAB — CBC WITH DIFFERENTIAL/PLATELET
Basophils Absolute: 0 10*3/uL (ref 0.0–0.2)
Basos: 1 %
EOS (ABSOLUTE): 0.4 10*3/uL (ref 0.0–0.4)
Eos: 7 %
Hematocrit: 38.6 % (ref 37.5–51.0)
Hemoglobin: 12.8 g/dL — ABNORMAL LOW (ref 13.0–17.7)
Immature Grans (Abs): 0 10*3/uL (ref 0.0–0.1)
Immature Granulocytes: 0 %
Lymphocytes Absolute: 0.8 10*3/uL (ref 0.7–3.1)
Lymphs: 15 %
MCH: 30.5 pg (ref 26.6–33.0)
MCHC: 33.2 g/dL (ref 31.5–35.7)
MCV: 92 fL (ref 79–97)
Monocytes Absolute: 0.6 10*3/uL (ref 0.1–0.9)
Monocytes: 11 %
Neutrophils Absolute: 3.4 10*3/uL (ref 1.4–7.0)
Neutrophils: 66 %
Platelets: 207 10*3/uL (ref 150–450)
RBC: 4.19 x10E6/uL (ref 4.14–5.80)
RDW: 12.9 % (ref 11.6–15.4)
WBC: 5.1 10*3/uL (ref 3.4–10.8)

## 2021-05-15 LAB — LIPID PANEL
Chol/HDL Ratio: 2.3 ratio (ref 0.0–5.0)
Cholesterol, Total: 136 mg/dL (ref 100–199)
HDL: 59 mg/dL (ref >39)
LDL Chol Calc (NIH): 64 mg/dL (ref 0–99)
Triglycerides: 63 mg/dL (ref 0–149)
VLDL Cholesterol Cal: 13 mg/dL (ref 5–40)

## 2021-05-15 LAB — TSH: TSH: 1.95 u[IU]/mL (ref 0.450–4.500)

## 2021-05-15 LAB — VITAMIN D 25 HYDROXY (VIT D DEFICIENCY, FRACTURES): Vit D, 25-Hydroxy: 41.5 ng/mL (ref 30.0–100.0)

## 2021-05-17 ENCOUNTER — Other Ambulatory Visit: Payer: Self-pay

## 2021-05-17 DIAGNOSIS — G4733 Obstructive sleep apnea (adult) (pediatric): Secondary | ICD-10-CM

## 2021-05-29 ENCOUNTER — Ambulatory Visit: Payer: Medicare Other | Attending: Nurse Practitioner | Admitting: Physical Therapy

## 2021-05-29 ENCOUNTER — Encounter: Payer: Self-pay | Admitting: Physical Therapy

## 2021-05-29 ENCOUNTER — Other Ambulatory Visit: Payer: Self-pay

## 2021-05-29 DIAGNOSIS — R2689 Other abnormalities of gait and mobility: Secondary | ICD-10-CM | POA: Diagnosis present

## 2021-05-29 DIAGNOSIS — M5442 Lumbago with sciatica, left side: Secondary | ICD-10-CM | POA: Insufficient documentation

## 2021-05-29 DIAGNOSIS — M6281 Muscle weakness (generalized): Secondary | ICD-10-CM | POA: Diagnosis present

## 2021-05-29 DIAGNOSIS — R262 Difficulty in walking, not elsewhere classified: Secondary | ICD-10-CM | POA: Diagnosis present

## 2021-05-29 NOTE — Therapy (Signed)
[place] [place], [place], [address] Phone: [phone]   Fax:  [phone]  Physical Therapy Evaluation  Patient Details  Name: Oscar [name] MRN: [id number] Date of Birth: 12/14/1930 Referring Provider (PT): Eldridge Abrahams   Encounter Date: 05/29/2021   PT End of Session - 05/29/21 1334     Visit Number 1    Date for PT Re-Evaluation 08/28/21    Authorization Type [employer] Medicare    PT Start Time [id number]    PT Stop Time [id number]    PT Time Calculation (min) 46 min    Activity Tolerance Patient tolerated treatment well    Behavior During Therapy [facility] for tasks assessed/performed             Past Medical History:  Diagnosis Date   Abnormal nuclear stress test    Abnormal results of thyroid function studies 01/19/2014   Formatting of this note might be different from the original. August 2016  TSH 4.01   Acute CVA (cerebrovascular accident) ([place]) 03/12/2020   Adult body mass index 26.0-26.9 06/07/2013   Apneic episode 11/29/2020   Atrial tachycardia ([place]) 01/24/2020   Atrial tachycardia, paroxysmal ([place]) 08/05/2019   Bladder tumor    Bunion of great toe of right foot 09/18/2016   Cancer of bladder ([place]) 02/10/2013   Centrilobular emphysema ([place]) 11/26/2020   Cervical myelopathy ([place]) 01/27/2018   Cervical stenosis of spine    CHB (complete heart block) ([place]) 09/28/2020   Coronary artery disease involving native coronary artery of native heart without angina pectoris 08/05/2019   CVA (cerebral vascular accident) ([place]) 03/12/2020   E. coli infection    Encounter for general adult medical examination without abnormal findings 05/20/2016   Essential hypertension 03/12/2020   Exertional dyspnea 01/18/2020   Gastro-esophageal reflux disease with esophagitis 09/28/2010   GERD (gastroesophageal reflux disease)    History of appendectomy    History of bladder cancer 03/14/2014   History of concussion    X2   NO RESIDUALS   History of  peptic ulcer 1970'S   Hospital discharge follow-up 03/28/2020   Hyperlipidemia    Hyponatremia 11/13/2020   Hypothyroidism (acquired) 03/12/2020   Hypothyroidism due to Hashimoto's thyroiditis 07/23/2019   Irregular heart beat    LBBB (left bundle branch block)    Mixed hyperlipidemia 11/24/2013   Formatting of this note might be different from the original. History of TIA.  The goal is to have your total cholesterol < 200, the HDL (good cholesterol) >40, and the LDL (bad cholesterol) <100. It is recomended that you follow a good low fat diet and exercise for 30 minutes 3-4 times a week.   Near syncope 05/16/2014   Personal history of tobacco use, presenting hazards to health 01/12/2014   Presence of permanent cardiac pacemaker 11/15/2020   S/P bilateral cataract extraction 09/18/2016   Serum reaction due to vaccination 09/27/2011   Formatting of this note might be different from the original. IMPRESSION: seen on Wednesday for reaction to right arm after pneumovax, had redness, warmth , tenderness and edema of right upper arm which is now resolved. monitor area. use warm compresses prn. call prn. discussed will not need another pneumovax, he will consider zostavax in future.   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   Status post placement of implantable loop recorder 11/08/2014   TIA (transient ischemic attack) 04/09/2014   Transient cerebral ischemia 01/12/2014   Formatting of this note might be different from  the original. IMPRESSION: 8 2015 admitted for TIA -negative CT and MRI.  Echocardiogram was also completed which was normal.  Carotid Dopplers negative for significant stenosis.  Sleep study was normal.    Past Surgical History:  Procedure Laterality Date   ANTERIOR CERVICAL DECOMP/DISCECTOMY FUSION N/A 01/27/2018   Procedure: Anterior Cervical Discectomy Fusion - Cervical Four- Cervical Five;  Surgeon: Earnie Larsson, MD;  Location: [facility];  Service: Neurosurgery;  Laterality: N/A;   Anterior Cervical Discectomy Fusion - Cervical Four- Cervical Five   APPENDECTOMY  1970   CARDIOVASCULAR STRESS TEST  03/22/09   CYSTOSCOPY WITH BIOPSY N/A 02/10/2013   Procedure: CYSTOSCOPY WITH COLD CUP BIOPSY/FULGERATION;  Surgeon: Bernestine Amass, MD;  Location[facility];  Service: Urology;  Laterality: N/A;  ALSO FULGERATION    INGUINAL HERNIA REPAIR Right 07-14-2002   LEFT HEART CATH AND CORONARY ANGIOGRAPHY N/A 07/13/2019   Procedure: LEFT HEART CATH AND CORONARY ANGIOGRAPHY;  Surgeon: Troy Sine, MD;  Location: [facility];  Service: Cardiovascular;  Laterality: N/A;   LOOP RECORDER IMPLANT N/A 05/24/2014   Procedure: LOOP RECORDER IMPLANT;  Surgeon: Sanda Klein, MD;  Location: [facility];  Service: Cardiovascular;  Laterality: N/A;   LOOP RECORDER REMOVAL N/A 10/02/2020   Procedure: LOOP RECORDER REMOVAL;  Surgeon: Sanda Klein, MD;  Location: [facility];  Service: Cardiovascular;  Laterality: N/A;   NECK SURGERY     PACEMAKER IMPLANT N/A 10/02/2020   Procedure: PACEMAKER IMPLANT;  Surgeon: Sanda Klein, MD;  Location: [facility];  Service: Cardiovascular;  Laterality: N/A;   TONSILLECTOMY     TRANSURETHRAL RESECTION OF BLADDER TUMOR  03-10-2008   TRANSURETHRAL RESECTION OF BLADDER TUMOR Bilateral 12/28/2020   Procedure: TRANSURETHRAL RESECTION OF BLADDER TUMOR BLADDER BIOPSY FULGARATION BILATERAL RETROGRADE PYELOGRAM(TURBT);  Surgeon: Ardis Hughs, MD;  Location: [facility];  Service: Urology;  Laterality: Bilateral;   TUMOR EXCISION Left    Left arm    There were no vitals filed for this visit.    Subjective Assessment - 05/29/21 1253     Subjective Patient reports  that he had a stroke in June 2021, reports that he had a pacemaker implanted in January 2022.  He reports that since that time he has had more and more difficulty walking to the mailbox (75 feet).  He reports that he is also having some increased LBP.  X-rays show  arthritis and stenosis.    Limitations Lifting;House hold activities    How long can you walk comfortably? 75 feet    Patient Stated Goals feel better, less pain, better enduranmce and strength    Currently in Pain? Yes    Pain Score 0-No pain    Pain Location Back    Pain Orientation Lower    Pain Descriptors / Indicators Aching;Sore    Pain Type Acute pain    Pain Radiating Towards pain into the left buttock    Pain Onset More than a month ago    Pain Frequency Intermittent    Aggravating Factors  bending, stooping, lifting pain  up to 5/10    Pain Relieving Factors rest helps pain can be 0/10    Effect of Pain on Daily Activities just "feel like I can';t do much"                [facility] PT Assessment - 05/29/21 0001       Assessment   Medical Diagnosis LBP, weakness    Referring  Provider (PT) Eldridge Abrahams    Onset Date/Surgical Date 04/28/21    Prior Therapy no      Precautions   Precautions ICD/Pacemaker      Balance Screen   Has the patient fallen in the past 6 months Yes    How many times? 1    Has the patient had a decrease in activity level because of a fear of falling?  No    Is the patient reluctant to leave their home because of a fear of falling?  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, some golf but has not played since the stroke      ROM / Strength   AROM / PROM / Strength AROM;Strength      AROM   Overall AROM Comments lumbar flexion WFL's, extension decreased 100%, side bending decreased 75%      Strength   Strength Assessment Site Hip;Knee;Ankle    Right/Left Hip Right;Left    Right Hip Flexion 4-/5    Right Hip Extension 4-/5    Right Hip ABduction 4-/5    Left Hip Flexion 4+/5    Left Hip Extension 4+/5     Left Hip ABduction 4+/5    Right/Left Knee Right;Left    Right Knee Flexion 3+/5    Right Knee Extension 4-/5    Left Knee Flexion 4/5    Left Knee Extension 4/5    Right/Left Ankle Right;Left    Right Ankle Dorsiflexion 4-/5    Right Ankle Inversion 4-/5    Right Ankle Eversion 4-/5    Left Ankle Dorsiflexion 4+/5      Flexibility   Soft Tissue Assessment /Muscle Length yes    Hamstrings tight    Piriformis very tight      Palpation   Palpation comment tight in the lumbar mms, tender in the left buttock      Ambulation/Gait   Gait Comments 300 feet some mild shortness of breath, mild fatigue in the legs      Standardized Balance Assessment   Standardized Balance Assessment Berg Balance Test      Berg Balance Test   Sit to Stand Able to stand without using hands and stabilize independently    Standing Unsupported Able to stand safely 2 minutes    Sitting with Back Unsupported but Feet Supported on Floor or Stool Able to sit safely and securely 2 minutes    Stand to Sit Sits safely with minimal use of hands    Transfers Able to transfer safely, minor use of hands    Standing Unsupported with Eyes Closed Able to stand 10 seconds with supervision    Standing Unsupported with Feet Together Able to place feet together independently and stand 1 minute safely    From Standing, Reach Forward with Outstretched Arm Can reach confidently >25 cm (10")    From Standing Position, Pick up Object from Floor Able to pick up shoe safely and easily    From Standing Position, Turn to Look Behind Over each Shoulder Looks behind one side only/other side shows less weight shift    Turn 360 Degrees Able to turn 360 degrees safely one side only in 4 seconds or less  Standing Unsupported, Alternately Place Feet on Step/Stool Able to stand independently and complete 8 steps >20 seconds    Standing Unsupported, One Foot in Front Needs help to step but can hold 15 seconds    Standing on One Leg Tries to  lift leg/unable to hold 3 seconds but remains standing independently    Total Score 46                        Objective measurements completed on examination: See above findings.                 PT Short Term Goals - 05/29/21 1339       PT SHORT TERM GOAL #1   Title independent with initial HEP    Time 2    Period Weeks    Status New               PT Long Term Goals - 05/29/21 1339       PT LONG TERM GOAL #1   Title understand posture and body mechanics    Time 12    Period Weeks    Status New      PT LONG TERM GOAL #2   Title increase Berg balance test score to 50/56    Time 12    Period Weeks    Status New      PT LONG TERM GOAL #3   Title report no fatigue in the legs walking to the mailbox    Time 12    Period Weeks    Status New      PT LONG TERM GOAL #4   Title report 25% less back pain with ADL's    Time 12    Period Weeks    Status New                    Plan - 05/29/21 1334     Clinical Impression Statement Patient reports a stroke with right side weakness in June 2021, he reports that in January 2022 he had a pacemaker implanted.  He reports that he has had some dizziness and felt off balance at times as well as fatigue in the legs when going to the mailbox (75 feet).  He also reports low back pain and left buttock pain with bending and standing.  Dizziness ocurs when going from sit to stand and with head turns.  Berg balance test score was 46/56 with most difficulty at the higher level.  His right LE is weaker than the left, he was able to walk 300 feet with me in the clinic, did c/o SOB and mild leg fatigue, reports at home much worse with walking the 75 feet to the mailbox    Stability/Clinical Decision Making Stable/Uncomplicated    Clinical Decision Making Low    Rehab Potential Good    PT Frequency 1x / week    PT Duration 12 weeks    PT Treatment/Interventions ADLs/Self Care Home Management;Moist  Heat;Traction;Gait training;Neuromuscular re-education;Balance training;Therapeutic exercise;Therapeutic activities;Functional mobility training;Stair training;Patient/family education;Manual techniques    PT Next Visit Plan start gym activities and balance    Consulted and Agree with Plan of Care Patient             Patient will benefit from skilled therapeutic intervention in order to improve the following deficits and impairments:  Difficulty walking, Decreased range of motion, Cardiopulmonary status limiting activity, Decreased endurance, Decreased activity tolerance, Dizziness, Increased  muscle spasms, Pain, Improper body mechanics, Impaired flexibility, Decreased balance, Decreased mobility, Decreased strength  Visit Diagnosis: Acute bilateral low back pain with left-sided sciatica - Plan: PT plan of care cert/re-cert  Muscle weakness (generalized) - Plan: PT plan of care cert/re-cert  Other abnormalities of gait and mobility - Plan: PT plan of care cert/re-cert  Difficulty in walking, not elsewhere classified - Plan: PT plan of care cert/re-cert     Problem List Patient Active Problem List   Diagnosis Date Noted   Bladder tumor 01/04/2021   Cervical stenosis of spine 01/04/2021   GERD (gastroesophageal reflux disease) 01/04/2021   LBBB (left bundle branch block) 01/04/2021   Ascending aorta dilatation (H[facility]) 12/07/2020   Apneic episode 11/29/2020   Centrilobular emphysema ([place]) 11/26/2020   Presence of permanent cardiac pacemaker 11/15/2020   Hyponatremia 11/13/2020   E. coli infection    History of appendectomy    History of concussion    History of peptic ulcer    Irregular heart beat    CHB (complete heart block) ([place]) 09/28/2020   Hospital discharge follow-up 03/28/2020   CVA (cerebral vascular accident) ([place]) 03/12/2020   Acute CVA (cerebrovascular accident) ([place]) 03/12/2020   Hypothyroidism (acquired) 03/12/2020   Essential hypertension 03/12/2020   Atrial  tachycardia ([place]) 01/24/2020   Exertional dyspnea 01/18/2020   Atrial tachycardia, paroxysmal ([place]) 08/05/2019   Coronary artery disease involving native coronary artery of native heart without angina pectoris 08/05/2019   Hypothyroidism due to Hashimoto's thyroiditis 07/23/2019   Abnormal nuclear stress test    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   Cervical myelopathy ([place]) 01/27/2018   Hyperlipidemia 11/20/2017   Bunion of great toe of right foot 09/18/2016   S/P bilateral cataract extraction 09/18/2016   Encounter for general adult medical examination without abnormal findings 05/20/2016   Status post placement of implantable loop recorder 11/08/2014   Near syncope 05/16/2014   TIA (transient ischemic attack) 04/09/2014   History of bladder cancer 03/14/2014   Abnormal results of thyroid function studies 01/19/2014   Personal history of tobacco use, presenting hazards to health 01/12/2014   Transient cerebral ischemia 01/12/2014   Mixed hyperlipidemia 11/24/2013   Adult body mass index 26.0-26.9 06/07/2013   Cancer of bladder ([place]) 02/10/2013   Serum reaction due to vaccination 09/27/2011   Gastro-esophageal reflux disease with esophagitis 09/28/2010    Sumner Boast., PT 05/29/2021, 1:42 PM  [place] [place], [place], [address] Phone: [phone]   Fax:  [phone]  Name: Oscar [name] MRN: [id number] Date of Birth: Sep 26, 1930

## 2021-05-29 NOTE — Patient Instructions (Signed)
Access Code: [email or web address] URL: [email or web address] Date: 05/29/2021 Prepared by: Lum Babe  Exercises Hooklying Single Knee to Chest Stretch - 1 x daily - 7 x weekly - 2 sets - 10 reps - 10 hold Supine Double Knee to Chest - 1 x daily - 7 x weekly - 2 sets - 10 reps - 10 hold Supine Lower Trunk Rotation - 1 x daily - 7 x weekly - 2 sets - 10 reps - 10 hold Supine Piriformis Stretch Pulling Heel to Hip - 1 x daily - 7 x weekly - 2 sets - 10 reps - 10 hold

## 2021-06-08 ENCOUNTER — Other Ambulatory Visit: Payer: Self-pay

## 2021-06-08 ENCOUNTER — Ambulatory Visit: Payer: Medicare Other | Admitting: Physical Therapy

## 2021-06-08 DIAGNOSIS — M6281 Muscle weakness (generalized): Secondary | ICD-10-CM

## 2021-06-08 DIAGNOSIS — R262 Difficulty in walking, not elsewhere classified: Secondary | ICD-10-CM

## 2021-06-08 DIAGNOSIS — M5442 Lumbago with sciatica, left side: Secondary | ICD-10-CM

## 2021-06-08 NOTE — Therapy (Signed)
[facility]. [place], [place], [address] Phone: [phone]   Fax:  [phone]  Physical Therapy Treatment  Patient Details  Name: Oscar [name] MRN: [id number] Date of Birth: 08/14/1931 Referring Provider (PT): Eldridge Abrahams   Encounter Date: 06/08/2021   PT End of Session - 06/08/21 0912     Visit Number 2    Date for PT Re-Evaluation 08/28/21    Authorization Type U[employer] Medicare    PT Start Time 0845    PT Stop Time 0915    PT Time Calculation (min) 30 min             Past Medical History:  Diagnosis Date   Abnormal nuclear stress test    Abnormal results of thyroid function studies 01/19/2014   Formatting of this note might be different from the original. August 2016  TSH 4.01   Acute CVA (cerebrovascular accident) ([place]) 03/12/2020   Adult body mass index 26.0-26.9 06/07/2013   Apneic episode 11/29/2020   Atrial tachycardia ([place]) 01/24/2020   Atrial tachycardia, paroxysmal ([place]) 08/05/2019   Bladder tumor    Bunion of great toe of right foot 09/18/2016   Cancer of bladder ([place]) 02/10/2013   Centrilobular emphysema ([place]) 11/26/2020   Cervical myelopathy ([place]) 01/27/2018   Cervical stenosis of spine    CHB (complete heart block) ([place]) 09/28/2020   Coronary artery disease involving native coronary artery of native heart without angina pectoris 08/05/2019   CVA (cerebral vascular accident) ([place]) 03/12/2020   E. coli infection    Encounter for general adult medical examination without abnormal findings 05/20/2016   Essential hypertension 03/12/2020   Exertional dyspnea 01/18/2020   Gastro-esophageal reflux disease with esophagitis 09/28/2010   GERD (gastroesophageal reflux disease)    History of appendectomy    History of bladder cancer 03/14/2014   History of concussion    X2   NO RESIDUALS   History of peptic ulcer 1970'S   Hospital discharge follow-up 03/28/2020   Hyperlipidemia    Hyponatremia 11/13/2020    Hypothyroidism (acquired) 03/12/2020   Hypothyroidism due to Hashimoto's thyroiditis 07/23/2019   Irregular heart beat    LBBB (left bundle branch block)    Mixed hyperlipidemia 11/24/2013   Formatting of this note might be different from the original. History of TIA.  The goal is to have your total cholesterol < 200, the HDL (good cholesterol) >40, and the LDL (bad cholesterol) <100. It is recomended that you follow a good low fat diet and exercise for 30 minutes 3-4 times a week.   Near syncope 05/16/2014   Personal history of tobacco use, presenting hazards to health 01/12/2014   Presence of permanent cardiac pacemaker 11/15/2020   S/P bilateral cataract extraction 09/18/2016   Serum reaction due to vaccination 09/27/2011   Formatting of this note might be different from the original. IMPRESSION: seen on Wednesday for reaction to right arm after pneumovax, had redness, warmth , tenderness and edema of right upper arm which is now resolved. monitor area. use warm compresses prn. call prn. discussed will not need another pneumovax, he will consider zostavax in future.   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 IMPRESSION: 8 2015 admitted for TIA -negative CT and MRI.  Echocardiogram was also completed which was  normal.  Carotid Dopplers negative for significant stenosis.  Sleep study was normal.    Past Surgical History:  Procedure Laterality Date   ANTERIOR CERVICAL DECOMP/DISCECTOMY FUSION N/A 01/27/2018   Procedure: Anterior Cervical Discectomy Fusion - Cervical Four- Cervical Five;  Surgeon: Earnie Larsson, MD;  Location: [facility];  Service: Neurosurgery;  Laterality: N/A;  Anterior Cervical Discectomy Fusion - Cervical Four- Cervical Five   APPENDECTOMY  1970   CARDIOVASCULAR  STRESS TEST  03/22/09   CYSTOSCOPY WITH BIOPSY N/A 02/10/2013   Procedure: CYSTOSCOPY WITH COLD CUP BIOPSY/FULGERATION;  Surgeon: Bernestine Amass, MD;  Location: [facility];  Service: Urology;  Laterality: N/A;  ALSO FULGERATION    INGUINAL HERNIA REPAIR Right 07-14-2002   LEFT HEART CATH AND CORONARY ANGIOGRAPHY N/A 07/13/2019   Procedure: LEFT HEART CATH AND CORONARY ANGIOGRAPHY;  Surgeon: Troy Sine, MD;  Location: [facility];  Service: Cardiovascular;  Laterality: N/A;   LOOP RECORDER IMPLANT N/A 05/24/2014   Procedure: LOOP RECORDER IMPLANT;  Surgeon: Sanda Klein, MD;  Location: [facility];  Service: Cardiovascular;  Laterality: N/A;   LOOP RECORDER REMOVAL N/A 10/02/2020   Procedure: LOOP RECORDER REMOVAL;  Surgeon: Sanda Klein, MD;  Location: [facility];  Service: Cardiovascular;  Laterality: N/A;   NECK SURGERY     PACEMAKER IMPLANT N/A 10/02/2020   Procedure: PACEMAKER IMPLANT;  Surgeon: Sanda Klein, MD;  Location: [facility];  Service: Cardiovascular;  Laterality: N/A;   TONSILLECTOMY     TRANSURETHRAL RESECTION OF BLADDER TUMOR  03-10-2008   TRANSURETHRAL RESECTION OF BLADDER TUMOR Bilateral 12/28/2020   Procedure: TRANSURETHRAL RESECTION OF BLADDER TUMOR BLADDER BIOPSY FULGARATION BILATERAL RETROGRADE PYELOGRAM(TURBT);  Surgeon: Ardis Hughs, MD;  Location: [facility];  Service: Urology;  Laterality: Bilateral;   TUMOR EXCISION Left    Left arm    There were no vitals filed for this visit.   Subjective Assessment - 06/08/21 0847     Subjective ex at home are going well    Currently in Pain? Yes    Pain Score 2     Pain Location Back                               [facility] Adult PT Treatment/Exercise - 06/08/21 0001       Exercises   Exercises Lumbar;Knee/Hip      Lumbar Exercises: Aerobic   Nustep L 4 5 min      Lumbar Exercises: Machines for Strengthening   Cybex Lumbar Extension black tband 2  sets 10    Cybex Knee Extension 5# 2 sets 10    Cybex Knee Flexion 15# 2 sets 10    Other Lumbar Machine Exercise lat pull and seated row 20 # 2 sets 10                     PT Education - 06/08/21 0912     Education Details issued HEP for balance alt hip flex,ext and abd. marching fwd and back,side stepping and tandem    Person(s) Educated Patient    Methods Demonstration;Explanation    Comprehension Verbalized understanding;Returned demonstration              PT Short Term Goals - 06/08/21 0914       PT SHORT TERM GOAL #1   Title independent with initial HEP    Status Achieved  PT Long Term Goals - 05/29/21 1339       PT LONG TERM GOAL #1   Title understand posture and body mechanics    Time 12    Period Weeks    Status New      PT LONG TERM GOAL #2   Title increase Berg balance test score to 50/56    Time 12    Period Weeks    Status New      PT LONG TERM GOAL #3   Title report no fatigue in the legs walking to the mailbox    Time 12    Period Weeks    Status New      PT LONG TERM GOAL #4   Title report 25% less back pain with ADL's    Time 12    Period Weeks    Status New                   Plan - 06/08/21 0913     Clinical Impression Statement pt tolerated initial ther ex progression well. added to HEP. STG met. Pt chief complaint is no stamina and at end of session verb issues with dizziness    PT Treatment/Interventions ADLs/Self Care Home Management;Moist Heat;Traction;Gait training;Neuromuscular re-education;Balance training;Therapeutic exercise;Therapeutic activities;Functional mobility training;Stair training;Patient/family education;Manual techniques    PT Next Visit Plan assess HEP,dizziness and stamina             Patient will benefit from skilled therapeutic intervention in order to improve the following deficits and impairments:  Difficulty walking, Decreased range of motion, Cardiopulmonary  status limiting activity, Decreased endurance, Decreased activity tolerance, Dizziness, Increased muscle spasms, Pain, Improper body mechanics, Impaired flexibility, Decreased balance, Decreased mobility, Decreased strength  Visit Diagnosis: Muscle weakness (generalized)  Acute bilateral low back pain with left-sided sciatica  Difficulty in walking, not elsewhere classified     Problem List Patient Active Problem List   Diagnosis Date Noted   Bladder tumor 01/04/2021   Cervical stenosis of spine 01/04/2021   GERD (gastroesophageal reflux disease) 01/04/2021   LBBB (left bundle branch block) 01/04/2021   Ascending aorta dilatation ([facility]) 12/07/2020   Apneic episode 11/29/2020   Centrilobular emphysema ([place]) 11/26/2020   Presence of permanent cardiac pacemaker 11/15/2020   Hyponatremia 11/13/2020   E. coli infection    History of appendectomy    History of concussion    History of peptic ulcer    Irregular heart beat    CHB (complete heart block) ([place]) 09/28/2020   Hospital discharge follow-up 03/28/2020   CVA (cerebral vascular accident) ([place]) 03/12/2020   Acute CVA (cerebrovascular accident) ([place]) 03/12/2020   Hypothyroidism (acquired) 03/12/2020   Essential hypertension 03/12/2020   Atrial tachycardia ([place]) 01/24/2020   Exertional dyspnea 01/18/2020   Atrial tachycardia, paroxysmal ([place]) 08/05/2019   Coronary artery disease involving native coronary artery of native heart without angina pectoris 08/05/2019   Hypothyroidism due to Hashimoto's thyroiditis 07/23/2019   Abnormal nuclear stress test    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   Cervical myelopathy ([place]) 01/27/2018   Hyperlipidemia 11/20/2017   Bunion of great toe of right foot 09/18/2016   S/P bilateral cataract extraction 09/18/2016   Encounter for general adult medical examination without abnormal findings 05/20/2016   Status post placement of implantable loop recorder 11/08/2014   Near  syncope 05/16/2014   TIA (transient ischemic attack) 04/09/2014   History of bladder cancer 03/14/2014   Abnormal results of thyroid function studies 01/19/2014   Personal  history of tobacco use, presenting hazards to health 01/12/2014   Transient cerebral ischemia 01/12/2014   Mixed hyperlipidemia 11/24/2013   Adult body mass index 26.0-26.9 06/07/2013   Cancer of bladder ([place]) 02/10/2013   Serum reaction due to vaccination 09/27/2011   Gastro-esophageal reflux disease with esophagitis 09/28/2010    Barney Gertsch,ANGIE, PTA 06/08/2021, 9:15 AM  [facility]. [place], [place], [address] Phone: [phone]   Fax:  [phone]  Name: RAMAJ FRANGOS MRN: [id number] Date of Birth: November 16, 1930

## 2021-06-12 ENCOUNTER — Ambulatory Visit: Payer: Medicare Other | Admitting: Cardiology

## 2021-06-14 ENCOUNTER — Other Ambulatory Visit: Payer: Self-pay

## 2021-06-14 ENCOUNTER — Ambulatory Visit: Payer: Medicare Other | Admitting: Physical Therapy

## 2021-06-14 DIAGNOSIS — M6281 Muscle weakness (generalized): Secondary | ICD-10-CM

## 2021-06-14 DIAGNOSIS — M5442 Lumbago with sciatica, left side: Secondary | ICD-10-CM | POA: Diagnosis not present

## 2021-06-14 DIAGNOSIS — R262 Difficulty in walking, not elsewhere classified: Secondary | ICD-10-CM

## 2021-06-14 NOTE — Therapy (Signed)
[facility]. [place], [place], [address] Phone: [phone]   Fax:  [phone]  Physical Therapy Treatment  Patient Details  Name: Oscar [name] MRN: [id number] Date of Birth: 1931/05/25 Referring Provider (PT): Eldridge Abrahams   Encounter Date: 06/14/2021   PT End of Session - 06/14/21 1227     Visit Number 3    Date for PT Re-Evaluation 08/28/21    Authorization Type UHC Medicare    PT Start Time 1150    PT Stop Time 8786    PT Time Calculation (min) 40 min             Past Medical History:  Diagnosis Date   Abnormal nuclear stress test    Abnormal results of thyroid function studies 01/19/2014   Formatting of this note might be different from the original. August 2016  TSH 4.01   Acute CVA (cerebrovascular accident) ([place]) 03/12/2020   Adult body mass index 26.0-26.9 06/07/2013   Apneic episode 11/29/2020   Atrial tachycardia ([place]) 01/24/2020   Atrial tachycardia, paroxysmal ([place]) 08/05/2019   Bladder tumor    Bunion of great toe of right foot 09/18/2016   Cancer of bladder ([place]) 02/10/2013   Centrilobular emphysema ([place]) 11/26/2020   Cervical myelopathy ([place]) 01/27/2018   Cervical stenosis of spine    CHB (complete heart block) ([employer]) 09/28/2020   Coronary artery disease involving native coronary artery of native heart without angina pectoris 08/05/2019   CVA (cerebral vascular accident) ([place]) 03/12/2020   E. coli infection    Encounter for general adult medical examination without abnormal findings 05/20/2016   Essential hypertension 03/12/2020   Exertional dyspnea 01/18/2020   Gastro-esophageal reflux disease with esophagitis 09/28/2010   GERD (gastroesophageal reflux disease)    History of appendectomy    History of bladder cancer 03/14/2014   History of concussion    X2   NO RESIDUALS   History of peptic ulcer 1970'S   Hospital discharge follow-up 03/28/2020   Hyperlipidemia    Hyponatremia 11/13/2020    Hypothyroidism (acquired) 03/12/2020   Hypothyroidism due to Hashimoto's thyroiditis 07/23/2019   Irregular heart beat    LBBB (left bundle branch block)    Mixed hyperlipidemia 11/24/2013   Formatting of this note might be different from the original. History of TIA.  The goal is to have your total cholesterol < 200, the HDL (good cholesterol) >40, and the LDL (bad cholesterol) <100. It is recomended that you follow a good low fat diet and exercise for 30 minutes 3-4 times a week.   Near syncope 05/16/2014   Personal history of tobacco use, presenting hazards to health 01/12/2014   Presence of permanent cardiac pacemaker 11/15/2020   S/P bilateral cataract extraction 09/18/2016   Serum reaction due to vaccination 09/27/2011   Formatting of this note might be different from the original. IMPRESSION: seen on Wednesday for reaction to right arm after pneumovax, had redness, warmth , tenderness and edema of right upper arm which is now resolved. monitor area. use warm compresses prn. call prn. discussed will not need another pneumovax, he will consider zostavax in future.   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 IMPRESSION: 8 2015 admitted for TIA -negative CT and MRI.  Echocardiogram was also completed which was  normal.  Carotid Dopplers negative for significant stenosis.  Sleep study was normal.    Past Surgical History:  Procedure Laterality Date   ANTERIOR CERVICAL DECOMP/DISCECTOMY FUSION N/A 01/27/2018   Procedure: Anterior Cervical Discectomy Fusion - Cervical Four- Cervical Five;  Surgeon: Earnie Larsson, MD;  Location: [facility];  Service: Neurosurgery;  Laterality: N/A;  Anterior Cervical Discectomy Fusion - Cervical Four- Cervical Five   APPENDECTOMY  1970   CARDIOVASCULAR  STRESS TEST  03/22/09   CYSTOSCOPY WITH BIOPSY N/A 02/10/2013   Procedure: CYSTOSCOPY WITH COLD CUP BIOPSY/FULGERATION;  Surgeon: Bernestine Amass, MD;  [facility];  Service: Urology;  Laterality: N/A;  ALSO FULGERATION    INGUINAL HERNIA REPAIR Right 07-14-2002   LEFT HEART CATH AND CORONARY ANGIOGRAPHY N/A 07/13/2019   Procedure: LEFT HEART CATH AND CORONARY ANGIOGRAPHY;  Surgeon: Troy Sine, MD;  Location: [facility];  Service: Cardiovascular;  Laterality: N/A;   LOOP RECORDER IMPLANT N/A 05/24/2014   Procedure: LOOP RECORDER IMPLANT;  Surgeon: Sanda Klein, MD;  Location: [facility];  Service: Cardiovascular;  Laterality: N/A;   LOOP RECORDER REMOVAL N/A 10/02/2020   Procedure: LOOP RECORDER REMOVAL;  Surgeon: Sanda Klein, MD;  Location: [facility];  Service: Cardiovascular;  Laterality: N/A;   NECK SURGERY     PACEMAKER IMPLANT N/A 10/02/2020   Procedure: PACEMAKER IMPLANT;  Surgeon: Sanda Klein, MD;  Location: [facility];  Service: Cardiovascular;  Laterality: N/A;   TONSILLECTOMY     TRANSURETHRAL RESECTION OF BLADDER TUMOR  03-10-2008   TRANSURETHRAL RESECTION OF BLADDER TUMOR Bilateral 12/28/2020   Procedure: TRANSURETHRAL RESECTION OF BLADDER TUMOR BLADDER BIOPSY FULGARATION BILATERAL RETROGRADE PYELOGRAM(TURBT);  Surgeon: Ardis Hughs, MD;  Location[facility];  Service: Urology;  Laterality: Bilateral;   TUMOR EXCISION Left    Left arm    There were no vitals filed for this visit.   Subjective Assessment - 06/14/21 1152     Subjective doing okay, balance is off so HEP is just so-so    Currently in Pain? No/denies                               [facility] Adult PT Treatment/Exercise - 06/14/21 0001       Standardized Balance Assessment   Standardized Balance Assessment Berg Balance Test      Berg Balance Test   Sit to Stand Able to stand without using hands and stabilize independently     Standing Unsupported Able to stand safely 2 minutes    Sitting with Back Unsupported but Feet Supported on Floor or Stool Able to sit safely and securely 2 minutes    Stand to Sit Sits safely with minimal use of hands    Transfers Able to transfer safely, minor use of hands    Standing Unsupported with Eyes Closed Able to stand 10 seconds with supervision    Standing Ubsupported with Feet Together Able to place feet together independently and stand for 1 minute with supervision    From Standing, Reach Forward with Outstretched Arm Can reach confidently >25 cm (10")    From Standing Position, Pick up Object from Floor Able to pick up shoe, needs supervision    From Standing Position, Turn to Look Behind Over each Shoulder Looks behind from both sides and weight shifts well    Turn 360 Degrees Able to turn 360 degrees safely in 4 seconds  or less    Standing Unsupported, Alternately Place Feet on Step/Stool Able to complete 4 steps without aid or supervision    Standing Unsupported, One Foot in [place] help to step but can hold 15 seconds    Standing on One Leg Able to lift leg independently and hold equal to or more than 3 seconds    Total Score 46      High Level Balance   High Level Balance Activities Backward walking;Marching forwards;Side stepping   on foam mat,various UE support. CGA with cuing   High Level Balance Comments ball toss on airex,ball kicks   grapevine CGA with cuing     Lumbar Exercises: Aerobic   Other Aerobic Exercise L 2 2 min fwd/2 min backward      Lumbar Exercises: Machines for Strengthening   Cybex Knee Extension 10# 2 sets 10    Cybex Knee Flexion 20# 2 sets 10      Knee/Hip Exercises: Standing   Walking with Sports Cord 30# 5 x fwd and backward, 3 x each side min A    Other Standing Knee Exercises green tband hip 3 way 10 x BIL with UE support                     PT Education - 06/14/21 1217     Education Details reviewed balcne ex     Person(s) Educated Patient    Methods Demonstration;Verbal cues    Comprehension Returned demonstration              PT Short Term Goals - 06/08/21 0914       PT SHORT TERM GOAL #1   Title independent with initial HEP    Status Achieved               PT Long Term Goals - 06/14/21 1217       PT LONG TERM GOAL #1   Title understand posture and body mechanics    Status Partially Met      PT LONG TERM GOAL #2   Title increase Berg balance test score to 50/56    Baseline 46/56    Status Partially Met      PT LONG TERM GOAL #3   Title report no fatigue in the legs walking to the mailbox    Status Partially Met      PT LONG TERM GOAL #4   Title report 25% less back pain with ADL's    Status Achieved                   Plan - 06/14/21 1228     Clinical Impression Statement progressing with goals. BERG increased to 46/56- reviewed HEP and he did well with and after cuing. hip weakness/fatigue noted    PT Treatment/Interventions ADLs/Self Care Home Management;Moist Heat;Traction;Gait training;Neuromuscular re-education;Balance training;Therapeutic exercise;Therapeutic activities;Functional mobility training;Stair training;Patient/family education;Manual techniques    PT Next Visit Plan assess HEP and progress hip ex             Patient will benefit from skilled therapeutic intervention in order to improve the following deficits and impairments:  Difficulty walking, Decreased range of motion, Cardiopulmonary status limiting activity, Decreased endurance, Decreased activity tolerance, Dizziness, Increased muscle spasms, Pain, Improper body mechanics, Impaired flexibility, Decreased balance, Decreased mobility, Decreased strength  Visit Diagnosis: Muscle weakness (generalized)  Difficulty in walking, not elsewhere classified     Problem List Patient Active Problem List   Diagnosis Date Noted  Bladder tumor 01/04/2021   Cervical stenosis of spine  01/04/2021   GERD (gastroesophageal reflux disease) 01/04/2021   LBBB (left bundle branch block) 01/04/2021   Ascending aorta dilatation ([facility]) 12/07/2020   Apneic episode 11/29/2020   Centrilobular emphysema ([place]) 11/26/2020   Presence of permanent cardiac pacemaker 11/15/2020   Hyponatremia 11/13/2020   E. coli infection    History of appendectomy    History of concussion    History of peptic ulcer    Irregular heart beat    CHB (complete heart block) ([place]) 09/28/2020   Hospital discharge follow-up 03/28/2020   CVA (cerebral vascular accident) ([place]) 03/12/2020   Acute CVA (cerebrovascular accident) ([place]) 03/12/2020   Hypothyroidism (acquired) 03/12/2020   Essential hypertension 03/12/2020   Atrial tachycardia ([place]) 01/24/2020   Exertional dyspnea 01/18/2020   Atrial tachycardia, paroxysmal ([place]) 08/05/2019   Coronary artery disease involving native coronary artery of native heart without angina pectoris 08/05/2019   Hypothyroidism due to Hashimoto's thyroiditis 07/23/2019   Abnormal nuclear stress test    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   Cervical myelopathy ([place]) 01/27/2018   Hyperlipidemia 11/20/2017   Bunion of great toe of right foot 09/18/2016   S/P bilateral cataract extraction 09/18/2016   Encounter for general adult medical examination without abnormal findings 05/20/2016   Status post placement of implantable loop recorder 11/08/2014   Near syncope 05/16/2014   TIA (transient ischemic attack) 04/09/2014   History of bladder cancer 03/14/2014   Abnormal results of thyroid function studies 01/19/2014   Personal history of tobacco use, presenting hazards to health 01/12/2014   Transient cerebral ischemia 01/12/2014   Mixed hyperlipidemia 11/24/2013   Adult body mass index 26.0-26.9 06/07/2013   Cancer of bladder ([place]) 02/10/2013   Serum reaction due to vaccination 09/27/2011   Gastro-esophageal reflux disease with esophagitis 09/28/2010     Loan Oguin,ANGIE, PTA 06/14/2021, 12:31 PM  [facility]. [place], [place], [address] Phone: [phone]   Fax:  [phone]  Name: Oscar [name] MRN: [id number] Date of Birth: 1931/08/15

## 2021-06-21 ENCOUNTER — Ambulatory Visit: Payer: Medicare Other | Admitting: Physical Therapy

## 2021-06-28 ENCOUNTER — Other Ambulatory Visit: Payer: Self-pay

## 2021-06-28 ENCOUNTER — Ambulatory Visit: Payer: Medicare Other | Attending: Nurse Practitioner | Admitting: Physical Therapy

## 2021-06-28 ENCOUNTER — Encounter: Payer: Self-pay | Admitting: Physical Therapy

## 2021-06-28 DIAGNOSIS — M5442 Lumbago with sciatica, left side: Secondary | ICD-10-CM | POA: Insufficient documentation

## 2021-06-28 DIAGNOSIS — M6281 Muscle weakness (generalized): Secondary | ICD-10-CM | POA: Diagnosis not present

## 2021-06-28 DIAGNOSIS — R2689 Other abnormalities of gait and mobility: Secondary | ICD-10-CM | POA: Insufficient documentation

## 2021-06-28 DIAGNOSIS — R262 Difficulty in walking, not elsewhere classified: Secondary | ICD-10-CM | POA: Diagnosis present

## 2021-06-28 NOTE — Therapy (Signed)
[address] [place], [place], [address] Phone: [phone]   Fax:  [phone]  Physical Therapy Treatment  Patient Details  Name: Oscar [name] MRN: [id number] Date of Birth: Sep 01, 1931 Referring Provider (PT): Eldridge Abrahams   Encounter Date: 06/28/2021   PT End of Session - 06/28/21 1438     Visit Number 4    Date for PT Re-Evaluation 08/28/21    Authorization Type [employer] Medicare    PT Start Time 8185    PT Stop Time 6314    PT Time Calculation (min) 45 min    Activity Tolerance Patient tolerated treatment well    Behavior During Therapy [facility] for tasks assessed/performed             Past Medical History:  Diagnosis Date   Abnormal nuclear stress test    Abnormal results of thyroid function studies 01/19/2014   Formatting of this note might be different from the original. August 2016  TSH 4.01   Acute CVA (cerebrovascular accident) ([place]) 03/12/2020   Adult body mass index 26.0-26.9 06/07/2013   Apneic episode 11/29/2020   Atrial tachycardia ([place]) 01/24/2020   Atrial tachycardia, paroxysmal ([place]) 08/05/2019   Bladder tumor    Bunion of great toe of right foot 09/18/2016   Cancer of bladder ([place]) 02/10/2013   Centrilobular emphysema ([place]) 11/26/2020   Cervical myelopathy ([place]) 01/27/2018   Cervical stenosis of spine    CHB (complete heart block) ([place]) 09/28/2020   Coronary artery disease involving native coronary artery of native heart without angina pectoris 08/05/2019   CVA (cerebral vascular accident) ([place]) 03/12/2020   E. coli infection    Encounter for general adult medical examination without abnormal findings 05/20/2016   Essential hypertension 03/12/2020   Exertional dyspnea 01/18/2020   Gastro-esophageal reflux disease with esophagitis 09/28/2010   GERD (gastroesophageal reflux disease)    History of appendectomy    History of bladder cancer 03/14/2014   History of concussion    X2   NO RESIDUALS   History of  peptic ulcer 1970'S   Hospital discharge follow-up 03/28/2020   Hyperlipidemia    Hyponatremia 11/13/2020   Hypothyroidism (acquired) 03/12/2020   Hypothyroidism due to Hashimoto's thyroiditis 07/23/2019   Irregular heart beat    LBBB (left bundle branch block)    Mixed hyperlipidemia 11/24/2013   Formatting of this note might be different from the original. History of TIA.  The goal is to have your total cholesterol < 200, the HDL (good cholesterol) >40, and the LDL (bad cholesterol) <100. It is recomended that you follow a good low fat diet and exercise for 30 minutes 3-4 times a week.   Near syncope 05/16/2014   Personal history of tobacco use, presenting hazards to health 01/12/2014   Presence of permanent cardiac pacemaker 11/15/2020   S/P bilateral cataract extraction 09/18/2016   Serum reaction due to vaccination 09/27/2011   Formatting of this note might be different from the original. IMPRESSION: seen on Wednesday for reaction to right arm after pneumovax, had redness, warmth , tenderness and edema of right upper arm which is now resolved. monitor area. use warm compresses prn. call prn. discussed will not need another pneumovax, he will consider zostavax in future.   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   Status post placement of implantable loop recorder 11/08/2014   TIA (transient ischemic attack) 04/09/2014   Transient cerebral ischemia 01/12/2014   Formatting of this note might be different from  the original. IMPRESSION: 8 2015 admitted for TIA -negative CT and MRI.  Echocardiogram was also completed which was normal.  Carotid Dopplers negative for significant stenosis.  Sleep study was normal.    Past Surgical History:  Procedure Laterality Date   ANTERIOR CERVICAL DECOMP/DISCECTOMY FUSION N/A 01/27/2018   Procedure: Anterior Cervical Discectomy Fusion - Cervical Four- Cervical Five;  Surgeon: Earnie Larsson, MD;  Location: [facility];  Service: Neurosurgery;  Laterality: N/A;   Anterior Cervical Discectomy Fusion - Cervical Four- Cervical Five   APPENDECTOMY  1970   CARDIOVASCULAR STRESS TEST  03/22/09   CYSTOSCOPY WITH BIOPSY N/A 02/10/2013   Procedure: CYSTOSCOPY WITH COLD CUP BIOPSY/FULGERATION;  Surgeon: Bernestine Amass, MD;  Location: [facility];  Service: Urology;  Laterality: N/A;  ALSO FULGERATION    INGUINAL HERNIA REPAIR Right 07-14-2002   LEFT HEART CATH AND CORONARY ANGIOGRAPHY N/A 07/13/2019   Procedure: LEFT HEART CATH AND CORONARY ANGIOGRAPHY;  Surgeon: Troy Sine, MD;  Location: [facility] [facility];  Service: Cardiovascular;  Laterality: N/A;   LOOP RECORDER IMPLANT N/A 05/24/2014   Procedure: LOOP RECORDER IMPLANT;  Surgeon: Sanda Klein, MD;  Location: [place];  Service: Cardiovascular;  Laterality: N/A;   LOOP RECORDER REMOVAL N/A 10/02/2020   Procedure: LOOP RECORDER REMOVAL;  Surgeon: Sanda Klein, MD;  Location: [facility];  Service: Cardiovascular;  Laterality: N/A;   NECK SURGERY     PACEMAKER IMPLANT N/A 10/02/2020   Procedure: PACEMAKER IMPLANT;  Surgeon: Sanda Klein, MD;  Location: [facility];  Service: Cardiovascular;  Laterality: N/A;   TONSILLECTOMY     TRANSURETHRAL RESECTION OF BLADDER TUMOR  03-10-2008   TRANSURETHRAL RESECTION OF BLADDER TUMOR Bilateral 12/28/2020   Procedure: TRANSURETHRAL RESECTION OF BLADDER TUMOR BLADDER BIOPSY FULGARATION BILATERAL RETROGRADE PYELOGRAM(TURBT);  Surgeon: Ardis Hughs, MD;  Location: [facility];  Service: Urology;  Laterality: Bilateral;   TUMOR EXCISION Left    Left arm    There were no vitals filed for this visit.   Subjective Assessment - 06/28/21 1355     Subjective Patient reports that his back is feeling a little better, reports worried aobut his balance, he reports that he rode around on the golf [place] and he got dizzy when walking on hole 17.  Reports feels weak in the legs and has difficulty getting the mail from the mailbox, does report  some neck pain    Currently in Pain? No/denies    Pain Score 3     Pain Location Neck    Pain Descriptors / Indicators Sore    Aggravating Factors  he reports that he hears crunching with head motions and reports that he thinks the pain at times makes him dizzy                               [facility] Adult PT Treatment/Exercise - 06/28/21 0001       Ambulation/Gait   Gait Comments outside grass, uneven surfaces, curbs x 200 feet close supervision, we did this 2x      High Level Balance   High Level Balance Comments boll toss off and on the airex, walking ball toss, on airex balance beam tandem walk and side stepping, solid surface head turns side to side and up and down, on airex head turns and then eyes closed      Lumbar Exercises: Aerobic   Nustep L 5 5 min  reports very fatigued after and unsteady      Lumbar Exercises: Machines for Strengthening   Cybex Knee Extension 10# 2 sets 10    Cybex Knee Flexion 20# 2 sets 10                     PT Education - 06/28/21 1437     Education Details balance on and off foam, eyes open, head turns    Person(s) Educated Patient    Methods Explanation;Demonstration;Verbal cues;Handout    Comprehension Verbalized understanding;Returned demonstration;Verbal cues required              PT Short Term Goals - 06/08/21 0914       PT SHORT TERM GOAL #1   Title independent with initial HEP    Status Achieved               PT Long Term Goals - 06/28/21 1441       PT LONG TERM GOAL #1   Title understand posture and body mechanics    Status Partially Met      PT LONG TERM GOAL #2   Title increase Berg balance test score to 50/56    Status Partially Met      PT LONG TERM GOAL #3   Title report no fatigue in the legs walking to the mailbox    Status Partially Met                   Plan - 06/28/21 1439     Clinical Impression Statement Patient comes in and reports that he feels like he  has no stamina and his legs are weak, he reports riding in a cart on the golf course today and getting dizzy at hole #17, he does report that at each hole he would get out and walk on the green.  He does report feeling that tightness and pain in his neck may be the cause, with palpation he does have a lot of tightness here, he did well with the walkin and we went at least 210fet twice without issue, he had reported difficulty to and from the mailbox at aobut 150 feet round trip at home    PT Next Visit Plan may look at the neck, I did give him a little more advanced HEP for balance, he had all the reaction but definitely tends to be back on his heels especially on the dynamic surface    Consulted and Agree with Plan of Care Patient             Patient will benefit from skilled therapeutic intervention in order to improve the following deficits and impairments:  Difficulty walking, Decreased range of motion, Cardiopulmonary status limiting activity, Decreased endurance, Decreased activity tolerance, Dizziness, Increased muscle spasms, Pain, Improper body mechanics, Impaired flexibility, Decreased balance, Decreased mobility, Decreased strength  Visit Diagnosis: Muscle weakness (generalized)  Difficulty in walking, not elsewhere classified  Acute bilateral low back pain with left-sided sciatica  Other abnormalities of gait and mobility     Problem List Patient Active Problem List   Diagnosis Date Noted   Bladder tumor 01/04/2021   Cervical stenosis of spine 01/04/2021   GERD (gastroesophageal reflux disease) 01/04/2021   LBBB (left bundle branch block) 01/04/2021   Ascending aorta dilatation ([place] 12/07/2020   Apneic episode 11/29/2020   Cent[place]ular [place]ysema ([place] 11/26/2020   Presence of permanent cardiac pacemaker 11/15/2020   Hyponatremia 11/13/2020   E. coli infection  History of appendectomy    History of concussion    History of peptic ulcer    Irregular heart beat     CHB (complete heart block) ([place]) 09/28/2020   Hospital discharge follow-up 03/28/2020   CVA (cerebral vascular accident) ([place]) 03/12/2020   Acute CVA (cerebrovascular accident) ([place]) 03/12/2020   Hypothyroidism (acquired) 03/12/2020   Essential hypertension 03/12/2020   Atrial tachycardia ([place]) 01/24/2020   Exertional dyspnea 01/18/2020   Atrial tachycardia, paroxysmal ([place]) 08/05/2019   Coronary artery disease involving native coronary artery of native heart without angina pectoris 08/05/2019   Hypothyroidism due to Hashimoto's thyroiditis 07/23/2019   Abnormal nuclear stress test    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   Cervical myelopathy ([place]) 01/27/2018   Hyperlipidemia 11/20/2017   Bunion of great toe of right foot 09/18/2016   S/P bilateral cataract extraction 09/18/2016   Encounter for general adult medical examination without abnormal findings 05/20/2016   Status post placement of implantable loop recorder 11/08/2014   Near syncope 05/16/2014   TIA (transient ischemic attack) 04/09/2014   History of bladder cancer 03/14/2014   Abnormal results of thyroid function studies 01/19/2014   Personal history of tobacco use, presenting hazards to health 01/12/2014   Transient cerebral ischemia 01/12/2014   Mixed hyperlipidemia 11/24/2013   Adult body mass index 26.0-26.9 06/07/2013   Cancer of bladder ([place]) 02/10/2013   Serum reaction due to vaccination 09/27/2011   Gastro-esophageal reflux disease with esophagitis 09/28/2010    Sumner Boast, PT 06/28/2021, 2:42 PM  [address] [place], [place], [address] Phone: [phone]   Fax:  [phone]  Name: LEMOYNE NESTOR MRN: [id number] Date of Birth: 03/10/1931

## 2021-07-02 ENCOUNTER — Ambulatory Visit (INDEPENDENT_AMBULATORY_CARE_PROVIDER_SITE_OTHER): Payer: Medicare Other

## 2021-07-02 DIAGNOSIS — I442 Atrioventricular block, complete: Secondary | ICD-10-CM | POA: Diagnosis not present

## 2021-07-04 LAB — CUP PACEART REMOTE DEVICE CHECK
Battery Remaining Longevity: 158 mo
Battery Voltage: 3.16 V
Brady Statistic AP VP Percent: 0.06 %
Brady Statistic AP VS Percent: 63.69 %
Brady Statistic AS VP Percent: 0.03 %
Brady Statistic AS VS Percent: 36.22 %
Brady Statistic RA Percent Paced: 63.56 %
Brady Statistic RV Percent Paced: 0.1 %
Date Time Interrogation Session: 20221009201126
Implantable Lead Implant Date: 20220110
Implantable Lead Implant Date: 20220110
Implantable Lead Location: 753859
Implantable Lead Location: 753860
Implantable Lead Model: 5076
Implantable Lead Model: 5076
Implantable Pulse Generator Implant Date: 20220110
Lead Channel Impedance Value: 285 Ohm
Lead Channel Impedance Value: 380 Ohm
Lead Channel Impedance Value: 437 Ohm
Lead Channel Impedance Value: 494 Ohm
Lead Channel Pacing Threshold Amplitude: 0.625 V
Lead Channel Pacing Threshold Amplitude: 0.625 V
Lead Channel Pacing Threshold Pulse Width: 0.4 ms
Lead Channel Pacing Threshold Pulse Width: 0.4 ms
Lead Channel Sensing Intrinsic Amplitude: 14.125 mV
Lead Channel Sensing Intrinsic Amplitude: 14.125 mV
Lead Channel Sensing Intrinsic Amplitude: 2.875 mV
Lead Channel Sensing Intrinsic Amplitude: 2.875 mV
Lead Channel Setting Pacing Amplitude: 2 V
Lead Channel Setting Pacing Amplitude: 2.5 V
Lead Channel Setting Pacing Pulse Width: 0.4 ms
Lead Channel Setting Sensing Sensitivity: 1.2 mV

## 2021-07-11 NOTE — Progress Notes (Signed)
Remote pacemaker transmission.   

## 2021-09-19 ENCOUNTER — Emergency Department (HOSPITAL_BASED_OUTPATIENT_CLINIC_OR_DEPARTMENT_OTHER)
Admission: EM | Admit: 2021-09-19 | Discharge: 2021-09-19 | Disposition: A | Discharge status: Home / Self Care | Payer: Medicare Other | Attending: Emergency Medicine | Admitting: Emergency Medicine

## 2021-09-19 ENCOUNTER — Other Ambulatory Visit: Payer: Self-pay

## 2021-09-19 ENCOUNTER — Emergency Department (HOSPITAL_BASED_OUTPATIENT_CLINIC_OR_DEPARTMENT_OTHER): Payer: Medicare Other

## 2021-09-19 ENCOUNTER — Encounter (HOSPITAL_BASED_OUTPATIENT_CLINIC_OR_DEPARTMENT_OTHER): Payer: Self-pay

## 2021-09-19 DIAGNOSIS — Z8551 Personal history of malignant neoplasm of bladder: Secondary | ICD-10-CM | POA: Diagnosis not present

## 2021-09-19 DIAGNOSIS — E039 Hypothyroidism, unspecified: Secondary | ICD-10-CM | POA: Insufficient documentation

## 2021-09-19 DIAGNOSIS — R112 Nausea with vomiting, unspecified: Secondary | ICD-10-CM | POA: Insufficient documentation

## 2021-09-19 DIAGNOSIS — I2510 Atherosclerotic heart disease of native coronary artery without angina pectoris: Secondary | ICD-10-CM | POA: Insufficient documentation

## 2021-09-19 DIAGNOSIS — Z79899 Other long term (current) drug therapy: Secondary | ICD-10-CM | POA: Diagnosis not present

## 2021-09-19 DIAGNOSIS — R1013 Epigastric pain: Secondary | ICD-10-CM | POA: Insufficient documentation

## 2021-09-19 DIAGNOSIS — I10 Essential (primary) hypertension: Secondary | ICD-10-CM | POA: Diagnosis not present

## 2021-09-19 DIAGNOSIS — Z87891 Personal history of nicotine dependence: Secondary | ICD-10-CM | POA: Insufficient documentation

## 2021-09-19 DIAGNOSIS — K449 Diaphragmatic hernia without obstruction or gangrene: Secondary | ICD-10-CM

## 2021-09-19 LAB — COMPREHENSIVE METABOLIC PANEL
ALT: 14 U/L (ref 0–44)
AST: 17 U/L (ref 15–41)
Albumin: 3.5 g/dL (ref 3.5–5.0)
Alkaline Phosphatase: 63 U/L (ref 38–126)
Anion gap: 8 (ref 5–15)
BUN: 25 mg/dL — ABNORMAL HIGH (ref 8–23)
CO2: 24 mmol/L (ref 22–32)
Calcium: 8.8 mg/dL — ABNORMAL LOW (ref 8.9–10.3)
Chloride: 104 mmol/L (ref 98–111)
Creatinine, Ser: 0.88 mg/dL (ref 0.61–1.24)
GFR, Estimated: >60 mL/min (ref >60)
Glucose, Bld: 99 mg/dL (ref 70–99)
Potassium: 4.7 mmol/L (ref 3.5–5.1)
Sodium: 136 mmol/L (ref 135–145)
Total Bilirubin: 0.2 mg/dL — ABNORMAL LOW (ref 0.3–1.2)
Total Protein: 6 g/dL — ABNORMAL LOW (ref 6.5–8.1)

## 2021-09-19 LAB — LIPASE, BLOOD: Lipase: 38 U/L (ref 11–51)

## 2021-09-19 LAB — URINALYSIS, MICROSCOPIC (REFLEX): WBC, UA: NONE SEEN WBC/hpf (ref 0–5)

## 2021-09-19 LAB — URINALYSIS, ROUTINE W REFLEX MICROSCOPIC
Bilirubin Urine: NEGATIVE
Glucose, UA: NEGATIVE mg/dL
Ketones, ur: NEGATIVE mg/dL
Leukocytes,Ua: NEGATIVE
Nitrite: NEGATIVE
Protein, ur: NEGATIVE mg/dL
Specific Gravity, Urine: >=1.03 (ref 1.005–1.030)
pH: 5.5 (ref 5.0–8.0)

## 2021-09-19 LAB — CBC
HCT: 34.9 % — ABNORMAL LOW (ref 39.0–52.0)
Hemoglobin: 11 g/dL — ABNORMAL LOW (ref 13.0–17.0)
MCH: 28.9 pg (ref 26.0–34.0)
MCHC: 31.5 g/dL (ref 30.0–36.0)
MCV: 91.6 fL (ref 80.0–100.0)
Platelets: 275 10*3/uL (ref 150–400)
RBC: 3.81 MIL/uL — ABNORMAL LOW (ref 4.22–5.81)
RDW: 14 % (ref 11.5–15.5)
WBC: 7.2 10*3/uL (ref 4.0–10.5)
nRBC: 0 % (ref 0.0–0.2)

## 2021-09-19 MED ORDER — IOHEXOL 300 MG/ML  SOLN
100.0000 mL | Freq: Once | INTRAMUSCULAR | Status: AC | PRN
  Administered 2021-09-19: 22:00:00 100 mL via INTRAVENOUS

## 2021-09-19 MED ORDER — MAALOX MAX 400-400-40 MG/5ML PO SUSP: 5.0000 mL | Freq: Four times a day (QID) | ORAL | 0 refills | Status: DC | PRN

## 2021-09-19 MED ORDER — POLYETHYLENE GLYCOL 3350 17 G PO PACK: 17.0000 g | PACK | Freq: Every day | ORAL | 0 refills | Status: DC

## 2021-09-19 NOTE — Discharge Instructions (Addendum)
You have been seen and discharged from the emergency department.  You are found to have a hiatal hernia which I believe is the source of your symptoms.  There is also mild constipation.  Use new medications as directed.  Follow-up with a GI doctor for further evaluation and more definitive care.  Also of note there was artifact on the lower part of your abdominal skin around the left femoral area.  If you ever have any left lower extremity pain, discoloration please be evaluated.  I believe this is all artifact and not genuine, the blood flow in your legs is normal today.  Follow-up with your primary provider for reevaluation and further care. Take home medications as prescribed. If you have any worsening symptoms or further concerns for your health please return to an emergency department for further evaluation.

## 2021-09-19 NOTE — ED Provider Notes (Signed)
[facility] EMERGENCY DEPARTMENT Provider Note   CSN: [id number] Arrival date & time: 09/19/21  1637     History Chief Complaint  Patient presents with   Abdominal Pain    Oscar [name] is a 87 y.o. male.  HPI  87 year old male with past medical history of left bundle branch block, heart block with pacemaker in place, CVA, HTN, HLD, CAD presents emergency department epigastric abdominal pain and intermittent nausea/vomiting.  Patient states that his symptoms of been going on intermittently for the past month.  It is not daily, usually self resolves.  He denies any diarrhea.  He said a slight decrease in appetite, no recent fever.  Denies any chest pain, shortness of breath.  No lower extremity swelling.  Past Medical History:  Diagnosis Date   Abnormal nuclear stress test    Abnormal results of thyroid function studies 01/19/2014   Formatting of this note might be different from the original. August 2016  TSH 4.01   Acute CVA (cerebrovascular accident) ([place]) 03/12/2020   Adult body mass index 26.0-26.9 06/07/2013   Apneic episode 11/29/2020   Atrial tachycardia ([place]) 01/24/2020   Atrial tachycardia, paroxysmal ([place]) 08/05/2019   Bladder tumor    Bunion of great toe of right foot 09/18/2016   Cancer of bladder ([place]) 02/10/2013   Centrilobular emphysema ([place]) 11/26/2020   Cervical myelopathy ([place]) 01/27/2018   Cervical stenosis of spine    CHB (complete heart block) ([place]) 09/28/2020   Coronary artery disease involving native coronary artery of native heart without angina pectoris 08/05/2019   CVA (cerebral vascular accident) ([place]) 03/12/2020   E. coli infection    Encounter for general adult medical examination without abnormal findings 05/20/2016   Essential hypertension 03/12/2020   Exertional dyspnea 01/18/2020   Gastro-esophageal reflux disease with esophagitis 09/28/2010   GERD (gastroesophageal reflux disease)    History of appendectomy    History of bladder cancer  03/14/2014   History of concussion    X2   NO RESIDUALS   History of peptic ulcer 1970'S   Hospital discharge follow-up 03/28/2020   Hyperlipidemia    Hyponatremia 11/13/2020   Hypothyroidism (acquired) 03/12/2020   Hypothyroidism due to Hashimoto's thyroiditis 07/23/2019   Irregular heart beat    LBBB (left bundle branch block)    Mixed hyperlipidemia 11/24/2013   Formatting of this note might be different from the original. History of TIA.  The goal is to have your total cholesterol < 200, the HDL (good cholesterol) >40, and the LDL (bad cholesterol) <100. It is recomended that you follow a good low fat diet and exercise for 30 minutes 3-4 times a week.   Near syncope 05/16/2014   Personal history of tobacco use, presenting hazards to health 01/12/2014   Presence of permanent cardiac pacemaker 11/15/2020   S/P bilateral cataract extraction 09/18/2016   Serum reaction due to vaccination 09/27/2011   Formatting of this note might be different from the original. IMPRESSION: seen on Wednesday for reaction to right arm after pneumovax, had redness, warmth , tenderness and edema of right upper arm which is now resolved. monitor area. use warm compresses prn. call prn. discussed will not need another pneumovax, he will consider zostavax in future.   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 IMPRESSION: 8 2015  admitted for TIA -negative CT and MRI.  Echocardiogram was also completed which was normal.  Carotid Dopplers negative for significant stenosis.  Sleep study was normal.    Patient Active Problem List   Diagnosis Date Noted   Bladder tumor 01/04/2021   Cervical stenosis of spine 01/04/2021   GERD (gastroesophageal reflux disease) 01/04/2021   LBBB (left bundle branch  block) 01/04/2021   Ascending aorta dilatation ([facility]) 12/07/2020   Apneic episode 11/29/2020   Centrilobular emphysema ([place]) 11/26/2020   Presence of permanent cardiac pacemaker 11/15/2020   Hyponatremia 11/13/2020   E. coli infection    History of appendectomy    History of concussion    History of peptic ulcer    Irregular heart beat    CHB (complete heart block) ([place]) 09/28/2020   Hospital discharge follow-up 03/28/2020   CVA (cerebral vascular accident) ([place]) 03/12/2020   Acute CVA (cerebrovascular accident) ([place]) 03/12/2020   Hypothyroidism (acquired) 03/12/2020   Essential hypertension 03/12/2020   Atrial tachycardia ([place]) 01/24/2020   Exertional dyspnea 01/18/2020   Atrial tachycardia, paroxysmal ([place]) 08/05/2019   Coronary artery disease involving native coronary artery of native heart without angina pectoris 08/05/2019   Hypothyroidism due to Hashimoto's thyroiditis 07/23/2019   Abnormal nuclear stress test    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   Cervical myelopathy ([place]) 01/27/2018   Hyperlipidemia 11/20/2017   Bunion of great toe of right foot 09/18/2016   S/P bilateral cataract extraction 09/18/2016   Encounter for general adult medical examination without abnormal findings 05/20/2016   Status post placement of implantable loop recorder 11/08/2014   Near syncope 05/16/2014   TIA (transient ischemic attack) 04/09/2014   History of bladder cancer 03/14/2014   Abnormal results of thyroid function studies 01/19/2014   Personal history of tobacco use, presenting hazards to health 01/12/2014   Transient cerebral ischemia 01/12/2014   Mixed hyperlipidemia 11/24/2013   Adult body mass index 26.0-26.9 06/07/2013   Cancer of bladder ([place]) 02/10/2013   Serum reaction due to vaccination 09/27/2011   Gastro-esophageal reflux disease with esophagitis 09/28/2010    Past Surgical History:  Procedure Laterality Date   ANTERIOR CERVICAL DECOMP/DISCECTOMY  FUSION N/A 01/27/2018   Procedure: Anterior Cervical Discectomy Fusion - Cervical Four- Cervical Five;  Surgeon: Earnie Larsson, MD;  Location: [facility];  Service: Neurosurgery;  Laterality: N/A;  Anterior Cervical Discectomy Fusion - Cervical Four- Cervical Five   APPENDECTOMY  1970   CARDIOVASCULAR STRESS TEST  03/22/09   CYSTOSCOPY WITH BIOPSY N/A 02/10/2013   Procedure: CYSTOSCOPY WITH COLD CUP BIOPSY/FULGERATION;  Surgeon: Bernestine Amass, MD;  Location: [facility];  Service: Urology;  Laterality: N/A;  ALSO FULGERATION    INGUINAL HERNIA REPAIR Right 07-14-2002   LEFT HEART CATH AND CORONARY ANGIOGRAPHY N/A 07/13/2019   Procedure: LEFT HEART CATH AND CORONARY ANGIOGRAPHY;  Surgeon: Troy Sine, MD;  Location: [facility];  Service: Cardiovascular;  Laterality: N/A;   LOOP RECORDER IMPLANT N/A 05/24/2014   Procedure: LOOP RECORDER IMPLANT;  Surgeon: Sanda Klein, MD;  Location: [facility];  Service: Cardiovascular;  Laterality: N/A;   LOOP RECORDER REMOVAL N/A 10/02/2020   Procedure: LOOP RECORDER REMOVAL;  Surgeon: Sanda Klein, MD;  Location: [facility];  Service: Cardiovascular;  Laterality: N/A;   NECK SURGERY     PACEMAKER IMPLANT N/A 10/02/2020   Procedure: PACEMAKER IMPLANT;  Surgeon: Sanda Klein, MD;  Location: [facility];  Service: Cardiovascular;  Laterality: N/A;  TONSILLECTOMY     TRANSURETHRAL RESECTION OF BLADDER TUMOR  03-10-2008   TRANSURETHRAL RESECTION OF BLADDER TUMOR Bilateral 12/28/2020   Procedure: TRANSURETHRAL RESECTION OF BLADDER TUMOR BLADDER BIOPSY FULGARATION BILATERAL RETROGRADE PYELOGRAM(TURBT);  Surgeon: Ardis Hughs, MD;  Location: [facility];  Service: Urology;  Laterality: Bilateral;   TUMOR EXCISION Left    Left arm       Family History  Problem Relation Age of Onset   Heart failure Mother    Stroke Father    Diabetes Brother    Heart failure Brother     Social History   Tobacco Use   Smoking status:  Former    Packs/day: 0.50    Years: 30.00    Pack years: 15.00    Types: Cigarettes    Quit date: 02/06/1979    Years since quitting: 42.6   Smokeless tobacco: Never  Vaping Use   Vaping Use: Never used  Substance Use Topics   Alcohol use: Yes    Comment: occ   Drug use: No    Home Medications Prior to Admission medications   Medication Sig Start Date End Date Taking? Authorizing Provider  albuterol (VENTOLIN HFA) 108 (90 Base) MCG/ACT inhaler Inhale 2 puffs into the lungs every 6 (six) hours as needed for wheezing or shortness of breath. 03/01/21   Freddi Starr, MD  atorvastatin (LIPITOR) 40 MG tablet Take 1 tablet (40 mg total) by mouth daily. 11/27/20   Revankar, Reita Cliche, MD  clopidogrel (PLAVIX) 75 MG tablet Take 1 tablet (75 mg total) by mouth daily. Resume 72 hours after bladder surgery 12/28/20   Ardis Hughs, MD  famotidine (PEPCID) 20 MG tablet Take 20 mg by mouth daily.    [provider]  furosemide (LASIX) 20 MG tablet Take 1 tablet (20 mg total) by mouth as needed for edema (May take one tablet 1-2 times a week for swelling). 01/08/21   Croitoru, Mihai, MD  Glucosamine-Chondroit-Vit C-Mn (GLUCOSAMINE 1500 COMPLEX PO) Take 1,500 mg by mouth daily.    [provider]  Magnesium 400 MG CAPS Take 400 mg by mouth daily. 01/08/21   Croitoru, Mihai, MD  meclizine (ANTIVERT) 12.5 MG tablet Take 12.5 mg by mouth 3 (three) times daily as needed. 04/15/21   [provider]  sertraline (ZOLOFT) 50 MG tablet Take 50 mg by mouth daily. 09/13/20   [provider]  tamsulosin (FLOMAX) 0.4 MG CAPS capsule Take 0.4 mg by mouth daily. 09/22/20   [provider]  Tiotropium Bromide Monohydrate (SPIRIVA RESPIMAT) 2.5 MCG/ACT AERS Inhale 2 puffs into the lungs daily. 12/27/20   Freddi Starr, MD    Allergies    Patient has no known allergies.  Review of Systems   Review of Systems  Constitutional:  Positive for appetite change and  fatigue. Negative for chills and fever.  HENT:  Negative for congestion.   Eyes:  Negative for visual disturbance.  Respiratory:  Negative for shortness of breath.   Cardiovascular:  Negative for chest pain.  Gastrointestinal:  Positive for abdominal pain, nausea and vomiting. Negative for blood in stool and diarrhea.  Genitourinary:  Negative for dysuria.  Skin:  Negative for rash.  Neurological:  Negative for headaches.   Physical Exam Updated Vital Signs BP (!) 157/103    Pulse 69    Temp (!) 97.5 F (36.4 C) (Oral)    Resp 18    Ht 5\' 7"  (1.702 m)    Wt 64.9 kg  SpO2 99%    BMI 22.40 kg/m   Physical Exam Vitals and nursing note reviewed.  Constitutional:      General: He is not in acute distress.    Appearance: Normal appearance. He is not ill-appearing.  HENT:     Head: Normocephalic.     Mouth/Throat:     Mouth: Mucous membranes are moist.  Cardiovascular:     Rate and Rhythm: Normal rate.  Pulmonary:     Effort: Pulmonary effort is normal. No respiratory distress.  Abdominal:     General: Bowel sounds are normal.     Palpations: Abdomen is soft.     Tenderness: There is abdominal tenderness in the epigastric area. There is no guarding or rebound.  Musculoskeletal:     Comments: Equal palpable DP pulses, no foot discoloration, no lower extremity swelling  Skin:    General: Skin is warm.  Neurological:     Mental Status: He is alert and oriented to person, place, and time. Mental status is at baseline.  Psychiatric:        Mood and Affect: Mood normal.    ED Results / Procedures / Treatments   Labs (all labs ordered are listed, but only abnormal results are displayed) Labs Reviewed  COMPREHENSIVE METABOLIC PANEL - Abnormal; Notable for the following components:      Result Value   BUN 25 (*)    Calcium 8.8 (*)    Total Protein 6.0 (*)    Total Bilirubin 0.2 (*)    All other components within normal limits  CBC - Abnormal; Notable for the following  components:   RBC 3.81 (*)    Hemoglobin 11.0 (*)    HCT 34.9 (*)    All other components within normal limits  URINALYSIS, ROUTINE W REFLEX MICROSCOPIC - Abnormal; Notable for the following components:   Hgb urine dipstick TRACE (*)    All other components within normal limits  URINALYSIS, MICROSCOPIC (REFLEX) - Abnormal; Notable for the following components:   Bacteria, UA RARE (*)    All other components within normal limits  LIPASE, BLOOD    EKG None  Radiology CT Abdomen Pelvis W Contrast  Result Date: 09/19/2021 CLINICAL DATA:  Abdominal pain. EXAM: CT ABDOMEN AND PELVIS WITH CONTRAST TECHNIQUE: Multidetector CT imaging of the abdomen and pelvis was performed using the standard protocol following bolus administration of intravenous contrast. CONTRAST:  164mL OMNIPAQUE IOHEXOL 300 MG/ML  [facility] COMPARISON:  CT abdomen pelvis dated 10/19/2020. FINDINGS: Lower chest: Background of emphysema. Cardiac pacemaker wires noted. No intra-abdominal free air or free fluid. Hepatobiliary: Multiple liver cysts measure up to 5 cm in the right lobe. Several additional subcentimeter hypodense lesions are too small to characterize. No intrahepatic biliary dilatation. The gallbladder is unremarkable. Pancreas: Unremarkable. No pancreatic ductal dilatation or surrounding inflammatory changes. Spleen: Normal in size without focal abnormality. Adrenals/Urinary Tract: The adrenal glands unremarkable there is no hydronephrosis on either side. Small bilateral renal parapelvic cysts. There is symmetric enhancement and excretion of contrast by both kidneys. The visualized ureters and urinary bladder appear unremarkable. Stomach/Bowel: Moderate size hiatal hernia. There is moderate amount of stool throughout the colon. There is no bowel obstruction or active inflammation. The appendix is not visualized with certainty. No inflammatory changes identified in the right lower quadrant. Vascular/Lymphatic: Advanced  aortoiliac atherosclerotic disease. Apparent filling defect in the left common femoral vein (74/2) may be artifactual or represent a clot. Further evaluation with dedicated duplex ultrasound recommended. The IVC is unremarkable.  No portal venous gas. There is no adenopathy. Reproductive: The prostate and seminal vesicles are grossly unremarkable. No pelvic mass. Other: None Musculoskeletal: Osteopenia with degenerative changes of the spine. Age indeterminate, likely chronic L3 compression fracture. Correlation with clinical exam and point tenderness recommended. No definite acute osseous pathology. IMPRESSION: 1. No acute intra-abdominal or pelvic pathology. 2. Moderate size hiatal hernia. 3. Moderate colonic stool burden. No bowel obstruction. 4. Apparent filling defect in the left common femoral vein may be artifactual or represent a clot. Further evaluation with dedicated duplex ultrasound recommended. 5. Age indeterminate, likely chronic L3 compression fracture. Correlation with clinical exam and point tenderness recommended. 6. Aortic Atherosclerosis (ICD10-I70.0) and Emphysema (ICD10-J43.9). Electronically Signed   By: Anner Crete M.D.   On: 09/19/2021 22:45    Procedures Procedures   Medications Ordered in ED Medications  iohexol (OMNIPAQUE) 300 MG/ML solution 100 mL (100 mLs Intravenous Contrast Given 09/19/21 2225)    ED Course  I have reviewed the triage vital signs and the nursing notes.  Pertinent labs & imaging results that were available during my care of the patient were reviewed by me and considered in my medical decision making (see chart for details).    MDM Rules/Calculators/A&P                          87 year old male presents emergency department epigastric abdominal pain, intermittent for the past month with scattered nausea/vomiting.  Slight decrease in appetite but otherwise has been able to p.o. at baseline.  Normal stools.  Vital signs are normal here.  Patient  has slight epigastric abdominal tenderness to palpation but otherwise benign abdomen.  CT of the abdomen pelvis identifies a hiatal hernia which I believe is most likely the source of the symptoms.  Otherwise there are no other acute findings but it does mention a possible filling defect in the left femoral artery.  Patient has no lower abdominal pain, no symptoms of claudication, no leg pain, no leg discoloration.  He has a reassuring physical exam, equal palpable DP pulses and capillary refill, he is neuro intact.  Very low suspicion for acute vascular pathology given the reassuring physical exam findings.  Do not feel the need for any emergent vascular imaging at this time.  Plan for PPI, outpatient GI follow-up and education in regards to any lower abdominal/leg symptoms.  Patient at this time appears safe and stable for discharge and will be treated as an outpatient.  Discharge plan and strict return to ED precautions discussed, patient verbalizes understanding and agreement.     Final Clinical Impression(s) / ED Diagnoses Final diagnoses:  None    Rx / DC Orders ED Discharge Orders     None        Lorelle Gibbs, DO 09/19/21 2320

## 2021-09-19 NOTE — ED Triage Notes (Signed)
Pt c/o abd pain, intermittent n/v x 1 month-sx worse x 2 days-NAD-steady gait

## 2021-10-01 ENCOUNTER — Ambulatory Visit (INDEPENDENT_AMBULATORY_CARE_PROVIDER_SITE_OTHER): Payer: Medicare Other

## 2021-10-01 DIAGNOSIS — I442 Atrioventricular block, complete: Secondary | ICD-10-CM | POA: Diagnosis not present

## 2021-10-01 LAB — CUP PACEART REMOTE DEVICE CHECK
Battery Remaining Longevity: 156 mo
Battery Voltage: 3.12 V
Brady Statistic AP VP Percent: 0.06 %
Brady Statistic AP VS Percent: 36.7 %
Brady Statistic AS VP Percent: 0.05 %
Brady Statistic AS VS Percent: 63.19 %
Brady Statistic RA Percent Paced: 36.83 %
Brady Statistic RV Percent Paced: 0.11 %
Date Time Interrogation Session: 20230109015427
Implantable Lead Implant Date: 20220110
Implantable Lead Implant Date: 20220110
Implantable Lead Location: 753859
Implantable Lead Location: 753860
Implantable Lead Model: 5076
Implantable Lead Model: 5076
Implantable Pulse Generator Implant Date: 20220110
Lead Channel Impedance Value: 304 Ohm
Lead Channel Impedance Value: 380 Ohm
Lead Channel Impedance Value: 418 Ohm
Lead Channel Impedance Value: 475 Ohm
Lead Channel Pacing Threshold Amplitude: 0.625 V
Lead Channel Pacing Threshold Amplitude: 0.75 V
Lead Channel Pacing Threshold Pulse Width: 0.4 ms
Lead Channel Pacing Threshold Pulse Width: 0.4 ms
Lead Channel Sensing Intrinsic Amplitude: 14.25 mV
Lead Channel Sensing Intrinsic Amplitude: 14.25 mV
Lead Channel Sensing Intrinsic Amplitude: 2.25 mV
Lead Channel Sensing Intrinsic Amplitude: 2.25 mV
Lead Channel Setting Pacing Amplitude: 2 V
Lead Channel Setting Pacing Amplitude: 2.5 V
Lead Channel Setting Pacing Pulse Width: 0.4 ms
Lead Channel Setting Sensing Sensitivity: 1.2 mV

## 2021-10-09 ENCOUNTER — Other Ambulatory Visit: Payer: Self-pay

## 2021-10-09 DIAGNOSIS — E7800 Pure hypercholesterolemia, unspecified: Secondary | ICD-10-CM

## 2021-10-09 HISTORY — DX: Pure hypercholesterolemia, unspecified: E78.00

## 2021-10-09 NOTE — Progress Notes (Signed)
Remote pacemaker transmission.   

## 2021-11-21 ENCOUNTER — Ambulatory Visit: Payer: Medicare Other | Admitting: Cardiology

## 2021-11-26 ENCOUNTER — Other Ambulatory Visit: Payer: Self-pay | Admitting: Cardiology

## 2021-11-29 ENCOUNTER — Other Ambulatory Visit: Payer: Self-pay

## 2021-12-03 ENCOUNTER — Other Ambulatory Visit: Payer: Self-pay

## 2021-12-03 ENCOUNTER — Ambulatory Visit (INDEPENDENT_AMBULATORY_CARE_PROVIDER_SITE_OTHER): Payer: Medicare Other | Admitting: Cardiology

## 2021-12-03 ENCOUNTER — Encounter: Payer: Self-pay | Admitting: Cardiology

## 2021-12-03 VITALS — BP 144/70 | HR 84 | Ht 67.6 in | Wt 142.1 lb

## 2021-12-03 DIAGNOSIS — Z950 Presence of cardiac pacemaker: Secondary | ICD-10-CM | POA: Diagnosis not present

## 2021-12-03 DIAGNOSIS — I77810 Thoracic aortic ectasia: Secondary | ICD-10-CM | POA: Diagnosis not present

## 2021-12-03 DIAGNOSIS — E782 Mixed hyperlipidemia: Secondary | ICD-10-CM | POA: Diagnosis not present

## 2021-12-03 DIAGNOSIS — I447 Left bundle-branch block, unspecified: Secondary | ICD-10-CM | POA: Diagnosis not present

## 2021-12-03 NOTE — Progress Notes (Signed)
Cardiology Office Note:    Date:  12/03/2021   ID:  [name], DOB [birth year], MRN [id number]  PCP:  Berkley Harvey, NP  Cardiologist:  Jenean Lindau, MD   Referring MD: Berkley Harvey, NP    ASSESSMENT:    1. Mixed hyperlipidemia   2. LBBB (left bundle branch block)   3. Ascending aorta dilatation (HCC)   4. Presence of permanent cardiac pacemaker    PLAN:    In order of problems listed above:  Primary prevention stressed with the patient.  Importance of compliance with diet medication stressed any vocalized understanding.  He was advised to walk to the best of his ability. Essential hypertension: Blood pressure stable and diet was emphasized.  Lifestyle modification urged. Mixed dyslipidemia: Lipids reviewed diet emphasized. Ascending aortic ectasia: Stable at this time.  We will continue to monitor.  He is not keen on evaluation at this time.  I respect his wishes. Post permanent pacemaker: Stable and followed by electrophysiology colleagues. Patient will be seen in follow-up appointment in 9 months or earlier if the patient has any concerns    Medication Adjustments/Labs and Tests Ordered: Current medicines are reviewed at length with the patient today.  Concerns regarding medicines are outlined above.  No orders of the defined types were placed in this encounter.  No orders of the defined types were placed in this encounter.    No chief complaint on file.    History of Present Illness:    Oscar [name] is a 88 y.o. male.  Patient has past medical history of ascending aortic aneurysm, essential hypertension and dyslipidemia.  He denies any problems at this time and takes care of activities of daily living.  No chest pain orthopnea or PND.  At the time of my evaluation, the patient is alert awake oriented and in no distress.  He ambulates age appropriately. Past Medical History:  Diagnosis Date   Abnormal nuclear stress test    Abnormal results of thyroid  function studies 01/19/2014   Formatting of this note might be different from the original. August 2016  TSH 4.01   Acute CVA (cerebrovascular accident) ([place]) 03/12/2020   Adult body mass index 26.0-26.9 06/07/2013   Apneic episode 11/29/2020   Ascending aorta dilatation ([place]) 12/07/2020   Atrial tachycardia ([place]) 01/24/2020   Atrial tachycardia, paroxysmal ([place]) 08/05/2019   Bladder tumor    Bunion of great toe of right foot 09/18/2016   Cancer of bladder ([place]) 02/10/2013   Centrilobular emphysema ([place]) 11/26/2020   Cervical myelopathy ([place]) 01/27/2018   Cervical stenosis of spine    CHB (complete heart block) ([place]) 09/28/2020   Coronary artery disease involving native coronary artery of native heart without angina pectoris 08/05/2019   CVA (cerebral vascular accident) ([place]) 03/12/2020   E. coli infection    Encounter for general adult medical examination without abnormal findings 05/20/2016   Essential hypertension 03/12/2020   Exertional dyspnea 01/18/2020   Gastro-esophageal reflux disease with esophagitis 09/28/2010   GERD (gastroesophageal reflux disease)    History of appendectomy    History of bladder cancer 03/14/2014   History of concussion    X2   NO RESIDUALS   History of peptic ulcer 1970'S   Hospital discharge follow-up 03/28/2020   Hypercholesterolemia    Hyperlipidemia    Hyponatremia 11/13/2020   Hypothyroidism (acquired) 03/12/2020   Hypothyroidism due to Hashimoto's thyroiditis 07/23/2019   Irregular heart beat    LBBB (left bundle branch block)  Mixed hyperlipidemia 11/24/2013   Formatting of this note might be different from the original. History of TIA.  The goal is to have your total cholesterol < 200, the HDL (good cholesterol) >40, and the LDL (bad cholesterol) <100. It is recomended that you follow a good low fat diet and exercise for 30 minutes 3-4 times a week.   Near syncope 05/16/2014   Personal history of tobacco use, presenting hazards to health  01/12/2014   Presence of permanent cardiac pacemaker 11/15/2020   S/P bilateral cataract extraction 09/18/2016   Serum reaction due to vaccination 09/27/2011   Formatting of this note might be different from the original. IMPRESSION: seen on Wednesday for reaction to right arm after pneumovax, had redness, warmth , tenderness and edema of right upper arm which is now resolved. monitor area. use warm compresses prn. call prn. discussed will not need another pneumovax, he will consider zostavax in future.   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 IMPRESSION: 8 2015 admitted for TIA -negative CT and MRI.  Echocardiogram was also completed which was normal.  Carotid Dopplers negative for significant stenosis.  Sleep study was normal.    Past Surgical History:  Procedure Laterality Date   ANTERIOR CERVICAL DECOMP/DISCECTOMY FUSION N/A 01/27/2018   Procedure: Anterior Cervical Discectomy Fusion - Cervical Four- Cervical Five;  Surgeon: Earnie Larsson, MD;  Location: [facility];  Service: Neurosurgery;  Laterality: N/A;  Anterior Cervical Discectomy Fusion - Cervical Four- Cervical Five   APPENDECTOMY  1970   CARDIOVASCULAR STRESS TEST  03/22/09   CYSTOSCOPY WITH BIOPSY N/A 02/10/2013   Procedure: CYSTOSCOPY WITH COLD CUP BIOPSY/FULGERATION;  Surgeon: Bernestine Amass, MD;  Location: [facility];  Service: Urology;  Laterality: N/A;  ALSO FULGERATION    INGUINAL HERNIA REPAIR Right 07-14-2002   LEFT HEART CATH AND CORONARY ANGIOGRAPHY N/A 07/13/2019   Procedure: LEFT HEART CATH AND CORONARY ANGIOGRAPHY;  Surgeon: Troy Sine, MD;  Location: [facility];  Service: Cardiovascular;  Laterality: N/A;   LOOP RECORDER IMPLANT N/A 05/24/2014   Procedure: LOOP  RECORDER IMPLANT;  Surgeon: Sanda Klein, MD;  Location: [facility];  Service: Cardiovascular;  Laterality: N/A;   LOOP RECORDER REMOVAL N/A 10/02/2020   Procedure: LOOP RECORDER REMOVAL;  Surgeon: Sanda Klein, MD;  Location: [facility];  Service: Cardiovascular;  Laterality: N/A;   NECK SURGERY     PACEMAKER IMPLANT N/A 10/02/2020   Procedure: PACEMAKER IMPLANT;  Surgeon: Sanda Klein, MD;  Location: [facility];  Service: Cardiovascular;  Laterality: N/A;   TONSILLECTOMY     TRANSURETHRAL RESECTION OF BLADDER TUMOR  03-10-2008   TRANSURETHRAL RESECTION OF BLADDER TUMOR Bilateral 12/28/2020   Procedure: TRANSURETHRAL RESECTION OF BLADDER TUMOR BLADDER BIOPSY FULGARATION BILATERAL RETROGRADE PYELOGRAM(TURBT);  Surgeon: Ardis Hughs, MD;  Location: [facility];  Service: Urology;  Laterality: Bilateral;   TUMOR EXCISION Left    Left arm    Current Medications: Current Meds  Medication Sig   alum & mag hydroxide-simeth (MAALOX MAX) 720-947-09 MG/5ML suspension Take 5 mLs by mouth every 6 (six) hours as needed for indigestion.   famotidine (PEPCID) 20 MG tablet Take 20 mg by mouth daily.   ferrous sulfate 325 (65 FE) MG EC tablet Take 1 tablet by mouth 3 (  three) times a week.   furosemide (LASIX) 20 MG tablet TAKE 1 TABLET BY MOUTH THREE TIMES A WEEK, (TUESDAYS, THURSDAY, AND SATURDAY)   Glucosamine-Chondroit-Vit C-Mn (GLUCOSAMINE 1500 COMPLEX PO) Take 1,500 mg by mouth daily.   Magnesium 400 MG CAPS Take 400 mg by mouth daily.   pantoprazole (PROTONIX) 40 MG tablet Take 40 mg by mouth daily.   polyethylene glycol (MIRALAX) 17 g packet Take 17 g by mouth daily.   sertraline (ZOLOFT) 50 MG tablet Take 50 mg by mouth daily.   Tiotropium Bromide Monohydrate (SPIRIVA RESPIMAT) 2.5 MCG/ACT AERS Inhale 2 puffs into the lungs daily.     Allergies:   Patient has no known allergies.   Social History   Socioeconomic History   Marital status: Married    Spouse name: Not on  file   Number of children: Not on file   Years of education: Not on file   Highest education level: Not on file  Occupational History   Occupation: retired  Tobacco Use   Smoking status: Former    Packs/day: 0.50    Years: 30.00    Pack years: 15.00    Types: Cigarettes    Quit date: 02/06/1979    Years since quitting: 42.8   Smokeless tobacco: Never  Vaping Use   Vaping Use: Never used  Substance and Sexual Activity   Alcohol use: Yes    Comment: occ   Drug use: No   Sexual activity: Not on file  Other Topics Concern   Not on file  Social History Narrative   Not on file   Social Determinants of Health   Financial Resource Strain: Not on file  Food Insecurity: Not on file  Transportation Needs: Not on file  Physical Activity: Not on file  Stress: Not on file  Social Connections: Not on file     Family History: The patient's family history includes Diabetes in his brother; Heart failure in his brother and mother; Stroke in his father.  ROS:   Please see the history of present illness.    All other systems reviewed and are negative.  EKGs/Labs/Other Studies Reviewed:    The following studies were reviewed today: I discussed my findings with the patient at length.   Recent Labs: 05/14/2021: TSH 1.950 09/19/2021: ALT 14; BUN 25; Creatinine, Ser 0.88; Hemoglobin 11.0; Platelets 275; Potassium 4.7; Sodium 136  Recent Lipid Panel    Component Value Date/Time   CHOL 136 05/14/2021 1135   TRIG 63 05/14/2021 1135   HDL 59 05/14/2021 1135   CHOLHDL 2.3 05/14/2021 1135   CHOLHDL 3.9 03/13/2020 0500   VLDL 19 03/13/2020 0500   LDLCALC 64 05/14/2021 1135    Physical Exam:    VS:  BP (!) 144/70    Pulse 84    Ht 5' 7.6" (1.717 m)    Wt 142 lb 1.9 oz (64.5 kg)    SpO2 98%    BMI 21.87 kg/m     Wt Readings from Last 3 Encounters:  12/03/21 142 lb 1.9 oz (64.5 kg)  09/19/21 143 lb (64.9 kg)  05/14/21 148 lb 0.6 oz (67.2 kg)     GEN: Patient is in no acute  distress HEENT: Normal NECK: No JVD; No carotid bruits LYMPHATICS: No lymphadenopathy CARDIAC: Hear sounds regular, 2/6 systolic murmur at the apex. RESPIRATORY:  Clear to auscultation without rales, wheezing or rhonchi  ABDOMEN: Soft, non-tender, non-distended MUSCULOSKELETAL:  No edema; No deformity  SKIN: Warm and dry NEUROLOGIC:  Alert and oriented x 3 PSYCHIATRIC:  Normal affect   Signed, Jenean Lindau, MD  12/03/2021 12:06 PM    [place]

## 2021-12-03 NOTE — Patient Instructions (Signed)

## 2021-12-31 ENCOUNTER — Ambulatory Visit (INDEPENDENT_AMBULATORY_CARE_PROVIDER_SITE_OTHER): Payer: Medicare Other

## 2021-12-31 DIAGNOSIS — I447 Left bundle-branch block, unspecified: Secondary | ICD-10-CM | POA: Diagnosis not present

## 2021-12-31 DIAGNOSIS — Z950 Presence of cardiac pacemaker: Secondary | ICD-10-CM

## 2022-01-01 LAB — CUP PACEART REMOTE DEVICE CHECK
Battery Remaining Longevity: 151 mo
Battery Voltage: 3.07 V
Brady Statistic AP VP Percent: 0.08 %
Brady Statistic AP VS Percent: 45.23 %
Brady Statistic AS VP Percent: 0.06 %
Brady Statistic AS VS Percent: 54.63 %
Brady Statistic RA Percent Paced: 45.36 %
Brady Statistic RV Percent Paced: 0.14 %
Date Time Interrogation Session: 20230409210944
Implantable Lead Implant Date: 20220110
Implantable Lead Implant Date: 20220110
Implantable Lead Location: 753859
Implantable Lead Location: 753860
Implantable Lead Model: 5076
Implantable Lead Model: 5076
Implantable Pulse Generator Implant Date: 20220110
Lead Channel Impedance Value: 285 Ohm
Lead Channel Impedance Value: 342 Ohm
Lead Channel Impedance Value: 418 Ohm
Lead Channel Impedance Value: 475 Ohm
Lead Channel Pacing Threshold Amplitude: 0.625 V
Lead Channel Pacing Threshold Amplitude: 0.625 V
Lead Channel Pacing Threshold Pulse Width: 0.4 ms
Lead Channel Pacing Threshold Pulse Width: 0.4 ms
Lead Channel Sensing Intrinsic Amplitude: 12.625 mV
Lead Channel Sensing Intrinsic Amplitude: 12.625 mV
Lead Channel Sensing Intrinsic Amplitude: 2 mV
Lead Channel Sensing Intrinsic Amplitude: 2 mV
Lead Channel Setting Pacing Amplitude: 2 V
Lead Channel Setting Pacing Amplitude: 2.5 V
Lead Channel Setting Pacing Pulse Width: 0.4 ms
Lead Channel Setting Sensing Sensitivity: 1.2 mV

## 2022-01-08 ENCOUNTER — Encounter: Payer: Self-pay | Admitting: Physical Therapy

## 2022-01-08 NOTE — Therapy (Signed)
[facility] ?[place], [place], [address] ?Phone: [phone]   Fax:  [phone] ? ?Patient Details  ?Name: Oscar [name] ?MRN: [id number] ?Date of Birth: 10-12-1930 ?Referring Provider:  No ref. provider found ? ?Encounter Date: 01/08/2022 ? ?PHYSICAL THERAPY DISCHARGE SUMMARY ? ?Visits from Start of Care: 4 ? ?Current functional level related to goals / functional outcomes: ?Did not return since last visit  ?  ?Remaining deficits: ?Unable to assess  ?  ?Education / Equipment: ?N/A   ? ?Patient agrees to discharge. Patient goals were partially met. Patient is being discharged due to not returning since the last visit. ? ?Tammera Engert U PT, DPT, PN2  ? ?Supplemental Physical Therapist ?[facility]  ? ? ? ? ? ?[facility] ?[place], [place], [address] ?Phone: [phone]   Fax:  [phone] ?

## 2022-01-10 ENCOUNTER — Telehealth: Payer: Self-pay | Admitting: Cardiology

## 2022-01-10 NOTE — Telephone Encounter (Signed)
Message give to Dr. Geraldo Pitter as this is not cardiology related. ?

## 2022-01-10 NOTE — Telephone Encounter (Signed)
[place], Patient's Real [employer] called. The patient specifically asked Beth to call Dr. Geraldo Pitter on his behalf.  ? ?The patient has a personal matter that has been discussed at previous visits but would like to be kept personal.  ? ?The patient does not have any urgent issues. He would just like for Dr. Geraldo Pitter or his RN to contact Beth to address his concerns  ?

## 2022-01-15 NOTE — Progress Notes (Signed)
Remote pacemaker transmission.   

## 2022-01-30 ENCOUNTER — Other Ambulatory Visit (HOSPITAL_COMMUNITY): Payer: Self-pay | Admitting: Nurse Practitioner

## 2022-01-30 ENCOUNTER — Other Ambulatory Visit: Payer: Self-pay | Admitting: Nurse Practitioner

## 2022-01-30 DIAGNOSIS — R1111 Vomiting without nausea: Secondary | ICD-10-CM

## 2022-02-17 ENCOUNTER — Other Ambulatory Visit: Payer: Self-pay | Admitting: Neurology

## 2022-02-27 ENCOUNTER — Ambulatory Visit: Payer: Medicare Other | Admitting: Gastroenterology

## 2022-03-05 ENCOUNTER — Ambulatory Visit: Payer: Medicare Other | Admitting: Gastroenterology

## 2022-05-10 DIAGNOSIS — K920 Hematemesis: Secondary | ICD-10-CM | POA: Insufficient documentation

## 2022-07-03 ENCOUNTER — Telehealth: Payer: Self-pay

## 2022-07-03 DIAGNOSIS — I209 Angina pectoris, unspecified: Secondary | ICD-10-CM | POA: Insufficient documentation

## 2022-07-03 NOTE — Telephone Encounter (Signed)
Pt transferred to [facility].

## 2022-09-24 DIAGNOSIS — R109 Unspecified abdominal pain: Secondary | ICD-10-CM | POA: Insufficient documentation

## 2022-09-25 DIAGNOSIS — E43 Unspecified severe protein-calorie malnutrition: Secondary | ICD-10-CM | POA: Insufficient documentation

## 2022-10-22 DIAGNOSIS — R6881 Early satiety: Secondary | ICD-10-CM | POA: Insufficient documentation

## 2022-11-16 DIAGNOSIS — I495 Sick sinus syndrome: Secondary | ICD-10-CM | POA: Insufficient documentation

## 2022-11-16 DIAGNOSIS — R110 Nausea: Secondary | ICD-10-CM | POA: Insufficient documentation

## 2022-11-16 DIAGNOSIS — R1114 Bilious vomiting: Secondary | ICD-10-CM | POA: Insufficient documentation

## 2022-11-27 DIAGNOSIS — R079 Chest pain, unspecified: Secondary | ICD-10-CM | POA: Insufficient documentation

## 2022-12-24 DIAGNOSIS — K4090 Unilateral inguinal hernia, without obstruction or gangrene, not specified as recurrent: Secondary | ICD-10-CM | POA: Insufficient documentation

## 2023-02-04 DIAGNOSIS — Z8719 Personal history of other diseases of the digestive system: Secondary | ICD-10-CM | POA: Insufficient documentation

## 2023-07-21 ENCOUNTER — Telehealth: Payer: Self-pay | Admitting: Cardiology

## 2023-07-21 NOTE — Telephone Encounter (Signed)
Patient's daughter is calling with questions in regards to the patient. Please advise.

## 2023-07-21 NOTE — Telephone Encounter (Signed)
Spoke with Vernona Rieger (pt's dgt) requesting an appt for her mother as the pt and wife are moving back to area. Appt made

## 2023-07-21 NOTE — Telephone Encounter (Signed)
error 

## 2023-08-27 ENCOUNTER — Telehealth: Payer: Self-pay | Admitting: Family Medicine

## 2023-08-27 NOTE — Telephone Encounter (Signed)
Pt came in office stating is needing PCP to est with and was recommended to see Dr Carmelia Roller if ok to est as new pt, Recommended by Dr Tomie China. Please contact pt Social Worker Sue Lush at [facility] [phone]) to inform pt if ok to est pt as new pt. Please advise.

## 2023-08-28 NOTE — Telephone Encounter (Signed)
That's fine. Ty.  

## 2023-09-02 NOTE — Telephone Encounter (Signed)
LVM with Social Worker Sue Lush to return call - ok to schedule npt appt with Dr Carmelia Roller.

## 2023-11-19 ENCOUNTER — Encounter: Payer: Self-pay | Admitting: Family Medicine

## 2023-11-19 ENCOUNTER — Ambulatory Visit: Payer: Medicare Other | Admitting: Family Medicine

## 2023-11-19 VITALS — BP 130/68 | HR 77 | Temp 98.0°F | Resp 16 | Ht 67.0 in | Wt 145.0 lb

## 2023-11-19 DIAGNOSIS — K219 Gastro-esophageal reflux disease without esophagitis: Secondary | ICD-10-CM | POA: Diagnosis not present

## 2023-11-19 DIAGNOSIS — N401 Enlarged prostate with lower urinary tract symptoms: Secondary | ICD-10-CM

## 2023-11-19 DIAGNOSIS — N3281 Overactive bladder: Secondary | ICD-10-CM

## 2023-11-19 DIAGNOSIS — R351 Nocturia: Secondary | ICD-10-CM

## 2023-11-19 HISTORY — DX: Benign prostatic hyperplasia with lower urinary tract symptoms: N40.1

## 2023-11-19 HISTORY — DX: Overactive bladder: N32.81

## 2023-11-19 MED ORDER — GEMTESA 75 MG PO TABS: ORAL_TABLET | ORAL | 2 refills | Status: DC

## 2023-11-19 MED ORDER — FUROSEMIDE 20 MG PO TABS: 20.0000 mg | ORAL_TABLET | Freq: Every day | ORAL | 2 refills | Status: DC

## 2023-11-19 MED ORDER — TAMSULOSIN HCL 0.4 MG PO CAPS: 0.4000 mg | ORAL_CAPSULE | Freq: Every day | ORAL | 2 refills | Status: DC

## 2023-11-19 MED ORDER — PANTOPRAZOLE SODIUM 40 MG PO TBEC: 40.0000 mg | DELAYED_RELEASE_TABLET | Freq: Every day | ORAL | 2 refills | Status: AC

## 2023-11-19 NOTE — Patient Instructions (Signed)
 Let me know if there are cost issues.   Try to avoid coffee after noon.   Let us know if you need anything.

## 2023-11-19 NOTE — Progress Notes (Signed)
 Chief Complaint  Patient presents with   Establish Care    Establishing Care       New Patient Visit SUBJECTIVE: HPI: Oscar [name] is an 89 y.o.male who is being seen for establishing care.  The patient was previously seen at a [facility]?  He is here with his wife and a caregiver.  Patient has a history of overactive bladder.  He does urinate frequently throughout the day but mainly at night he will wake up every hour.  He has no pain, bleeding, discharge, or issues with constipation.  There is no abdominal pain.  He is compliant with Gemtesa 75 mg daily.  He reports compliance and no adverse effects.  He is also taking Flomax 0.4 mg daily.  No adverse effects with this either.  Patient has a history of reflux.  He takes Protonix 40 mg daily.  He reports compliance and no adverse effects.  He denies any burning in his chest, abdominal pain, sore throat, coughing, waterbrash, nausea, vomiting, difficulty swallowing, blood in stool, or dark tarry stools.  Past Medical History:  Diagnosis Date   Abnormal nuclear stress test    Abnormal results of thyroid function studies 01/19/2014   Formatting of this note might be different from the original. August 2016  TSH 4.01   Apneic episode 11/29/2020   Ascending aorta dilatation (HCC) 12/07/2020   Atrial tachycardia, paroxysmal (HCC) 08/05/2019   Bunion of great toe of right foot 09/18/2016   Centrilobular emphysema (HCC) 11/26/2020   Cervical stenosis of spine    CHB (complete heart block) (HCC) 09/28/2020   Coronary artery disease involving native coronary artery of native heart without angina pectoris 08/05/2019   CVA (cerebral vascular accident) ([facility]) 03/12/2020   Encounter for general adult medical examination without abnormal findings 05/20/2016   Essential hypertension 03/12/2020   Exertional dyspnea 01/18/2020   Gastro-esophageal reflux disease with esophagitis 09/28/2010   GERD (gastroesophageal reflux disease)     History of bladder cancer 03/14/2014   History of concussion    X2   NO RESIDUALS   History of peptic ulcer 1970'S   Hypercholesterolemia    Hypothyroidism due to Hashimoto's thyroiditis 07/23/2019   LBBB (left bundle branch block)    Macular degeneration of left eye    Mixed hyperlipidemia 11/24/2013   Formatting of this note might be different from the original. History of TIA.  The goal is to have your total cholesterol < 200, the HDL (good cholesterol) >40, and the LDL (bad cholesterol) <100. It is recomended that you follow a good low fat diet and exercise for 30 minutes 3-4 times a week.   Personal history of tobacco use, presenting hazards to health 01/12/2014   Presence of permanent cardiac pacemaker 11/15/2020   S/P bilateral cataract extraction 09/18/2016   Serum reaction due to vaccination 09/27/2011   Formatting of this note might be different from the original. IMPRESSION: seen on Wednesday for reaction to right arm after pneumovax, had redness, warmth , tenderness and edema of right upper arm which is now resolved. monitor area. use warm compresses prn. call prn. discussed will not need another pneumovax, he will consider zostavax in future.   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   Status post placement of implantable loop recorder 11/08/2014   TIA (transient ischemic attack) 04/09/2014   Past Surgical History:  Procedure Laterality Date   ANTERIOR CERVICAL DECOMP/DISCECTOMY FUSION N/A 01/27/2018   Procedure: Anterior Cervical Discectomy Fusion - Cervical Four- Cervical Five;  Surgeon: Julio Sicks, MD;  Location: [facility];  Service: Neurosurgery;  Laterality: N/A;  Anterior Cervical Discectomy Fusion - Cervical Four- Cervical Five   APPENDECTOMY  1970   CARDIOVASCULAR STRESS TEST  03/22/09   CYSTOSCOPY WITH BIOPSY N/A 02/10/2013   Procedure: CYSTOSCOPY WITH COLD CUP BIOPSY/FULGERATION;  Surgeon: Valetta Fuller, MD;  Location: [facility];   Service: Urology;  Laterality: N/A;  ALSO FULGERATION    INGUINAL HERNIA REPAIR Right 07-14-2002   LEFT HEART CATH AND CORONARY ANGIOGRAPHY N/A 07/13/2019   Procedure: LEFT HEART CATH AND CORONARY ANGIOGRAPHY;  Surgeon: Lennette Bihari, MD;  [facility];  Service: Cardiovascular;  Laterality: N/A;   LOOP RECORDER IMPLANT N/A 05/24/2014   Procedure: LOOP RECORDER IMPLANT;  Surgeon: Thurmon Fair, MD;  Location: [facility];  Service: Cardiovascular;  Laterality: N/A;   LOOP RECORDER REMOVAL N/A 10/02/2020   Procedure: LOOP RECORDER REMOVAL;  Surgeon: Thurmon Fair, MD;  Location: [facility];  Service: Cardiovascular;  Laterality: N/A;   NECK SURGERY     PACEMAKER IMPLANT N/A 10/02/2020   Procedure: PACEMAKER IMPLANT;  Surgeon: Thurmon Fair, MD;  Location: [facility];  Service: Cardiovascular;  Laterality: N/A;   TONSILLECTOMY     TRANSURETHRAL RESECTION OF BLADDER TUMOR  03-10-2008   TRANSURETHRAL RESECTION OF BLADDER TUMOR Bilateral 12/28/2020   Procedure: TRANSURETHRAL RESECTION OF BLADDER TUMOR BLADDER BIOPSY FULGARATION BILATERAL RETROGRADE PYELOGRAM(TURBT);  Surgeon: Crist Fat, MD;  Location: [facility];  Service: Urology;  Laterality: Bilateral;   TUMOR EXCISION Left    Left arm   Family History  Problem Relation Age of Onset   Heart failure Mother    Stroke Father    Diabetes Brother    Heart failure Brother    No Known Allergies  Current Outpatient Medications:    atorvastatin (LIPITOR) 40 MG tablet, Take 1 tablet (40 mg total) by mouth daily., Disp: 90 tablet, Rfl: 3   famotidine (PEPCID) 20 MG tablet, Take 20 mg by mouth daily., Disp: , Rfl:    ferrous sulfate 325 (65 FE) MG EC tablet, Take 1 tablet by mouth 3 (three) times a week., Disp: , Rfl:    furosemide (LASIX) 20 MG tablet, Take 1 tablet (20 mg total) by mouth daily., Disp: 90 tablet, Rfl: 2   sertraline (ZOLOFT) 50 MG tablet, Take 50 mg by mouth daily., Disp: , Rfl:     tamsulosin (FLOMAX) 0.4 MG CAPS capsule, Take 1 capsule (0.4 mg total) by mouth daily., Disp: 90 capsule, Rfl: 2   Vibegron (GEMTESA) 75 MG TABS, Take 1 tab by mouth daily., Disp: 90 tablet, Rfl: 2   pantoprazole (PROTONIX) 40 MG tablet, Take 1 tablet (40 mg total) by mouth daily., Disp: 90 tablet, Rfl: 2  OBJECTIVE: BP 130/68 (BP Location: Left Arm, Patient Position: Sitting)   Pulse 77   Temp 98 F (36.7 C) (Oral)   Resp 16   Ht 5\' 7"  (1.702 m)   Wt 145 lb (65.8 kg)   SpO2 98%   BMI 22.71 kg/m  General:  well developed, well nourished, in no apparent distress Throat/Pharynx:  lips and gingiva without lesion; tongue and uvula midline; non-inflamed pharynx; no exudates or postnasal drainage Lungs:  clear to auscultation, breath sounds equal bilaterally, no respiratory distress Cardio:  regular rate and rhythm, no LE edema or bruits Abdomen: Bowel sounds present, soft, nontender, nondistended Psych: Normal affect  ASSESSMENT/PLAN: OAB (overactive bladder) - Plan: Vibegron (GEMTESA) 75  MG TABS  Gastroesophageal reflux disease, unspecified whether esophagitis present - Plan: pantoprazole (PROTONIX) 40 MG tablet  Benign prostatic hyperplasia with nocturia - Plan: tamsulosin (FLOMAX) 0.4 MG CAPS capsule  Patient instructed to sign release of records form from their previous PCP. Chronic, stable. Cont Gemtesa 75 mg/d.  Chronic, stable.  Continue Protonix 40 mg daily. Chronic, acceptable for now.  Continue Flomax 0.4 mg daily. Patient should return in 6 mo. The patient voiced understanding and agreement to the plan.   Jilda Roche [place], DO 11/19/23  1:59 PM

## 2023-12-02 ENCOUNTER — Telehealth: Payer: Self-pay | Admitting: Family Medicine

## 2023-12-02 NOTE — Telephone Encounter (Signed)
 Copied from CRM [id number]. Topic: Referral - Request for Referral >> Dec 01, 2023  4:14 PM Elizebeth Brooking wrote: Did the patient discuss referral with their provider in the last year? Yes (If No - schedule appointment) (If Yes - send message)  Appointment offered? Yes  When they call to get scheduled would like for them to give his care manager a call to schedule appointment  Evorn Gong [phone]  Type of order/referral and detailed reason for visit: OPTOMETRIST  Preference of office, provider, location:   If referral order, have you been seen by this specialty before? No (If Yes, this issue or another issue? When? Where?  Can we respond through MyChart? Yes

## 2023-12-09 ENCOUNTER — Encounter: Payer: Self-pay | Admitting: Family Medicine

## 2023-12-09 DIAGNOSIS — M6289 Other specified disorders of muscle: Secondary | ICD-10-CM

## 2023-12-11 ENCOUNTER — Emergency Department (HOSPITAL_COMMUNITY)

## 2023-12-11 ENCOUNTER — Other Ambulatory Visit: Payer: Self-pay

## 2023-12-11 ENCOUNTER — Inpatient Hospital Stay (HOSPITAL_COMMUNITY)
Admission: EM | Admit: 2023-12-11 | Discharge: 2023-12-17 | DRG: 481 | Disposition: A | Discharge status: Skilled Nursing Facility | Source: Skilled Nursing Facility | Attending: Family Medicine | Admitting: Family Medicine

## 2023-12-11 DIAGNOSIS — K219 Gastro-esophageal reflux disease without esophagitis: Secondary | ICD-10-CM | POA: Diagnosis present

## 2023-12-11 DIAGNOSIS — S72141A Displaced intertrochanteric fracture of right femur, initial encounter for closed fracture: Principal | ICD-10-CM | POA: Diagnosis present

## 2023-12-11 DIAGNOSIS — I447 Left bundle-branch block, unspecified: Secondary | ICD-10-CM | POA: Diagnosis present

## 2023-12-11 DIAGNOSIS — Z8673 Personal history of transient ischemic attack (TIA), and cerebral infarction without residual deficits: Secondary | ICD-10-CM

## 2023-12-11 DIAGNOSIS — I2510 Atherosclerotic heart disease of native coronary artery without angina pectoris: Secondary | ICD-10-CM | POA: Diagnosis present

## 2023-12-11 DIAGNOSIS — R739 Hyperglycemia, unspecified: Secondary | ICD-10-CM

## 2023-12-11 DIAGNOSIS — Z8249 Family history of ischemic heart disease and other diseases of the circulatory system: Secondary | ICD-10-CM

## 2023-12-11 DIAGNOSIS — I951 Orthostatic hypotension: Secondary | ICD-10-CM | POA: Diagnosis present

## 2023-12-11 DIAGNOSIS — F39 Unspecified mood [affective] disorder: Secondary | ICD-10-CM | POA: Diagnosis present

## 2023-12-11 DIAGNOSIS — Z79899 Other long term (current) drug therapy: Secondary | ICD-10-CM

## 2023-12-11 DIAGNOSIS — I110 Hypertensive heart disease with heart failure: Secondary | ICD-10-CM | POA: Diagnosis present

## 2023-12-11 DIAGNOSIS — Y93K1 Activity, walking an animal: Secondary | ICD-10-CM

## 2023-12-11 DIAGNOSIS — S72001A Fracture of unspecified part of neck of right femur, initial encounter for closed fracture: Secondary | ICD-10-CM | POA: Diagnosis present

## 2023-12-11 DIAGNOSIS — M25551 Pain in right hip: Secondary | ICD-10-CM | POA: Diagnosis not present

## 2023-12-11 DIAGNOSIS — Z66 Do not resuscitate: Secondary | ICD-10-CM | POA: Diagnosis present

## 2023-12-11 DIAGNOSIS — E782 Mixed hyperlipidemia: Secondary | ICD-10-CM | POA: Diagnosis present

## 2023-12-11 DIAGNOSIS — S72002A Fracture of unspecified part of neck of left femur, initial encounter for closed fracture: Secondary | ICD-10-CM | POA: Diagnosis present

## 2023-12-11 DIAGNOSIS — Z8551 Personal history of malignant neoplasm of bladder: Secondary | ICD-10-CM

## 2023-12-11 DIAGNOSIS — Z950 Presence of cardiac pacemaker: Secondary | ICD-10-CM

## 2023-12-11 DIAGNOSIS — Z823 Family history of stroke: Secondary | ICD-10-CM

## 2023-12-11 DIAGNOSIS — K2100 Gastro-esophageal reflux disease with esophagitis, without bleeding: Secondary | ICD-10-CM | POA: Diagnosis present

## 2023-12-11 DIAGNOSIS — I5032 Chronic diastolic (congestive) heart failure: Secondary | ICD-10-CM | POA: Diagnosis present

## 2023-12-11 DIAGNOSIS — J432 Centrilobular emphysema: Secondary | ICD-10-CM | POA: Diagnosis present

## 2023-12-11 DIAGNOSIS — Z87891 Personal history of nicotine dependence: Secondary | ICD-10-CM

## 2023-12-11 DIAGNOSIS — D649 Anemia, unspecified: Secondary | ICD-10-CM | POA: Diagnosis present

## 2023-12-11 DIAGNOSIS — Y92098 Other place in other non-institutional residence as the place of occurrence of the external cause: Secondary | ICD-10-CM

## 2023-12-11 DIAGNOSIS — N400 Enlarged prostate without lower urinary tract symptoms: Secondary | ICD-10-CM | POA: Diagnosis present

## 2023-12-11 DIAGNOSIS — D62 Acute posthemorrhagic anemia: Secondary | ICD-10-CM | POA: Diagnosis not present

## 2023-12-11 DIAGNOSIS — W010XXA Fall on same level from slipping, tripping and stumbling without subsequent striking against object, initial encounter: Secondary | ICD-10-CM | POA: Diagnosis present

## 2023-12-11 DIAGNOSIS — E063 Autoimmune thyroiditis: Secondary | ICD-10-CM | POA: Diagnosis present

## 2023-12-11 DIAGNOSIS — Z833 Family history of diabetes mellitus: Secondary | ICD-10-CM

## 2023-12-11 LAB — CBC WITH DIFFERENTIAL/PLATELET
Abs Immature Granulocytes: 0.06 10*3/uL (ref 0.00–0.07)
Basophils Absolute: 0 10*3/uL (ref 0.0–0.1)
Basophils Relative: 0 %
Eosinophils Absolute: 0.1 10*3/uL (ref 0.0–0.5)
Eosinophils Relative: 1 %
HCT: 28.4 % — ABNORMAL LOW (ref 39.0–52.0)
Hemoglobin: 8.6 g/dL — ABNORMAL LOW (ref 13.0–17.0)
Immature Granulocytes: 1 %
Lymphocytes Relative: 7 %
Lymphs Abs: 0.6 10*3/uL — ABNORMAL LOW (ref 0.7–4.0)
MCH: 27.6 pg (ref 26.0–34.0)
MCHC: 30.3 g/dL (ref 30.0–36.0)
MCV: 91 fL (ref 80.0–100.0)
Monocytes Absolute: 0.6 10*3/uL (ref 0.1–1.0)
Monocytes Relative: 7 %
Neutro Abs: 6.8 10*3/uL (ref 1.7–7.7)
Neutrophils Relative %: 84 %
Platelets: 156 10*3/uL (ref 150–400)
RBC: 3.12 MIL/uL — ABNORMAL LOW (ref 4.22–5.81)
RDW: 16 % — ABNORMAL HIGH (ref 11.5–15.5)
WBC: 8.1 10*3/uL (ref 4.0–10.5)
nRBC: 0 % (ref 0.0–0.2)

## 2023-12-11 LAB — BASIC METABOLIC PANEL
Anion gap: 10 (ref 5–15)
BUN: 25 mg/dL — ABNORMAL HIGH (ref 8–23)
CO2: 20 mmol/L — ABNORMAL LOW (ref 22–32)
Calcium: 8.4 mg/dL — ABNORMAL LOW (ref 8.9–10.3)
Chloride: 107 mmol/L (ref 98–111)
Creatinine, Ser: 0.94 mg/dL (ref 0.61–1.24)
GFR, Estimated: >60 mL/min (ref >60)
Glucose, Bld: 118 mg/dL — ABNORMAL HIGH (ref 70–99)
Potassium: 3.7 mmol/L (ref 3.5–5.1)
Sodium: 137 mmol/L (ref 135–145)

## 2023-12-11 MED ORDER — MORPHINE SULFATE (PF) 4 MG/ML IV SOLN
4.0000 mg | INTRAVENOUS | Status: DC | PRN
  Administered 2023-12-12: 4 mg via INTRAVENOUS
  Filled 2023-12-11: qty 1

## 2023-12-11 NOTE — ED Provider Notes (Signed)
 [place] EMERGENCY DEPARTMENT AT [facility] Provider Note   CSN: [id number] Arrival date & time: 12/11/23  2235     History {Add pertinent medical, surgical, social history, OB history to HPI:1} Chief Complaint  Patient presents with   Oscar [name] is a 89 y.o. male.  The history is provided by the patient.  Fall  He has history of hypertension, hyperlipidemia, coronary artery disease, stroke, GERD and comes in following a slip and fall.  He is complaining of pain in his right hip, has been unable to stand since then.  He denies head, neck, back injury.   Home Medications Prior to Admission medications   Medication Sig Start Date End Date Taking? Authorizing Provider  atorvastatin (LIPITOR) 40 MG tablet Take 1 tablet (40 mg total) by mouth daily. 11/27/20   Revankar, Aundra Dubin, MD  famotidine (PEPCID) 20 MG tablet Take 20 mg by mouth daily.    [provider]  ferrous sulfate 325 (65 FE) MG EC tablet Take 1 tablet by mouth 3 (three) times a week. 11/28/21   [provider]  furosemide (LASIX) 20 MG tablet Take 1 tablet (20 mg total) by mouth daily. 11/19/23   Sharlene Dory, DO  pantoprazole (PROTONIX) 40 MG tablet Take 1 tablet (40 mg total) by mouth daily. 11/19/23   Sharlene Dory, DO  sertraline (ZOLOFT) 50 MG tablet Take 50 mg by mouth daily. 09/13/20   [provider]  tamsulosin (FLOMAX) 0.4 MG CAPS capsule Take 1 capsule (0.4 mg total) by mouth daily. 11/19/23   Wendling, Jilda Roche, DO  Vibegron (GEMTESA) 75 MG TABS Take 1 tab by mouth daily. 11/19/23   Sharlene Dory, DO      Allergies    Patient has no known allergies.    Review of Systems   Review of Systems  All other systems reviewed and are negative.   Physical Exam Updated Vital Signs BP 130/72   Pulse 70   Temp 98.2 F (36.8 C)   Resp 20   SpO2 98%  Physical Exam Vitals and nursing note reviewed.   89 year old male, resting  comfortably and in no acute distress. Vital signs are normal. Oxygen saturation is 98%, which is normal. Head is normocephalic and atraumatic. PERRLA, EOMI. Oropharynx is clear. Neck is nontender. Back is nontender. Lungs are clear without rales, wheezes, or rhonchi. Chest is nontender. Heart has regular rate and rhythm without murmur. Abdomen is soft, flat, nontender. Extremities: Right leg is slightly shortened and slightly externally rotated.  There is tenderness palpation through the right hip and the right hemipelvis.  There is pain on passive range of motion of the right hip.  Full range of motion present all other joints without pain. Skin is warm and dry without rash. Neurologic: Mental status is normal, cranial nerves are intact, moves both arms and left leg equally, does not move right leg because of pain.  ED Results / Procedures / Treatments   Labs (all labs ordered are listed, but only abnormal results are displayed) Labs Reviewed  BASIC METABOLIC PANEL  CBC WITH DIFFERENTIAL/PLATELET  PROTIME-INR  TYPE AND SCREEN    EKG None  Radiology No results found.  Procedures Procedures  {Document cardiac monitor, telemetry assessment procedure when appropriate:1}  Medications Ordered in ED Medications  morphine (PF) 4 MG/ML injection 4 mg (has no administration in time range)    ED Course/ Medical Decision Making/ A&P   {  Click here for ABCD2, HEART and other calculatorsREFRESH Note before signing :1}                              Medical Decision Making Amount and/or Complexity of Data Reviewed Labs: ordered. Radiology: ordered.  Risk Prescription drug management.   Fall with injury to right hip.  Exam is concerning for fracture.  I have ordered x-rays and routine screening labs.  I have ordered morphine for pain.  {Document critical care time when appropriate:1} {Document review of labs and clinical decision tools ie heart score, Chads2Vasc2 etc:1}   {Document your independent review of radiology images, and any outside records:1} {Document your discussion with family members, caretakers, and with consultants:1} {Document social determinants of health affecting pt's care:1} {Document your decision making why or why not admission, treatments were needed:1} Final Clinical Impression(s) / ED Diagnoses Final diagnoses:  None    Rx / DC Orders ED Discharge Orders     None

## 2023-12-11 NOTE — ED Triage Notes (Signed)
 Patient BIB EMS c/o unwitnessed fall from [facility]. Patient report he fell in a slope while walking his dog outside. Patient was stuck for 1 hour in a slope before bystanders found him.  100 mcg fentanyl I given by EMS. Patient denies LOC. Patient c/o 10/10 pain on his Right posterior hip. Patient denies taking blood thinners.

## 2023-12-12 ENCOUNTER — Inpatient Hospital Stay (HOSPITAL_COMMUNITY)

## 2023-12-12 ENCOUNTER — Encounter (HOSPITAL_COMMUNITY): Payer: Self-pay | Admitting: Family Medicine

## 2023-12-12 ENCOUNTER — Other Ambulatory Visit: Payer: Self-pay

## 2023-12-12 ENCOUNTER — Inpatient Hospital Stay (HOSPITAL_COMMUNITY): Admitting: Anesthesiology

## 2023-12-12 ENCOUNTER — Encounter (HOSPITAL_COMMUNITY)
Admission: EM | Disposition: A | Discharge status: Skilled Nursing Facility | Payer: Self-pay | Source: Skilled Nursing Facility | Attending: Internal Medicine

## 2023-12-12 DIAGNOSIS — I5032 Chronic diastolic (congestive) heart failure: Secondary | ICD-10-CM

## 2023-12-12 DIAGNOSIS — Z8673 Personal history of transient ischemic attack (TIA), and cerebral infarction without residual deficits: Secondary | ICD-10-CM | POA: Diagnosis not present

## 2023-12-12 DIAGNOSIS — I951 Orthostatic hypotension: Secondary | ICD-10-CM | POA: Diagnosis present

## 2023-12-12 DIAGNOSIS — M25551 Pain in right hip: Secondary | ICD-10-CM | POA: Diagnosis present

## 2023-12-12 DIAGNOSIS — E782 Mixed hyperlipidemia: Secondary | ICD-10-CM | POA: Diagnosis present

## 2023-12-12 DIAGNOSIS — D62 Acute posthemorrhagic anemia: Secondary | ICD-10-CM | POA: Diagnosis not present

## 2023-12-12 DIAGNOSIS — N400 Enlarged prostate without lower urinary tract symptoms: Secondary | ICD-10-CM | POA: Diagnosis present

## 2023-12-12 DIAGNOSIS — F39 Unspecified mood [affective] disorder: Secondary | ICD-10-CM | POA: Diagnosis present

## 2023-12-12 DIAGNOSIS — Z8249 Family history of ischemic heart disease and other diseases of the circulatory system: Secondary | ICD-10-CM | POA: Diagnosis not present

## 2023-12-12 DIAGNOSIS — Z87891 Personal history of nicotine dependence: Secondary | ICD-10-CM | POA: Diagnosis not present

## 2023-12-12 DIAGNOSIS — S72141A Displaced intertrochanteric fracture of right femur, initial encounter for closed fracture: Secondary | ICD-10-CM

## 2023-12-12 DIAGNOSIS — Z8551 Personal history of malignant neoplasm of bladder: Secondary | ICD-10-CM | POA: Diagnosis not present

## 2023-12-12 DIAGNOSIS — J432 Centrilobular emphysema: Secondary | ICD-10-CM | POA: Diagnosis present

## 2023-12-12 DIAGNOSIS — Z950 Presence of cardiac pacemaker: Secondary | ICD-10-CM | POA: Diagnosis not present

## 2023-12-12 DIAGNOSIS — I447 Left bundle-branch block, unspecified: Secondary | ICD-10-CM | POA: Diagnosis present

## 2023-12-12 DIAGNOSIS — W010XXA Fall on same level from slipping, tripping and stumbling without subsequent striking against object, initial encounter: Secondary | ICD-10-CM | POA: Diagnosis present

## 2023-12-12 DIAGNOSIS — I110 Hypertensive heart disease with heart failure: Secondary | ICD-10-CM | POA: Diagnosis present

## 2023-12-12 DIAGNOSIS — I509 Heart failure, unspecified: Secondary | ICD-10-CM | POA: Diagnosis not present

## 2023-12-12 DIAGNOSIS — Z833 Family history of diabetes mellitus: Secondary | ICD-10-CM | POA: Diagnosis not present

## 2023-12-12 DIAGNOSIS — D649 Anemia, unspecified: Secondary | ICD-10-CM

## 2023-12-12 DIAGNOSIS — S72001A Fracture of unspecified part of neck of right femur, initial encounter for closed fracture: Secondary | ICD-10-CM

## 2023-12-12 DIAGNOSIS — I2510 Atherosclerotic heart disease of native coronary artery without angina pectoris: Secondary | ICD-10-CM | POA: Diagnosis present

## 2023-12-12 DIAGNOSIS — Y92098 Other place in other non-institutional residence as the place of occurrence of the external cause: Secondary | ICD-10-CM | POA: Diagnosis not present

## 2023-12-12 DIAGNOSIS — Z66 Do not resuscitate: Secondary | ICD-10-CM | POA: Diagnosis present

## 2023-12-12 DIAGNOSIS — Z823 Family history of stroke: Secondary | ICD-10-CM | POA: Diagnosis not present

## 2023-12-12 DIAGNOSIS — K2100 Gastro-esophageal reflux disease with esophagitis, without bleeding: Secondary | ICD-10-CM | POA: Diagnosis present

## 2023-12-12 DIAGNOSIS — E063 Autoimmune thyroiditis: Secondary | ICD-10-CM | POA: Diagnosis present

## 2023-12-12 DIAGNOSIS — S72002A Fracture of unspecified part of neck of left femur, initial encounter for closed fracture: Secondary | ICD-10-CM | POA: Diagnosis present

## 2023-12-12 DIAGNOSIS — Y93K1 Activity, walking an animal: Secondary | ICD-10-CM | POA: Diagnosis not present

## 2023-12-12 DIAGNOSIS — K219 Gastro-esophageal reflux disease without esophagitis: Secondary | ICD-10-CM | POA: Diagnosis present

## 2023-12-12 DIAGNOSIS — Z79899 Other long term (current) drug therapy: Secondary | ICD-10-CM | POA: Diagnosis not present

## 2023-12-12 HISTORY — DX: Anemia, unspecified: D64.9

## 2023-12-12 HISTORY — DX: Chronic diastolic (congestive) heart failure: I50.32

## 2023-12-12 HISTORY — PX: INTRAMEDULLARY (IM) NAIL INTERTROCHANTERIC: SHX5875

## 2023-12-12 LAB — RETICULOCYTES
Immature Retic Fract: 15.8 % (ref 2.3–15.9)
RBC.: 3.46 MIL/uL — ABNORMAL LOW (ref 4.22–5.81)
Retic Count, Absolute: 42.6 10*3/uL (ref 19.0–186.0)
Retic Ct Pct: 1.2 % (ref 0.4–3.1)

## 2023-12-12 LAB — TYPE AND SCREEN
ABO/RH(D): O POS
ABO/RH(D): O POS
Antibody Screen: NEGATIVE

## 2023-12-12 LAB — CBC
HCT: 31 % — ABNORMAL LOW (ref 39.0–52.0)
Hemoglobin: 9.7 g/dL — ABNORMAL LOW (ref 13.0–17.0)
MCH: 27.9 pg (ref 26.0–34.0)
MCHC: 31.3 g/dL (ref 30.0–36.0)
MCV: 89.1 fL (ref 80.0–100.0)
Platelets: 188 10*3/uL (ref 150–400)
RBC: 3.48 MIL/uL — ABNORMAL LOW (ref 4.22–5.81)
RDW: 15.9 % — ABNORMAL HIGH (ref 11.5–15.5)
WBC: 7.9 10*3/uL (ref 4.0–10.5)
nRBC: 0 % (ref 0.0–0.2)

## 2023-12-12 LAB — VITAMIN B12: Vitamin B-12: 1366 pg/mL — ABNORMAL HIGH (ref 180–914)

## 2023-12-12 LAB — IRON AND TIBC
Iron: 48 ug/dL (ref 45–182)
Saturation Ratios: 14 % — ABNORMAL LOW (ref 17.9–39.5)
TIBC: 337 ug/dL (ref 250–450)
UIBC: 289 ug/dL

## 2023-12-12 LAB — BASIC METABOLIC PANEL
Anion gap: 8 (ref 5–15)
BUN: 21 mg/dL (ref 8–23)
CO2: 21 mmol/L — ABNORMAL LOW (ref 22–32)
Calcium: 8.5 mg/dL — ABNORMAL LOW (ref 8.9–10.3)
Chloride: 108 mmol/L (ref 98–111)
Creatinine, Ser: 0.84 mg/dL (ref 0.61–1.24)
GFR, Estimated: >60 mL/min (ref >60)
Glucose, Bld: 101 mg/dL — ABNORMAL HIGH (ref 70–99)
Potassium: 3.9 mmol/L (ref 3.5–5.1)
Sodium: 137 mmol/L (ref 135–145)

## 2023-12-12 LAB — FOLATE: Folate: 21.5 ng/mL (ref >5.9)

## 2023-12-12 LAB — FERRITIN: Ferritin: 17 ng/mL — ABNORMAL LOW (ref 24–336)

## 2023-12-12 SURGERY — FIXATION, FRACTURE, INTERTROCHANTERIC, WITH INTRAMEDULLARY ROD
Anesthesia: Spinal | Laterality: Right

## 2023-12-12 MED ORDER — ACETAMINOPHEN 325 MG PO TABS
650.0000 mg | ORAL_TABLET | Freq: Four times a day (QID) | ORAL | Status: DC | PRN
  Filled 2023-12-12: qty 2

## 2023-12-12 MED ORDER — CEFAZOLIN SODIUM-DEXTROSE 2-4 GM/100ML-% IV SOLN
2.0000 g | Freq: Three times a day (TID) | INTRAVENOUS | Status: AC
  Administered 2023-12-12 – 2023-12-13 (×3): 2 g via INTRAVENOUS
  Filled 2023-12-12 (×3): qty 100

## 2023-12-12 MED ORDER — 0.9 % SODIUM CHLORIDE (POUR BTL) OPTIME
TOPICAL | Status: DC | PRN
  Administered 2023-12-12: 1000 mL

## 2023-12-12 MED ORDER — DOCUSATE SODIUM 100 MG PO CAPS
100.0000 mg | ORAL_CAPSULE | Freq: Two times a day (BID) | ORAL | Status: DC
  Administered 2023-12-12 – 2023-12-15 (×8): 100 mg via ORAL
  Filled 2023-12-12 (×8): qty 1

## 2023-12-12 MED ORDER — FENTANYL CITRATE PF 50 MCG/ML IJ SOSY
12.5000 ug | PREFILLED_SYRINGE | INTRAMUSCULAR | Status: DC | PRN
  Administered 2023-12-12 (×3): 50 ug via INTRAVENOUS
  Filled 2023-12-12 (×3): qty 1

## 2023-12-12 MED ORDER — PROPOFOL 10 MG/ML IV BOLUS
INTRAVENOUS | Status: AC
  Filled 2023-12-12: qty 20

## 2023-12-12 MED ORDER — PROPOFOL 1000 MG/100ML IV EMUL
INTRAVENOUS | Status: AC
  Filled 2023-12-12: qty 100

## 2023-12-12 MED ORDER — CEFAZOLIN SODIUM-DEXTROSE 2-4 GM/100ML-% IV SOLN
INTRAVENOUS | Status: AC
  Filled 2023-12-12: qty 100

## 2023-12-12 MED ORDER — ATORVASTATIN CALCIUM 40 MG PO TABS
40.0000 mg | ORAL_TABLET | Freq: Every day | ORAL | Status: DC
  Administered 2023-12-13 – 2023-12-17 (×5): 40 mg via ORAL
  Filled 2023-12-12 (×5): qty 1

## 2023-12-12 MED ORDER — PHENYLEPHRINE HCL-NACL 20-0.9 MG/250ML-% IV SOLN
INTRAVENOUS | Status: DC | PRN
  Administered 2023-12-12: 25 ug/min via INTRAVENOUS

## 2023-12-12 MED ORDER — ORAL CARE MOUTH RINSE: 15.0000 mL | Freq: Once | OROMUCOSAL | Status: AC

## 2023-12-12 MED ORDER — FENTANYL CITRATE (PF) 250 MCG/5ML IJ SOLN
INTRAMUSCULAR | Status: DC | PRN
  Administered 2023-12-12: 25 ug via INTRAVENOUS

## 2023-12-12 MED ORDER — BUPIVACAINE HCL (PF) 0.5 % IJ SOLN
INTRAMUSCULAR | Status: DC | PRN
Start: 2023-12-12 — End: 2023-12-12
  Administered 2023-12-12: 10 mg via INTRATHECAL

## 2023-12-12 MED ORDER — POLYETHYLENE GLYCOL 3350 17 G PO PACK
17.0000 g | PACK | Freq: Every day | ORAL | Status: DC | PRN
  Administered 2023-12-14 – 2023-12-17 (×2): 17 g via ORAL
  Filled 2023-12-12 (×3): qty 1

## 2023-12-12 MED ORDER — PROCHLORPERAZINE EDISYLATE 10 MG/2ML IJ SOLN: 5.0000 mg | Freq: Four times a day (QID) | INTRAMUSCULAR | Status: DC | PRN

## 2023-12-12 MED ORDER — FENTANYL CITRATE (PF) 250 MCG/5ML IJ SOLN
INTRAMUSCULAR | Status: AC
  Filled 2023-12-12: qty 5

## 2023-12-12 MED ORDER — METHOCARBAMOL 500 MG PO TABS
500.0000 mg | ORAL_TABLET | Freq: Four times a day (QID) | ORAL | Status: DC | PRN
  Administered 2023-12-12 – 2023-12-13 (×3): 500 mg via ORAL
  Filled 2023-12-12 (×3): qty 1

## 2023-12-12 MED ORDER — TAMSULOSIN HCL 0.4 MG PO CAPS
0.4000 mg | ORAL_CAPSULE | Freq: Every day | ORAL | Status: DC
  Administered 2023-12-13 – 2023-12-15 (×3): 0.4 mg via ORAL
  Filled 2023-12-12 (×3): qty 1

## 2023-12-12 MED ORDER — ASPIRIN 325 MG PO TABS
325.0000 mg | ORAL_TABLET | Freq: Every day | ORAL | Status: DC
  Administered 2023-12-13 – 2023-12-17 (×5): 325 mg via ORAL
  Filled 2023-12-12 (×5): qty 1

## 2023-12-12 MED ORDER — PANTOPRAZOLE SODIUM 40 MG PO TBEC
40.0000 mg | DELAYED_RELEASE_TABLET | Freq: Every day | ORAL | Status: DC
  Administered 2023-12-13 – 2023-12-17 (×5): 40 mg via ORAL
  Filled 2023-12-12 (×5): qty 1

## 2023-12-12 MED ORDER — OXYCODONE HCL 5 MG PO TABS
5.0000 mg | ORAL_TABLET | ORAL | Status: DC | PRN
  Administered 2023-12-12 – 2023-12-17 (×3): 5 mg via ORAL
  Filled 2023-12-12 (×4): qty 1

## 2023-12-12 MED ORDER — METHOCARBAMOL 1000 MG/10ML IJ SOLN: 500.0000 mg | Freq: Four times a day (QID) | INTRAMUSCULAR | Status: DC | PRN

## 2023-12-12 MED ORDER — PROPOFOL 500 MG/50ML IV EMUL
INTRAVENOUS | Status: DC | PRN
  Administered 2023-12-12: 25 ug/kg/min via INTRAVENOUS

## 2023-12-12 MED ORDER — PROPOFOL 10 MG/ML IV BOLUS
INTRAVENOUS | Status: DC | PRN
  Administered 2023-12-12: 40 mg via INTRAVENOUS

## 2023-12-12 MED ORDER — ALBUMIN HUMAN 5 % IV SOLN: INTRAVENOUS | Status: DC | PRN

## 2023-12-12 MED ORDER — CEFAZOLIN SODIUM-DEXTROSE 2-4 GM/100ML-% IV SOLN
2.0000 g | Freq: Once | INTRAVENOUS | Status: AC
  Administered 2023-12-12: 2 g via INTRAVENOUS

## 2023-12-12 MED ORDER — SODIUM CHLORIDE 0.9 % IV SOLN: INTRAVENOUS | Status: DC

## 2023-12-12 MED ORDER — CHLORHEXIDINE GLUCONATE 0.12 % MT SOLN
15.0000 mL | Freq: Once | OROMUCOSAL | Status: AC
  Administered 2023-12-12: 15 mL via OROMUCOSAL

## 2023-12-12 MED ORDER — LACTATED RINGERS IV SOLN: INTRAVENOUS | Status: DC

## 2023-12-12 SURGICAL SUPPLY — 43 items
BAG COUNTER SPONGE SURGICOUNT (BAG) IMPLANT
BIT DRILL INTERTAN LAG SCREW (BIT) IMPLANT
BIT DRILL LONG 4.0 (BIT) IMPLANT
BRUSH SCRUB EZ PLAIN DRY (MISCELLANEOUS) ×2 IMPLANT
CHLORAPREP W/TINT 26 (MISCELLANEOUS) ×1 IMPLANT
COVER PERINEAL POST (MISCELLANEOUS) ×1 IMPLANT
COVER SURGICAL LIGHT HANDLE (MISCELLANEOUS) ×1 IMPLANT
DERMABOND ADVANCED .7 DNX12 (GAUZE/BANDAGES/DRESSINGS) ×1 IMPLANT
DRAPE C-ARM 35X43 STRL (DRAPES) ×1 IMPLANT
DRAPE IMP U-DRAPE 54X76 (DRAPES) ×2 IMPLANT
DRAPE INCISE IOBAN 66X45 STRL (DRAPES) ×1 IMPLANT
DRAPE STERI IOBAN 125X83 (DRAPES) ×1 IMPLANT
DRAPE SURG 17X23 STRL (DRAPES) ×2 IMPLANT
DRAPE U-SHAPE 47X51 STRL (DRAPES) ×1 IMPLANT
DRESSING MEPILEX FLEX 4X4 (GAUZE/BANDAGES/DRESSINGS) ×1 IMPLANT
DRILL BIT LONG 4.0 (BIT) ×1 IMPLANT
DRSG MEPILEX FLEX 4X4 (GAUZE/BANDAGES/DRESSINGS) ×1 IMPLANT
DRSG MEPILEX POST OP 4X8 (GAUZE/BANDAGES/DRESSINGS) ×1 IMPLANT
ELECT REM PT RETURN 9FT ADLT (ELECTROSURGICAL) ×1 IMPLANT
ELECTRODE REM PT RTRN 9FT ADLT (ELECTROSURGICAL) ×1 IMPLANT
GLOVE BIO SURGEON STRL SZ 6.5 (GLOVE) ×3 IMPLANT
GLOVE BIO SURGEON STRL SZ7.5 (GLOVE) ×4 IMPLANT
GLOVE BIOGEL PI IND STRL 6.5 (GLOVE) ×1 IMPLANT
GLOVE BIOGEL PI IND STRL 7.5 (GLOVE) ×1 IMPLANT
GOWN STRL REUS W/ TWL LRG LVL3 (GOWN DISPOSABLE) ×1 IMPLANT
GUIDE PIN 3.2X343 (PIN) ×2 IMPLANT
KIT BASIN OR (CUSTOM PROCEDURE TRAY) ×1 IMPLANT
KIT TURNOVER KIT B (KITS) ×1 IMPLANT
MANIFOLD NEPTUNE II (INSTRUMENTS) ×1 IMPLANT
NAIL INTERTAN 10X18 130D 10S (Nail) IMPLANT
NS IRRIG 1000ML POUR BTL (IV SOLUTION) ×1 IMPLANT
PACK GENERAL/GYN (CUSTOM PROCEDURE TRAY) ×1 IMPLANT
PAD ARMBOARD POSITIONER FOAM (MISCELLANEOUS) ×2 IMPLANT
PIN GUIDE 3.2X343MM (PIN) IMPLANT
SCREW LAG COMPR KIT 105/100 (Screw) IMPLANT
SCREW TRIGEN LOW PROF 5.0X32.5 (Screw) IMPLANT
SUT MNCRL AB 3-0 PS2 18 (SUTURE) ×1 IMPLANT
SUT MNCRL AB 3-0 PS2 27 (SUTURE) IMPLANT
SUT MON AB 2-0 CT1 36 (SUTURE) IMPLANT
SUT VIC AB 0 CT1 27XBRD ANBCTR (SUTURE) IMPLANT
SUT VIC AB 2-0 CT1 TAPERPNT 27 (SUTURE) ×2 IMPLANT
TOWEL GREEN STERILE (TOWEL DISPOSABLE) ×2 IMPLANT
WATER STERILE IRR 1000ML POUR (IV SOLUTION) ×1 IMPLANT

## 2023-12-12 NOTE — Transfer of Care (Signed)
 Immediate Anesthesia Transfer of Care Note  Patient: Oscar [name]  Procedure(s) Performed: FIXATION, FRACTURE, INTERTROCHANTERIC, WITH INTRAMEDULLARY ROD (Right)  Patient Location: PACU  Anesthesia Type:Spinal  Level of Consciousness: awake, patient cooperative, and responds to stimulation  Airway & Oxygen Therapy: Patient Spontanous Breathing and Patient connected to face mask oxygen  Post-op Assessment: Report given to RN and Post -op Vital signs reviewed and stable  Post vital signs: Reviewed and stable  Last Vitals:  Vitals Value Taken Time  BP 102/58 12/12/23 1119  Temp    Pulse    Resp    SpO2    Vitals shown include unfiled device data.  Last Pain:  Vitals:   12/12/23 0940  TempSrc: Oral  PainSc:          Complications: No notable events documented.

## 2023-12-12 NOTE — Interval H&P Note (Signed)
 History and Physical Interval Note:  12/12/2023 9:53 AM  Oscar [name]  has presented today for surgery, with the diagnosis of Right intertrochanteric femur fracture.  The various methods of treatment have been discussed with the patient and family. After consideration of risks, benefits and other options for treatment, the patient has consented to  Procedure(s): FIXATION, FRACTURE, INTERTROCHANTERIC, WITH INTRAMEDULLARY ROD (Right) as a surgical intervention.  The patient's history has been reviewed, patient examined, no change in status, stable for surgery.  I have reviewed the patient's chart and labs.  Questions were answered to the patient's satisfaction.     Caryn Bee P Kaileen Bronkema

## 2023-12-12 NOTE — TOC Initial Note (Signed)
 [facility]) - Initial/Assessment Note    Patient Details  Name: Oscar [name] MRN: [id number] Date of Birth: 1931/09/03  [facility]) CM/SW Contact:    Marliss Coots, LCSW Phone Number: 12/12/2023, 11:59 AM  Clinical Narrative:                  11:59 AM Per chart review, patient is from [facility].    Barriers to Discharge: Continued Medical Work up   Patient Goals and CMS Choice            Expected Discharge Plan and Services       Living arrangements for the past 2 months: Assisted Living Facility                                      Prior Living Arrangements/Services Living arrangements for the past 2 months: Assisted Living Facility Lives with:: Facility Resident Patient language and need for interpreter reviewed:: Yes              Criminal Activity/Legal Involvement Pertinent to Current Situation/Hospitalization: No - Comment as needed  Activities of Daily Living   ADL Screening (condition at time of admission) Independently performs ADLs?: Yes (appropriate for developmental age) Is the patient deaf or have difficulty hearing?: No Does the patient have difficulty seeing, even when wearing glasses/contacts?: No Does the patient have difficulty concentrating, remembering, or making decisions?: Yes (Some memory)  Permission Sought/Granted Permission sought to share information with : Family Supports, Oceanographer granted to share information with : No (Contact information on chart)  Share Information with NAME: Rhett Bannister  Permission granted to share info w AGENCY: [facility]  Permission granted to share info w Relationship: Daughter  Permission granted to share info w Contact Information: [phone]  Emotional Assessment         Alcohol / Substance Use: Not Applicable Psych Involvement: No (comment)  Admission diagnosis:  Normochromic normocytic anemia  [D64.9] Elevated random blood glucose level [R73.9] Closed intertrochanteric fracture of right hip, initial encounter (HCC) [S72.141A] Closed right hip fracture, initial encounter (HCC) [S72.001A] Patient Active Problem List   Diagnosis Date Noted   Closed right hip fracture, initial encounter (HCC) 12/12/2023   Normocytic anemia 12/12/2023   Chronic heart failure with preserved ejection fraction (HFpEF) (HCC) 12/12/2023   OAB (overactive bladder) 11/19/2023   Benign prostatic hyperplasia with nocturia 11/19/2023   Hypercholesterolemia 10/09/2021   Bladder tumor 01/04/2021   Cervical stenosis of spine 01/04/2021   GERD (gastroesophageal reflux disease) 01/04/2021   LBBB (left bundle branch block) 01/04/2021   Ascending aorta dilatation (HCC) 12/07/2020   Apneic episode 11/29/2020   Centrilobular emphysema (HCC) 11/26/2020   Presence of permanent cardiac pacemaker 11/15/2020   Hyponatremia 11/13/2020   E. coli infection    History of appendectomy    History of concussion    History of peptic ulcer    Irregular heart beat    CHB (complete heart block) (HCC) 09/28/2020   Hospital discharge follow-up 03/28/2020   CVA (cerebral vascular accident) (HCC) 03/12/2020   Acute CVA (cerebrovascular accident) (HCC) 03/12/2020   Hypothyroidism (acquired) 03/12/2020   Essential hypertension 03/12/2020   Atrial tachycardia (HCC) 01/24/2020   Exertional dyspnea 01/18/2020   Atrial tachycardia, paroxysmal (HCC) 08/05/2019   Coronary artery disease involving native coronary artery of native heart without angina pectoris 08/05/2019  Hypothyroidism due to Hashimoto's thyroiditis 07/23/2019   Abnormal nuclear stress test    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   Cervical myelopathy (HCC) 01/27/2018   Hyperlipidemia 11/20/2017   Bunion of great toe of right foot 09/18/2016   S/P bilateral cataract extraction 09/18/2016   Encounter for general adult medical examination  without abnormal findings 05/20/2016   Status post placement of implantable loop recorder 11/08/2014   Near syncope 05/16/2014   TIA (transient ischemic attack) 04/09/2014   History of bladder cancer 03/14/2014   Abnormal results of thyroid function studies 01/19/2014   Personal history of tobacco use, presenting hazards to health 01/12/2014   Transient cerebral ischemia 01/12/2014   Mixed hyperlipidemia 11/24/2013   Adult body mass index 26.0-26.9 06/07/2013   Cancer of bladder (HCC) 02/10/2013   Serum reaction due to vaccination 09/27/2011   Gastro-esophageal reflux disease with esophagitis 09/28/2010   PCP:  Sharlene Dory, DO Pharmacy:   [facility] [address] [place], [place] - [address] [place] [address] Phone: [phone] Fax: [phone]  [facility] [place], [place] - [address] [place] [address] Phone: [phone] Fax: [phone]  [facility] [address] [place] [address] Phone: [phone] Fax: [phone]     Social Drivers of Health (SDOH) Social History: SDOH Screenings   Transportation Needs: No Transportation Needs (04/18/2020)  Depression (PHQ2-9): Low Risk  (11/19/2023)  Tobacco Use: Medium Risk (12/12/2023)   SDOH Interventions:     Readmission Risk Interventions     No data to display

## 2023-12-12 NOTE — Anesthesia Procedure Notes (Signed)
 Spinal  Patient location during procedure: OB Start time: 12/12/2023 10:28 AM End time: 12/12/2023 10:34 AM Reason for block: surgical anesthesia Staffing Performed: anesthesiologist  Anesthesiologist: Trevor Iha, MD Performed by: Trevor Iha, MD Authorized by: Trevor Iha, MD   Preanesthetic Checklist Completed: patient identified, IV checked, risks and benefits discussed, surgical consent, monitors and equipment checked, pre-op evaluation and timeout performed Spinal Block Patient position: left lateral decubitus Prep: DuraPrep and site prepped and draped Patient monitoring: heart rate, cardiac monitor, continuous pulse ox and blood pressure Approach: midline Location: L3-4 Injection technique: single-shot Needle Needle type: Pencan  Needle gauge: 24 G Needle length: 10 cm Assessment Sensory level: T4 Events: CSF return Additional Notes  2 Attempt (s). Pt tolerated procedure well.

## 2023-12-12 NOTE — H&P (View-Only) (Signed)
 Orthopaedic Trauma Service (OTS) Consult   Patient ID: Oscar [name] MRN: [id number] DOB/AGE: 1931/03/26 89 y.o.  Reason for Consult:Right intertrochanteric femur fracture Referring Physician: Dr. Dione Booze, MD [facility]  HPI: Oscar [name] is an 89 y.o. male who is being seen in consultation at the request of Dr. Preston Fleeting for evaluation of right intertrochanteric femur fracture.  Patient sustained a ground-level fall and had a right intertrochanteric femur fracture.  Orthopedics consulted.  He was at [facility] due to the OR availability I asked that he be transferred over to [facility].  He was seen and evaluated in the preoperative holding area.  Denies any injuries other than his right hip.  He is an independent living facility.  He does not ambulate with any assist device.  Past Medical History:  Diagnosis Date   Abnormal nuclear stress test    Abnormal results of thyroid function studies 01/19/2014   Formatting of this note might be different from the original. August 2016  TSH 4.01   Apneic episode 11/29/2020   Ascending aorta dilatation (HCC) 12/07/2020   Atrial tachycardia, paroxysmal (HCC) 08/05/2019   Bunion of great toe of right foot 09/18/2016   Centrilobular emphysema (HCC) 11/26/2020   Cervical stenosis of spine    CHB (complete heart block) (HCC) 09/28/2020   Coronary artery disease involving native coronary artery of native heart without angina pectoris 08/05/2019   CVA (cerebral vascular accident) (HCC) 03/12/2020   Encounter for general adult medical examination without abnormal findings 05/20/2016   Essential hypertension 03/12/2020   Exertional dyspnea 01/18/2020   Gastro-esophageal reflux disease with esophagitis 09/28/2010   GERD (gastroesophageal reflux disease)    History of bladder cancer 03/14/2014   History of concussion    X2   NO RESIDUALS   History of peptic ulcer 1970'S   Hypercholesterolemia    Hypothyroidism due to Hashimoto's  thyroiditis 07/23/2019   LBBB (left bundle branch block)    Macular degeneration of left eye    Mixed hyperlipidemia 11/24/2013   Formatting of this note might be different from the original. History of TIA.  The goal is to have your total cholesterol < 200, the HDL (good cholesterol) >40, and the LDL (bad cholesterol) <100. It is recomended that you follow a good low fat diet and exercise for 30 minutes 3-4 times a week.   Personal history of tobacco use, presenting hazards to health 01/12/2014   Presence of permanent cardiac pacemaker 11/15/2020   S/P bilateral cataract extraction 09/18/2016   Serum reaction due to vaccination 09/27/2011   Formatting of this note might be different from the original. IMPRESSION: seen on Wednesday for reaction to right arm after pneumovax, had redness, warmth , tenderness and edema of right upper arm which is now resolved. monitor area. use warm compresses prn. call prn. discussed will not need another pneumovax, he will consider zostavax in future.   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   Status post placement of implantable loop recorder 11/08/2014   TIA (transient ischemic attack) 04/09/2014    Past Surgical History:  Procedure Laterality Date   ANTERIOR CERVICAL DECOMP/DISCECTOMY FUSION N/A 01/27/2018   Procedure: Anterior Cervical Discectomy Fusion - Cervical Four- Cervical Five;  Surgeon: Julio Sicks, MD;  Location: [facility] OR;  Service: Neurosurgery;  Laterality: N/A;  Anterior Cervical Discectomy Fusion - Cervical Four- Cervical Five   APPENDECTOMY  1970   CARDIOVASCULAR STRESS TEST  03/22/09   CYSTOSCOPY WITH BIOPSY N/A 02/10/2013  Procedure: CYSTOSCOPY WITH COLD CUP BIOPSY/FULGERATION;  Surgeon: Valetta Fuller, MD;  [facility]: [facility];  Service: Urology;  Laterality: N/A;  ALSO FULGERATION    INGUINAL HERNIA REPAIR Right 07-14-2002   LEFT HEART CATH AND CORONARY ANGIOGRAPHY N/A 07/13/2019   Procedure: LEFT HEART  CATH AND CORONARY ANGIOGRAPHY;  Surgeon: Lennette Bihari, MD;  Location: [facility] INVASIVE CV LAB;  Service: Cardiovascular;  Laterality: N/A;   LOOP RECORDER IMPLANT N/A 05/24/2014   Procedure: LOOP RECORDER IMPLANT;  Surgeon: Thurmon Fair, MD;  Location: [facility] CATH LAB;  Service: Cardiovascular;  Laterality: N/A;   LOOP RECORDER REMOVAL N/A 10/02/2020   Procedure: LOOP RECORDER REMOVAL;  Surgeon: Thurmon Fair, MD;  Location: [facility];  Service: Cardiovascular;  Laterality: N/A;   NECK SURGERY     PACEMAKER IMPLANT N/A 10/02/2020   Procedure: PACEMAKER IMPLANT;  Surgeon: Thurmon Fair, MD;  Location: [facility];  Service: Cardiovascular;  Laterality: N/A;   TONSILLECTOMY     TRANSURETHRAL RESECTION OF BLADDER TUMOR  03-10-2008   TRANSURETHRAL RESECTION OF BLADDER TUMOR Bilateral 12/28/2020   Procedure: TRANSURETHRAL RESECTION OF BLADDER TUMOR BLADDER BIOPSY FULGARATION BILATERAL RETROGRADE PYELOGRAM(TURBT);  Surgeon: Crist Fat, MD;  Location: [facility] ORS;  Service: Urology;  Laterality: Bilateral;   TUMOR EXCISION Left    Left arm    Family History  Problem Relation Age of Onset   Heart failure Mother    Stroke Father    Diabetes Brother    Heart failure Brother     Social History:  reports that he quit smoking about 44 years ago. His smoking use included cigarettes. He started smoking about 74 years ago. He has a 15 pack-year smoking history. He has never used smokeless tobacco. He reports current alcohol use. He reports that he does not use drugs.  Allergies: No Known Allergies  Medications:  No current facility-administered medications on file prior to encounter.   Current Outpatient Medications on File Prior to Encounter  Medication Sig Dispense Refill   acetaminophen (TYLENOL) 325 MG tablet Take 650 mg by mouth every 6 (six) hours as needed for mild pain (pain score 1-3) or moderate pain (pain score 4-6).     Cholecalciferol 125 MCG (5000 UT) TABS Take 1 tablet  by mouth daily.     dorzolamide-timolol (COSOPT) 2-0.5 % ophthalmic solution Place 1 drop into the left eye 2 (two) times daily.     famotidine (PEPCID) 20 MG tablet Take 20 mg by mouth daily.     ferrous sulfate 325 (65 FE) MG EC tablet Take 1 tablet by mouth 3 (three) times a week.     furosemide (LASIX) 20 MG tablet Take 1 tablet (20 mg total) by mouth daily. 90 tablet 2   pantoprazole (PROTONIX) 40 MG tablet Take 1 tablet (40 mg total) by mouth daily. 90 tablet 2   tamsulosin (FLOMAX) 0.4 MG CAPS capsule Take 1 capsule (0.4 mg total) by mouth daily. 90 capsule 2   VYZULTA 0.024 % SOLN Place 1 drop into the left eye at bedtime.     atorvastatin (LIPITOR) 40 MG tablet Take 1 tablet (40 mg total) by mouth daily. (Patient not taking: Reported on 12/12/2023) 90 tablet 3   carvedilol (COREG) 3.125 MG tablet Take 3.125 mg by mouth 2 (two) times daily with a meal. (Patient not taking: Reported on 12/12/2023)     sertraline (ZOLOFT) 50 MG tablet Take 50 mg by mouth daily. (Patient not taking: Reported on 12/12/2023)  Vibegron (GEMTESA) 75 MG TABS Take 1 tab by mouth daily. (Patient not taking: Reported on 12/12/2023) 90 tablet 2     ROS: Constitutional: No fever or chills Vision: No changes in vision ENT: No difficulty swallowing CV: No chest pain Pulm: No SOB or wheezing GI: No nausea or vomiting GU: No urgency or inability to hold urine Skin: No poor wound healing Neurologic: No numbness or tingling Psychiatric: No depression or anxiety Heme: No bruising Allergic: No reaction to medications or food   Exam: Blood pressure 132/85, pulse 76, temperature 97.8 F (36.6 C), temperature source Oral, resp. rate 18, height 5\' 8"  (1.727 m), weight 63.5 kg, SpO2 96%. General: No acute distress Orientation: Awake alert and oriented x 3 Mood and Affect: Cooperative and pleasant Gait: Unable to assess due to his fracture Coordination and balance: Within normal limits   Right lower extremity: Skin  is without lesions.  No deformity.  Leg is shortened and externally rotated slight amount.  He has soft and compressible compartments.  He has intact motor and sensory function distally.  He is warm well-perfused foot with 2+ DP pulses.  Left lower extremity skin without lesions. No tenderness to palpation. Full painless ROM, full strength in each muscle groups without evidence of instability.   Medical Decision Making: Data: Imaging: X-rays were reviewed which shows a right intertrochanteric femur fracture with mild displacement  Labs:  Results for orders placed or performed during the hospital encounter of 12/11/23 (from the past 24 hours)  Basic metabolic panel     Status: Abnormal   Collection Time: 12/11/23 11:18 PM  Result Value Ref Range   Sodium 137 135 - 145 mmol/L   Potassium 3.7 3.5 - 5.1 mmol/L   Chloride 107 98 - 111 mmol/L   CO2 20 (L) 22 - 32 mmol/L   Glucose, Bld 118 (H) 70 - 99 mg/dL   BUN 25 (H) 8 - 23 mg/dL   Creatinine, Ser 4.25 0.61 - 1.24 mg/dL   Calcium 8.4 (L) 8.9 - 10.3 mg/dL   GFR, Estimated >95 >63 mL/min   Anion gap 10 5 - 15  CBC with Differential     Status: Abnormal   Collection Time: 12/11/23 11:18 PM  Result Value Ref Range   WBC 8.1 4.0 - 10.5 K/uL   RBC 3.12 (L) 4.22 - 5.81 MIL/uL   Hemoglobin 8.6 (L) 13.0 - 17.0 g/dL   HCT 87.5 (L) 64.3 - 32.9 %   MCV 91.0 80.0 - 100.0 fL   MCH 27.6 26.0 - 34.0 pg   MCHC 30.3 30.0 - 36.0 g/dL   RDW 51.8 (H) 84.1 - 66.0 %   Platelets 156 150 - 400 K/uL   nRBC 0.0 0.0 - 0.2 %   Neutrophils Relative % 84 %   Neutro Abs 6.8 1.7 - 7.7 K/uL   Lymphocytes Relative 7 %   Lymphs Abs 0.6 (L) 0.7 - 4.0 K/uL   Monocytes Relative 7 %   Monocytes Absolute 0.6 0.1 - 1.0 K/uL   Eosinophils Relative 1 %   Eosinophils Absolute 0.1 0.0 - 0.5 K/uL   Basophils Relative 0 %   Basophils Absolute 0.0 0.0 - 0.1 K/uL   Immature Granulocytes 1 %   Abs Immature Granulocytes 0.06 0.00 - 0.07 K/uL  Type and screen [facility]     Status: None   Collection Time: 12/11/23 11:18 PM  Result Value Ref Range   ABO/RH(D) O POS    Antibody  Screen NEG    Sample Expiration      12/14/2023,2359 Performed at [facility], [address], [place], [place] [address]   CBC     Status: Abnormal   Collection Time: 12/12/23  5:00 AM  Result Value Ref Range   WBC 7.9 4.0 - 10.5 K/uL   RBC 3.48 (L) 4.22 - 5.81 MIL/uL   Hemoglobin 9.7 (L) 13.0 - 17.0 g/dL   HCT 29.5 (L) 62.1 - 30.8 %   MCV 89.1 80.0 - 100.0 fL   MCH 27.9 26.0 - 34.0 pg   MCHC 31.3 30.0 - 36.0 g/dL   RDW 65.7 (H) 84.6 - 96.2 %   Platelets 188 150 - 400 K/uL   nRBC 0.0 0.0 - 0.2 %  Basic metabolic panel     Status: Abnormal   Collection Time: 12/12/23  5:00 AM  Result Value Ref Range   Sodium 137 135 - 145 mmol/L   Potassium 3.9 3.5 - 5.1 mmol/L   Chloride 108 98 - 111 mmol/L   CO2 21 (L) 22 - 32 mmol/L   Glucose, Bld 101 (H) 70 - 99 mg/dL   BUN 21 8 - 23 mg/dL   Creatinine, Ser 9.52 0.61 - 1.24 mg/dL   Calcium 8.5 (L) 8.9 - 10.3 mg/dL   GFR, Estimated >84 >13 mL/min   Anion gap 8 5 - 15  Vitamin B12     Status: Abnormal   Collection Time: 12/12/23  5:00 AM  Result Value Ref Range   Vitamin B-12 1,366 (H) 180 - 914 pg/mL  Folate     Status: None   Collection Time: 12/12/23  5:00 AM  Result Value Ref Range   Folate 21.5 >5.9 ng/mL  Iron and TIBC     Status: Abnormal   Collection Time: 12/12/23  5:00 AM  Result Value Ref Range   Iron 48 45 - 182 ug/dL   TIBC 244 010 - 272 ug/dL   Saturation Ratios 14 (L) 17.9 - 39.5 %   UIBC 289 ug/dL  Ferritin     Status: Abnormal   Collection Time: 12/12/23  5:00 AM  Result Value Ref Range   Ferritin 17 (L) 24 - 336 ng/mL  Reticulocytes     Status: Abnormal   Collection Time: 12/12/23  5:00 AM  Result Value Ref Range   Retic Ct Pct 1.2 0.4 - 3.1 %   RBC. 3.46 (L) 4.22 - 5.81 MIL/uL   Retic Count, Absolute 42.6 19.0 - 186.0 K/uL   Immature Retic Fract 15.8 2.3 -  15.9 %     Imaging or Labs ordered: None  Medical history and chart was reviewed and case discussed with medical provider.  Assessment/Plan: 89 year old male with right intertrochanteric femur fracture.  Due to the unstable nature of his injury I recommend proceeding with cephalomedullary nailing of his right hip.  Risks and benefits were discussed with the patient.  Risks included but not limited to bleeding, infection, malunion, nonunion, hardware failure, hardware rotation, nerve and blood vessel injury, DVT, even the possible anesthetic complications.  He agreed to proceed with surgery and consent was obtained.  Roby Lofts, MD [facility] [phone] (office) [email or web address]

## 2023-12-12 NOTE — Plan of Care (Signed)

## 2023-12-12 NOTE — Consult Note (Signed)
 Orthopaedic Trauma Service (OTS) Consult   Patient ID: Oscar [name] MRN: [id number] DOB/AGE: 1931/03/26 89 y.o.  Reason for Consult:Right intertrochanteric femur fracture Referring Physician: Dr. Dione Booze, MD [facility]  HPI: Oscar [name] is an 89 y.o. male who is being seen in consultation at the request of Dr. Preston Fleeting for evaluation of right intertrochanteric femur fracture.  Patient sustained a ground-level fall and had a right intertrochanteric femur fracture.  Orthopedics consulted.  He was at [facility] due to the OR availability I asked that he be transferred over to [facility].  He was seen and evaluated in the preoperative holding area.  Denies any injuries other than his right hip.  He is an independent living facility.  He does not ambulate with any assist device.  Past Medical History:  Diagnosis Date   Abnormal nuclear stress test    Abnormal results of thyroid function studies 01/19/2014   Formatting of this note might be different from the original. August 2016  TSH 4.01   Apneic episode 11/29/2020   Ascending aorta dilatation (HCC) 12/07/2020   Atrial tachycardia, paroxysmal (HCC) 08/05/2019   Bunion of great toe of right foot 09/18/2016   Centrilobular emphysema (HCC) 11/26/2020   Cervical stenosis of spine    CHB (complete heart block) (HCC) 09/28/2020   Coronary artery disease involving native coronary artery of native heart without angina pectoris 08/05/2019   CVA (cerebral vascular accident) (HCC) 03/12/2020   Encounter for general adult medical examination without abnormal findings 05/20/2016   Essential hypertension 03/12/2020   Exertional dyspnea 01/18/2020   Gastro-esophageal reflux disease with esophagitis 09/28/2010   GERD (gastroesophageal reflux disease)    History of bladder cancer 03/14/2014   History of concussion    X2   NO RESIDUALS   History of peptic ulcer 1970'S   Hypercholesterolemia    Hypothyroidism due to Hashimoto's  thyroiditis 07/23/2019   LBBB (left bundle branch block)    Macular degeneration of left eye    Mixed hyperlipidemia 11/24/2013   Formatting of this note might be different from the original. History of TIA.  The goal is to have your total cholesterol < 200, the HDL (good cholesterol) >40, and the LDL (bad cholesterol) <100. It is recomended that you follow a good low fat diet and exercise for 30 minutes 3-4 times a week.   Personal history of tobacco use, presenting hazards to health 01/12/2014   Presence of permanent cardiac pacemaker 11/15/2020   S/P bilateral cataract extraction 09/18/2016   Serum reaction due to vaccination 09/27/2011   Formatting of this note might be different from the original. IMPRESSION: seen on Wednesday for reaction to right arm after pneumovax, had redness, warmth , tenderness and edema of right upper arm which is now resolved. monitor area. use warm compresses prn. call prn. discussed will not need another pneumovax, he will consider zostavax in future.   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   Status post placement of implantable loop recorder 11/08/2014   TIA (transient ischemic attack) 04/09/2014    Past Surgical History:  Procedure Laterality Date   ANTERIOR CERVICAL DECOMP/DISCECTOMY FUSION N/A 01/27/2018   Procedure: Anterior Cervical Discectomy Fusion - Cervical Four- Cervical Five;  Surgeon: Julio Sicks, MD;  Location: [facility] OR;  Service: Neurosurgery;  Laterality: N/A;  Anterior Cervical Discectomy Fusion - Cervical Four- Cervical Five   APPENDECTOMY  1970   CARDIOVASCULAR STRESS TEST  03/22/09   CYSTOSCOPY WITH BIOPSY N/A 02/10/2013  Procedure: CYSTOSCOPY WITH COLD CUP BIOPSY/FULGERATION;  Surgeon: Valetta Fuller, MD;  Location: [facility];  Service: Urology;  Laterality: N/A;  ALSO FULGERATION    INGUINAL HERNIA REPAIR Right 07-14-2002   LEFT HEART CATH AND CORONARY ANGIOGRAPHY N/A 07/13/2019   Procedure: LEFT HEART  CATH AND CORONARY ANGIOGRAPHY;  Surgeon: Lennette Bihari, MD;  Location: [facility] INVASIVE CV LAB;  Service: Cardiovascular;  Laterality: N/A;   LOOP RECORDER IMPLANT N/A 05/24/2014   Procedure: LOOP RECORDER IMPLANT;  Surgeon: Thurmon Fair, MD;  Location: [facility] CATH LAB;  Service: Cardiovascular;  Laterality: N/A;   LOOP RECORDER REMOVAL N/A 10/02/2020   Procedure: LOOP RECORDER REMOVAL;  Surgeon: Thurmon Fair, MD;  Location: [facility]AS[facility] CV [facility];  Service: Cardiovascular;  Laterality: N/A;   NECK SURGERY     PACEMAKER IMPLANT N/A 10/02/2020   Procedure: PACEMAKER IMPLANT;  Surgeon: Thurmon Fair, MD;  Location: [facility]AS[facility] CV [facility];  Service: Cardiovascular;  Laterality: N/A;   TONSILLECTOMY     TRANSURETHRAL RESECTION OF BLADDER TUMOR  03-10-2008   TRANSURETHRAL RESECTION OF BLADDER TUMOR Bilateral 12/28/2020   Procedure: TRANSURETHRAL RESECTION OF BLADDER TUMOR BLADDER BIOPSY FULGARATION BILATERAL RETROGRADE PYELOGRAM(TURBT);  Surgeon: Crist Fat, MD;  Location: [facility] ORS;  Service: Urology;  Laterality: Bilateral;   TUMOR EXCISION Left    Left arm    Family History  Problem Relation Age of Onset   Heart failure Mother    Stroke Father    Diabetes Brother    Heart failure Brother     Social History:  reports that he quit smoking about 44 years ago. His smoking use included cigarettes. He started smoking about 74 years ago. He has a 15 pack-year smoking history. He has never used smokeless tobacco. He reports current alcohol use. He reports that he does not use drugs.  Allergies: No Known Allergies  Medications:  No current facility-administered medications on file prior to encounter.   Current Outpatient Medications on File Prior to Encounter  Medication Sig Dispense Refill   acetaminophen (TYLENOL) 325 MG tablet Take 650 mg by mouth every 6 (six) hours as needed for mild pain (pain score 1-3) or moderate pain (pain score 4-6).     Cholecalciferol 125 MCG (5000 UT) TABS Take 1 tablet  by mouth daily.     dorzolamide-timolol (COSOPT) 2-0.5 % ophthalmic solution Place 1 drop into the left eye 2 (two) times daily.     famotidine (PEPCID) 20 MG tablet Take 20 mg by mouth daily.     ferrous sulfate 325 (65 FE) MG EC tablet Take 1 tablet by mouth 3 (three) times a week.     furosemide (LASIX) 20 MG tablet Take 1 tablet (20 mg total) by mouth daily. 90 tablet 2   pantoprazole (PROTONIX) 40 MG tablet Take 1 tablet (40 mg total) by mouth daily. 90 tablet 2   tamsulosin (FLOMAX) 0.4 MG CAPS capsule Take 1 capsule (0.4 mg total) by mouth daily. 90 capsule 2   VYZULTA 0.024 % SOLN Place 1 drop into the left eye at bedtime.     atorvastatin (LIPITOR) 40 MG tablet Take 1 tablet (40 mg total) by mouth daily. (Patient not taking: Reported on 12/12/2023) 90 tablet 3   carvedilol (COREG) 3.125 MG tablet Take 3.125 mg by mouth 2 (two) times daily with a meal. (Patient not taking: Reported on 12/12/2023)     sertraline (ZOLOFT) 50 MG tablet Take 50 mg by mouth daily. (Patient not taking: Reported on 12/12/2023)  Vibegron (GEMTESA) 75 MG TABS Take 1 tab by mouth daily. (Patient not taking: Reported on 12/12/2023) 90 tablet 2     ROS: Constitutional: No fever or chills Vision: No changes in vision ENT: No difficulty swallowing CV: No chest pain Pulm: No SOB or wheezing GI: No nausea or vomiting GU: No urgency or inability to hold urine Skin: No poor wound healing Neurologic: No numbness or tingling Psychiatric: No depression or anxiety Heme: No bruising Allergic: No reaction to medications or food   Exam: Blood pressure 132/85, pulse 76, temperature 97.8 F (36.6 C), temperature source Oral, resp. rate 18, height 5\' 8"  (1.727 m), weight 63.5 kg, SpO2 96%. General: No acute distress Orientation: Awake alert and oriented x 3 Mood and Affect: Cooperative and pleasant Gait: Unable to assess due to his fracture Coordination and balance: Within normal limits   Right lower extremity: Skin  is without lesions.  No deformity.  Leg is shortened and externally rotated slight amount.  He has soft and compressible compartments.  He has intact motor and sensory function distally.  He is warm well-perfused foot with 2+ DP pulses.  Left lower extremity skin without lesions. No tenderness to palpation. Full painless ROM, full strength in each muscle groups without evidence of instability.   Medical Decision Making: Data: Imaging: X-rays were reviewed which shows a right intertrochanteric femur fracture with mild displacement  Labs:  Results for orders placed or performed during the hospital encounter of 12/11/23 (from the past 24 hours)  Basic metabolic panel     Status: Abnormal   Collection Time: 12/11/23 11:18 PM  Result Value Ref Range   Sodium 137 135 - 145 mmol/L   Potassium 3.7 3.5 - 5.1 mmol/L   Chloride 107 98 - 111 mmol/L   CO2 20 (L) 22 - 32 mmol/L   Glucose, Bld 118 (H) 70 - 99 mg/dL   BUN 25 (H) 8 - 23 mg/dL   Creatinine, Ser 4.25 0.61 - 1.24 mg/dL   Calcium 8.4 (L) 8.9 - 10.3 mg/dL   GFR, Estimated >95 >63 mL/min   Anion gap 10 5 - 15  CBC with Differential     Status: Abnormal   Collection Time: 12/11/23 11:18 PM  Result Value Ref Range   WBC 8.1 4.0 - 10.5 K/uL   RBC 3.12 (L) 4.22 - 5.81 MIL/uL   Hemoglobin 8.6 (L) 13.0 - 17.0 g/dL   HCT 87.5 (L) 64.3 - 32.9 %   MCV 91.0 80.0 - 100.0 fL   MCH 27.6 26.0 - 34.0 pg   MCHC 30.3 30.0 - 36.0 g/dL   RDW 51.8 (H) 84.1 - 66.0 %   Platelets 156 150 - 400 K/uL   nRBC 0.0 0.0 - 0.2 %   Neutrophils Relative % 84 %   Neutro Abs 6.8 1.7 - 7.7 K/uL   Lymphocytes Relative 7 %   Lymphs Abs 0.6 (L) 0.7 - 4.0 K/uL   Monocytes Relative 7 %   Monocytes Absolute 0.6 0.1 - 1.0 K/uL   Eosinophils Relative 1 %   Eosinophils Absolute 0.1 0.0 - 0.5 K/uL   Basophils Relative 0 %   Basophils Absolute 0.0 0.0 - 0.1 K/uL   Immature Granulocytes 1 %   Abs Immature Granulocytes 0.06 0.00 - 0.07 K/uL  Type and screen [facility]     Status: None   Collection Time: 12/11/23 11:18 PM  Result Value Ref Range   ABO/RH(D) O POS    Antibody  Screen NEG    Sample Expiration      12/14/2023,2359 Performed at [facility], [address], [place], [place] [address]   CBC     Status: Abnormal   Collection Time: 12/12/23  5:00 AM  Result Value Ref Range   WBC 7.9 4.0 - 10.5 K/uL   RBC 3.48 (L) 4.22 - 5.81 MIL/uL   Hemoglobin 9.7 (L) 13.0 - 17.0 g/dL   HCT 29.5 (L) 62.1 - 30.8 %   MCV 89.1 80.0 - 100.0 fL   MCH 27.9 26.0 - 34.0 pg   MCHC 31.3 30.0 - 36.0 g/dL   RDW 65.7 (H) 84.6 - 96.2 %   Platelets 188 150 - 400 K/uL   nRBC 0.0 0.0 - 0.2 %  Basic metabolic panel     Status: Abnormal   Collection Time: 12/12/23  5:00 AM  Result Value Ref Range   Sodium 137 135 - 145 mmol/L   Potassium 3.9 3.5 - 5.1 mmol/L   Chloride 108 98 - 111 mmol/L   CO2 21 (L) 22 - 32 mmol/L   Glucose, Bld 101 (H) 70 - 99 mg/dL   BUN 21 8 - 23 mg/dL   Creatinine, Ser 9.52 0.61 - 1.24 mg/dL   Calcium 8.5 (L) 8.9 - 10.3 mg/dL   GFR, Estimated >84 >13 mL/min   Anion gap 8 5 - 15  Vitamin B12     Status: Abnormal   Collection Time: 12/12/23  5:00 AM  Result Value Ref Range   Vitamin B-12 1,366 (H) 180 - 914 pg/mL  Folate     Status: None   Collection Time: 12/12/23  5:00 AM  Result Value Ref Range   Folate 21.5 >5.9 ng/mL  Iron and TIBC     Status: Abnormal   Collection Time: 12/12/23  5:00 AM  Result Value Ref Range   Iron 48 45 - 182 ug/dL   TIBC 244 010 - 272 ug/dL   Saturation Ratios 14 (L) 17.9 - 39.5 %   UIBC 289 ug/dL  Ferritin     Status: Abnormal   Collection Time: 12/12/23  5:00 AM  Result Value Ref Range   Ferritin 17 (L) 24 - 336 ng/mL  Reticulocytes     Status: Abnormal   Collection Time: 12/12/23  5:00 AM  Result Value Ref Range   Retic Ct Pct 1.2 0.4 - 3.1 %   RBC. 3.46 (L) 4.22 - 5.81 MIL/uL   Retic Count, Absolute 42.6 19.0 - 186.0 K/uL   Immature Retic Fract 15.8 2.3 -  15.9 %     Imaging or Labs ordered: None  Medical history and chart was reviewed and case discussed with medical provider.  Assessment/Plan: 89 year old male with right intertrochanteric femur fracture.  Due to the unstable nature of his injury I recommend proceeding with cephalomedullary nailing of his right hip.  Risks and benefits were discussed with the patient.  Risks included but not limited to bleeding, infection, malunion, nonunion, hardware failure, hardware rotation, nerve and blood vessel injury, DVT, even the possible anesthetic complications.  He agreed to proceed with surgery and consent was obtained.  Roby Lofts, MD [facility] [phone] (office) [email or web address]

## 2023-12-12 NOTE — Op Note (Signed)
 Orthopaedic Surgery Operative Note (CSN: [id number] ) Date of Surgery: 12/12/2023  Admit Date: 12/11/2023   Diagnoses: Pre-Op Diagnoses: Right intertrochanteric femur fracture  Post-Op Diagnosis: Same  Procedures: CPT 27245-Cephalomedullary nailing of right intertrochanteric femur fracture  Surgeons : Primary: Roby Lofts, MD  Assistant: Thyra Breed, PA-C  Location: OR 3   Anesthesia: Spinal   Antibiotics: Ancef 2g preop   Tourniquet time: None    Estimated Blood Loss: 50 mL  Complications:* No complications entered in OR log *   Specimens:* No specimens in log *   Implants: Implant Name Type Inv. Item Serial No. Manufacturer Lot No. LRB No. Used Action  NAIL INTERTAN 10X18 130D 10S - [id number] Nail NAIL INTERTAN 10X18 130D 10S  [employer] [id number] Right 1 Implanted  SCREW LAG COMPR KIT 105/100 - [id number] Screw SCREW LAG COMPR KIT 105/100  [employer] [id number] Right 1 Implanted  SCREW TRIGEN LOW PROF 5.0X32.5 - [id number] Screw SCREW TRIGEN LOW PROF 5.0X32.5  [employer] [id number] Right 1 Implanted     Indications for Surgery: 89 year old male who sustained a ground-level fall with a right intertrochanteric femur fracture.  Due to the unstable nature of his injury I recommend proceeding with cephalomedullary nailing of his right hip.  Risks and benefits were discussed with the patient.  Risks include but not limited to bleeding, infection, malunion, nonunion, hardware failure, hardware rotation, nerve and blood vessel injury, DVT, even the possibility anesthetic complications.  He agreed to proceed with surgery and consent was obtained.  Operative Findings: Cephalomedullary nailing of right intertrochanteric femur fracture using [employer] InterTAN 10 x 180 mm nail with a 105 mm lag screw and a 100 mm compression screw.  Procedure: The patient was identified in the preoperative holding area. Consent was  confirmed with the patient and their family and all questions were answered. The operative extremity was marked after confirmation with the patient. he was then brought back to the operating room by our anesthesia colleagues.  He was placed under spinal anesthetic and carefully transferred over to a Hana table.  All bony prominences were well-padded.  Traction was applied to the right lower extremity.  Fluoroscopic imaging showed adequate alignment.  The right lower extremity was then prepped and draped in usual sterile fashion.  A timeout was performed to verify the patient, the procedure, and the extremity.  Preoperative antibiotics were dosed.  A small incision proximal to the greater trochanter was made and carried down through skin and subcutaneous tissue.  A threaded guidewire was placed at the tip of the greater trochanter and advanced into the proximal metaphysis.  An entry reamer was then used to enter the medullary canal.  I then passed a 10 x 180 mm nail down the center of the canal attached to the targeting arm.  I then directed a threaded guidewire up into the head/neck segment.  I confirmed adequate tip apex distance using fluoroscopy.  I then measured the length and chose to use 105 mm lag screw.  I then drilled the path for the compression screw and placed an antirotation bar.  I then drilled path for the lag screw and placed the lag screw.  I then compressed approximately 7 mm with a compression screw.  I then statically locked the proximal portion of the nail.  I then used the targeting arm to place a distal interlocking screw from lateral to medial.  The targeting arm was removed and final fluoroscopic imaging  was obtained.  The incision was copiously irrigated.  Closure of 3-0 Monocryl and Dermabond was used to close the skin.  Sterile dressings were applied.  The patient was then awoken from anesthesia and taken to the PACU in stable condition.   Post Op Plan/Instructions: Patient will be  weightbearing as tolerated to right lower extremity.  He will receive postoperative Ancef.  He received Lovenox for DVT prophylaxis and discharged on aspirin 81 mg.  Will have her mobilize with physical and Occupational Therapy.  I was present and performed the entire surgery.  Thyra Breed, PA-C did assist me throughout the case. An assistant was necessary given the difficulty in approach, maintenance of reduction and ability to instrument the fracture.   Truitt Merle, MD [facility]

## 2023-12-12 NOTE — Anesthesia Preprocedure Evaluation (Addendum)
 Anesthesia Evaluation  Patient identified by MRN, date of birth, ID band Patient awake    Reviewed: Allergy & Precautions, NPO status , Patient's Chart, lab work & pertinent test results  Airway Mallampati: II  TM Distance: >3 FB Neck ROM: Full    Dental no notable dental hx. (+) Upper Dentures, Dental Advisory Given   Pulmonary former smoker   Pulmonary exam normal breath sounds clear to auscultation       Cardiovascular hypertension, + CAD and +CHF (HFpEF)  Normal cardiovascular exam+ dysrhythmias (LBBB) + pacemaker (for CHB)  Rhythm:Regular Rate:Normal  02/2020 echo  Left ventricle: The cavity size is normal. Systolic function is at the lower limits of normal. The estimated ejection fraction is 50-55%. Wall motion is normal; there are no regional wall motion abnormalities. Grade I diastolic dysfunction.   Neuro/Psych    GI/Hepatic ,GERD  ,,  Endo/Other  Hypothyroidism    Renal/GU      Musculoskeletal   Abdominal   Peds  Hematology  (+) Blood dyscrasia, anemia Lab Results      Component                Value               Date                      WBC                      7.9                 12/12/2023                HGB                      9.7 (L)             12/12/2023                HCT                      31.0 (L)            12/12/2023                MCV                      89.1                12/12/2023                PLT                      188                 12/12/2023              Anesthesia Other Findings   Reproductive/Obstetrics                             Anesthesia Physical Anesthesia Plan  ASA: 3  Anesthesia Plan: Spinal   Post-op Pain Management:    Induction: Intravenous  PONV Risk Score and Plan: 1 and Treatment may vary due to age or medical condition, Ondansetron and Propofol infusion  Airway Management Planned: Natural Airway and Nasal  Cannula  Additional Equipment: None  Intra-op Plan:   Post-operative Plan:  Informed Consent: I have reviewed the patients History and Physical, chart, labs and discussed the procedure including the risks, benefits and alternatives for the proposed anesthesia with the patient or authorized representative who has indicated his/her understanding and acceptance.     Dental advisory given  Plan Discussed with: CRNA and Surgeon  Anesthesia Plan Comments:         Anesthesia Quick Evaluation

## 2023-12-12 NOTE — Discharge Instructions (Signed)
 Orthopaedic Trauma Service Discharge Instructions   General Discharge Instructions  WEIGHT BEARING STATUS:weightbearing as tolerated  RANGE OF MOTION/ACTIVITY: ok for unrestricted motion  Wound Care: You may remove your surgical dressing on post op day 2 (12/14/23). Incisions can be left open to air if there is no drainage. Once the incision is completely dry and without drainage, it may be left open to air out.  Showering may begin post op day 3 (12/15/23).  Clean incision gently with soap and water.  DVT/PE prophylaxis: Aspirin  Diet: as you were eating previously.  Can use over the counter stool softeners and bowel preparations, such as Miralax, to help with bowel movements.  Narcotics can be constipating.  Be sure to drink plenty of fluids  PAIN MEDICATION USE AND EXPECTATIONS  You have likely been given narcotic medications to help control your pain.  After a traumatic event that results in an fracture (broken bone) with or without surgery, it is ok to use narcotic pain medications to help control one's pain.  We understand that everyone responds to pain differently and each individual patient will be evaluated on a regular basis for the continued need for narcotic medications. Ideally, narcotic medication use should last no more than 6-8 weeks (coinciding with fracture healing).   As a patient it is your responsibility as well to monitor narcotic medication use and report the amount and frequency you use these medications when you come to your office visit.   We would also advise that if you are using narcotic medications, you should take a dose prior to therapy to maximize you participation.  IF YOU ARE ON NARCOTIC MEDICATIONS IT IS NOT PERMISSIBLE TO OPERATE A MOTOR VEHICLE (MOTORCYCLE/CAR/TRUCK/MOPED) OR HEAVY MACHINERY DO NOT MIX NARCOTICS WITH OTHER CNS (CENTRAL NERVOUS SYSTEM) DEPRESSANTS SUCH AS ALCOHOL   STOP SMOKING OR USING NICOTINE PRODUCTS!!!!  As discussed nicotine  severely impairs your body's ability to heal surgical and traumatic wounds but also impairs bone healing.  Wounds and bone heal by forming microscopic blood vessels (angiogenesis) and nicotine is a vasoconstrictor (essentially, shrinks blood vessels).  Therefore, if vasoconstriction occurs to these microscopic blood vessels they essentially disappear and are unable to deliver necessary nutrients to the healing tissue.  This is one modifiable factor that you can do to dramatically increase your chances of healing your injury.    (This means no smoking, no nicotine gum, patches, etc)  DO NOT USE NONSTEROIDAL ANTI-INFLAMMATORY DRUGS (NSAID'S)  Using products such as Advil (ibuprofen), Aleve (naproxen), Motrin (ibuprofen) for additional pain control during fracture healing can delay and/or prevent the healing response.  If you would like to take over the counter (OTC) medication, Tylenol (acetaminophen) is ok.  However, some narcotic medications that are given for pain control contain acetaminophen as well. Therefore, you should not exceed more than 4000 mg of tylenol in a day if you do not have liver disease.  Also note that there are may OTC medicines, such as cold medicines and allergy medicines that my contain tylenol as well.  If you have any questions about medications and/or interactions please ask your doctor/PA or your pharmacist.      ICE AND ELEVATE INJURED/OPERATIVE EXTREMITY  Using ice and elevating the injured extremity above your heart can help with swelling and pain control.  Icing in a pulsatile fashion, such as 20 minutes on and 20 minutes off, can be followed.    Do not place ice directly on skin. Make sure there is a  barrier between to skin and the ice pack.    Using frozen items such as frozen peas works well as the conform nicely to the are that needs to be iced.  USE AN ACE WRAP OR TED HOSE FOR SWELLING CONTROL  In addition to icing and elevation, Ace wraps or TED hose are used to  help limit and resolve swelling.  It is recommended to use Ace wraps or TED hose until you are informed to stop.    When using Ace Wraps start the wrapping distally (farthest away from the body) and wrap proximally (closer to the body)   Example: If you had surgery on your leg or thing and you do not have a splint on, start the ace wrap at the toes and work your way up to the thigh        If you had surgery on your upper extremity and do not have a splint on, start the ace wrap at your fingers and work your way up to the upper arm   CALL THE OFFICE WITH ANY QUESTIONS OR CONCERNS: [phone]   VISIT OUR WEBSITE FOR ADDITIONAL INFORMATION: [email or web address]    Discharge Wound Care Instructions  Do NOT apply any ointments, solutions or lotions to pin sites or surgical wounds.  These prevent needed drainage and even though solutions like hydrogen peroxide kill bacteria, they also damage cells lining the pin sites that help fight infection.  Applying lotions or ointments can keep the wounds moist and can cause them to breakdown and open up as well. This can increase the risk for infection. When in doubt call the office.  Surgical incisions should be dressed daily.  If any drainage is noted, use mepilex - These dressing supplies should be available at local medical supply stores [facility], [facility], etc) as well as Insurance claims handler ([facility], [facility], [employer], etc)  Once the incision is completely dry and without drainage, it may be left open to air out.  Showering may begin 36-48 hours later.  Cleaning gently with soap and water.

## 2023-12-12 NOTE — ED Notes (Signed)
 Assumed care of patient. Patient currently sleeping in bed with no signs of acute distress noted. Waiting on surgery/hospital bed to be ready for patient.

## 2023-12-12 NOTE — ED Notes (Signed)
 Patient asked nurse to call daughter, Arline Asp to inform her that her father was being taken to [facility] for surgery. Arline Asp stated she was on her way from [facility] and would go to the surgery waiting room at [facility] when she got there around 1030ish. Will notify OR RN at [facility] of patient's family being there. Cindy's phone number has been placed in chart as emergency contact.

## 2023-12-12 NOTE — H&P (Signed)
 History and Physical    Oscar [name]:[id number] DOB: 1931-01-21 DOA: 12/11/2023  PCP: Sharlene Dory, DO   Patient coming from: [facility]   Chief Complaint: Fall, right hip   HPI: Oscar [name] is a 89 y.o. male with medical history significant for atrial tachycardia, CHB with pacemaker, chronic HFpEF, and history of CVA who presents with severe right hip pain after a fall.  Patient reports that he was in his usual state of health when he was out walking his dog.  He slipped and fell onto his right hip.  He has been unable to bear weight and in severe pain since then.  He denies hitting his head or losing consciousness.  He is typically active able to ascend stairs and walk up inclines; he never experiences angina.  ED Course: Upon arrival to the ED, patient is found to be afebrile and saturating well on room air with normal heart rate and stable blood pressure.  EKG demonstrates chronic LBBB.  Chest x-ray is negative for acute cardiopulmonary disease.  Plain radiographs of the hip demonstrates displaced right intertrochanteric femur fracture.  Labs are most notable for elevated BUN, normal WBC, and hemoglobin 8.6.  Orthopedic surgery (Dr. Jena Gauss) was consulted by the ED physician and the patient was treated with morphine.  Review of Systems:  All other systems reviewed and apart from HPI, are negative.  Past Medical History:  Diagnosis Date   Abnormal nuclear stress test    Abnormal results of thyroid function studies 01/19/2014   Formatting of this note might be different from the original. August 2016  TSH 4.01   Apneic episode 11/29/2020   Ascending aorta dilatation (HCC) 12/07/2020   Atrial tachycardia, paroxysmal (HCC) 08/05/2019   Bunion of great toe of right foot 09/18/2016   Centrilobular emphysema (HCC) 11/26/2020   Cervical stenosis of spine    CHB (complete heart block) (HCC) 09/28/2020   Coronary artery disease involving native coronary artery of  native heart without angina pectoris 08/05/2019   CVA (cerebral vascular accident) (HCC) 03/12/2020   Encounter for general adult medical examination without abnormal findings 05/20/2016   Essential hypertension 03/12/2020   Exertional dyspnea 01/18/2020   Gastro-esophageal reflux disease with esophagitis 09/28/2010   GERD (gastroesophageal reflux disease)    History of bladder cancer 03/14/2014   History of concussion    X2   NO RESIDUALS   History of peptic ulcer 1970'S   Hypercholesterolemia    Hypothyroidism due to Hashimoto's thyroiditis 07/23/2019   LBBB (left bundle branch block)    Macular degeneration of left eye    Mixed hyperlipidemia 11/24/2013   Formatting of this note might be different from the original. History of TIA.  The goal is to have your total cholesterol < 200, the HDL (good cholesterol) >40, and the LDL (bad cholesterol) <100. It is recomended that you follow a good low fat diet and exercise for 30 minutes 3-4 times a week.   Personal history of tobacco use, presenting hazards to health 01/12/2014   Presence of permanent cardiac pacemaker 11/15/2020   S/P bilateral cataract extraction 09/18/2016   Serum reaction due to vaccination 09/27/2011   Formatting of this note might be different from the original. IMPRESSION: seen on Wednesday for reaction to right arm after pneumovax, had redness, warmth , tenderness and edema of right upper arm which is now resolved. monitor area. use warm compresses prn. call prn. discussed will not need another pneumovax, he will consider zostavax in  future.   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   Status post placement of implantable loop recorder 11/08/2014   TIA (transient ischemic attack) 04/09/2014    Past Surgical History:  Procedure Laterality Date   ANTERIOR CERVICAL DECOMP/DISCECTOMY FUSION N/A 01/27/2018   Procedure: Anterior Cervical Discectomy Fusion - Cervical Four- Cervical Five;  Surgeon: Julio Sicks, MD;  Location: [facility] OR;  Service: Neurosurgery;  Laterality: N/A;  Anterior Cervical Discectomy Fusion - Cervical Four- Cervical Five   APPENDECTOMY  1970   CARDIOVASCULAR STRESS TEST  03/22/09   CYSTOSCOPY WITH BIOPSY N/A 02/10/2013   Procedure: CYSTOSCOPY WITH COLD CUP BIOPSY/FULGERATION;  Surgeon: Valetta Fuller, MD;  Location: [facility];  Service: Urology;  Laterality: N/A;  ALSO FULGERATION    INGUINAL HERNIA REPAIR Right 07-14-2002   LEFT HEART CATH AND CORONARY ANGIOGRAPHY N/A 07/13/2019   Procedure: LEFT HEART CATH AND CORONARY ANGIOGRAPHY;  Surgeon: Lennette Bihari, MD;  Location: [facility] INVASIVE CV LAB;  Service: Cardiovascular;  Laterality: N/A;   LOOP RECORDER IMPLANT N/A 05/24/2014   Procedure: LOOP RECORDER IMPLANT;  Surgeon: Thurmon Fair, MD;  Location: [facility] CATH LAB;  Service: Cardiovascular;  Laterality: N/A;   LOOP RECORDER REMOVAL N/A 10/02/2020   Procedure: LOOP RECORDER REMOVAL;  Surgeon: Thurmon Fair, MD;  Location: [facility] INVASIVE CV LAB;  Service: Cardiovascular;  Laterality: N/A;   NECK SURGERY     PACEMAKER IMPLANT N/A 10/02/2020   Procedure: PACEMAKER IMPLANT;  Surgeon: Thurmon Fair, MD;  Location: [facility] INVASIVE CV LAB;  Service: Cardiovascular;  Laterality: N/A;   TONSILLECTOMY     TRANSURETHRAL RESECTION OF BLADDER TUMOR  03-10-2008   TRANSURETHRAL RESECTION OF BLADDER TUMOR Bilateral 12/28/2020   Procedure: TRANSURETHRAL RESECTION OF BLADDER TUMOR BLADDER BIOPSY FULGARATION BILATERAL RETROGRADE PYELOGRAM(TURBT);  Surgeon: Crist Fat, MD;  Location: [facility] ORS;  Service: Urology;  Laterality: Bilateral;   TUMOR EXCISION Left    Left arm    Social History:   reports that he quit smoking about 44 years ago. His smoking use included cigarettes. He started smoking about 74 years ago. He has a 15 pack-year smoking history. He has never used smokeless tobacco. He reports current alcohol use. He reports that he does not use drugs.  No Known  Allergies  Family History  Problem Relation Age of Onset   Heart failure Mother    Stroke Father    Diabetes Brother    Heart failure Brother      Prior to Admission medications   Medication Sig Start Date End Date Taking? Authorizing Provider  dorzolamide-timolol (COSOPT) 2-0.5 % ophthalmic solution Place 1 drop into the left eye 2 (two) times daily. 07/24/23  Yes [provider]  lidocaine (LIDODERM) 5 % Place 1 patch onto the skin See admin instructions. Place 1 patch on the skin daily. Remove & Discard patch within 12 hours or as directed by MD 07/04/23  Yes [provider]  VYZULTA 0.024 % SOLN Place 1 drop into the left eye at bedtime. 07/24/23  Yes [provider]  atorvastatin (LIPITOR) 40 MG tablet Take 1 tablet (40 mg total) by mouth daily. 11/27/20   Revankar, Aundra Dubin, MD  famotidine (PEPCID) 20 MG tablet Take 20 mg by mouth daily.    [provider]  ferrous sulfate 325 (65 FE) MG EC tablet Take 1 tablet by mouth 3 (three) times a week. 11/28/21   [provider]  furosemide (LASIX) 20 MG tablet Take 1 tablet (20 mg total)  by mouth daily. 11/19/23   Sharlene Dory, DO  pantoprazole (PROTONIX) 40 MG tablet Take 1 tablet (40 mg total) by mouth daily. 11/19/23   Sharlene Dory, DO  sertraline (ZOLOFT) 50 MG tablet Take 50 mg by mouth daily. 09/13/20   [provider]  tamsulosin (FLOMAX) 0.4 MG CAPS capsule Take 1 capsule (0.4 mg total) by mouth daily. 11/19/23   Wendling, Jilda Roche, DO  Vibegron (GEMTESA) 75 MG TABS Take 1 tab by mouth daily. 11/19/23   Sharlene Dory, DO    Physical Exam: Vitals:   12/11/23 2247 12/12/23 0100  BP: 130/72 (!) 141/90  Pulse: 70 72  Resp: 20 13  Temp: 98.2 F (36.8 C)   SpO2: 98% 100%    Constitutional: NAD, no pallor or diaphoresis   Eyes: PERTLA, lids and conjunctivae normal ENMT: Mucous membranes are moist. Posterior pharynx clear of any exudate or lesions.    Neck: supple, no masses  Respiratory: no wheezing, no crackles. No accessory muscle use.  Cardiovascular: S1 & S2 heard, regular rate and rhythm. No extremity edema.   Abdomen: No tenderness, soft. Bowel sounds active.  Musculoskeletal: no clubbing / cyanosis. Right hip tender, neurovascularly intact distally.   Skin: no significant rashes, lesions, ulcers. Warm, dry, well-perfused. Neurologic: CN 2-12 grossly intact. Moving all extremities. Alert and oriented.  Psychiatric: Calm. Cooperative.    Labs and Imaging on Admission: I have personally reviewed following labs and imaging studies  CBC: Recent Labs  Lab 12/11/23 2318  WBC 8.1  NEUTROABS 6.8  HGB 8.6*  HCT 28.4*  MCV 91.0  PLT 156   Basic Metabolic Panel: Recent Labs  Lab 12/11/23 2318  NA 137  K 3.7  CL 107  CO2 20*  GLUCOSE 118*  BUN 25*  CREATININE 0.94  CALCIUM 8.4*   GFR: CrCl cannot be calculated (Unknown ideal weight.). Liver Function Tests: No results for input(s): "AST", "ALT", "ALKPHOS", "BILITOT", "PROT", "ALBUMIN" in the last 168 hours. No results for input(s): "LIPASE", "AMYLASE" in the last 168 hours. No results for input(s): "AMMONIA" in the last 168 hours. Coagulation Profile: No results for input(s): "INR", "PROTIME" in the last 168 hours. Cardiac Enzymes: No results for input(s): "CKTOTAL", "CKMB", "CKMBINDEX", "TROPONINI" in the last 168 hours. BNP (last 3 results) No results for input(s): "PROBNP" in the last 8760 hours. HbA1C: No results for input(s): "HGBA1C" in the last 72 hours. CBG: No results for input(s): "GLUCAP" in the last 168 hours. Lipid Profile: No results for input(s): "CHOL", "HDL", "LDLCALC", "TRIG", "CHOLHDL", "LDLDIRECT" in the last 72 hours. Thyroid Function Tests: No results for input(s): "TSH", "T4TOTAL", "FREET4", "T3FREE", "THYROIDAB" in the last 72 hours. Anemia Panel: No results for input(s): "VITAMINB12", "FOLATE", "FERRITIN", "TIBC", "IRON", "RETICCTPCT"  in the last 72 hours. Urine analysis:    Component Value Date/Time   COLORURINE YELLOW 09/19/2021 1705   APPEARANCEUR CLEAR 09/19/2021 1705   LABSPEC >=1.030 09/19/2021 1705   PHURINE 5.5 09/19/2021 1705   GLUCOSEU NEGATIVE 09/19/2021 1705   HGBUR TRACE (A) 09/19/2021 1705   BILIRUBINUR NEGATIVE 09/19/2021 1705   KETONESUR NEGATIVE 09/19/2021 1705   PROTEINUR NEGATIVE 09/19/2021 1705   UROBILINOGEN 0.2 06/22/2007 0800   NITRITE NEGATIVE 09/19/2021 1705   LEUKOCYTESUR NEGATIVE 09/19/2021 1705   Sepsis Labs: @LABRCNTIP (procalcitonin:4,lacticidven:4) )No results found for this or any previous visit (from the past 240 hours).   Radiological Exams on Admission: DG Chest 1 View Result Date: 12/11/2023 CLINICAL DATA:  Status post fall with hip fracture.  EXAM: CHEST  1 VIEW COMPARISON:  Chest radiograph 10/08/2020 FINDINGS: Left-sided pacemaker in place.The cardiomediastinal contours are normal. Aortic atherosclerosis. The lungs are clear. Pulmonary vasculature is normal. No consolidation, pleural effusion, or pneumothorax. No acute osseous abnormalities are seen. IMPRESSION: No acute findings. Electronically Signed   By: Narda Rutherford M.D.   On: 12/11/2023 23:40   DG Hip Unilat With Pelvis 2-3 Views Right Result Date: 12/11/2023 CLINICAL DATA:  Pain after fall. EXAM: DG HIP (WITH OR WITHOUT PELVIS) 2-3V RIGHT COMPARISON:  None Available. FINDINGS: Displaced intertrochanteric right proximal femur fracture. Involvement of the greater trochanter and base of the lesser trochanter. Femoral head remains seated. Mild bilateral hip osteoarthritis. Intact pubic rami. Degenerative change of the sacroiliac joints. IMPRESSION: Displaced intertrochanteric right proximal femur fracture. Electronically Signed   By: Narda Rutherford M.D.   On: 12/11/2023 23:39    EKG: Independently reviewed. SR, chronic LBBB.   Assessment/Plan  1. Right hip fracture  - There is no new or worsening cardiac symptoms, no  PCI or MI in the last year; risk of major perioperative cardiac events >1%, but he has good functional capacity and no further preoperative cardiac evaluation is indicated  - Continue NPO, pain-control, supportive care   2. CAD  - Mild non-obstructive CAD on cath in October 2020  - No anginal symptoms  - Continue Lipitor    3. Chronic HFpEF  - Appears compensated  - Hold diuretics while NPO, monitor volume status   4. Anemia  - Hgb is 8.6, down from 11.8 in October 2024  - No overt bleeding  - Check anemia panel, repeat CBC in am     DVT prophylaxis: SCDs  Code Status: DNR, okay with Full Code perioperatively  Level of Care: Level of care: Med-Surg Family Communication: None present  Disposition Plan:  Patient is from: [place]  Anticipated d/c is to: TBD Anticipated d/c date is: 12/16/23  Patient currently: Pending orthopedic surgery consultation, likely operative hip repair  Consults called: Orthopedic surgery  Admission status: Inpatient     Briscoe Deutscher, MD [facility]  12/12/2023, 2:23 AM

## 2023-12-12 NOTE — Anesthesia Postprocedure Evaluation (Signed)
 Anesthesia Post Note  Patient: DEMONTEZ NOVACK  Procedure(s) Performed: FIXATION, FRACTURE, INTERTROCHANTERIC, WITH INTRAMEDULLARY ROD (Right)     Patient location during evaluation: Nursing Unit Anesthesia Type: Spinal Level of consciousness: oriented and awake and alert Pain management: pain level controlled Vital Signs Assessment: post-procedure vital signs reviewed and stable Respiratory status: spontaneous breathing and respiratory function stable Cardiovascular status: blood pressure returned to baseline and stable Postop Assessment: no headache, no backache, no apparent nausea or vomiting and patient able to bend at knees Anesthetic complications: no  No notable events documented.  Last Vitals:  Vitals:   12/12/23 1300 12/12/23 1320  BP: 117/67 106/69  Pulse: 71 76  Resp: (!) 21 16  Temp: 36.6 C 36.6 C  SpO2: 99% 97%    Last Pain:  Vitals:   12/12/23 1320  TempSrc: Oral  PainSc: 0-No pain                 Trevor Iha

## 2023-12-12 NOTE — Anesthesia Procedure Notes (Signed)
 Date/Time: 12/12/2023 10:18 AM  Performed by: Shary Decamp, CRNAPre-anesthesia Checklist: Patient identified, Emergency Drugs available, Suction available, Timeout performed and Patient being monitored Patient Re-evaluated:Patient Re-evaluated prior to induction Oxygen Delivery Method: Simple face mask

## 2023-12-12 NOTE — ED Notes (Signed)
 Called and spoke with the OR and Short Stay at [facility] for patient's surgery. Gave report to [place], Charity fundraiser. Verified with [facility] that patient was on the list to be transferred to [facility].

## 2023-12-12 NOTE — Progress Notes (Signed)
 [facility] PROGRESS NOTE  Patient: Oscar [name] WUJ:[id number]   PCP: Sharlene Dory, DO DOB: 07/15/1931   DOA: 12/11/2023   DOS: 12/12/2023    Subjective: Seen after the surgery.  No acute complaint.  No nausea no vomiting.  Objective:  Vitals:   12/12/23 1316 12/12/23 1320 12/12/23 1416 12/12/23 1556  BP:  106/69  (!) 150/75  Pulse:  76  79  Resp:  16  16  Temp:  97.8 F (36.6 C)  98.7 F (37.1 C)  TempSrc:  Oral  Oral  SpO2: 100% 97% 100% 100%  Weight:      Height:       Clear to auscultation. S1-S2 present.  Conversant present.  Assessment and plan: Right intertrochanteric femur fracture treated with cephalomedullary nailing. Monitor postop recovery. Transfuse for hemoglobin less than 8. Patient had provided consent for blood transfusion.  Author: Lynden Oxford, MD [facility] 12/12/2023 7:31 PM   If 7PM-7AM, please contact night-coverage at [email or web address]

## 2023-12-12 NOTE — Progress Notes (Signed)
  PROGRESS NOTE  Patient admitted earlier this morning. See H&P. He is from [facility] facility, fell and broke his right hip. He is to transfer to [facility] hospital for surgery this morning. He was briefly seen in the ED. No new issues reported. Await transfer and undergo surgery today. VSS. NPO.    Noralee Stain, DO [facility] 12/12/2023, 8:43 AM  Available via Epic secure chat 7am-7pm After these hours, please refer to coverage provider listed on [email or web address]

## 2023-12-13 DIAGNOSIS — S72001A Fracture of unspecified part of neck of right femur, initial encounter for closed fracture: Secondary | ICD-10-CM | POA: Diagnosis not present

## 2023-12-13 LAB — CBC
HCT: 30.4 % — ABNORMAL LOW (ref 39.0–52.0)
Hemoglobin: 9.7 g/dL — ABNORMAL LOW (ref 13.0–17.0)
MCH: 27.8 pg (ref 26.0–34.0)
MCHC: 31.9 g/dL (ref 30.0–36.0)
MCV: 87.1 fL (ref 80.0–100.0)
Platelets: 175 10*3/uL (ref 150–400)
RBC: 3.49 MIL/uL — ABNORMAL LOW (ref 4.22–5.81)
RDW: 16.1 % — ABNORMAL HIGH (ref 11.5–15.5)
WBC: 7.9 10*3/uL (ref 4.0–10.5)
nRBC: 0 % (ref 0.0–0.2)

## 2023-12-13 LAB — MAGNESIUM: Magnesium: 1.9 mg/dL (ref 1.7–2.4)

## 2023-12-13 MED ORDER — ACETAMINOPHEN 500 MG PO TABS
1000.0000 mg | ORAL_TABLET | Freq: Three times a day (TID) | ORAL | Status: DC
  Administered 2023-12-13 – 2023-12-16 (×10): 1000 mg via ORAL
  Filled 2023-12-13 (×12): qty 2

## 2023-12-13 MED ORDER — METHOCARBAMOL 500 MG PO TABS
500.0000 mg | ORAL_TABLET | Freq: Three times a day (TID) | ORAL | Status: DC
  Administered 2023-12-13 – 2023-12-15 (×6): 500 mg via ORAL
  Filled 2023-12-13 (×6): qty 1

## 2023-12-13 MED ORDER — BENZONATATE 100 MG PO CAPS
100.0000 mg | ORAL_CAPSULE | Freq: Three times a day (TID) | ORAL | Status: DC
  Administered 2023-12-13 – 2023-12-17 (×11): 100 mg via ORAL
  Filled 2023-12-13 (×12): qty 1

## 2023-12-13 MED ORDER — DM-GUAIFENESIN ER 30-600 MG PO TB12
1.0000 | ORAL_TABLET | Freq: Two times a day (BID) | ORAL | Status: DC
  Administered 2023-12-13 – 2023-12-17 (×8): 1 via ORAL
  Filled 2023-12-13 (×9): qty 1

## 2023-12-13 NOTE — Progress Notes (Addendum)
 Subjective: 1 Day Post-Op s/p Procedure(s): FIXATION, FRACTURE, INTERTROCHANTERIC, WITH INTRAMEDULLARY ROD   Patient is alert, oriented. Reports pain as moderate-severe with movement, minimal at rest. Ready to start activity as much as he can tolerate. Denies chest pain, SOB, Calf pain. No nausea/vomiting. No other complaints.  Objective:  PE: VITALS:   Vitals:   12/12/23 1556 12/12/23 2038 12/13/23 0038 12/13/23 0535  BP: (!) 150/75 (!) 154/75 131/72 (!) 154/73  Pulse: 79 76 76 72  Resp: 16 20 20 18   Temp: 98.7 F (37.1 C) 99.2 F (37.3 C) 99.1 F (37.3 C) 99.3 F (37.4 C)  TempSrc: Oral Oral Oral Oral  SpO2: 100% 100% 100% 100%  Weight:      Height:        ABD soft Neurovascular intact Sensation intact distally Intact pulses distally Dorsiflexion/Plantar flexion intact Incision: dressing C/D/I  LABS  Results for orders placed or performed during the hospital encounter of 12/11/23 (from the past 24 hours)  Type and screen [facility]     Status: None   Collection Time: 12/12/23 10:00 AM  Result Value Ref Range   ABO/RH(D) O POS    Antibody Screen NEG    Sample Expiration      12/15/2023,2359 Performed at [facility], [address], [place], [place] [address]   Magnesium     Status: None   Collection Time: 12/13/23  4:11 AM  Result Value Ref Range   Magnesium 1.9 1.7 - 2.4 mg/dL    DG FEMUR, MIN 2 VIEWS RIGHT Result Date: 12/12/2023 CLINICAL DATA:  Intertrochanteric fracture fixation with intramedullary rod. EXAM: RIGHT FEMUR 2 VIEWS COMPARISON:  Pelvis and right hip radiographs 12/11/2023 FINDINGS: Images were performed intraoperatively without the presence of a radiologist. Redemonstration of proximal femoral intertrochanteric fracture. The patient is undergoing cephalomedullary nail fixation of the proximal right femur. No hardware complication is seen. Total fluoroscopy images: 5 Total fluoroscopy time: 44 seconds Total dose:  Radiation Exposure Index (as provided by the fluoroscopic device): 5.27 mGy air Kerma Please see intraoperative findings for further detail. IMPRESSION: Intraoperative fluoroscopic guidance for cephalomedullary nail fixation of the proximal right femur. Electronically Signed   By: Neita Garnet M.D.   On: 12/12/2023 13:10   DG HIP PORT UNILAT W OR W/O PELVIS 1V RIGHT Result Date: 12/12/2023 CLINICAL DATA:  Postoperative right hip.  Fracture. EXAM: DG HIP (WITH OR WITHOUT PELVIS) 1V PORT RIGHT COMPARISON:  Pelvis and right hip radiographs 12/11/2023 FINDINGS: Interval cephalomedullary nail fixation of the previously seen intertrochanteric proximal femoral fracture. No perihardware lucency is seen to indicate hardware failure or loosening. There is improved, near anatomic alignment. Expected postoperative moderate lateral right hip soft tissue swelling and mild subcutaneous air. IMPRESSION: Interval cephalomedullary nail fixation of the previously seen intertrochanteric proximal femoral fracture. No evidence of hardware failure. Electronically Signed   By: Neita Garnet M.D.   On: 12/12/2023 13:08   DG C-Arm 1-60 Min-No Report Result Date: 12/12/2023 Fluoroscopy was utilized by the requesting physician.  No radiographic interpretation.   DG Chest 1 View Result Date: 12/11/2023 CLINICAL DATA:  Status post fall with hip fracture. EXAM: CHEST  1 VIEW COMPARISON:  Chest radiograph 10/08/2020 FINDINGS: Left-sided pacemaker in place.The cardiomediastinal contours are normal. Aortic atherosclerosis. The lungs are clear. Pulmonary vasculature is normal. No consolidation, pleural effusion, or pneumothorax. No acute osseous abnormalities are seen. IMPRESSION: No acute findings. Electronically Signed   By: Ivette Loyal.D.  On: 12/11/2023 23:40   DG Hip Unilat With Pelvis 2-3 Views Right Result Date: 12/11/2023 CLINICAL DATA:  Pain after fall. EXAM: DG HIP (WITH OR WITHOUT PELVIS) 2-3V RIGHT COMPARISON:  None  Available. FINDINGS: Displaced intertrochanteric right proximal femur fracture. Involvement of the greater trochanter and base of the lesser trochanter. Femoral head remains seated. Mild bilateral hip osteoarthritis. Intact pubic rami. Degenerative change of the sacroiliac joints. IMPRESSION: Displaced intertrochanteric right proximal femur fracture. Electronically Signed   By: Narda Rutherford M.D.   On: 12/11/2023 23:39    Assessment/Plan: Principal Problem:   Closed right hip fracture, initial encounter [facility]) Active Problems:   Coronary artery disease involving native coronary artery of native heart without angina pectoris   Normocytic anemia   Chronic heart failure with preserved ejection fraction (HFpEF) (HCC)   1 Day Post-Op s/p Procedure(s): FIXATION, FRACTURE, INTERTROCHANTERIC, WITH INTRAMEDULLARY ROD  CBC not back yet, will continue to monitor regarding ABLA  Weightbearing: WBAT RLE Insicional and dressing care: Reinforce dressings as needed VTE prophylaxis: lovenox while inpatient, will d/c home on aspirin 81 mg Pain control: continue current regimen, limit narcotics as much as possible Follow - up plan: with Dr. Clare Charon Dispo: pending PT/OT evals, likely to need SNF but patient would like to discharge back to home assisted living center  Contact information:   Janine Ores, PA-C Weekdays 8-5  After hours and holidays please check [email or web address] for group call information for [facility]  Armida Sans 12/13/2023, 6:50 AM

## 2023-12-13 NOTE — Hospital Course (Signed)
 PMH of HFpEF, CVA, PPM implant , hypothyroidism, HLD, presented to the hospital with complaints of mechanical fall. Was found to have right hip fracture. Underwent surgery on 3/21.  Assessment and Plan: Right intertrochanteric femur fracture. Orthopedic surgery was consulted. Fracture sustained after mechanical fall. Underwent cephalomedullary nailing on 3/21. Postoperatively so far doing okay. PT OT consulted.  Recommend SNF. Weightbearing, pain control per orthopedic.  CAD. HFpEF HTN Did well during the surgery. Monitor for now. Continue Lipitor. Will be on aspirin again.  Holding antihypertensive medications for now.  Chronic anemia. No active bleeding. H&H relatively stable postoperatively. Iron level normal.  Folate normal. B12 normal.  Reticulocyte normal. Transfuse hemoglobin less than 8.  HLD. Continue statin.  GERD. Continue PPI.  Mood disorder. Continue sertraline,  BPH. On Flomax and Gemtesa.  Continue follow-up as. {Problem list (Optional):1}

## 2023-12-13 NOTE — Evaluation (Signed)
 Physical Therapy Evaluation Patient Details Name: Oscar [name] MRN: [id number] DOB: 01/19/1931 Today's Date: 12/13/2023  History of Present Illness  Oscar [name] is a 89 y.o. male who presents with severe right hip pain after a fall. Plain radiographs of the hip demonstrates displaced right intertrochanteric femur fracture. Pt is now s/p IM nailing on 3/21. Past medical history significant for atrial tachycardia, CHB with pacemaker, chronic HFpEF, and history of CVA.  Clinical Impression  Pt presents with admitting diagnosis above. Pt today was very limited by pain requiring Mod A to stand with RW however was unable to take any steps. PTA pt reports he was fairly independent living at an ALF. Patient will benefit from continued inpatient follow up therapy, <3 hours/day. PT will continue to follow.         If plan is discharge home, recommend the following: Two people to help with walking and/or transfers;A lot of help with bathing/dressing/bathroom;Assistance with cooking/housework;Direct supervision/assist for medications management;Assist for transportation;Help with stairs or ramp for entrance   Can travel by private vehicle   No    Equipment Recommendations Rolling walker (2 wheels);Wheelchair (measurements PT);Wheelchair cushion (measurements PT);BSC/3in1;Hospital bed;Hoyer lift  Recommendations for Other Services       Functional Status Assessment Patient has had a recent decline in their functional status and demonstrates the ability to make significant improvements in function in a reasonable and predictable amount of time.     Precautions / Restrictions Precautions Precautions: Fall Recall of Precautions/Restrictions: Intact Restrictions Weight Bearing Restrictions Per Provider Order: Yes (NWB: R hip) RLE Weight Bearing Per Provider Order: Weight bearing as tolerated      Mobility  Bed Mobility Overal bed mobility: Needs Assistance Bed Mobility: Supine to Sit, Sit  to Supine     Supine to sit: Min assist Sit to supine: Mod assist   General bed mobility comments: Assistance with RLE management.    Transfers Overall transfer level: Needs assistance Equipment used: Rolling walker (2 wheels) Transfers: Sit to/from Stand Sit to Stand: Mod assist           General transfer comment: Cues for hand placement, Mod A to power up. Unable to steps or sidesteps.    Ambulation/Gait               General Gait Details: unable  Stairs            Wheelchair Mobility     Tilt Bed    Modified Rankin (Stroke Patients Only)       Balance Overall balance assessment: Needs assistance Sitting-balance support: Feet supported, Bilateral upper extremity supported Sitting balance-Leahy Scale: Poor     Standing balance support: Bilateral upper extremity supported, During functional activity Standing balance-Leahy Scale: Poor Standing balance comment: Reliant on RW                             Pertinent Vitals/Pain Pain Assessment Pain Assessment: 0-10 Pain Score: 5  Pain Location: R hip Pain Descriptors / Indicators: Aching, Discomfort, Grimacing Pain Intervention(s): Premedicated before session, Monitored during session, Limited activity within patient's tolerance    Home Living Family/patient expects to be discharged to:: Assisted living                 Home Equipment: Agricultural consultant (2 wheels);Shower seat - built in;Grab bars - toilet;Grab bars - tub/shower;Hand held shower head;Wheelchair - manual;Cane - single point      Prior Function Prior  Level of Function : Independent/Modified Independent             Mobility Comments: Ind no AD ADLs Comments: Ind     Extremity/Trunk Assessment   Upper Extremity Assessment Upper Extremity Assessment: Overall WFL for tasks assessed    Lower Extremity Assessment Lower Extremity Assessment: RLE deficits/detail RLE Deficits / Details: R hip fx    Cervical  / Trunk Assessment Cervical / Trunk Assessment: Kyphotic  Communication   Communication Communication: No apparent difficulties    Cognition Arousal: Alert Behavior During Therapy: WFL for tasks assessed/performed   PT - Cognitive impairments: No apparent impairments                         Following commands: Intact       Cueing Cueing Techniques: Verbal cues, Tactile cues     General Comments General comments (skin integrity, edema, etc.): BP: 121/63    Exercises     Assessment/Plan    PT Assessment Patient needs continued PT services  PT Problem List Decreased strength;Decreased range of motion;Decreased activity tolerance;Decreased balance;Decreased mobility;Decreased coordination;Decreased knowledge of use of DME;Decreased safety awareness;Decreased knowledge of precautions;Cardiopulmonary status limiting activity       PT Treatment Interventions DME instruction;Gait training;Stair training;Functional mobility training;Therapeutic activities;Therapeutic exercise;Balance training;Neuromuscular re-education;Patient/family education    PT Goals (Current goals can be found in the Care Plan section)  Acute Rehab PT Goals Patient Stated Goal: to get better PT Goal Formulation: With patient Time For Goal Achievement: 12/27/23 Potential to Achieve Goals: Fair    Frequency Min 3X/week     Co-evaluation               AM-PAC PT "6 Clicks" Mobility  Outcome Measure Help needed turning from your back to your side while in a flat bed without using bedrails?: A Little Help needed moving from lying on your back to sitting on the side of a flat bed without using bedrails?: A Little Help needed moving to and from a bed to a chair (including a wheelchair)?: Total Help needed standing up from a chair using your arms (e.g., wheelchair or bedside chair)?: A Lot Help needed to walk in hospital room?: Total Help needed climbing 3-5 steps with a railing? : Total 6  Click Score: 11    End of Session Equipment Utilized During Treatment: Gait belt Activity Tolerance: Patient limited by pain Patient left: in bed;with call bell/phone within reach;with family/visitor present;with bed alarm set Nurse Communication: Mobility status PT Visit Diagnosis: Other abnormalities of gait and mobility (R26.89)    Time: 1020-1048 PT Time Calculation (min) (ACUTE ONLY): 28 min   Charges:   PT Evaluation $PT Eval Moderate Complexity: 1 Mod PT Treatments $Therapeutic Activity: 8-22 mins PT General Charges $$ ACUTE PT VISIT: 1 Visit         Shela Nevin, PT, DPT Acute Rehab Services [phone]   Gladys Damme 12/13/2023, 3:40 PM

## 2023-12-13 NOTE — Plan of Care (Signed)
  Problem: Education: Goal: Knowledge of General Education information will improve Description: Including pain rating scale, medication(s)/side effects and non-pharmacologic comfort measures Outcome: Progressing   Problem: Health Behavior/Discharge Planning: Goal: Ability to manage health-related needs will improve Outcome: Progressing   Problem: Clinical Measurements: Goal: Will remain free from infection Outcome: Progressing   Problem: Clinical Measurements: Goal: Diagnostic test results will improve Outcome: Progressing   Problem: Clinical Measurements: Goal: Respiratory complications will improve Outcome: Progressing   Problem: Clinical Measurements: Goal: Cardiovascular complication will be avoided Outcome: Progressing   Problem: Elimination: Goal: Will not experience complications related to urinary retention Outcome: Progressing   Problem: Coping: Goal: Level of anxiety will decrease Outcome: Progressing

## 2023-12-13 NOTE — Progress Notes (Signed)
 [facility] Progress Note Patient: Oscar [name] ZOX:[id number] DOB: Oct 01, 1930 DOA: 12/11/2023  DOS: the patient was seen and examined on 12/13/2023  Brief Hospital Course: PMH of HFpEF, CVA, PPM implant , hypothyroidism, HLD, presented to the hospital with complaints of mechanical fall. Was found to have right hip fracture. Underwent surgery on 3/21.  Assessment and Plan: Right intertrochanteric femur fracture. Orthopedic surgery was consulted. Fracture sustained after mechanical fall. Underwent cephalomedullary nailing on 3/21. Postoperatively so far doing okay. PT OT consulted.  Recommend SNF. Weightbearing, pain control per orthopedic.  CAD. HFpEF HTN Did well during the surgery. Monitor for now. Continue Lipitor. Will be on aspirin again.  Holding antihypertensive medications for now.   Chronic anemia. No active bleeding. H&H relatively stable postoperatively. Iron level normal.  Folate normal. B12 normal.  Reticulocyte normal. Monitor. Transfuse hemoglobin less than 8.  HLD. Continue statin.  GERD. Continue PPI.  Mood disorder. Continue sertraline,  BPH. On Flomax and Gemtesa.  Continue follow-up as.     Subjective: No acute complaint.  Pain well-controlled.  No nausea no vomiting.  Physical Exam: General: in Mild distress, No Rash Cardiovascular: S1 and S2 Present, No Murmur Respiratory: Good respiratory effort, Bilateral Air entry present. No Crackles, No wheezes Abdomen: Bowel Sound present, No tenderness Extremities: No edema Neuro: Alert and oriented x3, no new focal deficit  Data Reviewed: I have Reviewed nursing notes, Vitals, and Lab results. Since last encounter, pertinent lab results CBC and BMP   . I have ordered test including CBC BMP  .   Disposition: Status is: Inpatient Remains inpatient appropriate because: Monitor for improvement in respiratory status  SCDs Start: 12/12/23 1316 SCDs Start: 12/12/23 0222   Family  Communication: Family at bedside Level of care: Med-Surg medically stable. Vitals:   12/13/23 0038 12/13/23 0535 12/13/23 0800 12/13/23 1543  BP: 131/72 (!) 154/73 (!) 154/74 (!) 112/42  Pulse: 76 72 73 75  Resp: 20 18 16 18   Temp: 99.1 F (37.3 C) 99.3 F (37.4 C) 98.1 F (36.7 C) 98.5 F (36.9 C)  TempSrc: Oral Oral Oral Oral  SpO2: 100% 100% 100% 97%  Weight:      Height:         Author: Lynden Oxford, MD 12/13/2023 5:45 PM  Please look on [email or web address] to find out who is on call.

## 2023-12-14 DIAGNOSIS — S72001A Fracture of unspecified part of neck of right femur, initial encounter for closed fracture: Secondary | ICD-10-CM | POA: Diagnosis not present

## 2023-12-14 LAB — CBC
HCT: 25.2 % — ABNORMAL LOW (ref 39.0–52.0)
Hemoglobin: 8.3 g/dL — ABNORMAL LOW (ref 13.0–17.0)
MCH: 28.2 pg (ref 26.0–34.0)
MCHC: 32.9 g/dL (ref 30.0–36.0)
MCV: 85.7 fL (ref 80.0–100.0)
Platelets: 163 10*3/uL (ref 150–400)
RBC: 2.94 MIL/uL — ABNORMAL LOW (ref 4.22–5.81)
RDW: 16.1 % — ABNORMAL HIGH (ref 11.5–15.5)
WBC: 7.1 10*3/uL (ref 4.0–10.5)
nRBC: 0 % (ref 0.0–0.2)

## 2023-12-14 LAB — BASIC METABOLIC PANEL WITH GFR
Anion gap: 6 (ref 5–15)
BUN: 15 mg/dL (ref 8–23)
CO2: 25 mmol/L (ref 22–32)
Calcium: 8.4 mg/dL — ABNORMAL LOW (ref 8.9–10.3)
Chloride: 104 mmol/L (ref 98–111)
Creatinine, Ser: 0.96 mg/dL (ref 0.61–1.24)
GFR, Estimated: >60 mL/min
Glucose, Bld: 124 mg/dL — ABNORMAL HIGH (ref 70–99)
Potassium: 3.9 mmol/L (ref 3.5–5.1)
Sodium: 135 mmol/L (ref 135–145)

## 2023-12-14 LAB — MAGNESIUM: Magnesium: 2 mg/dL (ref 1.7–2.4)

## 2023-12-14 MED ORDER — FAMOTIDINE 20 MG PO TABS
20.0000 mg | ORAL_TABLET | Freq: Every day | ORAL | Status: DC
  Administered 2023-12-14 – 2023-12-17 (×4): 20 mg via ORAL
  Filled 2023-12-14 (×4): qty 1

## 2023-12-14 MED ORDER — VITAMIN D 25 MCG (1000 UNIT) PO TABS
5000.0000 [IU] | ORAL_TABLET | Freq: Every day | ORAL | Status: DC
  Administered 2023-12-14 – 2023-12-17 (×4): 5000 [IU] via ORAL
  Filled 2023-12-14 (×5): qty 5

## 2023-12-14 MED ORDER — DORZOLAMIDE HCL-TIMOLOL MAL 2-0.5 % OP SOLN
1.0000 [drp] | Freq: Two times a day (BID) | OPHTHALMIC | Status: DC
  Administered 2023-12-15 – 2023-12-17 (×4): 1 [drp] via OPHTHALMIC
  Filled 2023-12-14: qty 10

## 2023-12-14 MED ORDER — FERROUS SULFATE 325 (65 FE) MG PO TABS
325.0000 mg | ORAL_TABLET | Freq: Every day | ORAL | Status: DC
  Administered 2023-12-14 – 2023-12-17 (×4): 325 mg via ORAL
  Filled 2023-12-14 (×5): qty 1

## 2023-12-14 MED ORDER — ADULT MULTIVITAMIN W/MINERALS CH
1.0000 | ORAL_TABLET | Freq: Every day | ORAL | Status: DC
  Administered 2023-12-14 – 2023-12-17 (×4): 1 via ORAL
  Filled 2023-12-14 (×4): qty 1

## 2023-12-14 MED ORDER — DORZOLAMIDE HCL-TIMOLOL MAL 2-0.5 % OP SOLN
1.0000 [drp] | Freq: Two times a day (BID) | OPHTHALMIC | Status: DC
  Filled 2023-12-14: qty 10

## 2023-12-14 MED ORDER — ENSURE ENLIVE PO LIQD
237.0000 mL | Freq: Two times a day (BID) | ORAL | Status: DC
  Administered 2023-12-14 – 2023-12-17 (×7): 237 mL via ORAL

## 2023-12-14 NOTE — Progress Notes (Signed)
 Initial Nutrition Assessment  DOCUMENTATION CODES:   Not applicable  INTERVENTION:  Multivitamin w/ minerals daily Ensure Enlive po BID, each supplement provides 350 kcal and 20 grams of protein. Liberalize diet to regular due to increased needs. Encourage good PO intake   NUTRITION DIAGNOSIS:   Increased nutrient needs related to hip fracture as evidenced by estimated needs.  GOAL:   Patient will meet greater than or equal to 90% of their needs  MONITOR:   PO intake, Labs, Weight trends, I & O's  REASON FOR ASSESSMENT:   Consult Hip fracture protocol  ASSESSMENT:   89 y.o. male presented to the ED with R hip pain after a fall. PMH includes CAD, CVA, HTN, HLD, bladder cancer, and GERD. Pt admitted with R hip fracture.   3/20 - Admitted  3/21 - Op, s/p R intertrochanteric w/ intramedullary rod  RD working remotely at time of assessment.  Discussed with RN, reports that he ate 25% of his breakfast this morning. RN provided pt with a strawberry Ensure and pt shared he will drink it. Discussed ordering Ensure BID with RN.   Unable to assess pt for weight loss due to no weight history within the past year.   Meal Intakes 3/12 - 0-50% x 2 meals  Medications reviewed and include: Vitamin D3, Colace, Pepcid, Ferrous Sulfate, Protonix Labs reviewed: Sodium 135, Potassium 3.9, BUN 15, Creatinine 0.96, Magnesium 2.0   NUTRITION - FOCUSED PHYSICAL EXAM:  Deferred to follow-up.   Diet Order:   Diet Order             Diet regular Room service appropriate? Yes with Assist; Fluid consistency: Thin  Diet effective now                   EDUCATION NEEDS:   No education needs have been identified at this time  Skin:  Skin Assessment: Reviewed RN Assessment  Last BM:  3/20  Height:   Ht Readings from Last 1 Encounters:  12/12/23 5\' 8"  (1.727 m)    Weight:   Wt Readings from Last 1 Encounters:  12/12/23 63.5 kg    Ideal Body Weight:  70 kg  BMI:  Body  mass index is 21.29 kg/m.  Estimated Nutritional Needs:  Kcal:  1700-1900 Protein:  85-105 grams Fluid:  >/= 1.7 L   Kirby Crigler RD, LDN Clinical Dietitian

## 2023-12-14 NOTE — Plan of Care (Signed)

## 2023-12-14 NOTE — Progress Notes (Signed)
    Subjective: Patient reports pain as moderate. Says he can't get his leg to move. Tolerating diet.  Urinating.   No CP, SOB.  Has not mobilized much OOB with PT yet d/t pain.  Objective:   VITALS:   Vitals:   12/13/23 1543 12/13/23 2103 12/14/23 0452 12/14/23 0742  BP: (!) 112/42 (!) 106/58 107/61 (!) 105/59  Pulse: 75 75 72 70  Resp: 18 19 19 16   Temp: 98.5 F (36.9 C) 98.9 F (37.2 C) 98.5 F (36.9 C) 98.6 F (37 C)  TempSrc: Oral Oral Oral Oral  SpO2: 97% 97% 97% 99%  Weight:      Height:          Latest Ref Rng & Units 12/14/2023    4:24 AM 12/13/2023   12:49 PM 12/12/2023    5:00 AM  CBC  WBC 4.0 - 10.5 K/uL 7.1  7.9  7.9   Hemoglobin 13.0 - 17.0 g/dL 8.3  9.7  9.7   Hematocrit 39.0 - 52.0 % 25.2  30.4  31.0   Platelets 150 - 400 K/uL 163  175  188       Latest Ref Rng & Units 12/14/2023    4:24 AM 12/12/2023    5:00 AM 12/11/2023   11:18 PM  BMP  Glucose 70 - 99 mg/dL 161  096  045   BUN 8 - 23 mg/dL 15  21  25    Creatinine 0.61 - 1.24 mg/dL 4.09  8.11  9.14   Sodium 135 - 145 mmol/L 135  137  137   Potassium 3.5 - 5.1 mmol/L 3.9  3.9  3.7   Chloride 98 - 111 mmol/L 104  108  107   CO2 22 - 32 mmol/L 25  21  20    Calcium 8.9 - 10.3 mg/dL 8.4  8.5  8.4    Intake/Output      03/22 0701 03/23 0700 03/23 0701 03/24 0700   P.O.  200   I.V. (mL/kg)     IV Piggyback     Total Intake(mL/kg)  200 (3.1)   Urine (mL/kg/hr) 650 (0.4)    Blood     Total Output 650    Net -650 +200           Physical Exam: General: NAD.  Sitting up in bed, calm, comfortable Resp: No increased wob Cardio: regular rate and rhythm ABD soft Neurologically intact MSK Neurovascularly intact Sensation intact distally Intact pulses distally Dorsiflexion/Plantar flexion intact Incision: dressing C/D/I   Assessment: 2 Days Post-Op  S/P Procedure(s) (LRB): FIXATION, FRACTURE, INTERTROCHANTERIC, WITH INTRAMEDULLARY ROD (Right) by Dr. Jena Gauss on 12/12/23  Principal  Problem:   Closed right hip fracture, initial encounter [facility]) Active Problems:   Coronary artery disease involving native coronary artery of native heart without angina pectoris   Normocytic anemia   Chronic heart failure with preserved ejection fraction (HFpEF) (HCC)    Plan:  Advance diet Up with therapy Incentive Spirometry Elevate and Apply ice  Weightbearing: WBAT RLE Insicional and dressing care: Reinforce dressings as needed Orthopedic device(s): None Showering: Keep dressing dry VTE prophylaxis:  ASA 325mg  daily  , SCDs, ambulation Pain control: PRN, limit narcotics as able due to age   Dispo:  PT recommending SNF.     Oscar Pane, PA-C Office [phone] 12/14/2023, 10:12 AM

## 2023-12-14 NOTE — Progress Notes (Signed)
 [facility] Progress Note Patient: Oscar [name] HYQ:[id number] DOB: 18-Mar-1931 DOA: 12/11/2023  DOS: the patient was seen and examined on 12/14/2023  Brief Hospital Course: PMH of HFpEF, CVA, PPM implant , hypothyroidism, HLD, presented to the hospital with complaints of mechanical fall. Was found to have right hip fracture. Underwent surgery on 3/21.  Assessment and Plan: Right intertrochanteric femur fracture. Orthopedic surgery was consulted. Fracture sustained after mechanical fall. Underwent cephalomedullary nailing on 3/21. Postoperatively so far doing okay. PT OT consulted.  Recommend SNF. Weightbearing, pain control per orthopedic.  CAD. HFpEF HTN Did well during the surgery. Monitor for now. Continue Lipitor. Will be on aspirin again.  Holding antihypertensive medications for now.  Chronic anemia. No active bleeding. H&H relatively stable postoperatively. Iron level normal.  Folate normal. B12 normal.  Reticulocyte normal. Transfuse hemoglobin less than 8.  HLD. Continue statin.  GERD. Continue PPI.  Mood disorder. Continue sertraline,  BPH. On Flomax and Gemtesa.  Continue follow-up as.     Subjective: No acute complaint.  Pain well-controlled.  Physical Exam: Clear to auscultation. S1-S2 present aortic systolic murmur. Bowel sound present No edema.  Data Reviewed: I have Reviewed nursing notes, Vitals, and Lab results. Since last encounter, pertinent lab results CBC and BMP   .   Disposition: Status is: Inpatient Remains inpatient appropriate because: Medically stable.  Awaiting placement.  SCDs Start: 12/12/23 1316 SCDs Start: 12/12/23 0222   Family Communication: Family at bedside. Level of care: Med-Surg   Vitals:   12/13/23 2103 12/14/23 0452 12/14/23 0742 12/14/23 1559  BP: (!) 106/58 107/61 (!) 105/59 107/68  Pulse: 75 72 70 75  Resp: 19 19 16 16   Temp: 98.9 F (37.2 C) 98.5 F (36.9 C) 98.6 F (37 C) 97.8 F (36.6 C)   TempSrc: Oral Oral Oral Oral  SpO2: 97% 97% 99% 95%  Weight:      Height:         Author: Lynden Oxford, MD 12/14/2023 7:45 PM  Please look on [email or web address] to find out who is on call.

## 2023-12-15 ENCOUNTER — Encounter (HOSPITAL_COMMUNITY): Payer: Self-pay | Admitting: Student

## 2023-12-15 DIAGNOSIS — S72001A Fracture of unspecified part of neck of right femur, initial encounter for closed fracture: Secondary | ICD-10-CM | POA: Diagnosis not present

## 2023-12-15 LAB — VITAMIN D 25 HYDROXY (VIT D DEFICIENCY, FRACTURES): Vit D, 25-Hydroxy: 44.56 ng/mL (ref 30–100)

## 2023-12-15 LAB — BASIC METABOLIC PANEL
Anion gap: 7 (ref 5–15)
BUN: 17 mg/dL (ref 8–23)
CO2: 23 mmol/L (ref 22–32)
Calcium: 8.5 mg/dL — ABNORMAL LOW (ref 8.9–10.3)
Chloride: 104 mmol/L (ref 98–111)
Creatinine, Ser: 0.9 mg/dL (ref 0.61–1.24)
GFR, Estimated: >60 mL/min (ref >60)
Glucose, Bld: 117 mg/dL — ABNORMAL HIGH (ref 70–99)
Potassium: 4.5 mmol/L (ref 3.5–5.1)
Sodium: 134 mmol/L — ABNORMAL LOW (ref 135–145)

## 2023-12-15 LAB — CBC
HCT: 28 % — ABNORMAL LOW (ref 39.0–52.0)
Hemoglobin: 8.8 g/dL — ABNORMAL LOW (ref 13.0–17.0)
MCH: 27.7 pg (ref 26.0–34.0)
MCHC: 31.4 g/dL (ref 30.0–36.0)
MCV: 88.1 fL (ref 80.0–100.0)
Platelets: 197 10*3/uL (ref 150–400)
RBC: 3.18 MIL/uL — ABNORMAL LOW (ref 4.22–5.81)
RDW: 15.9 % — ABNORMAL HIGH (ref 11.5–15.5)
WBC: 7.5 10*3/uL (ref 4.0–10.5)
nRBC: 0 % (ref 0.0–0.2)

## 2023-12-15 LAB — MAGNESIUM: Magnesium: 2 mg/dL (ref 1.7–2.4)

## 2023-12-15 MED ORDER — SODIUM CHLORIDE 0.9 % IV BOLUS
1000.0000 mL | Freq: Once | INTRAVENOUS | Status: AC
  Administered 2023-12-15: 1000 mL via INTRAVENOUS

## 2023-12-15 MED ORDER — METHOCARBAMOL 500 MG PO TABS: 500.0000 mg | ORAL_TABLET | Freq: Three times a day (TID) | ORAL | 0 refills | Status: DC

## 2023-12-15 MED ORDER — OXYCODONE HCL 5 MG PO TABS
5.0000 mg | ORAL_TABLET | ORAL | 0 refills | Status: DC | PRN
Start: 2023-12-15 — End: 2024-02-27

## 2023-12-15 MED ORDER — ASPIRIN 325 MG PO TBEC: 325.0000 mg | DELAYED_RELEASE_TABLET | Freq: Every day | ORAL | 0 refills | Status: DC

## 2023-12-15 NOTE — Care Management Important Message (Signed)
 Important Message  Patient Details  Name: Oscar [name] MRN: [id number] Date of Birth: 08/03/1931   Important Message Given:  Yes - Medicare IM     Dorena Bodo 12/15/2023, 2:27 PM

## 2023-12-15 NOTE — Plan of Care (Signed)

## 2023-12-15 NOTE — [place] FL2 (Signed)
 [place] MEDICAID FL2 LEVEL OF CARE FORM     IDENTIFICATION  Patient Name: Oscar [name] Birthdate: September 05, 1931 Sex: male Admission Date (Current Location): 12/11/2023  [facility] and [place] Number:  Producer, television/film/video and Address:  [facility], [address] [place], [place] [address]      Provider Number: [id number]  Attending Physician Name and Address:  Rolly Salter, MD  Relative Name and Phone Number:  Rhett Bannister; Daughter; [phone]    Current Level of Care: Hospital Recommended Level of Care: Skilled Nursing Facility Prior Approval Number:    Date Approved/Denied:   PASRR Number: [id number]  Discharge Plan: SNF    Current Diagnoses: Patient Active Problem List   Diagnosis Date Noted   Closed right hip fracture, initial encounter (HCC) 12/12/2023   Normocytic anemia 12/12/2023   Chronic heart failure with preserved ejection fraction (HFpEF) (HCC) 12/12/2023   OAB (overactive bladder) 11/19/2023   Benign prostatic hyperplasia with nocturia 11/19/2023   Hypercholesterolemia 10/09/2021   Bladder tumor 01/04/2021   Cervical stenosis of spine 01/04/2021   GERD (gastroesophageal reflux disease) 01/04/2021   LBBB (left bundle branch block) 01/04/2021   Ascending aorta dilatation (HCC) 12/07/2020   Apneic episode 11/29/2020   Centrilobular emphysema (HCC) 11/26/2020   Presence of permanent cardiac pacemaker 11/15/2020   Hyponatremia 11/13/2020   E. coli infection    History of appendectomy    History of concussion    History of peptic ulcer    Irregular heart beat    CHB (complete heart block) (HCC) 09/28/2020   Hospital discharge follow-up 03/28/2020   CVA (cerebral vascular accident) (HCC) 03/12/2020   Acute CVA (cerebrovascular accident) (HCC) 03/12/2020   Hypothyroidism (acquired) 03/12/2020   Essential hypertension 03/12/2020   Atrial tachycardia (HCC) 01/24/2020   Exertional dyspnea 01/18/2020   Atrial tachycardia,  paroxysmal (HCC) 08/05/2019   Coronary artery disease involving native coronary artery of native heart without angina pectoris 08/05/2019   Hypothyroidism due to Hashimoto's thyroiditis 07/23/2019   Abnormal nuclear stress test    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   Cervical myelopathy (HCC) 01/27/2018   Hyperlipidemia 11/20/2017   Bunion of great toe of right foot 09/18/2016   S/P bilateral cataract extraction 09/18/2016   Encounter for general adult medical examination without abnormal findings 05/20/2016   Status post placement of implantable loop recorder 11/08/2014   Near syncope 05/16/2014   TIA (transient ischemic attack) 04/09/2014   History of bladder cancer 03/14/2014   Abnormal results of thyroid function studies 01/19/2014   Personal history of tobacco use, presenting hazards to health 01/12/2014   Transient cerebral ischemia 01/12/2014   Mixed hyperlipidemia 11/24/2013   Adult body mass index 26.0-26.9 06/07/2013   Cancer of bladder (HCC) 02/10/2013   Serum reaction due to vaccination 09/27/2011   Gastro-esophageal reflux disease with esophagitis 09/28/2010    Orientation RESPIRATION BLADDER Height & Weight     Self, Time, Situation, Place  Normal (Room Air) Continent Weight: 140 lb (63.5 kg) Height:  5\' 8"  (172.7 cm)  BEHAVIORAL SYMPTOMS/MOOD NEUROLOGICAL BOWEL NUTRITION STATUS      Continent Diet (Please see dc summary)  AMBULATORY STATUS COMMUNICATION OF NEEDS Skin   Extensive Assist Verbally Other (Comment) (Incision (Closed) 12/12/23 Hip Right)                       Personal Care Assistance Level of Assistance  Bathing, Dressing, Feeding Bathing Assistance: Maximum assistance Feeding assistance:  Maximum assistance Dressing Assistance: Maximum assistance     Functional Limitations Info  Sight Sight Info: Impaired (R and L (Eyeglasses))        SPECIAL CARE FACTORS FREQUENCY  OT (By licensed OT), PT (By licensed PT)      PT Frequency: 5x OT Frequency: 5x            Contractures Contractures Info: Not present    Additional Factors Info  Code Status, Allergies Code Status Info: Full Code Allergies Info: NKA           Current Medications (12/15/2023):  This is the current hospital active medication list Current Facility-Administered Medications  Medication Dose Route Frequency Provider Last Rate Last Admin   acetaminophen (TYLENOL) tablet 1,000 mg  1,000 mg Oral Q8H Rolly Salter, MD   1,000 mg at 12/15/23 1322   acetaminophen (TYLENOL) tablet 650 mg  650 mg Oral Q6H PRN [place] Bali, PA-C       aspirin tablet 325 mg  325 mg Oral Daily [place] Bali, PA-C   325 mg at 12/15/23 1610   atorvastatin (LIPITOR) tablet 40 mg  40 mg Oral Daily [place] Bali, PA-C   40 mg at 12/15/23 9604   benzonatate (TESSALON) capsule 100 mg  100 mg Oral TID Rolly Salter, MD   100 mg at 12/15/23 5409   cholecalciferol (VITAMIN D3) 25 MCG (1000 UNIT) tablet 5,000 Units  5,000 Units Oral Daily Rolly Salter, MD   5,000 Units at 12/15/23 0851   dextromethorphan-guaiFENesin (MUCINEX DM) 30-600 MG per 12 hr tablet 1 tablet  1 tablet Oral BID Rolly Salter, MD   1 tablet at 12/15/23 8119   docusate sodium (COLACE) capsule 100 mg  100 mg Oral BID [place] Bali, PA-C   100 mg at 12/15/23 0852   dorzolamide-timolol (COSOPT) 2-0.5 % ophthalmic solution 1 drop  1 drop Right Eye BID Rolly Salter, MD   1 drop at 12/15/23 0853   famotidine (PEPCID) tablet 20 mg  20 mg Oral Daily Rolly Salter, MD   20 mg at 12/15/23 1478   feeding supplement (ENSURE ENLIVE / ENSURE PLUS) liquid 237 mL  237 mL Oral BID BM Rolly Salter, MD   237 mL at 12/15/23 1323   ferrous sulfate tablet 325 mg  325 mg Oral Q breakfast Rolly Salter, MD   325 mg at 12/15/23 2956   multivitamin with minerals tablet 1 tablet  1 tablet Oral Daily Rolly Salter, MD   1 tablet at 12/15/23 2130   oxyCODONE (Oxy IR/ROXICODONE) immediate  release tablet 5 mg  5 mg Oral Q4H PRN [place] Bali, PA-C   5 mg at 12/13/23 0519   pantoprazole (PROTONIX) EC tablet 40 mg  40 mg Oral Daily [place] Bali, PA-C   40 mg at 12/15/23 8657   polyethylene glycol (MIRALAX / GLYCOLAX) packet 17 g  17 g Oral Daily PRN [place] Bali, PA-C   17 g at 12/14/23 8469   prochlorperazine (COMPAZINE) injection 5 mg  5 mg Intravenous Q6H PRN [place], PA-C         Discharge Medications: Please see discharge summary for a list of discharge medications.  Relevant Imaging Results:  Relevant Lab Results:   Additional Information SS# [id number]  Marliss Coots, LCSW

## 2023-12-15 NOTE — TOC Progression Note (Addendum)
 Transition of Care [facility]) - Progression Note    Patient Details  Name: Oscar [name] MRN: [id number] Date of Birth: 1931-08-05  Transition of [facility]) CM/SW Contact  Marliss Coots, LCSW Phone Number: 12/15/2023, 10:04 AM  Clinical Narrative:     10:04 AM CSW introduced self and role to patient at bedside. CSW informed patient of therapy recommendation of discharge to SNF. Patient expressed preference in returning to [place] [facility] with Home Health vs discharging to SNF. Patient confirmed he is from [place] [facility]. Patient declined SDOH resources (housing). CSW relayed preference to medical team.  10:08 AM Case Manager Sue Lush [phone]) from [place] called CSW and informed that patient is from ILF and not ALF. Case Manager also expressed concerns regarding patient returning to ILF due to forgetfulness and level of assistance (ILF may need to hrie a caregiver if to return). Case Manager confirmed patient could receive therapy at ILF approximately five times a week but that is not guaranteed and are unsure how length of sessions. CSW informed ILF that patient is fully oriented at this time. CSW relayed concerns to medical team.  1:42 PM Per hospitalist, patient is now agreeable to discharge to SNF.  Expected Discharge Plan: Home w Home Health Services (return to ALF) Barriers to Discharge: Continued Medical Work up  Expected Discharge Plan and Services In-house Referral: Clinical Social Work Discharge Planning Services: CM Consult   Living arrangements for the past 2 months: Assisted Living Facility                                       Social Determinants of Health (SDOH) Interventions SDOH Screenings   Food Insecurity: No Food Insecurity (12/12/2023)  Housing: High Risk (12/12/2023)  Transportation Needs: No Transportation Needs (12/12/2023)  Utilities: Not At Risk (12/12/2023)  Depression (PHQ2-9): Low Risk  (11/19/2023)  Social Connections:  Moderately Integrated (12/12/2023)  Tobacco Use: Medium Risk (12/12/2023)    Readmission Risk Interventions     No data to display

## 2023-12-15 NOTE — Progress Notes (Signed)
 Orthopaedic Trauma Progress Note  SUBJECTIVE: Doing well this morning.  Pain in right hip manageable at rest.  Increases with motion.  Had difficulty mobilizing with therapies over the weekend secondary to pain.  Denies any significant numbness or tingling throughout the right lower extremity.  No chest pain. No SOB. No nausea/vomiting. No other complaints.  Tolerating diet and fluids.  No BM since admission.  Patient notes that is not abnormal for him to go several days without a bowel movement at baseline.  Denies any abdominal pain.  Prior to admission, patient residing in assisted living facility.  His goal is to return to ALF with home health therapies at discharge.  Would like to avoid SNF  OBJECTIVE:  Vitals:   12/15/23 0548 12/15/23 0729  BP: 126/65 118/65  Pulse: 73 69  Resp: 20 16  Temp: 98.7 F (37.1 C) 98.5 F (36.9 C)  SpO2: 100% 100%    General: Sitting up in bed, no acute distress.  Pleasant and cooperative Respiratory: No increased work of breathing.  Right lower extremity: Dressings removed, incisions clean, dry, intact.  Tenderness over the hip as expected.  Tolerates gentle knee range of motion.  Ankle DF/PF intact.  No significant calf tenderness.  Endorses sensation light touch of all aspects of the foot.  Neurovascularly intact.  IMAGING: Stable post op imaging.   LABS:  Results for orders placed or performed during the hospital encounter of 12/11/23 (from the past 24 hours)  Basic metabolic panel     Status: Abnormal   Collection Time: 12/15/23 10:14 AM  Result Value Ref Range   Sodium 134 (L) 135 - 145 mmol/L   Potassium 4.5 3.5 - 5.1 mmol/L   Chloride 104 98 - 111 mmol/L   CO2 23 22 - 32 mmol/L   Glucose, Bld 117 (H) 70 - 99 mg/dL   BUN 17 8 - 23 mg/dL   Creatinine, Ser 1.32 0.61 - 1.24 mg/dL   Calcium 8.5 (L) 8.9 - 10.3 mg/dL   GFR, Estimated >44 >01 mL/min   Anion gap 7 5 - 15  CBC     Status: Abnormal   Collection Time: 12/15/23 10:14 AM  Result  Value Ref Range   WBC 7.5 4.0 - 10.5 K/uL   RBC 3.18 (L) 4.22 - 5.81 MIL/uL   Hemoglobin 8.8 (L) 13.0 - 17.0 g/dL   HCT 02.7 (L) 25.3 - 66.4 %   MCV 88.1 80.0 - 100.0 fL   MCH 27.7 26.0 - 34.0 pg   MCHC 31.4 30.0 - 36.0 g/dL   RDW 40.3 (H) 47.4 - 25.9 %   Platelets 197 150 - 400 K/uL   nRBC 0.0 0.0 - 0.2 %  Magnesium     Status: None   Collection Time: 12/15/23 10:14 AM  Result Value Ref Range   Magnesium 2.0 1.7 - 2.4 mg/dL    ASSESSMENT: Oscar [name] is a 89 y.o. male, 3 Days Post-Op s/p intramedullary nailing right intertrochanteric femur fracture  CV/Blood loss: Acute blood loss anemia, Hgb 8.8 this morning. Hemodynamically stable  PLAN: Weightbearing: WBAT RLE ROM: Okay for unrestricted ROM Incisional and dressing care: Okay to leave incisions open to air.  Change Mepilex dressings as needed Showering: Okay to begin getting incisions wet in the shower Orthopedic device(s): None  Pain management:  1. Tylenol 1000 mg q 8 hours scheduled 2. Robaxin 500 mg q 6 hours PRN 3. Oxycodone 5 mg q 4 hours PRN 4. Dilaudid 1 mg q 3  hours PRN VTE prophylaxis: Aspirin, SCDs ID:  Ancef 2gm post op completed Foley/Lines:  No foley, KVO IVFs Impediments to Fracture Healing: Vitamin D level 41 when checked in 2022.  On home dose supplementation.  Recheck level today.   Dispo: PT/OT evaluation ongoing, recommending SNF.  Patient would prefer to return to his ALF at discharge. Okay for discharge from ortho standpoint once cleared by medicine team and therapies  D/C recommendations: -Oxycodone and Robaxin for pain control -Aspirin 325 mg daily x 30 days for DVT prophylaxis -Likely continue home dose Vit D supplementation  Follow - up plan: 2 weeks after discharge for wound check and repeat x-rays to   Contact information:  Truitt Merle MD, Thyra Breed PA-C. After hours and holidays please check [email or web address] for group call information for [facility]   Thompson Caul,  PA-C [phone] (office) [email or web address]

## 2023-12-15 NOTE — Progress Notes (Signed)
 [facility] Progress Note Patient: Oscar GUGLIELMO UXL:[id number] DOB: 03/27/1931 DOA: 12/11/2023  DOS: the patient was seen and examined on 12/15/2023  Brief Hospital Course: PMH of HFpEF, CVA, PPM implant , hypothyroidism, HLD, presented to the hospital with complaints of mechanical fall. Was found to have right hip fracture. Underwent surgery on 3/21.  Assessment and Plan: Right intertrochanteric femur fracture. Orthopedic surgery was consulted. Fracture sustained after mechanical fall. Underwent cephalomedullary nailing on 3/21. Postoperatively so far doing okay. PT OT consulted.  Recommend SNF. Weightbearing, pain control per orthopedic.  CAD. HFpEF HTN Did well during the surgery. Continue aspirin and Lipitor. Blood pressure medication on hold due to orthostasis.  Orthostatic hypotension. Likely from poor p.o. intake as well as pain medications. Blood pressure dropped significantly to 70 systolic. Improved with IV fluid. Discontinue scheduled Robaxin and Flomax as well.  Chronic anemia. No active bleeding. H&H relatively stable postoperatively. Iron level normal.  Folate normal. B12 normal.  Reticulocyte normal. Transfuse hemoglobin less than 8.  HLD. Continue statin.  GERD. Continue PPI.  Mood disorder. Continue sertraline,  BPH. On Flomax. Currently on hold.   Subjective: No acute complaint.  No nausea no vomiting.  While working with OT had a dizziness episode.   Physical Exam: General: in Mild distress, No Rash Cardiovascular: S1 and S2 Present, No Murmur Respiratory: Good respiratory effort, Bilateral Air entry present. No Crackles, No wheezes Abdomen: Bowel Sound present, No tenderness Extremities: No edema Neuro: Alert and oriented x3, no new focal deficit  Data Reviewed: I have Reviewed nursing notes, Vitals, and Lab results. Since last encounter, pertinent lab results CBG   .  Disposition: Status is: Inpatient Remains inpatient  appropriate because: Awaiting placement.  Medically stable.  SCDs Start: 12/12/23 1316 SCDs Start: 12/12/23 0222   Family Communication: Family at bedside Level of care: Med-Surg   Vitals:   12/15/23 1439 12/15/23 1441 12/15/23 1444 12/15/23 1606  BP: 100/89 97/77 100/64 127/71  Pulse:    68  Resp:    16  Temp:    98.6 F (37 C)  TempSrc:    Oral  SpO2:    99%  Weight:      Height:         Author: Lynden Oxford, MD 12/15/2023 5:51 PM  Please look on [email or web address] to find out who is on call.

## 2023-12-15 NOTE — Evaluation (Signed)
 Occupational Therapy Evaluation Patient Details Name: Oscar [name] MRN: [id number] DOB: [birth year] Today's Date: 12/15/2023   History of Present Illness   Oscar [name] is a 89 y.o. male who presents with severe right hip pain after a fall. Pt with displaced right intertrochanteric femur fracture s/p IM nailing on 3/21. Past medical history significant for atrial tachycardia, CHB with pacemaker, chronic HFpEF, and history of CVA.     Clinical Impressions Pt admitted with the above diagnosis and has the deficits listed below. Pt would benefit from further OT to increase independence with basic adls back to baseline which is mod I in an independent living facility where he cares for wife with dementia.  Pt currently requires mod A to stand and is unable to bear enough weight on RLE to advance LLE so is not ambulatory. Pt became dizzy upon standing today and had to quickly be returned to bed with max assist and was clammy and slow to respond. Once in supine, pt appeared to quickly recover and BP was 103/68.  Feel pt may have become orthostatic when standing. Nursing in room at end of session and pt was stable.  Wife also visiting. Daughter dropped wife off who has significant cognitive deficits of her own. Nursing aware she is visiting and in room with pt and daughter has left and will pick her up later.  Feel pt will need 24/7 mod assist at minimum in home to care for pt at current level.  If pt improves with mobility over next few day, pt would still need 24/7 assist in the home.  For this reason, rec less than three hours of therapy a day to help pt return to baseline.  If 24/7 mod assist could be found, pt may be able to return home with hired help when BP is stable.     If plan is discharge home, recommend the following:   A lot of help with walking and/or transfers;A lot of help with bathing/dressing/bathroom;Assistance with cooking/housework;Direct supervision/assist for medications  management;Direct supervision/assist for financial management;Assist for transportation;Supervision due to cognitive status     Functional Status Assessment   Patient has had a recent decline in their functional status and demonstrates the ability to make significant improvements in function in a reasonable and predictable amount of time.     Equipment Recommendations   None recommended by OT     Recommendations for Other Services         Precautions/Restrictions   Precautions Precautions: Fall;Other (comment) (watch BP) Recall of Precautions/Restrictions: Impaired Precaution/Restrictions Comments: BP dropped when standing Restrictions Weight Bearing Restrictions Per Provider Order: Yes RLE Weight Bearing Per Provider Order: Weight bearing as tolerated     Mobility Bed Mobility Overal bed mobility: Needs Assistance Bed Mobility: Supine to Sit, Sit to Supine     Supine to sit: Mod assist Sit to supine: Max assist   General bed mobility comments: Assist to get to full sit and to move R leg off bed and scoot to EOB.  Pt became very dizzing at end of session and therapist put pt in supine with max assist due to pt not feeling well.    Transfers Overall transfer level: Needs assistance Equipment used: Rolling walker (2 wheels) Transfers: Sit to/from Stand Sit to Stand: Mod assist           General transfer comment: Cues for hand placement, Mod A to power up. Took one side step when cued to shift weight onto R side to  be able to move LLE.      Balance Overall balance assessment: Needs assistance Sitting-balance support: Feet supported, Bilateral upper extremity supported Sitting balance-Leahy Scale: Fair Sitting balance - Comments: became dizzy in sitting therefore supervision provided   Standing balance support: Bilateral upper extremity supported, During functional activity Standing balance-Leahy Scale: Poor Standing balance comment: Reliant on RW                            ADL either performed or assessed with clinical judgement   ADL Overall ADL's : Needs assistance/impaired Eating/Feeding: Set up;Sitting   Grooming: Set up;Sitting   Upper Body Bathing: Set up;Sitting   Lower Body Bathing: Maximal assistance;Sit to/from stand;Cueing for compensatory techniques   Upper Body Dressing : Set up;Sitting   Lower Body Dressing: Maximal assistance;Sit to/from stand;Cueing for compensatory techniques   Toilet Transfer: Moderate assistance;Stand-pivot;BSC/3in1;Rolling walker (2 wheels) Toilet Transfer Details (indicate cue type and reason): assist to bear weight onto RLE to be able to advance LLE Toileting- Clothing Manipulation and Hygiene: Maximal assistance;Sit to/from stand;Cueing for compensatory techniques       Functional mobility during ADLs: Moderate assistance;+2 for physical assistance;Rolling walker (2 wheels) General ADL Comments: Pt most limited by pain in RLE and inablity to bear weight to be able to advance LLE. Pt became very dizzy when standing. Was returned directly to supine with BP of 103/68 in supine.  Feel pt was most likely orthostatic when he stood bc he did become dizzy reqesting to sit.     Vision Baseline Vision/History: 1 Wears glasses Ability to See in Adequate Light: 0 Adequate Patient Visual Report: No change from baseline Vision Assessment?: No apparent visual deficits     Perception Perception: Within Functional Limits       Praxis Praxis: WFL       Pertinent Vitals/Pain Pain Assessment Pain Assessment: Faces Faces Pain Scale: Hurts little more Pain Location: R hip Pain Descriptors / Indicators: Aching, Discomfort, Grimacing Pain Intervention(s): Limited activity within patient's tolerance, Monitored during session, Premedicated before session, Repositioned, Ice applied     Extremity/Trunk Assessment Upper Extremity Assessment Upper Extremity Assessment: Overall WFL for tasks  assessed   Lower Extremity Assessment Lower Extremity Assessment: Defer to PT evaluation   Cervical / Trunk Assessment Cervical / Trunk Assessment: Kyphotic   Communication Communication Communication: No apparent difficulties   Cognition Arousal: Alert Behavior During Therapy: WFL for tasks assessed/performed Cognition: Cognition impaired     Awareness: Intellectual awareness impaired, Online awareness impaired Memory impairment (select all impairments): Short-term memory, Working memory Attention impairment (select first level of impairment): Sustained attention Executive functioning impairment (select all impairments): Problem solving OT - Cognition Comments: Pt with some mild cogntive deficits noted. Pt with little awarness to deficits thining he can go home as he is when he currently requires a lot of assist to move and get up.  STM also impaired as well as safety and judgement.                 Following commands: Intact       Cueing  General Comments   Cueing Techniques: Verbal cues;Tactile cues  Pt limited by dizziness today.  One person only in room so could not get vitals in standing when pt became dizzy.  When in supine BP 103/68.   Exercises     Shoulder Instructions      Home Living Family/patient expects to be discharged to:: Private residence Living  Arrangements: Spouse/significant other;Other (Comment) (in independent living) Available Help at Discharge: Available PRN/intermittently Type of Home: Independent living facility Home Access: Level entry     Home Layout: One level     Bathroom Shower/Tub: Producer, television/film/video: Handicapped height Bathroom Accessibility: Yes How Accessible: Accessible via walker Home Equipment: Rolling Walker (2 wheels);Shower seat - built in;Grab bars - toilet;Grab bars - tub/shower;Hand held shower head;Wheelchair - manual;Cane - single point   Additional Comments: Pt lives independent living and  helps care for his wife with dementia. Pt has dog that gets under his feet.  All meals are in a dining room. Pt has someone clean apartement 1x week.      Prior Functioning/Environment Prior Level of Function : Independent/Modified Independent             Mobility Comments: Ind no AD ADLs Comments: doesnt cook. Meals in dining room..    OT Problem List: Decreased activity tolerance;Impaired balance (sitting and/or standing);Decreased cognition;Decreased safety awareness;Decreased knowledge of precautions;Decreased knowledge of use of DME or AE;Pain   OT Treatment/Interventions: Self-care/ADL training;Therapeutic activities;DME and/or AE instruction;Balance training      OT Goals(Current goals can be found in the care plan section)   Acute Rehab OT Goals Patient Stated Goal: to go home and be with my wife. OT Goal Formulation: With patient Time For Goal Achievement: 12/29/23 Potential to Achieve Goals: Good ADL Goals Pt Will Perform Lower Body Bathing: with min assist;sit to/from stand Pt Will Perform Lower Body Dressing: with min assist;sit to/from stand Pt Will Transfer to Toilet: ambulating;with min assist Pt Will Perform Toileting - Clothing Manipulation and hygiene: with min assist;sit to/from stand Pt Will Perform Tub/Shower Transfer: Shower transfer;with min assist;ambulating;shower seat;rolling walker;grab bars   OT Frequency:  Min 2X/week    Co-evaluation              AM-PAC OT "6 Clicks" Daily Activity     Outcome Measure Help from another person eating meals?: A Little Help from another person taking care of personal grooming?: A Little Help from another person toileting, which includes using toliet, bedpan, or urinal?: A Lot Help from another person bathing (including washing, rinsing, drying)?: A Lot Help from another person to put on and taking off regular upper body clothing?: A Little Help from another person to put on and taking off regular lower  body clothing?: A Lot 6 Click Score: 15   End of Session Equipment Utilized During Treatment: Rolling walker (2 wheels) Nurse Communication: Mobility status;Other (comment) (Episode of dizziness and becomeing clammy. Returned to supine.)  Activity Tolerance: Other (comment) (limited by dizziness and possible orthostatic hypotention.) Patient left: in bed;with call bell/phone within reach;with bed alarm set;with family/visitor present  OT Visit Diagnosis: Unsteadiness on feet (R26.81);Other abnormalities of gait and mobility (R26.89);History of falling (Z91.81);Pain Pain - Right/Left: Right Pain - part of body: Leg                Time: 1030-1101 OT Time Calculation (min): 31 min Charges:  OT General Charges $OT Visit: 1 Visit OT Evaluation $OT Eval Moderate Complexity: 1 Mod OT Treatments $Self Care/Home Management : 8-22 mins  Hope Budds 12/15/2023, 11:25 AM

## 2023-12-16 DIAGNOSIS — S72001A Fracture of unspecified part of neck of right femur, initial encounter for closed fracture: Secondary | ICD-10-CM | POA: Diagnosis not present

## 2023-12-16 MED ORDER — SENNOSIDES-DOCUSATE SODIUM 8.6-50 MG PO TABS
1.0000 | ORAL_TABLET | Freq: Two times a day (BID) | ORAL | Status: DC
  Administered 2023-12-16 – 2023-12-17 (×2): 1 via ORAL
  Filled 2023-12-16 (×3): qty 1

## 2023-12-16 MED ORDER — TAMSULOSIN HCL 0.4 MG PO CAPS
0.4000 mg | ORAL_CAPSULE | Freq: Every day | ORAL | Status: DC
  Administered 2023-12-16: 0.4 mg via ORAL
  Filled 2023-12-16: qty 1

## 2023-12-16 MED ORDER — HALOPERIDOL LACTATE 5 MG/ML IJ SOLN
5.0000 mg | Freq: Once | INTRAMUSCULAR | Status: AC | PRN
  Administered 2023-12-16: 5 mg via INTRAVENOUS
  Filled 2023-12-16: qty 1

## 2023-12-16 MED ORDER — MELATONIN 3 MG PO TABS
3.0000 mg | ORAL_TABLET | Freq: Every evening | ORAL | Status: DC | PRN
  Administered 2023-12-17: 3 mg via ORAL
  Filled 2023-12-16: qty 1

## 2023-12-16 NOTE — Progress Notes (Signed)
 [facility] Progress Note Patient: Oscar [name] JYN:[id number] DOB: [birth year] DOA: 12/11/2023  DOS: the patient was seen and examined on 12/16/2023  Brief Hospital Course: PMH of HFpEF, CVA, PPM implant , hypothyroidism, HLD, presented to the hospital with complaints of mechanical fall. Was found to have right hip fracture. Underwent surgery on 3/21.  Developed orthostatic requiring IV fluid on 3/24.  Now medically stable.  Assessment and Plan: Right intertrochanteric femur fracture. Orthopedic surgery was consulted. Fracture sustained after mechanical fall. Underwent cephalomedullary nailing on 3/21. Postoperatively so far doing okay. PT OT consulted.  Recommend SNF. Weightbearing, pain control per orthopedic.  CAD. HFpEF HTN Did well during the surgery. Continue aspirin and Lipitor. Blood pressure medication on hold due to orthostasis.  Orthostatic hypotension.  Resolved. Likely from poor p.o. intake as well as pain medications. Blood pressure dropped significantly to 70 systolic. Improved with IV fluid. Discontinue scheduled Robaxin and Flomax as well.  Chronic anemia. No active bleeding. H&H relatively stable postoperatively. Iron level normal.  Folate normal. B12 normal.  Reticulocyte normal. Transfuse hemoglobin less than 8.  HLD. Continue statin.  GERD. Continue PPI.  Mood disorder. Continue sertraline,  BPH. On Flomax. Currently on hold.  Resume.   Subjective: No nausea no vomiting no fever no chills.  Dizziness resolved.  Breathing improving.  Educated on incentive spirometry.  Physical Exam: General: in Mild distress, No Rash Cardiovascular: S1 and S2 Present, No Murmur Respiratory: Good respiratory effort, Bilateral Air entry present. No Crackles, No wheezes Abdomen: Bowel Sound present, No tenderness Extremities: No edema Neuro: Alert and oriented x3, no new focal deficit  Data Reviewed: I have Reviewed nursing notes, Vitals, and Lab  results. Since last encounter, pertinent lab results CBC and BMP   .  Disposition: Status is: Inpatient Remains inpatient appropriate because: Medically stable.  Awaiting placement.  SCDs Start: 12/12/23 1316 SCDs Start: 12/12/23 0222   Family Communication: No one at bedside Level of care: Med-Surg   Vitals:   12/15/23 2044 12/16/23 0604 12/16/23 0814 12/16/23 1128  BP: (!) 145/76 (!) 142/72 (!) 148/77 126/77  Pulse: 75 71 66   Resp: 18 18 18    Temp: 98.2 F (36.8 C) 98.4 F (36.9 C) 98 F (36.7 C)   TempSrc: Oral Oral    SpO2: 97% 98% 100%   Weight:      Height:         Author: Lynden Oxford, MD 12/16/2023 6:25 PM  Please look on [email or web address] to find out who is on call.

## 2023-12-16 NOTE — TOC Progression Note (Addendum)
 Transition of Care [facility]) - Progression Note    Patient Details  Name: Oscar [name] MRN: [id number] Date of Birth: 1931/06/20  [facility]) CM/SW Contact  Marliss Coots, LCSW Phone Number: 12/16/2023, 10:11 AM  Clinical Narrative:     10:11 AM CSW returned to patient's bedside to provide current SNF options ([facility], [address], [facility], [place], [place], [place], [address], [place], [facility]) with [employer]. Patient's spouse and daughter, Oscar [name], were also present at bedside. Oscar [name] inquired about transportation. CSW offered to coordinate ambulance transportation and informed Oscar [name], patient, and patient's spouse of possible copay. Oscar [name], patient, and patient's spouse expressed understanding of information.  12:09 PM Per bedside RN, patient and family accepted bed offer at [facility]. CSW called patient's daughter, Oscar [name], (patient oriented x3) who confirmed choice. CSW informed Oscar [name] of [facility] bed offer and offered to provide their information with Medicare ratings. Oscar [name] declined CSW offer and continues to confirm [facility] as choice. CSW informed [employer].  12:20 PM CSW submitted insurance authorization for SNF (ID [id number]) which is currently pending.  12:46 PM Insurance authorization was approved and is valid 03/26-03/28. Medical team and SNF made aware. SNF confirmed with CSW of bed availability for discharge tomorrow. CSW relayed bed availability to medical team.  Expected Discharge Plan: Skilled Nursing Facility Barriers to Discharge: Continued Medical Work up, SNF Pending bed offer  Expected Discharge Plan and Services In-house Referral: Clinical Social Work Discharge Planning Services: CM Consult Post Acute Care Choice: Skilled Nursing Facility Living arrangements for the past 2 months: Assisted Living Facility                                       Social Determinants of Health (SDOH)  Interventions SDOH Screenings   Food Insecurity: No Food Insecurity (12/12/2023)  Housing: High Risk (12/12/2023)  Transportation Needs: No Transportation Needs (12/12/2023)  Utilities: Not At Risk (12/12/2023)  Depression (PHQ2-9): Low Risk  (11/19/2023)  Social Connections: Moderately Integrated (12/12/2023)  Tobacco Use: Medium Risk (12/12/2023)    Readmission Risk Interventions     No data to display

## 2023-12-16 NOTE — Progress Notes (Signed)
 [facility] night cross cover note:  I was notified by RN that this patient is agitated, repeatedly attempting to get out of bed on his own, with these behaviors refractory to attempts at verbal redirection.  In the setting of associated interference with ongoing medical treatment posing potential harm to themself, I have placed order for Haldol 5 mg IV x 1 dose prn for agitation.   RN also requests a sleep aid for the patient.  I subsequently ordered as needed melatonin for insomnia.    Newton Pigg, DO Hospitalist

## 2023-12-16 NOTE — Progress Notes (Signed)
 Physical Therapy Treatment Patient Details Name: Oscar [name] MRN: [id number] DOB: 1931/06/17 Today's Date: 12/16/2023   History of Present Illness Oscar [name] is a 89 y.o. male who presents with severe right hip pain after a fall. Pt with displaced right intertrochanteric femur fracture s/p IM nailing on 3/21. Past medical history significant for atrial tachycardia, CHB with pacemaker, chronic HFpEF, and history of CVA.    PT Comments  Pt received in bed, family present. Pt assisted with LE there ex in supine and handout given to family. Pt then came to sitting with increased time but no physical assist from [facility] elevated position. Pt stood with min A. BP dropped initially but came back up with multiple stands. Pt able to take small steps fwd and back with RW and min A. Mild buckling of R knee with bkwd stepping. Pt mildly dizzy in sitting in standing but dizziness did not worsen and ROM in between transitions seemed to help him tolerate better. Pt returned to supine end of session. Patient will benefit from continued inpatient follow up therapy, <3 hours/day.  BP supine 126/76 Sitting 138/92 Standing 111/58 After multiple stands 151/72     If plan is discharge home, recommend the following: Two people to help with walking and/or transfers;A lot of help with bathing/dressing/bathroom;Assistance with cooking/housework;Direct supervision/assist for medications management;Assist for transportation;Help with stairs or ramp for entrance   Can travel by private vehicle     No  Equipment Recommendations  Rolling walker (2 wheels);Wheelchair (measurements PT);Wheelchair cushion (measurements PT);BSC/3in1;Hospital bed;Hoyer lift    Recommendations for Other Services       Precautions / Restrictions Precautions Precautions: Fall;Other (comment) (watch BP) Recall of Precautions/Restrictions: Impaired Precaution/Restrictions Comments: BP dropped when standing Restrictions Weight Bearing  Restrictions Per Provider Order: Yes RLE Weight Bearing Per Provider Order: Weight bearing as tolerated     Mobility  Bed Mobility Overal bed mobility: Needs Assistance Bed Mobility: Supine to Sit, Sit to Supine     Supine to sit: Contact guard Sit to supine: Min assist   General bed mobility comments: pt needs increased time to come to EOB from [facility] elevated but was able to achieve sitting without physical assist. Min A to RLE for return to supine    Transfers Overall transfer level: Needs assistance Equipment used: Rolling walker (2 wheels) Transfers: Sit to/from Stand Sit to Stand: Min assist           General transfer comment: cues for hand placement. Difficulty transitioning hand on bed to RW. Min A needed for support throughout    Ambulation/Gait Ambulation/Gait assistance: Mod assist Gait Distance (Feet): 3 Feet Assistive device: Rolling walker (2 wheels) Gait Pattern/deviations: Step-to pattern Gait velocity: decreased Gait velocity interpretation: <1.31 ft/sec, indicative of household ambulator   General Gait Details: able to step 3' fwd then bkwd. Has difficulty taking wt on R to step L foot and needed mod A during this phase of gait. Mild buckling R knee with bkwd stepping.   Stairs             Wheelchair Mobility     Tilt Bed    Modified Rankin (Stroke Patients Only)       Balance Overall balance assessment: Needs assistance Sitting-balance support: Feet supported, Bilateral upper extremity supported Sitting balance-Leahy Scale: Fair Sitting balance - Comments: mild dizziness in sitting, BP stable   Standing balance support: Bilateral upper extremity supported, During functional activity Standing balance-Leahy Scale: Poor Standing balance comment: remained mildly dizzy in  standing. BP did drop but came back up with subsequent sit to stands                            Communication Communication Communication: No apparent  difficulties  Cognition Arousal: Alert Behavior During Therapy: WFL for tasks assessed/performed   PT - Cognitive impairments: History of cognitive impairments                       PT - Cognition Comments: STM deficits at baseline, noted today with treatment Following commands: Intact      Cueing Cueing Techniques: Verbal cues, Tactile cues  Exercises General Exercises - Lower Extremity Ankle Circles/Pumps: AROM, Both, 20 reps, Supine Quad Sets: AROM, Both, 10 reps, Supine Gluteal Sets: AROM, Both, 10 reps, Supine Long Arc Quad: AROM, Right, 10 reps, Seated Heel Slides: AROM, Right, 5 reps, Supine Hip ABduction/ADduction: AROM, Right, Supine (1) Straight Leg Raises: AAROM, Right, Supine (3x) Heel Raises: AROM, Right, 5 reps, Standing    General Comments General comments (skin integrity, edema, etc.): BP supine 126/76, sitting 138/92, standing 111/58, standing 3rd time 151/72. LE and UE ROM performed between all transitions      Pertinent Vitals/Pain Pain Assessment Pain Assessment: Faces Faces Pain Scale: Hurts little more Pain Location: R hip Pain Descriptors / Indicators: Aching, Discomfort, Grimacing Pain Intervention(s): Limited activity within patient's tolerance, Monitored during session    Home Living                          Prior Function            PT Goals (current goals can now be found in the care plan section) Acute Rehab PT Goals Patient Stated Goal: to get better PT Goal Formulation: With patient Time For Goal Achievement: 12/27/23 Potential to Achieve Goals: Fair Progress towards PT goals: Progressing toward goals    Frequency    Min 3X/week      PT Plan      Co-evaluation              AM-PAC PT "6 Clicks" Mobility   Outcome Measure  Help needed turning from your back to your side while in a flat bed without using bedrails?: A Little Help needed moving from lying on your back to sitting on the side of a flat  bed without using bedrails?: A Little Help needed moving to and from a bed to a chair (including a wheelchair)?: A Lot Help needed standing up from a chair using your arms (e.g., wheelchair or bedside chair)?: A Lot Help needed to walk in hospital room?: A Lot Help needed climbing 3-5 steps with a railing? : Total 6 Click Score: 13    End of Session Equipment Utilized During Treatment: Gait belt Activity Tolerance: Patient tolerated treatment well Patient left: in bed;with call bell/phone within reach;with family/visitor present;with bed alarm set Nurse Communication: Mobility status PT Visit Diagnosis: Other abnormalities of gait and mobility (R26.89)     Time: 6440-3474 PT Time Calculation (min) (ACUTE ONLY): 40 min  Charges:    $Gait Training: 8-22 mins $Therapeutic Exercise: 8-22 mins $Therapeutic Activity: 8-22 mins PT General Charges $$ ACUTE PT VISIT: 1 Visit                     Lyanne Co, PT  Acute Rehab Services Secure chat preferred Office [phone]  [employer] 12/16/2023, 3:47 PM

## 2023-12-16 NOTE — Plan of Care (Signed)

## 2023-12-17 DIAGNOSIS — I2510 Atherosclerotic heart disease of native coronary artery without angina pectoris: Secondary | ICD-10-CM | POA: Insufficient documentation

## 2023-12-17 DIAGNOSIS — N139 Obstructive and reflux uropathy, unspecified: Secondary | ICD-10-CM | POA: Insufficient documentation

## 2023-12-17 DIAGNOSIS — I471 Supraventricular tachycardia, unspecified: Secondary | ICD-10-CM | POA: Insufficient documentation

## 2023-12-17 DIAGNOSIS — M6281 Muscle weakness (generalized): Secondary | ICD-10-CM | POA: Insufficient documentation

## 2023-12-17 DIAGNOSIS — S72001A Fracture of unspecified part of neck of right femur, initial encounter for closed fracture: Secondary | ICD-10-CM | POA: Diagnosis not present

## 2023-12-17 DIAGNOSIS — N401 Enlarged prostate with lower urinary tract symptoms: Secondary | ICD-10-CM | POA: Insufficient documentation

## 2023-12-17 DIAGNOSIS — I69391 Dysphagia following cerebral infarction: Secondary | ICD-10-CM | POA: Insufficient documentation

## 2023-12-17 DIAGNOSIS — W19XXXA Unspecified fall, initial encounter: Secondary | ICD-10-CM | POA: Insufficient documentation

## 2023-12-17 DIAGNOSIS — R1312 Dysphagia, oropharyngeal phase: Secondary | ICD-10-CM | POA: Insufficient documentation

## 2023-12-17 DIAGNOSIS — R269 Unspecified abnormalities of gait and mobility: Secondary | ICD-10-CM | POA: Insufficient documentation

## 2023-12-17 DIAGNOSIS — R41841 Cognitive communication deficit: Secondary | ICD-10-CM | POA: Insufficient documentation

## 2023-12-17 DIAGNOSIS — I6990 Unspecified sequelae of unspecified cerebrovascular disease: Secondary | ICD-10-CM | POA: Insufficient documentation

## 2023-12-17 MED ORDER — ACETAMINOPHEN 500 MG PO TABS: 1000.0000 mg | ORAL_TABLET | Freq: Three times a day (TID) | ORAL | 0 refills | Status: AC

## 2023-12-17 MED ORDER — BENZONATATE 100 MG PO CAPS: 100.0000 mg | ORAL_CAPSULE | Freq: Three times a day (TID) | ORAL | 0 refills | Status: DC

## 2023-12-17 MED ORDER — MELATONIN 3 MG PO TABS: 3.0000 mg | ORAL_TABLET | Freq: Every evening | ORAL | 0 refills | Status: DC | PRN

## 2023-12-17 MED ORDER — DM-GUAIFENESIN ER 30-600 MG PO TB12: 1.0000 | ORAL_TABLET | Freq: Two times a day (BID) | ORAL | 0 refills | Status: AC

## 2023-12-17 MED ORDER — ADULT MULTIVITAMIN W/MINERALS CH: 1.0000 | ORAL_TABLET | Freq: Every day | ORAL | 0 refills | Status: AC

## 2023-12-17 MED ORDER — SENNOSIDES-DOCUSATE SODIUM 8.6-50 MG PO TABS: 1.0000 | ORAL_TABLET | Freq: Two times a day (BID) | ORAL | 0 refills | Status: DC

## 2023-12-17 NOTE — TOC Transition Note (Signed)
 Transition of Care [facility]) - Discharge Note   Patient Details  Name: Oscar [name] MRN: [id number] Date of Birth: 1931/07/29  [facility] CM/SW Contact:  Marliss Coots, LCSW Phone Number: 12/17/2023, 9:59 AM   Clinical Narrative:     Patient will DC to: [facility] Anticipated DC date: 12/17/2023 Family notified: Shaughn Thomley; Daughter; [phone] Transport by: Sharin Mons   Per MD patient ready for DC to [facility]. RN to call report prior to discharge [phone]). RN, patient's family, and facility notified of DC (patient currently disoriented x3). Discharge Summary and FL2 sent to facility. DC packet on chart. Ambulance transport requested for patient.   CSW will sign off for now as social work intervention is no longer needed. Please consult [place] again if new needs arise.   Final next level of care: Skilled Nursing Facility Barriers to Discharge: Barriers Resolved   Patient Goals and CMS Choice Patient states their goals for this hospitalization and ongoing recovery are:: SNF          Discharge Placement              Patient chooses bed at: [facility] Patient to be transferred to facility by: [facility] Name of family member notified: Martyn Timme; Daughter; [phone] Patient and family notified of of transfer: 12/17/23  Discharge Plan and Services Additional resources added to the After Visit Summary for   In-house Referral: Clinical Social Work Discharge Planning Services: CM Consult Post Acute Care Choice: Skilled Nursing Facility                               Social Drivers of Health (SDOH) Interventions SDOH Screenings   Food Insecurity: No Food Insecurity (12/12/2023)  Housing: High Risk (12/12/2023)  Transportation Needs: No Transportation Needs (12/12/2023)  Utilities: Not At Risk (12/12/2023)  Depression (PHQ2-9): Low Risk  (11/19/2023)  Social Connections: Moderately Integrated (12/12/2023)  Tobacco Use: Medium  Risk (12/12/2023)     Readmission Risk Interventions     No data to display

## 2023-12-17 NOTE — Progress Notes (Signed)
 Report called and given to [facility] LPN.

## 2023-12-17 NOTE — Discharge Summary (Signed)
 Physician Discharge Summary   Patient: Oscar [name] MRN: [id number] DOB: [birth year]  Admit date:     12/11/2023  Discharge date: 12/17/23  Discharge Physician: Kendell Bane   PCP: Sharlene Dory, DO   Recommendations at discharge:    Follow-up PCP in 1 week Follow-up with orthopedic team in 1-2 weeks Continue PT OT, with fall precautions, DVT prophylaxis per Ortho recommendation, continue to ambulate, SCDs and compression stockings  Discharge Diagnoses: Principal Problem:   Closed right hip fracture, initial encounter [facility]) Active Problems:   Coronary artery disease involving native coronary artery of native heart without angina pectoris   Normocytic anemia   Chronic heart failure with preserved ejection fraction (HFpEF) [facility])    Hospital Course: PMH of HFpEF, CVA, PPM implant , hypothyroidism, HLD, presented to the hospital with complaints of mechanical fall. Was found to have right hip fracture. Underwent surgery on 3/21.  Developed orthostatic requiring IV fluid on 3/24.  Now medically stable.  Assessment and Plan: Right intertrochanteric femur fracture. Orthopedic surgery was consulted. Fracture sustained after mechanical fall. Underwent cephalomedullary nailing on 3/21. Postoperatively so far doing okay. PT OT consulted.  Recommend SNF. Weightbearing, pain control per orthopedic.  CAD. HFpEF HTN Did well during the surgery. Continue aspirin and Lipitor. Blood pressure medication on hold due to orthostasis.  Orthostatic hypotension.  Resolved. Likely from poor p.o. intake as well as pain medications. Blood pressure dropped significantly to 70 systolic. Improved with IV fluid. Discontinue scheduled Robaxin and Flomax as well.  Chronic anemia. No active bleeding. H&H relatively stable postoperatively. Iron level normal.  Folate normal. B12 normal.  Reticulocyte normal. Transfuse hemoglobin less than 8.  HLD. Continue  statin.  GERD. Continue PPI.  Mood disorder. Continue sertraline,  BPH. On Flomax. Currently on hold.  Resume tomorrow.  Assessment and Plan: No notes have been filed under this hospital service. Service: Hospitalist        Disposition: Skilled nursing facility Diet recommendation:  Regular diet DISCHARGE MEDICATION: Allergies as of 12/17/2023   No Known Allergies      Medication List     STOP taking these medications    famotidine 20 MG tablet Commonly known as: PEPCID   furosemide 20 MG tablet Commonly known as: LASIX       TAKE these medications    acetaminophen 500 MG tablet Commonly known as: TYLENOL Take 2 tablets (1,000 mg total) by mouth every 8 (eight) hours. What changed:  medication strength how much to take when to take this reasons to take this   aspirin EC 325 MG tablet Take 1 tablet (325 mg total) by mouth daily.   atorvastatin 40 MG tablet Commonly known as: LIPITOR Take 1 tablet (40 mg total) by mouth daily.   benzonatate 100 MG capsule Commonly known as: TESSALON Take 1 capsule (100 mg total) by mouth 3 (three) times daily.   Cholecalciferol 125 MCG (5000 UT) Tabs Take 1 tablet by mouth daily.   dextromethorphan-guaiFENesin 30-600 MG 12hr tablet Commonly known as: MUCINEX DM Take 1 tablet by mouth 2 (two) times daily for 5 days.   dorzolamide-timolol 2-0.5 % ophthalmic solution Commonly known as: COSOPT Place 1 drop into the left eye 2 (two) times daily.   ferrous sulfate 325 (65 FE) MG EC tablet Take 1 tablet by mouth 3 (three) times a week.   melatonin 3 MG Tabs tablet Take 1 tablet (3 mg total) by mouth at bedtime as needed (insomnia).   methocarbamol 500 MG tablet  Commonly known as: ROBAXIN Take 1 tablet (500 mg total) by mouth 3 (three) times daily.   multivitamin with minerals Tabs tablet Take 1 tablet by mouth daily.   oxyCODONE 5 MG immediate release tablet Commonly known as: Oxy IR/ROXICODONE Take 1  tablet (5 mg total) by mouth every 4 (four) hours as needed for severe pain (pain score 7-10).   pantoprazole 40 MG tablet Commonly known as: PROTONIX Take 1 tablet (40 mg total) by mouth daily.   senna-docusate 8.6-50 MG tablet Commonly known as: Senokot-S Take 1 tablet by mouth 2 (two) times daily.   tamsulosin 0.4 MG Caps capsule Commonly known as: FLOMAX Take 1 capsule (0.4 mg total) by mouth daily.   Vyzulta 0.024 % Soln Generic drug: Latanoprostene Bunod Place 1 drop into the left eye at bedtime.        Follow-up Information     Haddix, Gillie Manners, MD Follow up in 2 week(s).   Specialty: Orthopedic Surgery Why: for wound check and repeat x-rays Contact information: [address] [place] [address] [phone]                Discharge Exam: Ceasar Mons Weights   12/12/23 0940  Weight: 63.5 kg        General:  AAO x 2,  no distress;   HEENT:  Normocephalic, PERRL, otherwise with in Normal limits   Neuro:  CNII-XII intact. , normal motor and sensation, reflexes intact   Lungs:   Clear to auscultation BL, Respirations unlabored,  No wheezes / crackles  Cardio:    S1/S2, RRR, No murmure, No Rubs or Gallops   Abdomen:  Soft, non-tender, bowel sounds active all four quadrants, no guarding or peritoneal signs.  Muscular  skeletal:  Limited exam -global generalized weaknesses - in bed, able to move all 4 extremities,   2+ pulses,  symmetric, No pitting edema  Skin:  Dry, warm to touch, negative for any Rashes,  Wounds: Please see nursing documentation          Condition at discharge: fair  The results of significant diagnostics from this hospitalization (including imaging, microbiology, ancillary and laboratory) are listed below for reference.   Imaging Studies: DG FEMUR, MIN 2 VIEWS RIGHT Result Date: 12/12/2023 CLINICAL DATA:  Intertrochanteric fracture fixation with intramedullary rod. EXAM: RIGHT FEMUR 2 VIEWS COMPARISON:  Pelvis and right hip  radiographs 12/11/2023 FINDINGS: Images were performed intraoperatively without the presence of a radiologist. Redemonstration of proximal femoral intertrochanteric fracture. The patient is undergoing cephalomedullary nail fixation of the proximal right femur. No hardware complication is seen. Total fluoroscopy images: 5 Total fluoroscopy time: 44 seconds Total dose: Radiation Exposure Index (as provided by the fluoroscopic device): 5.27 mGy air Kerma Please see intraoperative findings for further detail. IMPRESSION: Intraoperative fluoroscopic guidance for cephalomedullary nail fixation of the proximal right femur. Electronically Signed   By: Neita Garnet M.D.   On: 12/12/2023 13:10   DG HIP PORT UNILAT W OR W/O PELVIS 1V RIGHT Result Date: 12/12/2023 CLINICAL DATA:  Postoperative right hip.  Fracture. EXAM: DG HIP (WITH OR WITHOUT PELVIS) 1V PORT RIGHT COMPARISON:  Pelvis and right hip radiographs 12/11/2023 FINDINGS: Interval cephalomedullary nail fixation of the previously seen intertrochanteric proximal femoral fracture. No perihardware lucency is seen to indicate hardware failure or loosening. There is improved, near anatomic alignment. Expected postoperative moderate lateral right hip soft tissue swelling and mild subcutaneous air. IMPRESSION: Interval cephalomedullary nail fixation of the previously seen intertrochanteric proximal femoral fracture. No  evidence of hardware failure. Electronically Signed   By: Neita Garnet M.D.   On: 12/12/2023 13:08   DG C-Arm 1-60 Min-No Report Result Date: 12/12/2023 Fluoroscopy was utilized by the requesting physician.  No radiographic interpretation.   DG Chest 1 View Result Date: 12/11/2023 CLINICAL DATA:  Status post fall with hip fracture. EXAM: CHEST  1 VIEW COMPARISON:  Chest radiograph 10/08/2020 FINDINGS: Left-sided pacemaker in place.The cardiomediastinal contours are normal. Aortic atherosclerosis. The lungs are clear. Pulmonary vasculature is normal.  No consolidation, pleural effusion, or pneumothorax. No acute osseous abnormalities are seen. IMPRESSION: No acute findings. Electronically Signed   By: Narda Rutherford M.D.   On: 12/11/2023 23:40   DG Hip Unilat With Pelvis 2-3 Views Right Result Date: 12/11/2023 CLINICAL DATA:  Pain after fall. EXAM: DG HIP (WITH OR WITHOUT PELVIS) 2-3V RIGHT COMPARISON:  None Available. FINDINGS: Displaced intertrochanteric right proximal femur fracture. Involvement of the greater trochanter and base of the lesser trochanter. Femoral head remains seated. Mild bilateral hip osteoarthritis. Intact pubic rami. Degenerative change of the sacroiliac joints. IMPRESSION: Displaced intertrochanteric right proximal femur fracture. Electronically Signed   By: Narda Rutherford M.D.   On: 12/11/2023 23:39    Microbiology: Results for orders placed or performed during the hospital encounter of 12/26/20  SARS CORONAVIRUS 2 (TAT 6-24 HRS) Nasopharyngeal Nasopharyngeal Swab     Status: None   Collection Time: 12/26/20 10:18 AM   Specimen: Nasopharyngeal Swab  Result Value Ref Range Status   SARS Coronavirus 2 NEGATIVE NEGATIVE Final    Comment: (NOTE) SARS-CoV-2 target nucleic acids are NOT DETECTED.  The SARS-CoV-2 RNA is generally detectable in upper and lower respiratory specimens during the acute phase of infection. Negative results do not preclude SARS-CoV-2 infection, do not rule out co-infections with other pathogens, and should not be used as the sole basis for treatment or other patient management decisions. Negative results must be combined with clinical observations, patient history, and epidemiological information. The expected result is Negative.  Fact Sheet for Patients: HairSlick.no  Fact Sheet for Healthcare Providers: quierodirigir.com  This test is not yet approved or cleared by the [place] FDA and  has been authorized for detection  and/or diagnosis of SARS-CoV-2 by FDA under an Emergency Use Authorization (EUA). This EUA will remain  in effect (meaning this test can be used) for the duration of the COVID-19 declaration under Se ction 564(b)(1) of the Act, 21 U.S.C. section 360bbb-3(b)(1), unless the authorization is terminated or revoked sooner.  Performed at [facility], [address], [place], [place] [address]     Labs: CBC: Recent Labs  Lab 12/11/23 2318 12/12/23 0500 12/13/23 1249 12/14/23 0424 12/15/23 1014  WBC 8.1 7.9 7.9 7.1 7.5  NEUTROABS 6.8  --   --   --   --   HGB 8.6* 9.7* 9.7* 8.3* 8.8*  HCT 28.4* 31.0* 30.4* 25.2* 28.0*  MCV 91.0 89.1 87.1 85.7 88.1  PLT 156 188 175 163 197   Basic Metabolic Panel: Recent Labs  Lab 12/11/23 2318 12/12/23 0500 12/13/23 0411 12/14/23 0424 12/15/23 1014  NA 137 137  --  135 134*  K 3.7 3.9  --  3.9 4.5  CL 107 108  --  104 104  CO2 20* 21*  --  25 23  GLUCOSE 118* 101*  --  124* 117*  BUN 25* 21  --  15 17  CREATININE 0.94 0.84  --  0.96 0.90  CALCIUM 8.4* 8.5*  --  8.4* 8.5*  MG  --   --  1.9 2.0 2.0   Liver Function Tests: No results for input(s): "AST", "ALT", "ALKPHOS", "BILITOT", "PROT", "ALBUMIN" in the last 168 hours. CBG: No results for input(s): "GLUCAP" in the last 168 hours.  Discharge time spent: greater than 30 minutes.  Signed: Kendell Bane, MD [facility] 12/17/2023

## 2024-01-06 ENCOUNTER — Telehealth: Payer: Self-pay

## 2024-01-06 NOTE — Transitions of Care (Post Inpatient/ED Visit) (Unsigned)
   01/06/2024  Name: Oscar [name] MRN: [id number] DOB: [birth year]  Today's TOC FU Call Status: Today's TOC FU Call Status:: Unsuccessful Call (1st Attempt) Unsuccessful Call (1st Attempt) Date: 01/06/24  Attempted to reach the patient regarding the most recent Inpatient/ED visit.  Follow Up Plan: Additional outreach attempts will be made to reach the patient to complete the Transitions of Care (Post Inpatient/ED visit) call.   Signature Darrall Ellison, LPN [facility] Nurse Health Advisor Direct Dial [phone]

## 2024-01-07 NOTE — Transitions of Care (Post Inpatient/ED Visit) (Signed)
 01/07/2024  Name: Oscar [name] MRN: [id number] DOB: 25-Apr-1931  Today's TOC FU Call Status: Today's TOC FU Call Status:: Successful TOC FU Call Completed Unsuccessful Call (1st Attempt) Date: 01/06/24 [facility] FU Call Complete Date: 01/07/24 Patient's Name and Date of Birth confirmed.  Transition Care Management Follow-up Telephone Call Date of Discharge: 01/05/24 Discharge Facility: Other [facility]) Name of Other (Non[facility]) Discharge Facility: [facility] Type of Discharge: Inpatient Admission Primary Inpatient Discharge Diagnosis:: dysphagia How have you been since you were released from the hospital?: Better Any questions or concerns?: No  Items Reviewed: Did you receive and understand the discharge instructions provided?: Yes Medications obtained,verified, and reconciled?: Yes (Medications Reviewed) Any new allergies since your discharge?: No Dietary orders reviewed?: Yes Do you have support at home?: Yes People in Home [RPT]: spouse, child(ren), adult  Medications Reviewed Today: Medications Reviewed Today     Reviewed by Karena Addison, LPN (Licensed Practical Nurse) on 01/07/24 at 1126  Med List Status: <None>   Medication Order Taking? Sig Documenting Provider Last Dose Status Informant  acetaminophen (TYLENOL) 500 MG tablet [id number]  Take 2 tablets (1,000 mg total) by mouth every 8 (eight) hours. Kendell Bane, MD  Active   aspirin EC 325 MG tablet [id number]  Take 1 tablet (325 mg total) by mouth daily. [place] Bali, PA-C  Active   atorvastatin (LIPITOR) 40 MG tablet [id number] No Take 1 tablet (40 mg total) by mouth daily.  Patient not taking: Reported on 12/12/2023   Revankar, Aundra Dubin, MD Not Taking Active Self, Pharmacy Records, Multiple Informants  benzonatate (TESSALON) 100 MG capsule [id number]  Take 1 capsule (100 mg total) by mouth 3 (three) times daily. Kendell Bane, MD  Active   Cholecalciferol 125 MCG (5000 UT) TABS [id number] No Take 1  tablet by mouth daily. [provider] Unknown Active Self, Pharmacy Records, Multiple Informants  dorzolamide-timolol (COSOPT) 2-0.5 % ophthalmic solution [id number] No Place 1 drop into the left eye 2 (two) times daily. [provider] Unknown Active Self, Pharmacy Records, Multiple Informants           Med Note Cheron Schaumann, [place]   Fri Dec 12, 2023  8:42 AM) 07/24/2023 2-0.5 % SOLN (disp 10, 80d supply)  ferrous sulfate 325 (65 FE) MG EC tablet [id number] No Take 1 tablet by mouth 3 (three) times a week. [provider] Unknown Active Self, Pharmacy Records, Multiple Informants  melatonin 3 MG TABS tablet [id number]  Take 1 tablet (3 mg total) by mouth at bedtime as needed (insomnia). Kendell Bane, MD  Active   methocarbamol (ROBAXIN) 500 MG tablet [id number]  Take 1 tablet (500 mg total) by mouth 3 (three) times daily. [place] Bali, PA-C  Active   Multiple Vitamin (MULTIVITAMIN WITH MINERALS) TABS tablet [id number]  Take 1 tablet by mouth daily. Kendell Bane, MD  Active   oxyCODONE (OXY IR/ROXICODONE) 5 MG immediate release tablet [id number]  Take 1 tablet (5 mg total) by mouth every 4 (four) hours as needed for severe pain (pain score 7-10). West Bali, PA-C  Active   pantoprazole (PROTONIX) 40 MG tablet [id number] No Take 1 tablet (40 mg total) by mouth daily. Sharlene Dory, DO 12/11/2023 Active Self, Pharmacy Records, Multiple Informants  senna-docusate (SENOKOT-S) 8.6-50 MG tablet [id number]  Take 1 tablet by mouth 2 (two) times daily. Kendell Bane, MD  Active   tamsulosin (FLOMAX) 0.4 MG CAPS capsule [id number] No Take 1 capsule (0.4 mg total) by  mouth daily. Jobe Mulder, DO 12/11/2023 Active Self, Pharmacy Records, Multiple Informants  VYZULTA 0.024 % SOLN [id number] No Place 1 drop into the left eye at bedtime. [provider] Unknown Active Self, Pharmacy Records, Multiple Informants           Med Note Alline Ivans   Fri Dec 12, 2023  8:38 AM) LF: 07/24/2023 80  Med List Note Alline Ivans, CPhT 12/12/23 1308): [facility] [phone] Rx delivery -  [place] [facility] [phone], do not call the wife due to memory issues             Home Care and Equipment/Supplies: Were Home Health Services Ordered?: NA Any new equipment or medical supplies ordered?: NA  Functional Questionnaire: Do you need assistance with bathing/showering or dressing?: No Do you need assistance with meal preparation?: No Do you need assistance with eating?: No Do you have difficulty maintaining continence: No Do you need assistance with getting out of bed/getting out of a chair/moving?: No Do you have difficulty managing or taking your medications?: No  Follow up appointments reviewed: PCP Follow-up appointment confirmed?: Yes Date of PCP follow-up appointment?: 01/08/24 Follow-up Provider: [facility] Follow-up appointment confirmed?: No Follow-Up Specialty Provider:: GI Reason Specialist Follow-Up Not Confirmed: Patient has Specialist Provider Number and will Call for Appointment Do you need transportation to your follow-up appointment?: No Do you understand care options if your condition(s) worsen?: Yes-patient verbalized understanding    SIGNATURE Darrall Ellison, LPN [facility] Nurse Health Advisor Direct Dial [phone]

## 2024-01-08 ENCOUNTER — Inpatient Hospital Stay: Admitting: Family Medicine

## 2024-01-13 ENCOUNTER — Telehealth: Payer: Self-pay | Admitting: Family Medicine

## 2024-01-13 NOTE — Telephone Encounter (Signed)
 Copied from CRM [id number]. Topic: Medicare AWV >> Jan 13, 2024  1:23 PM Juliana Ocean wrote: Reason for CRM: Called 01/13/2024 to sched AWV - NO VOICEMAIL  Rosalee Collins; Care Guide Ambulatory Clinical Support [place] [facility] Direct Dial: [phone]

## 2024-01-19 ENCOUNTER — Encounter: Payer: Self-pay | Admitting: Family Medicine

## 2024-01-19 ENCOUNTER — Ambulatory Visit: Admitting: Family Medicine

## 2024-01-19 VITALS — BP 128/74 | HR 81 | Temp 98.0°F | Resp 16 | Ht 68.0 in | Wt 140.0 lb

## 2024-01-19 DIAGNOSIS — D649 Anemia, unspecified: Secondary | ICD-10-CM | POA: Diagnosis not present

## 2024-01-19 DIAGNOSIS — S72001A Fracture of unspecified part of neck of right femur, initial encounter for closed fracture: Secondary | ICD-10-CM

## 2024-01-19 NOTE — Progress Notes (Signed)
 Chief Complaint  Patient presents with   Follow-up    Follow up Fall     Subjective: Patient is a 89 y.o. male here for f/u fall. Here w wife and aide who helps w hx.   On 12/11/2023, the patient was walking his dog and fell down a small hill.  He fractured his right hip.  He required internal fixation with Ortho.  Since that time his pain has been minimal.  He is working with physical therapy and getting around fairly well with a walker.  He does have some swelling in his right lower extremity.  He is compliant with his medication.  He denies any pain in his calf or prolonged bedrest outside of his surgery.  No chest pain or shortness of breath.  He was discharged with several new medicines that he is unsure if he needs to be taking including melatonin, senna, iron, and methocarbamol .  Past Medical History:  Diagnosis Date   Abnormal nuclear stress test    Abnormal results of thyroid  function studies 01/19/2014   Formatting of this note might be different from the original. August 2016  TSH 4.01   Apneic episode 11/29/2020   Ascending aorta dilatation (HCC) 12/07/2020   Atrial tachycardia, paroxysmal (HCC) 08/05/2019   Bunion of great toe of right foot 09/18/2016   Centrilobular emphysema (HCC) 11/26/2020   Cervical stenosis of spine    CHB (complete heart block) (HCC) 09/28/2020   Coronary artery disease involving native coronary artery of native heart without angina pectoris 08/05/2019   CVA (cerebral vascular accident) (HCC) 03/12/2020   Encounter for general adult medical examination without abnormal findings 05/20/2016   Essential hypertension 03/12/2020   Exertional dyspnea 01/18/2020   Gastro-esophageal reflux disease with esophagitis 09/28/2010   GERD (gastroesophageal reflux disease)    History of bladder cancer 03/14/2014   History of concussion    X2   NO RESIDUALS   History of peptic ulcer 1970'S   Hypercholesterolemia    Hypothyroidism due to Hashimoto's thyroiditis  07/23/2019   LBBB (left bundle branch block)    Macular degeneration of left eye    Mixed hyperlipidemia 11/24/2013   Formatting of this note might be different from the original. History of TIA.  The goal is to have your total cholesterol < 200, the HDL (good cholesterol) >40, and the LDL (bad cholesterol) <100. It is recomended that you follow a good low fat diet and exercise for 30 minutes 3-4 times a week.   Personal history of tobacco use, presenting hazards to health 01/12/2014   Presence of permanent cardiac pacemaker 11/15/2020   S/P bilateral cataract extraction 09/18/2016   Serum reaction due to vaccination 09/27/2011   Formatting of this note might be different from the original. IMPRESSION: seen on Wednesday for reaction to right arm after pneumovax, had redness, warmth , tenderness and edema of right upper arm which is now resolved. monitor area. use warm compresses prn. call prn. discussed will not need another pneumovax, he will consider zostavax in future.   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 No accessory muscle use MSK: Negative Homans on the right, no calf TTP bilaterally Psych: Age appropriate judgment and insight, normal affect and mood  Assessment and Plan: Low hemoglobin - Plan: CBC, IBC + Ferritin  Closed fracture of right hip, initial encounter (HCC)  F/u on this. Unsure why he is on Fe as is he. Will ck labs today.  Seems to be doing fairly well despite situation. Cont care. Went  thru meds from DC and med list updated.  Consider OTC compression stockings, elevate legs, mind salt intake.  Low risk of DVT. The patient and his aide voiced understanding and agreement to the plan.  I spent 21 minutes with the patient discussing the above plans in addition to reviewing his chart on the same day of the visit.  Shellie Dials [place], DO 01/19/24  3:40 PM

## 2024-01-19 NOTE — Patient Instructions (Addendum)
 For the swelling in your lower extremities, be sure to elevate your legs when able, mind the salt intake, stay physically active and consider wearing compression stockings.  Give us  2-3 business days to get the results of your labs back.   Let us  know if you need anything.  EXERCISES RANGE OF MOTION (ROM) AND STRETCHING EXERCISES  These exercises may help you when beginning to rehabilitate your issue. In order to successfully resolve your symptoms, you must improve your posture. These exercises are designed to help reduce the forward-head and rounded-shoulder posture which contributes to this condition. Your symptoms may resolve with or without further involvement from your physician, physical therapist or athletic trainer. While completing these exercises, remember:  Restoring tissue flexibility helps normal motion to return to the joints. This allows healthier, less painful movement and activity. An effective stretch should be held for at least 20 seconds, although you may need to begin with shorter hold times for comfort. A stretch should never be painful. You should only feel a gentle lengthening or release in the stretched tissue. Do not do any stretch or exercise that you cannot tolerate.  STRETCH- Axial Extensors Lie on your back on the floor. You may bend your knees for comfort. Place a rolled-up hand towel or dish towel, about 2 inches in diameter, under the part of your head that makes contact with the floor. Gently tuck your chin, as if trying to make a "double chin," until you feel a gentle stretch at the base of your head. Hold 15-20 seconds. Repeat 2-3 times. Complete this exercise 1 time per day.   STRETCH - Axial Extension  Stand or sit on a firm surface. Assume a good posture: chest up, shoulders drawn back, abdominal muscles slightly tense, knees unlocked (if standing) and feet hip width apart. Slowly retract your chin so your head slides back and your chin slightly lowers.  Continue to look straight ahead. You should feel a gentle stretch in the back of your head. Be certain not to feel an aggressive stretch since this can cause headaches later. Hold for 15-20 seconds. Repeat 2-3 times. Complete this exercise 1 time per day.  STRETCH - Cervical Side Bend  Stand or sit on a firm surface. Assume a good posture: chest up, shoulders drawn back, abdominal muscles slightly tense, knees unlocked (if standing) and feet hip width apart. Without letting your nose or shoulders move, slowly tip your right / left ear to your shoulder until your feel a gentle stretch in the muscles on the opposite side of your neck. Hold 15-20 seconds. Repeat 2-3 times. Complete this exercise 1-2 times per day.  STRETCH - Cervical Rotators  Stand or sit on a firm surface. Assume a good posture: chest up, shoulders drawn back, abdominal muscles slightly tense, knees unlocked (if standing) and feet hip width apart. Keeping your eyes level with the ground, slowly turn your head until you feel a gentle stretch along the back and opposite side of your neck. Hold 15-20 seconds. Repeat 2-3 times. Complete this exercise 1-2 times per day.  RANGE OF MOTION - Neck Circles  Stand or sit on a firm surface. Assume a good posture: chest up, shoulders drawn back, abdominal muscles slightly tense, knees unlocked (if standing) and feet hip width apart. Gently roll your head down and around from the back of one shoulder to the back of the other. The motion should never be forced or painful. Repeat the motion 10-20 times, or until you feel  the neck muscles relax and loosen. Repeat 2-3 times. Complete the exercise 1-2 times per day. STRENGTHENING EXERCISES - Cervical Strain and Sprain These exercises may help you when beginning to rehabilitate your injury. They may resolve your symptoms with or without further involvement from your physician, physical therapist, or athletic trainer. While completing these  exercises, remember:  Muscles can gain both the endurance and the strength needed for everyday activities through controlled exercises. Complete these exercises as instructed by your physician, physical therapist, or athletic trainer. Progress the resistance and repetitions only as guided. You may experience muscle soreness or fatigue, but the pain or discomfort you are trying to eliminate should never worsen during these exercises. If this pain does worsen, stop and make certain you are following the directions exactly. If the pain is still present after adjustments, discontinue the exercise until you can discuss the trouble with your clinician.  STRENGTH - Cervical Flexors, Isometric Face a wall, standing about 6 inches away. Place a small pillow, a ball about 6-8 inches in diameter, or a folded towel between your forehead and the wall. Slightly tuck your chin and gently push your forehead into the soft object. Push only with mild to moderate intensity, building up tension gradually. Keep your jaw and forehead relaxed. Hold 10 to 20 seconds. Keep your breathing relaxed. Release the tension slowly. Relax your neck muscles completely before you start the next repetition. Repeat 2-3 times. Complete this exercise 1 time per day.  STRENGTH- Cervical Lateral Flexors, Isometric  Stand about 6 inches away from a wall. Place a small pillow, a ball about 6-8 inches in diameter, or a folded towel between the side of your head and the wall. Slightly tuck your chin and gently tilt your head into the soft object. Push only with mild to moderate intensity, building up tension gradually. Keep your jaw and forehead relaxed. Hold 10 to 20 seconds. Keep your breathing relaxed. Release the tension slowly. Relax your neck muscles completely before you start the next repetition. Repeat 2-3 times. Complete this exercise 1 time per day.  STRENGTH - Cervical Extensors, Isometric  Stand about 6 inches away from a wall.  Place a small pillow, a ball about 6-8 inches in diameter, or a folded towel between the back of your head and the wall. Slightly tuck your chin and gently tilt your head back into the soft object. Push only with mild to moderate intensity, building up tension gradually. Keep your jaw and forehead relaxed. Hold 10 to 20 seconds. Keep your breathing relaxed. Release the tension slowly. Relax your neck muscles completely before you start the next repetition. Repeat 2-3 times. Complete this exercise 1 time per day.  POSTURE AND BODY MECHANICS CONSIDERATIONS Keeping correct posture when sitting, standing or completing your activities will reduce the stress put on different body tissues, allowing injured tissues a chance to heal and limiting painful experiences. The following are general guidelines for improved posture. Your physician or physical therapist will provide you with any instructions specific to your needs. While reading these guidelines, remember: The exercises prescribed by your provider will help you have the flexibility and strength to maintain correct postures. The correct posture provides the optimal environment for your joints to work. All of your joints have less wear and tear when properly supported by a spine with good posture. This means you will experience a healthier, less painful body. Correct posture must be practiced with all of your activities, especially prolonged sitting and standing. Correct posture  is as important when doing repetitive low-stress activities (typing) as it is when doing a single heavy-load activity (lifting).  PROLONGED STANDING WHILE SLIGHTLY LEANING FORWARD When completing a task that requires you to lean forward while standing in one place for a long time, place either foot up on a stationary 2- to 4-inch high object to help maintain the best posture. When both feet are on the ground, the low back tends to lose its slight inward curve. If this curve flattens  (or becomes too large), then the back and your other joints will experience too much stress, fatigue more quickly, and can cause pain.   RESTING POSITIONS Consider which positions are most painful for you when choosing a resting position. If you have pain with flexion-based activities (sitting, bending, stooping, squatting), choose a position that allows you to rest in a less flexed posture. You would want to avoid curling into a fetal position on your side. If your pain worsens with extension-based activities (prolonged standing, working overhead), avoid resting in an extended position such as sleeping on your stomach. Most people will find more comfort when they rest with their spine in a more neutral position, neither too rounded nor too arched. Lying on a non-sagging bed on your side with a pillow between your knees, or on your back with a pillow under your knees will often provide some relief. Keep in mind, being in any one position for a prolonged period of time, no matter how correct your posture, can still lead to stiffness.  WALKING Walk with an upright posture. Your ears, shoulders, and hips should all line up. OFFICE WORK When working at a desk, create an environment that supports good, upright posture. Without extra support, muscles fatigue and lead to excessive strain on joints and other tissues.  CHAIR: A chair should be able to slide under your desk when your back makes contact with the back of the chair. This allows you to work closely. The chair's height should allow your eyes to be level with the upper part of your monitor and your hands to be slightly lower than your elbows. Body position: Your feet should make contact with the floor. If this is not possible, use a foot rest. Keep your ears over your shoulders. This will reduce stress on your neck and low back.

## 2024-01-20 ENCOUNTER — Encounter: Payer: Self-pay | Admitting: Family Medicine

## 2024-01-20 ENCOUNTER — Other Ambulatory Visit: Payer: Self-pay | Admitting: Family Medicine

## 2024-01-20 ENCOUNTER — Other Ambulatory Visit: Payer: Self-pay

## 2024-01-20 DIAGNOSIS — E611 Iron deficiency: Secondary | ICD-10-CM

## 2024-01-20 LAB — IBC + FERRITIN
Ferritin: 41.5 ng/mL (ref 22.0–322.0)
Iron: 28 ug/dL — ABNORMAL LOW (ref 42–165)
Saturation Ratios: 9.5 % — ABNORMAL LOW (ref 20.0–50.0)
TIBC: 295.4 ug/dL (ref 250.0–450.0)
Transferrin: 211 mg/dL — ABNORMAL LOW (ref 212.0–360.0)

## 2024-01-20 LAB — CBC
HCT: 30.6 % — ABNORMAL LOW (ref 39.0–52.0)
Hemoglobin: 10 g/dL — ABNORMAL LOW (ref 13.0–17.0)
MCHC: 32.8 g/dL (ref 30.0–36.0)
MCV: 89 fl (ref 78.0–100.0)
Platelets: 266 10*3/uL (ref 150.0–400.0)
RBC: 3.44 Mil/uL — ABNORMAL LOW (ref 4.22–5.81)
RDW: 17.7 % — ABNORMAL HIGH (ref 11.5–15.5)
WBC: 5.3 10*3/uL (ref 4.0–10.5)

## 2024-01-20 MED ORDER — FERROUS SULFATE 325 (65 FE) MG PO TBEC: 1.0000 | DELAYED_RELEASE_TABLET | Freq: Every day | ORAL | Status: AC

## 2024-01-27 ENCOUNTER — Other Ambulatory Visit (INDEPENDENT_AMBULATORY_CARE_PROVIDER_SITE_OTHER)

## 2024-01-27 DIAGNOSIS — E611 Iron deficiency: Secondary | ICD-10-CM | POA: Diagnosis not present

## 2024-01-28 LAB — URINALYSIS, MICROSCOPIC ONLY: RBC / HPF: NONE SEEN (ref 0–?)

## 2024-01-29 LAB — FECAL OCCULT BLOOD, IMMUNOCHEMICAL: Fecal Occult Bld: NEGATIVE

## 2024-02-27 ENCOUNTER — Other Ambulatory Visit: Payer: Self-pay

## 2024-02-27 DIAGNOSIS — R011 Cardiac murmur, unspecified: Secondary | ICD-10-CM

## 2024-02-27 NOTE — Progress Notes (Signed)
 Pt in office with his wife. Pt c/o dizziness when he stands up. Blood pressure in office is 110/62.  Order for echo placed per Dr. Barclay Leyden request. Pt instructed to increase salt in his diet due to low blood pressure.

## 2024-02-27 NOTE — Progress Notes (Signed)
 Medication list updated per pt's representative present with pt and his wife today.

## 2024-03-23 ENCOUNTER — Encounter: Payer: Self-pay | Admitting: Family Medicine

## 2024-03-23 ENCOUNTER — Ambulatory Visit: Admitting: Family Medicine

## 2024-03-23 VITALS — BP 128/64 | HR 63 | Temp 98.0°F | Resp 16 | Ht 68.0 in | Wt 142.8 lb

## 2024-03-23 DIAGNOSIS — R351 Nocturia: Secondary | ICD-10-CM | POA: Diagnosis not present

## 2024-03-23 DIAGNOSIS — T50905A Adverse effect of unspecified drugs, medicaments and biological substances, initial encounter: Secondary | ICD-10-CM

## 2024-03-23 DIAGNOSIS — N401 Enlarged prostate with lower urinary tract symptoms: Secondary | ICD-10-CM

## 2024-03-23 DIAGNOSIS — H8112 Benign paroxysmal vertigo, left ear: Secondary | ICD-10-CM | POA: Diagnosis not present

## 2024-03-23 MED ORDER — FINASTERIDE 5 MG PO TABS: 5.0000 mg | ORAL_TABLET | Freq: Every day | ORAL | 1 refills | Status: DC

## 2024-03-23 NOTE — Progress Notes (Signed)
 Chief Complaint  Patient presents with   Follow-up    Folllow Up Dizziness    Dizziness    Oscar [name] is 89 y.o. pt here for dizziness. Here w aide who helps w hx.   Duration: 2 months Pass out? No Spinning? Yes Recent illness/fever? No Headache? No Neurologic signs? No Change in PO intake? No Could do better w hydration.  Palpitations? No He is on Flomax  for BPH.   Past Medical History:  Diagnosis Date   Abnormal nuclear stress test    Abnormal results of thyroid  function studies 01/19/2014   Formatting of this note might be different from the original. August 2016  TSH 4.01   Apneic episode 11/29/2020   Ascending aorta dilatation (HCC) 12/07/2020   Atrial tachycardia, paroxysmal (HCC) 08/05/2019   Bunion of great toe of right foot 09/18/2016   Centrilobular emphysema (HCC) 11/26/2020   Cervical stenosis of spine    CHB (complete heart block) (HCC) 09/28/2020   Coronary artery disease involving native coronary artery of native heart without angina pectoris 08/05/2019   CVA (cerebral vascular accident) (HCC) 03/12/2020   Encounter for general adult medical examination without abnormal findings 05/20/2016   Essential hypertension 03/12/2020   Exertional dyspnea 01/18/2020   Gastro-esophageal reflux disease with esophagitis 09/28/2010   GERD (gastroesophageal reflux disease)    History of bladder cancer 03/14/2014   History of concussion    X2   NO RESIDUALS   History of peptic ulcer 1970'S   Hypercholesterolemia    Hypothyroidism due to Hashimoto's thyroiditis 07/23/2019   LBBB (left bundle branch block)    Macular degeneration of left eye    Mixed hyperlipidemia 11/24/2013   Formatting of this note might be different from the original. History of TIA.  The goal is to have your total cholesterol < 200, the HDL (good cholesterol) >40, and the LDL (bad cholesterol) <100. It is recomended that you follow a good low fat diet and exercise for 30 minutes 3-4 times a  week.   Personal history of tobacco use, presenting hazards to health 01/12/2014   Presence of permanent cardiac pacemaker 11/15/2020   S/P bilateral cataract extraction 09/18/2016   Serum reaction due to vaccination 09/27/2011   Formatting of this note might be different from the original. IMPRESSION: seen on Wednesday for reaction to right arm after pneumovax, had redness, warmth , tenderness and edema of right upper arm which is now resolved. monitor area. use warm compresses prn. call prn. discussed will not need another pneumovax, he will consider zostavax in future.   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   Status post placement of implantable loop recorder 11/08/2014   TIA (transient ischemic attack) 04/09/2014    BP 128/64 (BP Location: Left Arm, Patient Position: Sitting)   Pulse 63   Temp 98 F (36.7 C) (Oral)   Resp 16   Ht 5' 8 (1.727 m)   Wt 142 lb 12.8 oz (64.8 kg)   SpO2 98%   BMI 21.71 kg/m  General: Awake, alert, appears stated age Eyes: PERRLA, EOMi Ears: Patent, TM's neg b/l Heart: RRR, no murmurs, no carotid bruits Lungs: CTAB, no accessory muscle use MSK: 5/5 strength throughout, gait normal Neuro: No cerebellar signs, patellar reflex 1/4 b/l wo clonus, calcaneal reflex 0/4 b/l wo clonus, biceps reflex 1/4 b/l wo clonus; Dix-Hall-Pike negative on R, + on L. Psych: Age appropriate judgment and insight, normal mood and affect  Benign paroxysmal positional vertigo of left ear - Plan:  Ambulatory referral to Physical Therapy  Adverse effect of drug, initial encounter  BPH associated with nocturia - Plan: finasteride (PROSCAR) 5 MG tablet, Ambulatory referral to Physical Therapy, CANCELED: Ambulatory referral to Physical Therapy, CANCELED: Ambulatory referral to Physical Therapy, CANCELED: Ambulatory referral to Physical Therapy  We will get him set up with vestibular rehab/physical therapy at his assisted living facility.  Order printed  today. Sounds like Flomax  could be contribute to orthostasis.  We will stop this. Stop Flomax , start finasteride 5 mg daily.  Follow-up in 1 month to recheck this. Pt and his aide voiced understanding and agreement to the plan.  Mabel [place], DO 03/23/24 12:36 PM

## 2024-03-23 NOTE — Patient Instructions (Addendum)
 Stay hydrated.  Get up slowly.   Let us  know if you need anything.

## 2024-03-27 ENCOUNTER — Inpatient Hospital Stay (HOSPITAL_BASED_OUTPATIENT_CLINIC_OR_DEPARTMENT_OTHER)
Admission: EM | Admit: 2024-03-27 | Discharge: 2024-03-31 | DRG: 481 | Disposition: A | Discharge status: Home Health | Source: Other Acute Inpatient Hospital | Attending: Family Medicine | Admitting: Family Medicine

## 2024-03-27 ENCOUNTER — Emergency Department (HOSPITAL_BASED_OUTPATIENT_CLINIC_OR_DEPARTMENT_OTHER)

## 2024-03-27 ENCOUNTER — Encounter (HOSPITAL_BASED_OUTPATIENT_CLINIC_OR_DEPARTMENT_OTHER): Payer: Self-pay | Admitting: Emergency Medicine

## 2024-03-27 ENCOUNTER — Other Ambulatory Visit: Payer: Self-pay

## 2024-03-27 DIAGNOSIS — E782 Mixed hyperlipidemia: Secondary | ICD-10-CM | POA: Diagnosis present

## 2024-03-27 DIAGNOSIS — S72009A Fracture of unspecified part of neck of unspecified femur, initial encounter for closed fracture: Secondary | ICD-10-CM | POA: Diagnosis not present

## 2024-03-27 DIAGNOSIS — Z8673 Personal history of transient ischemic attack (TIA), and cerebral infarction without residual deficits: Secondary | ICD-10-CM

## 2024-03-27 DIAGNOSIS — E063 Autoimmune thyroiditis: Secondary | ICD-10-CM | POA: Diagnosis present

## 2024-03-27 DIAGNOSIS — M810 Age-related osteoporosis without current pathological fracture: Secondary | ICD-10-CM | POA: Diagnosis present

## 2024-03-27 DIAGNOSIS — Z1152 Encounter for screening for COVID-19: Secondary | ICD-10-CM

## 2024-03-27 DIAGNOSIS — S72012A Unspecified intracapsular fracture of left femur, initial encounter for closed fracture: Secondary | ICD-10-CM | POA: Diagnosis not present

## 2024-03-27 DIAGNOSIS — Z8249 Family history of ischemic heart disease and other diseases of the circulatory system: Secondary | ICD-10-CM

## 2024-03-27 DIAGNOSIS — Z950 Presence of cardiac pacemaker: Secondary | ICD-10-CM

## 2024-03-27 DIAGNOSIS — H3530 Unspecified macular degeneration: Secondary | ICD-10-CM | POA: Diagnosis present

## 2024-03-27 DIAGNOSIS — Z8711 Personal history of peptic ulcer disease: Secondary | ICD-10-CM

## 2024-03-27 DIAGNOSIS — W1830XA Fall on same level, unspecified, initial encounter: Secondary | ICD-10-CM | POA: Diagnosis present

## 2024-03-27 DIAGNOSIS — I5032 Chronic diastolic (congestive) heart failure: Secondary | ICD-10-CM | POA: Diagnosis present

## 2024-03-27 DIAGNOSIS — Z981 Arthrodesis status: Secondary | ICD-10-CM

## 2024-03-27 DIAGNOSIS — Z9842 Cataract extraction status, left eye: Secondary | ICD-10-CM

## 2024-03-27 DIAGNOSIS — R42 Dizziness and giddiness: Secondary | ICD-10-CM | POA: Diagnosis present

## 2024-03-27 DIAGNOSIS — Z7982 Long term (current) use of aspirin: Secondary | ICD-10-CM

## 2024-03-27 DIAGNOSIS — J432 Centrilobular emphysema: Secondary | ICD-10-CM | POA: Diagnosis present

## 2024-03-27 DIAGNOSIS — Z833 Family history of diabetes mellitus: Secondary | ICD-10-CM

## 2024-03-27 DIAGNOSIS — S72002A Fracture of unspecified part of neck of left femur, initial encounter for closed fracture: Secondary | ICD-10-CM | POA: Diagnosis present

## 2024-03-27 DIAGNOSIS — Z79899 Other long term (current) drug therapy: Secondary | ICD-10-CM

## 2024-03-27 DIAGNOSIS — N400 Enlarged prostate without lower urinary tract symptoms: Secondary | ICD-10-CM | POA: Diagnosis present

## 2024-03-27 DIAGNOSIS — K219 Gastro-esophageal reflux disease without esophagitis: Secondary | ICD-10-CM | POA: Diagnosis present

## 2024-03-27 DIAGNOSIS — Z9049 Acquired absence of other specified parts of digestive tract: Secondary | ICD-10-CM

## 2024-03-27 DIAGNOSIS — I2510 Atherosclerotic heart disease of native coronary artery without angina pectoris: Secondary | ICD-10-CM | POA: Diagnosis present

## 2024-03-27 DIAGNOSIS — Z9841 Cataract extraction status, right eye: Secondary | ICD-10-CM

## 2024-03-27 DIAGNOSIS — I110 Hypertensive heart disease with heart failure: Secondary | ICD-10-CM | POA: Diagnosis present

## 2024-03-27 DIAGNOSIS — Z87891 Personal history of nicotine dependence: Secondary | ICD-10-CM

## 2024-03-27 DIAGNOSIS — I951 Orthostatic hypotension: Secondary | ICD-10-CM | POA: Diagnosis present

## 2024-03-27 DIAGNOSIS — Z823 Family history of stroke: Secondary | ICD-10-CM

## 2024-03-27 DIAGNOSIS — M21611 Bunion of right foot: Secondary | ICD-10-CM | POA: Diagnosis present

## 2024-03-27 DIAGNOSIS — Z8551 Personal history of malignant neoplasm of bladder: Secondary | ICD-10-CM

## 2024-03-27 LAB — CBC
HCT: 38.1 % — ABNORMAL LOW (ref 39.0–52.0)
Hemoglobin: 12.1 g/dL — ABNORMAL LOW (ref 13.0–17.0)
MCH: 28.1 pg (ref 26.0–34.0)
MCHC: 31.8 g/dL (ref 30.0–36.0)
MCV: 88.4 fL (ref 80.0–100.0)
Platelets: 178 K/uL (ref 150–400)
RBC: 4.31 MIL/uL (ref 4.22–5.81)
RDW: 15.9 % — ABNORMAL HIGH (ref 11.5–15.5)
WBC: 7.6 K/uL (ref 4.0–10.5)
nRBC: 0 % (ref 0.0–0.2)

## 2024-03-27 LAB — COMPREHENSIVE METABOLIC PANEL WITH GFR
ALT: 13 U/L (ref 0–44)
AST: 18 U/L (ref 15–41)
Albumin: 4 g/dL (ref 3.5–5.0)
Alkaline Phosphatase: 109 U/L (ref 38–126)
Anion gap: 13 (ref 5–15)
BUN: 23 mg/dL (ref 8–23)
CO2: 22 mmol/L (ref 22–32)
Calcium: 9.7 mg/dL (ref 8.9–10.3)
Chloride: 103 mmol/L (ref 98–111)
Creatinine, Ser: 1.03 mg/dL (ref 0.61–1.24)
GFR, Estimated: >60 mL/min (ref >60)
Glucose, Bld: 103 mg/dL — ABNORMAL HIGH (ref 70–99)
Potassium: 4.4 mmol/L (ref 3.5–5.1)
Sodium: 138 mmol/L (ref 135–145)
Total Bilirubin: 0.3 mg/dL (ref 0.0–1.2)
Total Protein: 6.9 g/dL (ref 6.5–8.1)

## 2024-03-27 MED ORDER — ONDANSETRON HCL 4 MG/2ML IJ SOLN: 4.0000 mg | Freq: Once | INTRAMUSCULAR | Status: DC

## 2024-03-27 MED ORDER — FENTANYL CITRATE PF 50 MCG/ML IJ SOSY: 25.0000 ug | PREFILLED_SYRINGE | Freq: Once | INTRAMUSCULAR | Status: DC

## 2024-03-27 NOTE — Plan of Care (Signed)
 Plan of Care Note for accepted transfer   Patient name: Oscar [name] FMW:[id number] DOB: 20-Jan-1931  Facility requesting transfer: [facility] Requesting Provider: Odell Balls, PA-C  Facility course: 89 year old male with past medical history of HFpEF, CVA/TIA, CHB status post pacemaker implant, CAD, hypothyroidism, hyperlipidemia, hypertension, orthostatic hypotension, chronic anemia, BPH, BPPV presented to the ED after he felt dizzy this morning while trying to get out of bed and fell forward onto the floor.  Complained of left hip pain.  Vital signs stable on arrival.  No acute lab abnormalities on CBC and CMP.  Has chronic anemia and hemoglobin stable.  CT of left hip showing transverse fracture of the subcapital left femoral neck with impaction and mild valgus angulation but no intertrochanteric involvement.  Also showing small anterior intramuscular hematoma.  Chest x-ray showing no active disease.  CT head/C-spine negative for acute findings.  EDP discussed the case with Dr. Edna who requested admission by medicine service at [facility] and orthopedics planning on taking the patient to the OR in the morning.  Plan of care: The patient is accepted for admission to Telemetry unit at [facility].  [facility] will assume care on arrival to accepting facility. Until arrival, care as per EDP. However, [facility] available 24/7 for questions and assistance.  Check [email or web address] for on-call coverage.  Nursing staff, please call [facility] Admits & Consults System-Wide number under Amion on patient's arrival so appropriate admitting provider can evaluate the pt.

## 2024-03-27 NOTE — ED Notes (Signed)
 Pt was seen at primary on Friday and dx with vertigo per caregiver at bedside

## 2024-03-27 NOTE — ED Triage Notes (Signed)
 Pt had a dizzy spell this am while trying to get oob ,fell forward onto floor. No LOC, left elbow and left hand with bruises/scrapes.  Pt has chronic lower back pain, wears an abdominal binder. His right hip also hurts from a fracture earlier in the year. He has been having dizzy spells but no diagnosis

## 2024-03-27 NOTE — ED Provider Notes (Signed)
  Physical Exam  BP (!) 142/78 (BP Location: Right Arm)   Pulse 76   Temp 99.1 F (37.3 C) (Oral)   Resp 16   SpO2 98%   Physical Exam  Procedures  Procedures  ED Course / MDM   Clinical Course as of 03/27/24 2010  Sat Mar 27, 2024  1907 Case discussed with Dr. Edna.  Patient with acute left subcapital femoral neck fracture.  He recommends admission to medicine service at [facility]. [AH]    Clinical Course User Index [AH] Arloa Chroman, PA-C   Medical Decision Making Amount and/or Complexity of Data Reviewed Labs: ordered. Radiology: ordered.  Risk Prescription drug management. Decision regarding hospitalization.   Acute supcapital fracture, left Heart block s/p pacemaker [place]y - surgery tomorrow Admit to [facility]  Discussed with the hospitalist who accepts for admission.       Odell Balls, PA-C 03/27/24 2010    Patsey Lot, MD 03/27/24 [phone]

## 2024-03-27 NOTE — ED Notes (Signed)
 Fall risk bundle is in place

## 2024-03-27 NOTE — ED Provider Notes (Signed)
 [place] EMERGENCY DEPARTMENT AT [facility] Provider Note   CSN: [id number] Arrival date & time: 03/27/24  8447     Patient presents with: No chief complaint on file.   Oscar [name] is a 89 y.o. male who presents emergency department with a chief complaint of fall and hip pain.  Patient reports that he was recently diagnosed with BPPV by his primary care physician.  Patient states that he stood up in the [place] around 3 AM to go the bathroom and had a dizzy spell falling to the ground.  He lives in an independent living facility and had some folks help him get off the ground.  Patient reports that he did try walking on his walker but he had severe pain in his hip and other residents at his facility and encouraged him to be seen in the emergency department.  He denies hitting his head or losing consciousness he also denies taking blood thinners.  {Add pertinent medical, surgical, social history, OB history to HPI:32947} HPI     Prior to Admission medications   Medication Sig Start Date End Date Taking? Authorizing Provider  acetaminophen  (TYLENOL ) 500 MG tablet Take 2 tablets (1,000 mg total) by mouth every 8 (eight) hours. 12/17/23   ShahmehdiAdriana LABOR, MD  aspirin  EC 325 MG tablet Take 1 tablet (325 mg total) by mouth daily. 12/15/23   Danton Lauraine LABOR, PA-C  atorvastatin  (LIPITOR) 40 MG tablet Take 1 tablet (40 mg total) by mouth daily. Patient not taking: Reported on 12/12/2023 11/27/20   Revankar, Jennifer SAUNDERS, MD  Cholecalciferol  125 MCG (5000 UT) TABS Take 1 tablet by mouth daily.    [provider]  ferrous sulfate  325 (65 FE) MG EC tablet Take 1 tablet (325 mg total) by mouth daily with breakfast. 01/20/24   Wendling, Mabel Mt, DO  finasteride  (PROSCAR ) 5 MG tablet Take 1 tablet (5 mg total) by mouth daily. 03/23/24   Frann Mabel Mt, DO  Multiple Vitamin (MULTIVITAMIN WITH MINERALS) TABS tablet Take 1 tablet by mouth daily. 12/17/23   Shahmehdi, Adriana LABOR,  MD  pantoprazole  (PROTONIX ) 40 MG tablet Take 1 tablet (40 mg total) by mouth daily. 11/19/23   Frann Mabel Mt, DO    Allergies: Patient has no known allergies.    Review of Systems  Updated Vital Signs BP 125/68 (BP Location: Left Arm)   Pulse 78   Temp (!) 97.5 F (36.4 C)   Resp 17   SpO2 98%   Physical Exam Vitals and nursing note reviewed.  Constitutional:      General: He is not in acute distress.    Appearance: He is well-developed. He is not diaphoretic.  HENT:     Head: Normocephalic and atraumatic.  Eyes:     General: No scleral icterus.    Conjunctiva/sclera: Conjunctivae normal.  Cardiovascular:     Rate and Rhythm: Normal rate and regular rhythm.     Heart sounds: Normal heart sounds.  Pulmonary:     Effort: Pulmonary effort is normal. No respiratory distress.     Breath sounds: Normal breath sounds.  Abdominal:     Palpations: Abdomen is soft.     Tenderness: There is no abdominal tenderness.  Musculoskeletal:     Cervical back: Normal range of motion and neck supple.     Comments: No midline spinal fracture, no obvious head trauma.  No pain to palpation of the left knee or ankle, full range of motion.  Some mild pain  externally in the hip with internal rotation.  No pain to pressure on the pelvic joints.  Skin:    General: Skin is warm and dry.  Neurological:     Mental Status: He is alert.  Psychiatric:        Behavior: Behavior normal.     (all labs ordered are listed, but only abnormal results are displayed) Labs Reviewed  CBC - Abnormal; Notable for the following components:      Result Value   Hemoglobin 12.1 (*)    HCT 38.1 (*)    RDW 15.9 (*)    All other components within normal limits  COMPREHENSIVE METABOLIC PANEL WITH GFR    EKG: None  Radiology: No results found.  {Document cardiac monitor, telemetry assessment procedure when appropriate:32947} Procedures   Medications Ordered in the ED - No data to  display  Clinical Course as of 03/27/24 1924  Sat Mar 27, 2024  1907 Case discussed with Dr. Edna.  Patient with acute left subcapital femoral neck fracture.  He recommends admission to medicine service at [facility]. [AH]    Clinical Course User Index [AH] Commodore Bellew, PA-C   {Click here for ABCD2, HEART and other calculators REFRESH Note before signing:1}                              Medical Decision Making Amount and/or Complexity of Data Reviewed Labs: ordered. Radiology: ordered.  Risk Prescription drug management. Decision regarding hospitalization.   This is a 89 year old gentleman with mechanical fall after an episode of vertigo vertigo causing him to fall. I ordered images including CT head and C-spine which show no acute findings.  I ordered a left hip x-ray which shows a subcapital femoral neck fracture. I ordered labs which showed no acute findings, normal renal function.  I ordered preop chest x-ray which shows no acute findings as well. Visualized and interpreted all images ordered.  I discussed the case with Dr. Edna orthopedics who asked that the patient remain n.p.o. after midnight they will plan for operative repair tomorrow and has added on a CT of the hip.  Patient is having intermittent episodes of spasm I have ordered some pain medication.  I discussed all findings with the patient and his family member at bedside.  {Document critical care time when appropriate  Document review of labs and clinical decision tools ie [id number], etc  Document your independent review of radiology images and any outside records  Document your discussion with family members, caretakers and with consultants  Document social determinants of health affecting pt's care  Document your decision making why or why not admission, treatments were needed:32947:::1}   Final diagnoses:  Closed subcapital fracture of left femur, initial encounter [facility])    ED Discharge Orders      None

## 2024-03-28 ENCOUNTER — Inpatient Hospital Stay (HOSPITAL_COMMUNITY): Admitting: Anesthesiology

## 2024-03-28 ENCOUNTER — Other Ambulatory Visit: Payer: Self-pay

## 2024-03-28 ENCOUNTER — Inpatient Hospital Stay (HOSPITAL_COMMUNITY)

## 2024-03-28 ENCOUNTER — Inpatient Hospital Stay (HOSPITAL_COMMUNITY): Admission: EM | Disposition: A | Discharge status: Home Health | Payer: Self-pay | Attending: Internal Medicine

## 2024-03-28 ENCOUNTER — Encounter (HOSPITAL_COMMUNITY): Payer: Self-pay

## 2024-03-28 DIAGNOSIS — S72002A Fracture of unspecified part of neck of left femur, initial encounter for closed fracture: Secondary | ICD-10-CM

## 2024-03-28 DIAGNOSIS — I5032 Chronic diastolic (congestive) heart failure: Secondary | ICD-10-CM | POA: Diagnosis present

## 2024-03-28 DIAGNOSIS — N400 Enlarged prostate without lower urinary tract symptoms: Secondary | ICD-10-CM | POA: Diagnosis present

## 2024-03-28 DIAGNOSIS — Z833 Family history of diabetes mellitus: Secondary | ICD-10-CM | POA: Diagnosis not present

## 2024-03-28 DIAGNOSIS — H3530 Unspecified macular degeneration: Secondary | ICD-10-CM | POA: Diagnosis present

## 2024-03-28 DIAGNOSIS — E039 Hypothyroidism, unspecified: Secondary | ICD-10-CM | POA: Diagnosis not present

## 2024-03-28 DIAGNOSIS — I2510 Atherosclerotic heart disease of native coronary artery without angina pectoris: Secondary | ICD-10-CM

## 2024-03-28 DIAGNOSIS — Z9841 Cataract extraction status, right eye: Secondary | ICD-10-CM | POA: Diagnosis not present

## 2024-03-28 DIAGNOSIS — Z1152 Encounter for screening for COVID-19: Secondary | ICD-10-CM | POA: Diagnosis not present

## 2024-03-28 DIAGNOSIS — Z8673 Personal history of transient ischemic attack (TIA), and cerebral infarction without residual deficits: Secondary | ICD-10-CM | POA: Diagnosis not present

## 2024-03-28 DIAGNOSIS — W1830XA Fall on same level, unspecified, initial encounter: Secondary | ICD-10-CM

## 2024-03-28 DIAGNOSIS — J432 Centrilobular emphysema: Secondary | ICD-10-CM | POA: Diagnosis present

## 2024-03-28 DIAGNOSIS — Z950 Presence of cardiac pacemaker: Secondary | ICD-10-CM | POA: Diagnosis not present

## 2024-03-28 DIAGNOSIS — E785 Hyperlipidemia, unspecified: Secondary | ICD-10-CM | POA: Diagnosis not present

## 2024-03-28 DIAGNOSIS — Z87891 Personal history of nicotine dependence: Secondary | ICD-10-CM | POA: Diagnosis not present

## 2024-03-28 DIAGNOSIS — S72009A Fracture of unspecified part of neck of unspecified femur, initial encounter for closed fracture: Secondary | ICD-10-CM | POA: Diagnosis present

## 2024-03-28 DIAGNOSIS — Z823 Family history of stroke: Secondary | ICD-10-CM | POA: Diagnosis not present

## 2024-03-28 DIAGNOSIS — Z8551 Personal history of malignant neoplasm of bladder: Secondary | ICD-10-CM | POA: Diagnosis not present

## 2024-03-28 DIAGNOSIS — Z8249 Family history of ischemic heart disease and other diseases of the circulatory system: Secondary | ICD-10-CM | POA: Diagnosis not present

## 2024-03-28 DIAGNOSIS — M810 Age-related osteoporosis without current pathological fracture: Secondary | ICD-10-CM | POA: Diagnosis present

## 2024-03-28 DIAGNOSIS — I110 Hypertensive heart disease with heart failure: Secondary | ICD-10-CM | POA: Diagnosis present

## 2024-03-28 DIAGNOSIS — Z7982 Long term (current) use of aspirin: Secondary | ICD-10-CM | POA: Diagnosis not present

## 2024-03-28 DIAGNOSIS — K219 Gastro-esophageal reflux disease without esophagitis: Secondary | ICD-10-CM | POA: Diagnosis present

## 2024-03-28 DIAGNOSIS — I951 Orthostatic hypotension: Secondary | ICD-10-CM | POA: Diagnosis present

## 2024-03-28 DIAGNOSIS — J449 Chronic obstructive pulmonary disease, unspecified: Secondary | ICD-10-CM

## 2024-03-28 DIAGNOSIS — E782 Mixed hyperlipidemia: Secondary | ICD-10-CM | POA: Diagnosis present

## 2024-03-28 DIAGNOSIS — R55 Syncope and collapse: Secondary | ICD-10-CM | POA: Diagnosis present

## 2024-03-28 DIAGNOSIS — R42 Dizziness and giddiness: Secondary | ICD-10-CM | POA: Diagnosis present

## 2024-03-28 DIAGNOSIS — R410 Disorientation, unspecified: Secondary | ICD-10-CM | POA: Diagnosis not present

## 2024-03-28 DIAGNOSIS — W19XXXA Unspecified fall, initial encounter: Secondary | ICD-10-CM | POA: Diagnosis not present

## 2024-03-28 DIAGNOSIS — R2689 Other abnormalities of gait and mobility: Secondary | ICD-10-CM | POA: Diagnosis not present

## 2024-03-28 DIAGNOSIS — M21611 Bunion of right foot: Secondary | ICD-10-CM | POA: Diagnosis present

## 2024-03-28 DIAGNOSIS — S72012A Unspecified intracapsular fracture of left femur, initial encounter for closed fracture: Secondary | ICD-10-CM | POA: Diagnosis present

## 2024-03-28 DIAGNOSIS — G9340 Encephalopathy, unspecified: Secondary | ICD-10-CM | POA: Diagnosis not present

## 2024-03-28 DIAGNOSIS — Z8711 Personal history of peptic ulcer disease: Secondary | ICD-10-CM | POA: Diagnosis not present

## 2024-03-28 DIAGNOSIS — E063 Autoimmune thyroiditis: Secondary | ICD-10-CM | POA: Diagnosis present

## 2024-03-28 HISTORY — DX: Fall on same level, unspecified, initial encounter: W18.30XA

## 2024-03-28 HISTORY — PX: HIP PINNING,CANNULATED: SHX1758

## 2024-03-28 LAB — BASIC METABOLIC PANEL WITH GFR
Anion gap: 12 (ref 5–15)
BUN: 16 mg/dL (ref 8–23)
CO2: 18 mmol/L — ABNORMAL LOW (ref 22–32)
Calcium: 8.8 mg/dL — ABNORMAL LOW (ref 8.9–10.3)
Chloride: 107 mmol/L (ref 98–111)
Creatinine, Ser: 1.04 mg/dL (ref 0.61–1.24)
GFR, Estimated: >60 mL/min (ref >60)
Glucose, Bld: 83 mg/dL (ref 70–99)
Potassium: 4.1 mmol/L (ref 3.5–5.1)
Sodium: 137 mmol/L (ref 135–145)

## 2024-03-28 LAB — MAGNESIUM: Magnesium: 2 mg/dL (ref 1.7–2.4)

## 2024-03-28 LAB — CBC
HCT: 35.1 % — ABNORMAL LOW (ref 39.0–52.0)
Hemoglobin: 10.7 g/dL — ABNORMAL LOW (ref 13.0–17.0)
MCH: 28 pg (ref 26.0–34.0)
MCHC: 30.5 g/dL (ref 30.0–36.0)
MCV: 91.9 fL (ref 80.0–100.0)
Platelets: 126 K/uL — ABNORMAL LOW (ref 150–400)
RBC: 3.82 MIL/uL — ABNORMAL LOW (ref 4.22–5.81)
RDW: 15.9 % — ABNORMAL HIGH (ref 11.5–15.5)
WBC: 5.7 K/uL (ref 4.0–10.5)
nRBC: 0 % (ref 0.0–0.2)

## 2024-03-28 LAB — PHOSPHORUS: Phosphorus: 3.7 mg/dL (ref 2.5–4.6)

## 2024-03-28 LAB — VITAMIN D 25 HYDROXY (VIT D DEFICIENCY, FRACTURES): Vit D, 25-Hydroxy: 48.28 ng/mL (ref 30–100)

## 2024-03-28 LAB — SURGICAL PCR SCREEN
MRSA, PCR: NEGATIVE
Staphylococcus aureus: NEGATIVE

## 2024-03-28 LAB — PROTIME-INR
INR: 1.1 (ref 0.8–1.2)
Prothrombin Time: 14.9 s (ref 11.4–15.2)

## 2024-03-28 SURGERY — FIXATION, FEMUR, NECK, PERCUTANEOUS, USING SCREW
Anesthesia: General | Laterality: Left

## 2024-03-28 MED ORDER — FENTANYL CITRATE (PF) 250 MCG/5ML IJ SOLN
INTRAMUSCULAR | Status: DC | PRN
  Administered 2024-03-28: 100 ug via INTRAVENOUS

## 2024-03-28 MED ORDER — PHENYLEPHRINE 80 MCG/ML (10ML) SYRINGE FOR IV PUSH (FOR BLOOD PRESSURE SUPPORT)
PREFILLED_SYRINGE | INTRAVENOUS | Status: DC | PRN
  Administered 2024-03-28 (×2): 80 ug via INTRAVENOUS
  Administered 2024-03-28: 160 ug via INTRAVENOUS

## 2024-03-28 MED ORDER — ROCURONIUM BROMIDE 10 MG/ML (PF) SYRINGE
PREFILLED_SYRINGE | INTRAVENOUS | Status: DC | PRN
  Administered 2024-03-28: 50 mg via INTRAVENOUS

## 2024-03-28 MED ORDER — SODIUM CHLORIDE 0.9% FLUSH
3.0000 mL | Freq: Two times a day (BID) | INTRAVENOUS | Status: DC
  Administered 2024-03-28 – 2024-03-31 (×6): 3 mL via INTRAVENOUS

## 2024-03-28 MED ORDER — CHLORHEXIDINE GLUCONATE 4 % EX SOLN: 60.0000 mL | Freq: Once | CUTANEOUS | Status: DC

## 2024-03-28 MED ORDER — DEXAMETHASONE SODIUM PHOSPHATE 10 MG/ML IJ SOLN
INTRAMUSCULAR | Status: DC | PRN
  Administered 2024-03-28: 10 mg via INTRAVENOUS

## 2024-03-28 MED ORDER — ONDANSETRON HCL 4 MG/2ML IJ SOLN: 4.0000 mg | Freq: Four times a day (QID) | INTRAMUSCULAR | Status: DC | PRN

## 2024-03-28 MED ORDER — OXYCODONE HCL 5 MG PO TABS: 5.0000 mg | ORAL_TABLET | Freq: Once | ORAL | Status: DC | PRN

## 2024-03-28 MED ORDER — ONDANSETRON HCL 4 MG/2ML IJ SOLN
INTRAMUSCULAR | Status: AC
  Filled 2024-03-28: qty 2

## 2024-03-28 MED ORDER — CEFAZOLIN SODIUM-DEXTROSE 2-4 GM/100ML-% IV SOLN
2.0000 g | Freq: Three times a day (TID) | INTRAVENOUS | Status: AC
  Administered 2024-03-28 – 2024-03-29 (×2): 2 g via INTRAVENOUS
  Filled 2024-03-28 (×2): qty 100

## 2024-03-28 MED ORDER — SUGAMMADEX SODIUM 200 MG/2ML IV SOLN
INTRAVENOUS | Status: DC | PRN
  Administered 2024-03-28: 200 mg via INTRAVENOUS

## 2024-03-28 MED ORDER — LACTATED RINGERS IV SOLN: INTRAVENOUS | Status: DC

## 2024-03-28 MED ORDER — FENTANYL CITRATE (PF) 100 MCG/2ML IJ SOLN: 25.0000 ug | INTRAMUSCULAR | Status: DC | PRN

## 2024-03-28 MED ORDER — ACETAMINOPHEN 500 MG PO TABS
1000.0000 mg | ORAL_TABLET | Freq: Four times a day (QID) | ORAL | Status: DC | PRN
  Administered 2024-03-29 – 2024-03-30 (×2): 1000 mg via ORAL
  Filled 2024-03-28 (×2): qty 2

## 2024-03-28 MED ORDER — VANCOMYCIN HCL 500 MG IV SOLR
INTRAVENOUS | Status: AC
  Filled 2024-03-28: qty 10

## 2024-03-28 MED ORDER — POLYETHYLENE GLYCOL 3350 17 G PO PACK
17.0000 g | PACK | Freq: Two times a day (BID) | ORAL | Status: AC
  Administered 2024-03-28 – 2024-03-29 (×3): 17 g via ORAL
  Filled 2024-03-28 (×3): qty 1

## 2024-03-28 MED ORDER — CEFAZOLIN SODIUM-DEXTROSE 2-4 GM/100ML-% IV SOLN
2.0000 g | INTRAVENOUS | Status: AC
  Administered 2024-03-28: 2 g via INTRAVENOUS

## 2024-03-28 MED ORDER — PANTOPRAZOLE SODIUM 40 MG PO TBEC
40.0000 mg | DELAYED_RELEASE_TABLET | Freq: Every day | ORAL | Status: DC
  Administered 2024-03-29 – 2024-03-31 (×3): 40 mg via ORAL
  Filled 2024-03-28 (×3): qty 1

## 2024-03-28 MED ORDER — HYDROMORPHONE HCL 1 MG/ML IJ SOLN: 0.5000 mg | INTRAMUSCULAR | Status: DC | PRN

## 2024-03-28 MED ORDER — OXYCODONE HCL 5 MG/5ML PO SOLN: 5.0000 mg | Freq: Once | ORAL | Status: DC | PRN

## 2024-03-28 MED ORDER — ONDANSETRON HCL 4 MG/2ML IJ SOLN
INTRAMUSCULAR | Status: DC | PRN
  Administered 2024-03-28: 4 mg via INTRAVENOUS

## 2024-03-28 MED ORDER — OXYCODONE HCL 5 MG PO TABS
5.0000 mg | ORAL_TABLET | ORAL | Status: DC | PRN
  Administered 2024-03-28: 5 mg via ORAL
  Filled 2024-03-28: qty 1

## 2024-03-28 MED ORDER — ENOXAPARIN SODIUM 40 MG/0.4ML IJ SOSY
40.0000 mg | PREFILLED_SYRINGE | INTRAMUSCULAR | Status: DC
  Administered 2024-03-29 – 2024-03-31 (×3): 40 mg via SUBCUTANEOUS
  Filled 2024-03-28 (×3): qty 0.4

## 2024-03-28 MED ORDER — SUGAMMADEX SODIUM 200 MG/2ML IV SOLN
INTRAVENOUS | Status: AC
  Filled 2024-03-28: qty 2

## 2024-03-28 MED ORDER — TAMSULOSIN HCL 0.4 MG PO CAPS
0.4000 mg | ORAL_CAPSULE | Freq: Every day | ORAL | Status: DC
  Administered 2024-03-29 – 2024-03-30 (×2): 0.4 mg via ORAL
  Filled 2024-03-28 (×3): qty 1

## 2024-03-28 MED ORDER — TRANEXAMIC ACID-NACL 1000-0.7 MG/100ML-% IV SOLN
INTRAVENOUS | Status: AC
  Filled 2024-03-28: qty 100

## 2024-03-28 MED ORDER — PHENYLEPHRINE 80 MCG/ML (10ML) SYRINGE FOR IV PUSH (FOR BLOOD PRESSURE SUPPORT)
PREFILLED_SYRINGE | INTRAVENOUS | Status: AC
  Filled 2024-03-28: qty 10

## 2024-03-28 MED ORDER — PROPOFOL 10 MG/ML IV BOLUS
INTRAVENOUS | Status: DC | PRN
  Administered 2024-03-28: 90 mg via INTRAVENOUS

## 2024-03-28 MED ORDER — FENTANYL CITRATE (PF) 250 MCG/5ML IJ SOLN
INTRAMUSCULAR | Status: AC
  Filled 2024-03-28: qty 5

## 2024-03-28 MED ORDER — DEXAMETHASONE SODIUM PHOSPHATE 10 MG/ML IJ SOLN
INTRAMUSCULAR | Status: AC
  Filled 2024-03-28: qty 1

## 2024-03-28 MED ORDER — MELATONIN 3 MG PO TABS: 6.0000 mg | ORAL_TABLET | Freq: Every evening | ORAL | Status: DC | PRN

## 2024-03-28 MED ORDER — VANCOMYCIN HCL 1000 MG IV SOLR
INTRAVENOUS | Status: DC | PRN
  Administered 2024-03-28: 500 mg via TOPICAL

## 2024-03-28 MED ORDER — ORAL CARE MOUTH RINSE: 15.0000 mL | Freq: Once | OROMUCOSAL | Status: AC

## 2024-03-28 MED ORDER — ROCURONIUM BROMIDE 10 MG/ML (PF) SYRINGE
PREFILLED_SYRINGE | INTRAVENOUS | Status: AC
Start: 2024-03-28 — End: 2024-03-28
  Filled 2024-03-28: qty 10

## 2024-03-28 MED ORDER — PHENYLEPHRINE HCL-NACL 20-0.9 MG/250ML-% IV SOLN
INTRAVENOUS | Status: DC | PRN
  Administered 2024-03-28: 25 ug/min via INTRAVENOUS

## 2024-03-28 MED ORDER — CEFAZOLIN SODIUM-DEXTROSE 2-4 GM/100ML-% IV SOLN
INTRAVENOUS | Status: AC
  Filled 2024-03-28: qty 100

## 2024-03-28 MED ORDER — VITAMIN D 25 MCG (1000 UNIT) PO TABS
1000.0000 [IU] | ORAL_TABLET | Freq: Every day | ORAL | Status: DC
  Administered 2024-03-29 – 2024-03-31 (×3): 1000 [IU] via ORAL
  Filled 2024-03-28 (×3): qty 1

## 2024-03-28 MED ORDER — POLYETHYLENE GLYCOL 3350 17 G PO PACK: 17.0000 g | PACK | Freq: Every day | ORAL | Status: DC | PRN

## 2024-03-28 MED ORDER — MELATONIN 3 MG PO TABS
6.0000 mg | ORAL_TABLET | Freq: Every day | ORAL | Status: DC
  Administered 2024-03-28 – 2024-03-30 (×3): 6 mg via ORAL
  Filled 2024-03-28 (×3): qty 2

## 2024-03-28 MED ORDER — LIDOCAINE 2% (20 MG/ML) 5 ML SYRINGE
INTRAMUSCULAR | Status: DC | PRN
  Administered 2024-03-28: 40 mg via INTRAVENOUS

## 2024-03-28 MED ORDER — CHLORHEXIDINE GLUCONATE 0.12 % MT SOLN
OROMUCOSAL | Status: AC
  Filled 2024-03-28: qty 15

## 2024-03-28 MED ORDER — 0.9 % SODIUM CHLORIDE (POUR BTL) OPTIME
TOPICAL | Status: DC | PRN
  Administered 2024-03-28: 1000 mL

## 2024-03-28 MED ORDER — ALBUTEROL SULFATE (2.5 MG/3ML) 0.083% IN NEBU: 2.5000 mg | INHALATION_SOLUTION | RESPIRATORY_TRACT | Status: DC | PRN

## 2024-03-28 MED ORDER — PROPOFOL 10 MG/ML IV BOLUS
INTRAVENOUS | Status: AC
  Filled 2024-03-28: qty 20

## 2024-03-28 MED ORDER — POVIDONE-IODINE 10 % EX SWAB
2.0000 | Freq: Once | CUTANEOUS | Status: AC
Start: 2024-03-28 — End: 2024-03-28
  Administered 2024-03-28: 2 via TOPICAL

## 2024-03-28 MED ORDER — LIDOCAINE 2% (20 MG/ML) 5 ML SYRINGE
INTRAMUSCULAR | Status: AC
  Filled 2024-03-28: qty 5

## 2024-03-28 MED ORDER — OXYCODONE HCL 5 MG PO TABS: 2.5000 mg | ORAL_TABLET | ORAL | Status: DC | PRN

## 2024-03-28 MED ORDER — CHLORHEXIDINE GLUCONATE 0.12 % MT SOLN
15.0000 mL | Freq: Once | OROMUCOSAL | Status: AC
  Administered 2024-03-28: 15 mL via OROMUCOSAL

## 2024-03-28 MED ORDER — TRANEXAMIC ACID-NACL 1000-0.7 MG/100ML-% IV SOLN
1000.0000 mg | INTRAVENOUS | Status: AC
  Administered 2024-03-28: 1000 mg via INTRAVENOUS

## 2024-03-28 MED ORDER — STERILE WATER FOR IRRIGATION IR SOLN
Status: DC | PRN
  Administered 2024-03-28: 1000 mL

## 2024-03-28 SURGICAL SUPPLY — 50 items
BAG COUNTER SPONGE SURGICOUNT (BAG) ×2 IMPLANT
BIT DRILL 4.8X300 (BIT) IMPLANT
BNDG COHESIVE 4X5 TAN STRL LF (GAUZE/BANDAGES/DRESSINGS) ×1 IMPLANT
BNDG GAUZE DERMACEA FLUFF 4 (GAUZE/BANDAGES/DRESSINGS) ×2 IMPLANT
BRUSH SCRUB EZ PLAIN DRY (MISCELLANEOUS) ×2 IMPLANT
CANISTER SUCTION 3000ML PPV (SUCTIONS) ×1 IMPLANT
CHLORAPREP W/TINT 26 (MISCELLANEOUS) ×2 IMPLANT
COVER SURGICAL LIGHT HANDLE (MISCELLANEOUS) ×2 IMPLANT
DERMABOND ADVANCED .7 DNX12 (GAUZE/BANDAGES/DRESSINGS) IMPLANT
DRAPE C-ARM 42X72 X-RAY (DRAPES) ×1 IMPLANT
DRAPE C-ARMOR (DRAPES) ×1 IMPLANT
DRAPE STERI IOBAN 125X83 (DRAPES) ×1 IMPLANT
DRAPE U-SHAPE 47X51 STRL (DRAPES) ×3 IMPLANT
DRESSING MEPILEX FLEX 4X4 (GAUZE/BANDAGES/DRESSINGS) IMPLANT
DRSG ADAPTIC 3X8 NADH LF (GAUZE/BANDAGES/DRESSINGS) ×1 IMPLANT
DRSG TEGADERM 4X4.75 (GAUZE/BANDAGES/DRESSINGS) ×1 IMPLANT
ELECT REM PT RETURN 15FT ADLT (MISCELLANEOUS) ×1 IMPLANT
ELECTRODE REM PT RTRN 9FT ADLT (ELECTROSURGICAL) ×1 IMPLANT
GAUZE SPONGE 4X4 12PLY STRL (GAUZE/BANDAGES/DRESSINGS) ×2 IMPLANT
GLOVE BIO SURGEON STRL SZ 6.5 (GLOVE) ×6 IMPLANT
GLOVE BIO SURGEON STRL SZ7.5 (GLOVE) ×3 IMPLANT
GLOVE BIOGEL PI IND STRL 6.5 (GLOVE) ×2 IMPLANT
GLOVE BIOGEL PI IND STRL 7.5 (GLOVE) ×1 IMPLANT
GLOVE BIOGEL PI IND STRL 8 (GLOVE) ×1 IMPLANT
GLOVE SURG ORTHO LTX SZ8 (GLOVE) ×2 IMPLANT
GOWN STRL REUS W/ TWL LRG LVL3 (GOWN DISPOSABLE) ×2 IMPLANT
GOWN STRL REUS W/ TWL XL LVL3 (GOWN DISPOSABLE) ×2 IMPLANT
KIT BASIN OR (CUSTOM PROCEDURE TRAY) ×2 IMPLANT
KIT TURNOVER KIT B (KITS) ×2 IMPLANT
MANIFOLD NEPTUNE II (INSTRUMENTS) ×1 IMPLANT
NS IRRIG 1000ML POUR BTL (IV SOLUTION) ×2 IMPLANT
PACK GENERAL/GYN (CUSTOM PROCEDURE TRAY) ×1 IMPLANT
PACK ORTHO EXTREMITY (CUSTOM PROCEDURE TRAY) ×1 IMPLANT
PAD ARMBOARD POSITIONER FOAM (MISCELLANEOUS) ×4 IMPLANT
PADDING CAST COTTON 6X4 STRL (CAST SUPPLIES) ×1 IMPLANT
PIN GUIDE DRIL TIP 2.8X300 STE (PIN) IMPLANT
SCREW CANN 8X90X16 (Screw) IMPLANT
SCREW CANN 8X95X16 (Screw) IMPLANT
SET HNDPC FAN SPRY TIP SCT (DISPOSABLE) IMPLANT
STOCKINETTE IMPERVIOUS 9X36 MD (GAUZE/BANDAGES/DRESSINGS) ×1 IMPLANT
SUT MNCRL AB 3-0 PS2 18 (SUTURE) ×1 IMPLANT
SUT VIC AB 0 CT1 27XBRD ANBCTR (SUTURE) ×1 IMPLANT
SUT VIC AB 2-0 CT2 27 (SUTURE) ×1 IMPLANT
SWAB CULTURE ESWAB REG 1ML (MISCELLANEOUS) ×1 IMPLANT
TOWEL GREEN STERILE FF (TOWEL DISPOSABLE) ×3 IMPLANT
TUBE CONNECTING 12X1/4 (SUCTIONS) ×1 IMPLANT
UNDERPAD 30X36 HEAVY ABSORB (UNDERPADS AND DIAPERS) ×1 IMPLANT
WASHER FLAG 8.0 STRL (Washer) IMPLANT
WATER STERILE IRR 1000ML POUR (IV SOLUTION) ×2 IMPLANT
YANKAUER SUCT BULB TIP NO VENT (SUCTIONS) ×1 IMPLANT

## 2024-03-28 NOTE — Progress Notes (Signed)
 [facility] PROGRESS NOTE    Progress Note  Oscar [name]  FMW:[id number] DOB: [birth year] DOA: 03/27/2024 PCP: Frann Mabel Mt, DO     Brief Narrative:   Oscar [name] is an 89 y.o. male past medical history of CVA, heart failure with preserved EF and a pacemaker with a recent orthostatic hypotension, hyperlipidemia recurrent falls, with urgent right hip fracture requiring ORIF comes in with a left femoral neck fracture   Assessment/Plan:   Closed displaced fracture of left femoral neck (HCC) Patient was placed n.p.o. aspirin  was held. Orthopedic surgery was consulted for possible OR on 03/29/2024. DVT prophylaxis per orthopedic surgery. Analgesics per orthopedics. PT OT to evaluate postsurgical intervention. Start him on a bowel regimen.  Ground-level fall Will check orthostatics postop. Continue to monitor on telemetry, twelve-lead EKG showed ventricular paced rhythm.   DVT prophylaxis: lovenox  Family Communication:none Status is: Inpatient Remains inpatient appropriate because: Acute left hip fracture    Code Status:     Code Status Orders  (From admission, onward)           Start     Ordered   03/28/24 0226  Full code  Continuous       Question:  By:  Answer:  Other   03/28/24 0226           Code Status History     Date Active Date Inactive Code Status Order ID Comments User Context   12/12/2023 0223 12/17/2023 1605 Full Code [id number]  Charlton Evalene RAMAN, MD ED   10/02/2020 1547 10/02/2020 2229 Full Code [id number]  Francyne Headland, MD Inpatient   03/12/2020 0928 03/14/2020 1957 DNR [id number]  Barbarann Nest, MD ED   07/13/2019 1032 07/13/2019 1630 Full Code [id number]  Burnard Debby LABOR, MD Inpatient   01/27/2018 1125 01/28/2018 0216 Full Code [id number]  Louis Shove, MD Inpatient         IV Access:   Peripheral IV   Procedures and diagnostic studies:   CT HIP LEFT WO CONTRAST Result Date: 03/27/2024 CLINICAL DATA:  Left hip fracture  EXAM: CT OF THE LEFT HIP WITHOUT CONTRAST TECHNIQUE: Multidetector CT imaging of the left hip was performed according to the standard protocol. Multiplanar CT image reconstructions were also generated. RADIATION DOSE REDUCTION: This exam was performed according to the departmental dose-optimization program which includes automated exposure control, adjustment of the mA and/or kV according to patient size and/or use of iterative reconstruction technique. COMPARISON:  Left hip radiographs 03/27/2024 FINDINGS: Bones/Joint/Cartilage Transverse fracture of the subcapital left femoral neck with impaction of the fracture fragments. Mild valgus angulation. No inter trochanteric involvement. No dislocation of the hip joint. Degenerative changes in the hip joint with osteophyte formation on both sides of the joint. No significant effusion. Ligaments Suboptimally assessed by CT. Muscles and Tendons No intramuscular mass. Small intramuscular hematoma suggested in the anterior iliac muscle. Soft tissues No significant soft tissue infiltration or collection. Visualized pelvic organs appear intact. IMPRESSION: 1. Transverse fracture of the subcapital left femoral neck with impaction and mild valgus angulation. No inter trochanteric involvement. 2. Small anterior intramuscular hematoma. Electronically Signed   By: Elsie Gravely M.D.   On: 03/27/2024 19:42   DG Chest 1 View Result Date: 03/27/2024 CLINICAL DATA:  Fall EXAM: CHEST  1 VIEW COMPARISON:  12/11/2023 FINDINGS: Left-sided pacing device as before. No acute airspace disease or effusion. Stable cardiomediastinal silhouette with aortic atherosclerosis. No pneumothorax IMPRESSION: No active disease. Electronically Signed   By: Luke Scott HERO.D.  On: 03/27/2024 19:03   DG HIP UNILAT WITH PELVIS 2-3 VIEWS LEFT Result Date: 03/27/2024 CLINICAL DATA:  Left hip pain since falling last night. EXAM: DG HIP (WITH OR WITHOUT PELVIS) 2-3V LEFT COMPARISON:  Pelvic and left hip  radiographs 11/20/2004. Right hip radiographs 12/12/2023. Pelvic CT 09/19/2021. FINDINGS: The bones are demineralized. New, presumably acute subcapital fracture of the left femoral neck with mild impaction. The proximal right femur has a stable appearance status post intramedullary nail and dynamic screw fixation of the previously demonstrated intertrochanteric fracture. No pelvic fracture or dislocation identified. Minimal hip degenerative changes for age. IMPRESSION: 1. New, presumably acute subcapital fracture of the left femoral neck. 2. Stable appearance of the proximal right femur status post ORIF. Electronically Signed   By: Elsie Perone M.D.   On: 03/27/2024 18:01   CT Head Wo Contrast Result Date: 03/27/2024 CLINICAL DATA:  Dizzy spell with fall this morning EXAM: CT HEAD WITHOUT CONTRAST CT CERVICAL SPINE WITHOUT CONTRAST TECHNIQUE: Multidetector CT imaging of the head and cervical spine was performed following the standard protocol without intravenous contrast. Multiplanar CT image reconstructions of the cervical spine were also generated. RADIATION DOSE REDUCTION: This exam was performed according to the departmental dose-optimization program which includes automated exposure control, adjustment of the mA and/or kV according to patient size and/or use of iterative reconstruction technique. COMPARISON:  None Available. FINDINGS: CT HEAD FINDINGS Brain: No evidence of acute infarction, hemorrhage, hydrocephalus, extra-axial collection or mass lesion/mass effect. Periventricular white matter hypodensity. Vascular: No hyperdense vessel or unexpected calcification. Skull: Normal. Negative for fracture or focal lesion. Sinuses/Orbits: No acute finding. Other: None. CT CERVICAL SPINE FINDINGS Alignment: Postoperative listhesis of C4-C5. Skull base and vertebrae: No acute fracture. No primary bone lesion or focal pathologic process. Soft tissues and spinal canal: No prevertebral fluid or swelling. No  visible canal hematoma. Disc levels: Status post anterior cervical discectomy and fusion of C4-C5 with ankylosis of C3-C6 and severe disc degenerative change of C6-C7. Upper chest: Negative. Other: None. IMPRESSION: 1. No acute intracranial pathology. Small-vessel white matter disease in keeping with advanced patient age. 2. No fracture or static subluxation of the cervical spine. 3. Status post anterior cervical discectomy and fusion of C4-C5 with ankylosis of C3-C6 and severe disc degenerative change of C6-C7. Electronically Signed   By: Marolyn JONETTA Jaksch M.D.   On: 03/27/2024 17:59   CT Cervical Spine Wo Contrast Result Date: 03/27/2024 CLINICAL DATA:  Dizzy spell with fall this morning EXAM: CT HEAD WITHOUT CONTRAST CT CERVICAL SPINE WITHOUT CONTRAST TECHNIQUE: Multidetector CT imaging of the head and cervical spine was performed following the standard protocol without intravenous contrast. Multiplanar CT image reconstructions of the cervical spine were also generated. RADIATION DOSE REDUCTION: This exam was performed according to the departmental dose-optimization program which includes automated exposure control, adjustment of the mA and/or kV according to patient size and/or use of iterative reconstruction technique. COMPARISON:  None Available. FINDINGS: CT HEAD FINDINGS Brain: No evidence of acute infarction, hemorrhage, hydrocephalus, extra-axial collection or mass lesion/mass effect. Periventricular white matter hypodensity. Vascular: No hyperdense vessel or unexpected calcification. Skull: Normal. Negative for fracture or focal lesion. Sinuses/Orbits: No acute finding. Other: None. CT CERVICAL SPINE FINDINGS Alignment: Postoperative listhesis of C4-C5. Skull base and vertebrae: No acute fracture. No primary bone lesion or focal pathologic process. Soft tissues and spinal canal: No prevertebral fluid or swelling. No visible canal hematoma. Disc levels: Status post anterior cervical discectomy and fusion of  C4-C5 with ankylosis of C3-C6  and severe disc degenerative change of C6-C7. Upper chest: Negative. Other: None. IMPRESSION: 1. No acute intracranial pathology. Small-vessel white matter disease in keeping with advanced patient age. 2. No fracture or static subluxation of the cervical spine. 3. Status post anterior cervical discectomy and fusion of C4-C5 with ankylosis of C3-C6 and severe disc degenerative change of C6-C7. Electronically Signed   By: Marolyn JONETTA Jaksch M.D.   On: 03/27/2024 17:59     Medical Consultants:   None.   Subjective:    Oscar [name] relates his pain is controlled  Objective:    Vitals:   03/27/24 1900 03/27/24 1915 03/28/24 0110 03/28/24 0138  BP: (!) 142/78 (!) 142/83 (!) 145/80 118/77  Pulse: 74  82 77  Resp:   16 16  Temp:   97.9 F (36.6 C) 97.6 F (36.4 C)  TempSrc:   Oral Oral  SpO2: 98%  98% 98%   SpO2: 98 %   Intake/Output Summary (Last 24 hours) at 03/28/2024 9366 Last data filed at 03/27/2024 2157 Gross per 24 hour  Intake --  Output 300 ml  Net -300 ml   There were no vitals filed for this visit.  Exam: General exam: In no acute distress. Respiratory system: Good air movement and clear to auscultation. Cardiovascular system: S1 & S2 heard, RRR. No JVD. Gastrointestinal system: Abdomen is nondistended, soft and nontender.  Extremities: No pedal edema. Skin: No rashes, lesions or ulcers Psychiatry: Judgement and insight appear normal. Mood & affect appropriate.    Data Reviewed:    Labs: Basic Metabolic Panel: Recent Labs  Lab 03/27/24 1640 03/28/24 0507  NA 138 137  K 4.4 4.1  CL 103 107  CO2 22 18*  GLUCOSE 103* 83  BUN 23 16  CREATININE 1.03 1.04  CALCIUM  9.7 8.8*  MG  --  2.0  PHOS  --  3.7   GFR Estimated Creatinine Clearance: 42.4 mL/min (by C-G formula based on SCr of 1.04 mg/dL). Liver Function Tests: Recent Labs  Lab 03/27/24 1640  AST 18  ALT 13  ALKPHOS 109  BILITOT 0.3  PROT 6.9  ALBUMIN  4.0    No results for input(s): LIPASE, AMYLASE in the last 168 hours. No results for input(s): AMMONIA in the last 168 hours. Coagulation profile Recent Labs  Lab 03/28/24 0507  INR 1.1   COVID-19 Labs  No results for input(s): DDIMER, FERRITIN, LDH, CRP in the last 72 hours.  Lab Results  Component Value Date   SARSCOV2NAA NEGATIVE 12/26/2020   SARSCOV2NAA NEGATIVE 09/28/2020   SARSCOV2NAA NEGATIVE 03/12/2020   SARSCOV2NAA NOT DETECTED 07/10/2019    CBC: Recent Labs  Lab 03/27/24 1640 03/28/24 0507  WBC 7.6 5.7  HGB 12.1* 10.7*  HCT 38.1* 35.1*  MCV 88.4 91.9  PLT 178 126*   Cardiac Enzymes: No results for input(s): CKTOTAL, CKMB, CKMBINDEX, TROPONINI in the last 168 hours. BNP (last 3 results) No results for input(s): PROBNP in the last 8760 hours. CBG: No results for input(s): GLUCAP in the last 168 hours. D-Dimer: No results for input(s): DDIMER in the last 72 hours. Hgb A1c: No results for input(s): HGBA1C in the last 72 hours. Lipid Profile: No results for input(s): CHOL, HDL, LDLCALC, TRIG, CHOLHDL, LDLDIRECT in the last 72 hours. Thyroid  function studies: No results for input(s): TSH, T4TOTAL, T3FREE, THYROIDAB in the last 72 hours.  Invalid input(s): FREET3 Anemia work up: No results for input(s): VITAMINB12, FOLATE, FERRITIN, TIBC, IRON, RETICCTPCT in the last 72 hours. Sepsis  Labs: Recent Labs  Lab 03/27/24 1640 03/28/24 0507  WBC 7.6 5.7   Microbiology Recent Results (from the past 240 hours)  Surgical pcr screen     Status: None   Collection Time: 03/28/24  2:06 AM   Specimen: Nasal Mucosa; Nasal Swab  Result Value Ref Range Status   MRSA, PCR NEGATIVE NEGATIVE Final   Staphylococcus aureus NEGATIVE NEGATIVE Final    Comment: (NOTE) The Xpert SA Assay (FDA approved for NASAL specimens in patients 32 years of age and older), is one component of a comprehensive surveillance  program. It is not intended to diagnose infection nor to guide or monitor treatment. Performed at [facility], [address], [place], [place] [address]      Medications:    cholecalciferol   1,000 Units Oral Daily   fentaNYL  (SUBLIMAZE ) injection  25 mcg Intravenous Once   melatonin  6 mg Oral QHS   ondansetron  (ZOFRAN ) IV  4 mg Intravenous Once   pantoprazole   40 mg Oral Daily   sodium chloride  flush  3 mL Intravenous Q12H   tamsulosin   0.4 mg Oral QPC supper   Continuous Infusions:    LOS: 0 days   Oscar [name]  [facility]  03/28/2024, 6:33 AM

## 2024-03-28 NOTE — Discharge Instructions (Addendum)
 Orthopedic Discharge Instructions  Diet: As you were doing prior to hospitalization   Shower:  May shower but keep the wounds dry, use an occlusive plastic wrap, NO SOAKING IN TUB.  If the bandage gets wet, change with a clean dry gauze.  If you have a splint on, leave the splint in place and keep the splint dry with a plastic bag.  Dressing:  You may change your dressing 3-5 days after surgery.  If the dressing remains clean and dry it can also be left on until follow up.  If you change the dressing replace with clean gauze and tape or ace wrap.  For most surgeries, the stitches used are dissolvable and don't need to be removed.  However, depending on your surgery, you may have stitches that will need to be removed in the office in about 2 weeks.    Activity:  Increase activity slowly as tolerated, but follow the weight bearing instructions below.  Do not drive for the next 4-6 weeks.  In addition, you cannot be taking narcotics while you drive, and you must feel in control of the vehicle.    Weight Bearing:   You may bear weight on your surgical leg as tolerated.    Blood clot prevention (DVT Prophylaxis): After surgery you are at an increased risk for a blood clot. you were prescribed a blood thinner, Aspirin 81 mg, to be taken twice daily for a total of 4 weeks from surgery to help reduce your risk of getting a blood clot. Signs of a pulmonary embolus (blood clot in the lungs) include sudden short of breath, feeling lightheaded or dizzy, chest pain with a deep breath, rapid pulse rapid breathing.  Signs of a blood clot in your arms or legs include new unexplained swelling and cramping, warm, red or darkened skin around the painful area.  Please call the office or 911 right away if these signs or symptoms develop.  To prevent constipation: you may use a stool softener such as - Colace (over the counter) 100 mg by mouth twice a day  Drink plenty of fluids (prune juice may be helpful) and high fiber  foods Miralax (over the counter) for constipation as needed.    Itching:  If you experience itching with your medications, try taking only a single pain pill, or even half a pain pill at a time.  You may take up to 10 pain pills per day, and you can also use benadryl over the counter for itching or also to help with sleep.   Precautions:  If you experience chest pain or shortness of breath - call 911 immediately for transfer to the hospital emergency department!!  Call office [phone]) for the following: Temperature greater than 101F Persistent nausea and vomiting Severe uncontrolled pain Redness, tenderness, or signs of infection (pain, swelling, redness, odor or green/yellow discharge around the site) Difficulty breathing, headache or visual disturbances Hives Persistent dizziness or light-headedness Extreme fatigue Any other questions or concerns you may have after discharge  In an emergency, call 911 or go to an Emergency Department at a nearby hospital  Follow- Up Appointment:  Please call for an appointment to be seen approximately 2-3 week after surgery in [facility] with your surgeon Dr. Weber Cooks - [phone] Address: [address], [place], [place] [address]

## 2024-03-28 NOTE — Transfer of Care (Signed)
 Immediate Anesthesia Transfer of Care Note  Patient: Oscar [name]  Procedure(s) Performed: FIXATION, FEMUR, NECK, PERCUTANEOUS, USING SCREW (Left)  Patient Location: PACU  Anesthesia Type:General  Level of Consciousness: drowsy  Airway & Oxygen Therapy: Patient Spontanous Breathing  Post-op Assessment: Report given to RN and Post -op Vital signs reviewed and stable  Post vital signs: Reviewed and stable  Last Vitals:  Vitals Value Taken Time  BP 149/80   Temp    Pulse 98 03/28/24 11:28  Resp    SpO2 92 % 03/28/24 11:28  Vitals shown include unfiled device data.  Last Pain:  Vitals:   03/28/24 0822  TempSrc: Oral  PainSc: 0-No pain         Complications: No notable events documented.

## 2024-03-28 NOTE — Op Note (Addendum)
 03/27/2024 - 03/28/2024  11:02 AM  PATIENT:  Oscar [name]    PRE-OPERATIVE DIAGNOSIS:  Left valgus impacted femur Neck Fracture  POST-OPERATIVE DIAGNOSIS:  Same  PROCEDURE: Cannulated screw fixation left femoral neck  SURGEON:  Lacey Wallman A Astraea Gaughran, MD  PHYSICIAN ASSISTANT: Bernarda Mclean, PA-C was present and scrubbed throughout the case, critical for completion in a timely fashion, and for retraction, instrumentation, and closure.  ANESTHESIA:   General  ESTIMATED BLOOD LOSS: 50cc.  PREOPERATIVE INDICATIONS:  Oscar [name] is a  89 y.o. male who fell and was found to have a diagnosis of a valgus impacted left femoral neck fracture who elected for surgical management.    The risks benefits and alternatives were discussed with the patient preoperatively including but not limited to the risks of infection, bleeding, nerve injury, cardiopulmonary complications, blood clots, malunion, nonunion, avascular necrosis, the need for revision surgery, the potential for conversion to hemiarthroplasty, among others, and the patient was willing to proceed.  OPERATIVE IMPLANTS: 8.22mm cannulated screws x3 with 1 washer on superior anterior screw  OPERATIVE PROCEDURE: The patient was brought to the operating room and placed in supine position. IV antibiotics were given. General anesthesia administered. Foley was also given. The patient was placed on the fracture table. The operative extremity was positioned, without any significant reduction maneuver and was prepped and draped in usual sterile fashion.  Time out was performed.  Small incision was made distal to the greater trochanter, and 3 guidewires were introduced Into an inverted triangle configuration. The lengths were measured. The reduction was slightly valgus, and near-anatomic. I opened the cortex with a cannulated drill, and then placed the screws into position. Satisfactory fixation was achieved.  The wounds were irrigated copiously, and  repaired with Vicryl and monocryl. Dermabond and mepilex dressing. There no complications and the patient tolerated the procedure well.  Post op recs: WB: WBAT LLE Abx: ancef  x23 hours post op Imaging: PACU xrays Dressing: keep intact until follow up, change PRN if soiled or saturated. DVT prophylaxis: lovenox  in house and dc on aspirin  Follow up: 2 weeks after surgery for a wound check with Dr. Edna at [facility].  Address: [address], [place], [place] [address]  Office Phone: [phone]  Toribio Edna, MD

## 2024-03-28 NOTE — ED Notes (Signed)
 Nursing report called to [phone]

## 2024-03-28 NOTE — Anesthesia Procedure Notes (Signed)
 Procedure Name: Intubation Date/Time: 03/28/2024 10:19 AM  Performed by: Claudene Florina Boga, CRNAPre-anesthesia Checklist: Patient identified, Emergency Drugs available, Suction available and Patient being monitored Patient Re-evaluated:Patient Re-evaluated prior to induction Oxygen Delivery Method: Circle System Utilized Preoxygenation: Pre-oxygenation with 100% oxygen Induction Type: IV induction Ventilation: Mask ventilation without difficulty Laryngoscope Size: Mac and 4 Grade View: Grade I Tube type: Oral Tube size: 7.5 mm Number of attempts: 1 Airway Equipment and Method: Stylet Placement Confirmation: ETT inserted through vocal cords under direct vision, positive ETCO2 and breath sounds checked- equal and bilateral Secured at: 22 cm Tube secured with: Tape Dental Injury: Teeth and Oropharynx as per pre-operative assessment

## 2024-03-28 NOTE — H&P (Signed)
 History and Physical    Oscar [name] FMW:[id number] DOB: 10-Jun-1931 DOA: 03/27/2024  PCP: Frann Mabel Mt, DO   Patient coming from: Transfer from [facility] ED    Chief Complaint: GLF, L hip fracture    HPI:  Oscar [name] is a 89 y.o. male with hx of CVA, heart failure with preserved ejection fraction, pacemaker, mixed hypertension with recent orthostatic hypotension, hyperlipidemia, recurrent falls, recent right hip fracture requiring ORIF in 3/'25, who is transferred from [facility] ED for left femoral neck fracture.  Patient reports that he was getting up from bed to use the bathroom and suddenly became very dizzy and next thing he remembers he was on the floor and unable to stand due to left hip pain.  Denies syncopal episode although based on his story somewhat suspicious for this.  Denies any other sites of pain other than the left hip.  Currently his pain is minimal while at rest.  No other recent illness.  Review of Systems:  ROS complete and negative except as marked above   No Known Allergies  Prior to Admission medications   Medication Sig Start Date End Date Taking? Authorizing Provider  acetaminophen  (TYLENOL ) 500 MG tablet Take 2 tablets (1,000 mg total) by mouth every 8 (eight) hours. 12/17/23   Shahmehdi, Adriana LABOR, MD  aspirin  EC 325 MG tablet Take 1 tablet (325 mg total) by mouth daily. 12/15/23   Danton Lauraine LABOR, PA-C  atorvastatin  (LIPITOR) 40 MG tablet Take 1 tablet (40 mg total) by mouth daily. Patient not taking: Reported on 12/12/2023 11/27/20   Revankar, Jennifer SAUNDERS, MD  Cholecalciferol  125 MCG (5000 UT) TABS Take 1 tablet by mouth daily.    [provider]  ferrous sulfate  325 (65 FE) MG EC tablet Take 1 tablet (325 mg total) by mouth daily with breakfast. 01/20/24   Wendling, Mabel Mt, DO  finasteride  (PROSCAR ) 5 MG tablet Take 1 tablet (5 mg total) by mouth daily. 03/23/24   Frann Mabel Mt, DO  Multiple Vitamin (MULTIVITAMIN WITH MINERALS) TABS  tablet Take 1 tablet by mouth daily. 12/17/23   Shahmehdi, Adriana LABOR, MD  pantoprazole  (PROTONIX ) 40 MG tablet Take 1 tablet (40 mg total) by mouth daily. 11/19/23   Frann Mabel Mt, DO    Past Medical History:  Diagnosis Date   Abnormal nuclear stress test    Abnormal results of thyroid  function studies 01/19/2014   Formatting of this note might be different from the original. August 2016  TSH 4.01   Apneic episode 11/29/2020   Ascending aorta dilatation (HCC) 12/07/2020   Atrial tachycardia, paroxysmal (HCC) 08/05/2019   Bunion of great toe of right foot 09/18/2016   Centrilobular emphysema (HCC) 11/26/2020   Cervical stenosis of spine    CHB (complete heart block) (HCC) 09/28/2020   Coronary artery disease involving native coronary artery of native heart without angina pectoris 08/05/2019   CVA (cerebral vascular accident) (HCC) 03/12/2020   Encounter for general adult medical examination without abnormal findings 05/20/2016   Essential hypertension 03/12/2020   Exertional dyspnea 01/18/2020   Gastro-esophageal reflux disease with esophagitis 09/28/2010   GERD (gastroesophageal reflux disease)    History of bladder cancer 03/14/2014   History of concussion    X2   NO RESIDUALS   History of peptic ulcer 1970'S   Hypercholesterolemia    Hypothyroidism due to Hashimoto's thyroiditis 07/23/2019   LBBB (left bundle branch block)    Macular degeneration of left eye    Mixed hyperlipidemia  11/24/2013   Formatting of this note might be different from the original. History of TIA.  The goal is to have your total cholesterol < 200, the HDL (good cholesterol) >40, and the LDL (bad cholesterol) <100. It is recomended that you follow a good low fat diet and exercise for 30 minutes 3-4 times a week.   Personal history of tobacco use, presenting hazards to health 01/12/2014   Presence of permanent cardiac pacemaker 11/15/2020   S/P bilateral cataract extraction 09/18/2016   Serum reaction  due to vaccination 09/27/2011   Formatting of this note might be different from the original. IMPRESSION: seen on Wednesday for reaction to right arm after pneumovax, had redness, warmth , tenderness and edema of right upper arm which is now resolved. monitor area. use warm compresses prn. call prn. discussed will not need another pneumovax, he will consider zostavax in future.   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   Status post placement of implantable loop recorder 11/08/2014   TIA (transient ischemic attack) 04/09/2014    Past Surgical History:  Procedure Laterality Date   ANTERIOR CERVICAL DECOMP/DISCECTOMY FUSION N/A 01/27/2018   Procedure: Anterior Cervical Discectomy Fusion - Cervical Four- Cervical Five;  Surgeon: Louis Shove, MD;  Location: [facility] OR;  Service: Neurosurgery;  Laterality: N/A;  Anterior Cervical Discectomy Fusion - Cervical Four- Cervical Five   APPENDECTOMY  1970   CARDIOVASCULAR STRESS TEST  03/22/09   CYSTOSCOPY WITH BIOPSY N/A 02/10/2013   Procedure: CYSTOSCOPY WITH COLD CUP BIOPSY/FULGERATION;  Surgeon: Alm GORMAN Fragmin, MD;  Location: [facility];  Service: Urology;  Laterality: N/A;  ALSO FULGERATION    INGUINAL HERNIA REPAIR Right 07-14-2002   INTRAMEDULLARY (IM) NAIL INTERTROCHANTERIC Right 12/12/2023   Procedure: FIXATION, FRACTURE, INTERTROCHANTERIC, WITH INTRAMEDULLARY ROD;  Surgeon: Kendal Franky SQUIBB, MD;  Location: [facility] OR;  Service: Orthopedics;  Laterality: Right;   LEFT HEART CATH AND CORONARY ANGIOGRAPHY N/A 07/13/2019   Procedure: LEFT HEART CATH AND CORONARY ANGIOGRAPHY;  Surgeon: Burnard Debby LABOR, MD;  Location: [facility] INVASIVE CV LAB;  Service: Cardiovascular;  Laterality: N/A;   LOOP RECORDER IMPLANT N/A 05/24/2014   Procedure: LOOP RECORDER IMPLANT;  Surgeon: Jerel Balding, MD;  Location: [facility] CATH LAB;  Service: Cardiovascular;  Laterality: N/A;   LOOP RECORDER REMOVAL N/A 10/02/2020   Procedure: LOOP RECORDER REMOVAL;  Surgeon:  Balding Jerel, MD;  Location: [facility] INVASIVE CV LAB;  Service: Cardiovascular;  Laterality: N/A;   NECK SURGERY     PACEMAKER IMPLANT N/A 10/02/2020   Procedure: PACEMAKER IMPLANT;  Surgeon: Balding Jerel, MD;  Location: [facility] INVASIVE CV LAB;  Service: Cardiovascular;  Laterality: N/A;   TONSILLECTOMY     TRANSURETHRAL RESECTION OF BLADDER TUMOR  03-10-2008   TRANSURETHRAL RESECTION OF BLADDER TUMOR Bilateral 12/28/2020   Procedure: TRANSURETHRAL RESECTION OF BLADDER TUMOR BLADDER BIOPSY FULGARATION BILATERAL RETROGRADE PYELOGRAM(TURBT);  Surgeon: Cam Morene ORN, MD;  Location: [facility] ORS;  Service: Urology;  Laterality: Bilateral;   TUMOR EXCISION Left    Left arm     reports that he quit smoking about 45 years ago. His smoking use included cigarettes. He started smoking about 75 years ago. He has a 15 pack-year smoking history. He has never used smokeless tobacco. He reports current alcohol use. He reports that he does not use drugs.  Family History  Problem Relation Age of Onset   Heart failure Mother    Stroke Father    Diabetes Brother    Heart failure Brother  Physical Exam: Vitals:   03/27/24 1900 03/27/24 1915 03/28/24 0110 03/28/24 0138  BP: (!) 142/78 (!) 142/83 (!) 145/80 118/77  Pulse: 74  82 77  Resp:   16 16  Temp:   97.9 F (36.6 C) 97.6 F (36.4 C)  TempSrc:   Oral Oral  SpO2: 98%  98% 98%    Gen: Awake, alert, elderly, frail CV: Regular, normal S1, S2, no murmurs  Resp: Normal WOB, CTAB  Abd: Flat, normoactive, nontender MSK: Symmetric, no edema.  Distally neurovascularly intact to the left Skin: No rashes or lesions to exposed skin  Neuro: Alert and interactive  Psych: euthymic, appropriate    Data review:   Labs reviewed, notable for:   Chemistries and blood counts unremarkable, hemoglobin 12  Micro:  Results for orders placed or performed in visit on 01/27/24  Fecal occult blood, imunochemical     Status: None   Collection Time: 01/27/24   4:22 PM   Specimen: Stool  Result Value Ref Range Status   Fecal Occult Bld Negative Negative Final    Imaging reviewed:  CT HIP LEFT WO CONTRAST Result Date: 03/27/2024 CLINICAL DATA:  Left hip fracture EXAM: CT OF THE LEFT HIP WITHOUT CONTRAST TECHNIQUE: Multidetector CT imaging of the left hip was performed according to the standard protocol. Multiplanar CT image reconstructions were also generated. RADIATION DOSE REDUCTION: This exam was performed according to the departmental dose-optimization program which includes automated exposure control, adjustment of the mA and/or kV according to patient size and/or use of iterative reconstruction technique. COMPARISON:  Left hip radiographs 03/27/2024 FINDINGS: Bones/Joint/Cartilage Transverse fracture of the subcapital left femoral neck with impaction of the fracture fragments. Mild valgus angulation. No inter trochanteric involvement. No dislocation of the hip joint. Degenerative changes in the hip joint with osteophyte formation on both sides of the joint. No significant effusion. Ligaments Suboptimally assessed by CT. Muscles and Tendons No intramuscular mass. Small intramuscular hematoma suggested in the anterior iliac muscle. Soft tissues No significant soft tissue infiltration or collection. Visualized pelvic organs appear intact. IMPRESSION: 1. Transverse fracture of the subcapital left femoral neck with impaction and mild valgus angulation. No inter trochanteric involvement. 2. Small anterior intramuscular hematoma. Electronically Signed   By: Elsie Gravely M.D.   On: 03/27/2024 19:42   DG Chest 1 View Result Date: 03/27/2024 CLINICAL DATA:  Fall EXAM: CHEST  1 VIEW COMPARISON:  12/11/2023 FINDINGS: Left-sided pacing device as before. No acute airspace disease or effusion. Stable cardiomediastinal silhouette with aortic atherosclerosis. No pneumothorax IMPRESSION: No active disease. Electronically Signed   By: Luke Bun M.D.   On: 03/27/2024  19:03   DG HIP UNILAT WITH PELVIS 2-3 VIEWS LEFT Result Date: 03/27/2024 CLINICAL DATA:  Left hip pain since falling last night. EXAM: DG HIP (WITH OR WITHOUT PELVIS) 2-3V LEFT COMPARISON:  Pelvic and left hip radiographs 11/20/2004. Right hip radiographs 12/12/2023. Pelvic CT 09/19/2021. FINDINGS: The bones are demineralized. New, presumably acute subcapital fracture of the left femoral neck with mild impaction. The proximal right femur has a stable appearance status post intramedullary nail and dynamic screw fixation of the previously demonstrated intertrochanteric fracture. No pelvic fracture or dislocation identified. Minimal hip degenerative changes for age. IMPRESSION: 1. New, presumably acute subcapital fracture of the left femoral neck. 2. Stable appearance of the proximal right femur status post ORIF. Electronically Signed   By: Elsie Perone M.D.   On: 03/27/2024 18:01   CT Head Wo Contrast Result Date: 03/27/2024 CLINICAL DATA:  Dizzy spell with fall this morning EXAM: CT HEAD WITHOUT CONTRAST CT CERVICAL SPINE WITHOUT CONTRAST TECHNIQUE: Multidetector CT imaging of the head and cervical spine was performed following the standard protocol without intravenous contrast. Multiplanar CT image reconstructions of the cervical spine were also generated. RADIATION DOSE REDUCTION: This exam was performed according to the departmental dose-optimization program which includes automated exposure control, adjustment of the mA and/or kV according to patient size and/or use of iterative reconstruction technique. COMPARISON:  None Available. FINDINGS: CT HEAD FINDINGS Brain: No evidence of acute infarction, hemorrhage, hydrocephalus, extra-axial collection or mass lesion/mass effect. Periventricular white matter hypodensity. Vascular: No hyperdense vessel or unexpected calcification. Skull: Normal. Negative for fracture or focal lesion. Sinuses/Orbits: No acute finding. Other: None. CT CERVICAL SPINE FINDINGS  Alignment: Postoperative listhesis of C4-C5. Skull base and vertebrae: No acute fracture. No primary bone lesion or focal pathologic process. Soft tissues and spinal canal: No prevertebral fluid or swelling. No visible canal hematoma. Disc levels: Status post anterior cervical discectomy and fusion of C4-C5 with ankylosis of C3-C6 and severe disc degenerative change of C6-C7. Upper chest: Negative. Other: None. IMPRESSION: 1. No acute intracranial pathology. Small-vessel white matter disease in keeping with advanced patient age. 2. No fracture or static subluxation of the cervical spine. 3. Status post anterior cervical discectomy and fusion of C4-C5 with ankylosis of C3-C6 and severe disc degenerative change of C6-C7. Electronically Signed   By: Marolyn JONETTA Jaksch M.D.   On: 03/27/2024 17:59   CT Cervical Spine Wo Contrast Result Date: 03/27/2024 CLINICAL DATA:  Dizzy spell with fall this morning EXAM: CT HEAD WITHOUT CONTRAST CT CERVICAL SPINE WITHOUT CONTRAST TECHNIQUE: Multidetector CT imaging of the head and cervical spine was performed following the standard protocol without intravenous contrast. Multiplanar CT image reconstructions of the cervical spine were also generated. RADIATION DOSE REDUCTION: This exam was performed according to the departmental dose-optimization program which includes automated exposure control, adjustment of the mA and/or kV according to patient size and/or use of iterative reconstruction technique. COMPARISON:  None Available. FINDINGS: CT HEAD FINDINGS Brain: No evidence of acute infarction, hemorrhage, hydrocephalus, extra-axial collection or mass lesion/mass effect. Periventricular white matter hypodensity. Vascular: No hyperdense vessel or unexpected calcification. Skull: Normal. Negative for fracture or focal lesion. Sinuses/Orbits: No acute finding. Other: None. CT CERVICAL SPINE FINDINGS Alignment: Postoperative listhesis of C4-C5. Skull base and vertebrae: No acute fracture. No  primary bone lesion or focal pathologic process. Soft tissues and spinal canal: No prevertebral fluid or swelling. No visible canal hematoma. Disc levels: Status post anterior cervical discectomy and fusion of C4-C5 with ankylosis of C3-C6 and severe disc degenerative change of C6-C7. Upper chest: Negative. Other: None. IMPRESSION: 1. No acute intracranial pathology. Small-vessel white matter disease in keeping with advanced patient age. 2. No fracture or static subluxation of the cervical spine. 3. Status post anterior cervical discectomy and fusion of C4-C5 with ankylosis of C3-C6 and severe disc degenerative change of C6-C7. Electronically Signed   By: Marolyn JONETTA Jaksch M.D.   On: 03/27/2024 17:59    EKG:  Personally reviewed, AV paced  ED Course:  Outside EDP consulted with orthopedics Dr. Wilder with tentative plan for OR today.   Assessment/Plan:  89 y.o. male with hx CVA, heart failure with preserved ejection fraction, pacemaker, mixed hypertension with recent orthostatic hypotension, hyperlipidemia, recurrent falls, recent right hip fracture requiring ORIF in 3/'25, who is transferred from [facility] ED for left femoral neck fracture.  L femoral neck fracture Clinical diagnosis osteoporosis -  Orthopedic surgery consulted, tentative plan for OR tomorrow -N.p.o.   -Hold home aspirin  secondary prevention for stroke -DVT prophylaxis to be readdressed postop; SCD for now  -Tele monitoring periop -PT/ OT eval will need to be ordered post op  -Pain mgmt: Tylenol  prn for mild, oxycodone  2.5/5 mg p.o. every 4 hours prn for moderate/severe, Dilaudid  0.5 mg IV every 4 hours prn for breakthrough -Check vitamin D , start vitamin D3 1000 IU daily -Will need follow-up with PCP for further treatment of osteoporosis   Ground-level fall, suspect orthostatic possible syncopal  EKG is AV paced -Needs orthostatics postop. -Telemonitoring  Chronic medical problems: CVA: Holding home aspirin  for now.  No  longer taking statin. Heart failure with preserved ejection fraction, with out acute exacerbation: Not a diuretic. Pacemaker: Noted Mixed hypertension with recent orthostatic hypotension: Suspect orthostasis may be underlying his falls, see above.  Not on antihypertensives prior to admission. Hyperlipidemia: Not on statin. BPH: continue home tamsulosin   GERD: continue home PPI   There is no height or weight on file to calculate BMI.    DVT prophylaxis:  SCDs Code Status:  Full Code Diet:  Diet Orders (From admission, onward)     Start     Ordered   03/28/24 0227  Diet NPO time specified Except for: Sips with Meds  Diet effective now       Question:  Except for  Answer:  Noralyn with Meds   03/28/24 0226           Family Communication: None Consults: Orthopedics Admission status:   Inpatient, Telemetry bed  Severity of Illness: The appropriate patient status for this patient is INPATIENT. Inpatient status is judged to be reasonable and necessary in order to provide the required intensity of service to ensure the patient's safety. The patient's presenting symptoms, physical exam findings, and initial radiographic and laboratory data in the context of their chronic comorbidities is felt to place them at high risk for further clinical deterioration. Furthermore, it is not anticipated that the patient will be medically stable for discharge from the hospital within 2 midnights of admission.   * I certify that at the point of admission it is my clinical judgment that the patient will require inpatient hospital care spanning beyond 2 midnights from the point of admission due to high intensity of service, high risk for further deterioration and high frequency of surveillance required.*   Dorn Dawson, MD [facility]  How to contact the [facility] Attending or Consulting provider 7A - 7P or covering provider during after hours 7P -7A, for this patient.  Check the care team in [facility] and  look for a) attending/consulting [facility] provider listed and b) the [facility] team listed Log into [email or web address] and use [facility]'s universal password to access. If you do not have the password, please contact the hospital operator. Locate the [facility] provider you are looking for under [facility] and page to a number that you can be directly reached. If you still have difficulty reaching the provider, please page the [facility] (Director on Call) for the Hospitalists listed on amion for assistance.  03/28/2024, 3:39 AM

## 2024-03-28 NOTE — Consult Note (Signed)
 ORTHOPAEDIC CONSULTATION  REQUESTING PHYSICIAN: Oscar Celinda Balo, MD  Chief Complaint: Left hip pain after fall  HPI: Oscar [name] is a 89 y.o. male with history of recent right intertrochanteric fracture undergoing ORIF by Dr. Kendal in March of this year.  The other day he was getting up to the go to the bathroom when he felt dizzy and fell.  He tried to stand but had significant difficulty and discomfort.  He presented to the [facility] and was found to have a valgus impacted femoral neck fracture.  He was transferred to [facility].  This morning he denies pain other joints or extremities.  He denies distal numbness and tingling.  Past Medical History:  Diagnosis Date   Abnormal nuclear stress test    Abnormal results of thyroid  function studies 01/19/2014   Formatting of this note might be different from the original. August 2016  TSH 4.01   Apneic episode 11/29/2020   Ascending aorta dilatation (HCC) 12/07/2020   Atrial tachycardia, paroxysmal (HCC) 08/05/2019   Bunion of great toe of right foot 09/18/2016   Centrilobular emphysema (HCC) 11/26/2020   Cervical stenosis of spine    CHB (complete heart block) (HCC) 09/28/2020   Coronary artery disease involving native coronary artery of native heart without angina pectoris 08/05/2019   CVA (cerebral vascular accident) (HCC) 03/12/2020   Encounter for general adult medical examination without abnormal findings 05/20/2016   Essential hypertension 03/12/2020   Exertional dyspnea 01/18/2020   Gastro-esophageal reflux disease with esophagitis 09/28/2010   GERD (gastroesophageal reflux disease)    History of bladder cancer 03/14/2014   History of concussion    X2   NO RESIDUALS   History of peptic ulcer 1970'S   Hypercholesterolemia    Hypothyroidism due to Hashimoto's thyroiditis 07/23/2019   LBBB (left bundle branch block)    Macular degeneration of left eye    Mixed hyperlipidemia 11/24/2013   Formatting of this  note might be different from the original. History of TIA.  The goal is to have your total cholesterol < 200, the HDL (good cholesterol) >40, and the LDL (bad cholesterol) <100. It is recomended that you follow a good low fat diet and exercise for 30 minutes 3-4 times a week.   Personal history of tobacco use, presenting hazards to health 01/12/2014   Presence of permanent cardiac pacemaker 11/15/2020   S/P bilateral cataract extraction 09/18/2016   Serum reaction due to vaccination 09/27/2011   Formatting of this note might be different from the original. IMPRESSION: seen on Wednesday for reaction to right arm after pneumovax, had redness, warmth , tenderness and edema of right upper arm which is now resolved. monitor area. use warm compresses prn. call prn. discussed will not need another pneumovax, he will consider zostavax in future.   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   Status post placement of implantable loop recorder 11/08/2014   TIA (transient ischemic attack) 04/09/2014   Past Surgical History:  Procedure Laterality Date   ANTERIOR CERVICAL DECOMP/DISCECTOMY FUSION N/A 01/27/2018   Procedure: Anterior Cervical Discectomy Fusion - Cervical Four- Cervical Five;  Surgeon: Oscar Shove, MD;  Location: [facility] OR;  Service: Neurosurgery;  Laterality: N/A;  Anterior Cervical Discectomy Fusion - Cervical Four- Cervical Five   APPENDECTOMY  1970   CARDIOVASCULAR STRESS TEST  03/22/09   CYSTOSCOPY WITH BIOPSY N/A 02/10/2013   Procedure: CYSTOSCOPY WITH COLD CUP BIOPSY/FULGERATION;  Surgeon: Oscar GORMAN Fragmin, MD;  Location: [facility];  Service:  Urology;  Laterality: N/A;  ALSO FULGERATION    INGUINAL HERNIA REPAIR Right 07-14-2002   INTRAMEDULLARY (IM) NAIL INTERTROCHANTERIC Right 12/12/2023   Procedure: FIXATION, FRACTURE, INTERTROCHANTERIC, WITH INTRAMEDULLARY ROD;  Surgeon: Oscar [name] Franky SQUIBB, MD;  Location: [facility] OR;  Service: Orthopedics;  Laterality: Right;   LEFT  HEART CATH AND CORONARY ANGIOGRAPHY N/A 07/13/2019   Procedure: LEFT HEART CATH AND CORONARY ANGIOGRAPHY;  Surgeon: Burnard Debby LABOR, MD;  Location: [facility] INVASIVE CV LAB;  Service: Cardiovascular;  Laterality: N/A;   LOOP RECORDER IMPLANT N/A 05/24/2014   Procedure: LOOP RECORDER IMPLANT;  Surgeon: Oscar Balding, MD;  Location: [facility] CATH LAB;  Service: Cardiovascular;  Laterality: N/A;   LOOP RECORDER REMOVAL N/A 10/02/2020   Procedure: LOOP RECORDER REMOVAL;  Surgeon: [name] Jerel, MD;  Location: [facility];  Service: Cardiovascular;  Laterality: N/A;   NECK SURGERY     PACEMAKER IMPLANT N/A 10/02/2020   Procedure: PACEMAKER IMPLANT;  Surgeon: [name] Jerel, MD;  Location: [facility];  Service: Cardiovascular;  Laterality: N/A;   TONSILLECTOMY     TRANSURETHRAL RESECTION OF BLADDER TUMOR  03-10-2008   TRANSURETHRAL RESECTION OF BLADDER TUMOR Bilateral 12/28/2020   Procedure: TRANSURETHRAL RESECTION OF BLADDER TUMOR BLADDER BIOPSY FULGARATION BILATERAL RETROGRADE PYELOGRAM(TURBT);  Surgeon: Oscar Morene ORN, MD;  Location: [facility]S;  Service: Urology;  Laterality: Bilateral;   TUMOR EXCISION Left    Left arm   Social History   Socioeconomic History   Marital status: Married    Spouse name: Not on file   Number of children: Not on file   Years of education: Not on file   Highest education level: Not on file  Occupational History   Occupation: retired  Tobacco Use   Smoking status: Former    Current packs/day: 0.00    Average packs/day: 0.5 packs/day for 30.0 years (15.0 ttl pk-yrs)    Types: Cigarettes    Start date: 02/05/1949    Quit date: 02/06/1979    Years since quitting: 45.1   Smokeless tobacco: Never  Vaping Use   Vaping status: Never Used  Substance and Sexual Activity   Alcohol use: Yes    Comment: occ   Drug use: No   Sexual activity: Not on file  Other Topics Concern   Not on file  Social History Narrative   Not on file   Social Drivers of Health    Financial Resource Strain: Not on file  Food Insecurity: No Food Insecurity (03/28/2024)   Hunger Vital Sign    Worried About Running Out of Food in the Last Year: Never true    Ran Out of Food in the Last Year: Never true  Transportation Needs: No Transportation Needs (03/28/2024)   PRAPARE - Administrator, Civil Service (Medical): No    Lack of Transportation (Non-Medical): No  Physical Activity: Not on file  Stress: Not on file  Social Connections: Socially Integrated (03/28/2024)   Social Connection and Isolation Panel    Frequency of Communication with Friends and Family: More than three times a week    Frequency of Social Gatherings with Friends and Family: More than three times a week    Attends Religious Services: 1 to 4 times per year    Active Member of [employer] or Organizations: No    Attends Engineer, structural: More than 4 times per year    Marital Status: Married   Family History  Problem Relation Age of Onset   Heart failure  Mother    Stroke Father    Diabetes Brother    Heart failure Brother    No Known Allergies   Positive ROS: All other systems have been reviewed and were otherwise negative with the exception of those mentioned in the HPI and as above.  Physical Exam: General: Alert, no acute distress Cardiovascular: No pedal edema Respiratory: No cyanosis, no use of accessory musculature Skin: No lesions in the area of chief complaint Neurologic: Sensation intact distally Psychiatric: Patient is competent for consent with normal mood and affect  MUSCULOSKELETAL:  LLE No traumatic wounds, ecchymosis, or rash  Nontender   groin pain with log roll  No knee or ankle effusion  Knee stable to varus/ valgus stress  Sens DPN, SPN, TN intact  Motor EHL, ext, flex 5/5  DP 2+, PT 2+, No significant edema   IMAGING: X-rays CT scan left hip reviewed demonstrate valgus impacted left femoral neck fracture  Assessment: Principal Problem:    Closed displaced fracture of left femoral neck (HCC) Active Problems:   Ground-level fall  Valgus impacted left femoral neck fracture 03/27/2024   Plan: Discussed with the patient this morning that he has what appears to be a stable femoral neck fracture.  Treatment options include cannulated screw fixation versus hemiarthroplasty.  Discussed that cannulated screw fixation although less invasive does have a potential risk of AVN or posttraumatic arthritis which could result in return to OR for conversion to a hip arthroplasty.  Risks of hemiarthroplasty were also discussed.  Ultimately I think patient would do well with cannulated screw fixation and will plan for this today.  The risks benefits and alternatives were discussed with the patient including but not limited to the risks of nonoperative treatment, versus surgical intervention including infection, bleeding, nerve injury, malunion, nonunion, the need for revision surgery, hardware prominence, hardware failure, the need for hardware removal, blood clots, cardiopulmonary complications, morbidity, mortality, among others, and they were willing to proceed.     TORIBIO DELENA HIGASHI, MD  Contact information:   [id number] 7am-5pm epic message Dr. HIGASHI, or call office for patient follow up: [phone] After hours and holidays please check [email or web address] for group call information for [facility]

## 2024-03-28 NOTE — Anesthesia Preprocedure Evaluation (Signed)
 Anesthesia Evaluation  Patient identified by MRN, date of birth, ID band Patient awake    Reviewed: Allergy & Precautions, H&P , NPO status , Patient's Chart, lab work & pertinent test results  Airway Mallampati: II   Neck ROM: full    Dental   Pulmonary COPD, former smoker   breath sounds clear to auscultation       Cardiovascular hypertension, + CAD  + dysrhythmias + pacemaker  Rhythm:regular Rate:Normal  H/o complete heart block   Neuro/Psych CVA    GI/Hepatic ,GERD  ,,  Endo/Other  Hypothyroidism    Renal/GU      Musculoskeletal   Abdominal   Peds  Hematology   Anesthesia Other Findings   Reproductive/Obstetrics                              Anesthesia Physical Anesthesia Plan  ASA: 3  Anesthesia Plan: General   Post-op Pain Management:    Induction: Intravenous  PONV Risk Score and Plan: 2 and Ondansetron , Dexamethasone  and Treatment may vary due to age or medical condition  Airway Management Planned: Oral ETT  Additional Equipment:   Intra-op Plan:   Post-operative Plan: Extubation in OR  Informed Consent: I have reviewed the patients History and Physical, chart, labs and discussed the procedure including the risks, benefits and alternatives for the proposed anesthesia with the patient or authorized representative who has indicated his/her understanding and acceptance.     Dental advisory given  Plan Discussed with: CRNA, Anesthesiologist and Surgeon  Anesthesia Plan Comments:         Anesthesia Quick Evaluation

## 2024-03-29 DIAGNOSIS — S72002A Fracture of unspecified part of neck of left femur, initial encounter for closed fracture: Secondary | ICD-10-CM | POA: Diagnosis not present

## 2024-03-29 LAB — BASIC METABOLIC PANEL WITH GFR
Anion gap: 9 (ref 5–15)
BUN: 22 mg/dL (ref 8–23)
CO2: 24 mmol/L (ref 22–32)
Calcium: 8.8 mg/dL — ABNORMAL LOW (ref 8.9–10.3)
Chloride: 103 mmol/L (ref 98–111)
Creatinine, Ser: 1.16 mg/dL (ref 0.61–1.24)
GFR, Estimated: 59 mL/min — ABNORMAL LOW (ref >60)
Glucose, Bld: 108 mg/dL — ABNORMAL HIGH (ref 70–99)
Potassium: 4.8 mmol/L (ref 3.5–5.1)
Sodium: 136 mmol/L (ref 135–145)

## 2024-03-29 LAB — CBC
HCT: 35.8 % — ABNORMAL LOW (ref 39.0–52.0)
Hemoglobin: 11.3 g/dL — ABNORMAL LOW (ref 13.0–17.0)
MCH: 28.3 pg (ref 26.0–34.0)
MCHC: 31.6 g/dL (ref 30.0–36.0)
MCV: 89.7 fL (ref 80.0–100.0)
Platelets: 203 K/uL (ref 150–400)
RBC: 3.99 MIL/uL — ABNORMAL LOW (ref 4.22–5.81)
RDW: 15.7 % — ABNORMAL HIGH (ref 11.5–15.5)
WBC: 10.9 K/uL — ABNORMAL HIGH (ref 4.0–10.5)
nRBC: 0 % (ref 0.0–0.2)

## 2024-03-29 MED ORDER — ASPIRIN 325 MG PO TBEC
325.0000 mg | DELAYED_RELEASE_TABLET | Freq: Every day | ORAL | Status: DC
  Administered 2024-03-29 – 2024-03-31 (×3): 325 mg via ORAL
  Filled 2024-03-29 (×3): qty 1

## 2024-03-29 MED ORDER — FUROSEMIDE 20 MG PO TABS
20.0000 mg | ORAL_TABLET | Freq: Every day | ORAL | Status: DC
  Administered 2024-03-29 – 2024-03-31 (×3): 20 mg via ORAL
  Filled 2024-03-29 (×3): qty 1

## 2024-03-29 MED ORDER — FINASTERIDE 5 MG PO TABS
5.0000 mg | ORAL_TABLET | Freq: Every day | ORAL | Status: DC
  Administered 2024-03-30 – 2024-03-31 (×2): 5 mg via ORAL
  Filled 2024-03-29 (×3): qty 1

## 2024-03-29 NOTE — Progress Notes (Signed)
     Subjective: Patient reports pain as mild.  Did well overnight.  No concerns this morning.  Discussed plan for mobilization with physical therapy today.  Objective:   VITALS:   Vitals:   03/28/24 1609 03/28/24 2007 03/29/24 0611 03/29/24 0840  BP: 108/85 104/75 (!) 144/79 (!) 141/77  Pulse: 89 90 71 74  Resp: 18 17  16   Temp: 97.6 F (36.4 C) 98 F (36.7 C) 97.7 F (36.5 C) 97.7 F (36.5 C)  TempSrc: Oral Oral Oral   SpO2: 97% 95% 98% 99%    Sensation intact distally Intact pulses distally Dorsiflexion/Plantar flexion intact Incision: dressing C/D/I Compartment soft    Lab Results  Component Value Date   WBC 10.9 (H) 03/29/2024   HGB 11.3 (L) 03/29/2024   HCT 35.8 (L) 03/29/2024   MCV 89.7 03/29/2024   PLT 203 03/29/2024   BMET    Component Value Date/Time   NA 136 03/29/2024 0513   NA 138 05/14/2021 1135   K 4.8 03/29/2024 0513   CL 103 03/29/2024 0513   CO2 24 03/29/2024 0513   GLUCOSE 108 (H) 03/29/2024 0513   BUN 22 03/29/2024 0513   BUN 18 05/14/2021 1135   CREATININE 1.16 03/29/2024 0513   CALCIUM  8.8 (L) 03/29/2024 0513   EGFR 68 05/14/2021 1135   GFRNONAA 59 (L) 03/29/2024 0513    Xray: cannulated hip screws in good position no adverse features  Assessment/Plan: 1 Day Post-Op   Principal Problem:   Closed displaced fracture of left femoral neck (HCC) Active Problems:   Ground-level fall  S/p Left femoral neck fx 03/28/24  Post op recs: WB: WBAT LLE Abx: ancef  x23 hours post op Imaging: PACU xrays Dressing: keep intact until follow up, change PRN if soiled or saturated. DVT prophylaxis: lovenox  in house and dc on aspirin  Follow up: 2 weeks after surgery for a wound check with Dr. Edna at [facility].  Address: [address], [place], [place] [address]  Office Phone: [phone]    TORIBIO DELENA EDNA 03/29/2024, 8:42 AM   TORIBIO Edna, MD  Contact information:   [phone] 7am-5pm epic  message Dr. Edna, or call office for patient follow up: [phone] After hours and holidays please check [email or web address] for group call information for [facility]

## 2024-03-29 NOTE — Progress Notes (Signed)
 [facility] PROGRESS NOTE    Progress Note  Oscar [name]  FMW:[id number] DOB: 02-Oct-1930 DOA: 03/27/2024 PCP: Frann Mabel Mt, DO     Brief Narrative:   Oscar [name] is an 89 y.o. male past medical history of CVA, heart failure with preserved EF and a pacemaker with a recent orthostatic hypotension, hyperlipidemia recurrent falls, with urgent right hip fracture requiring ORIF comes in with a left femoral neck fracture   Assessment/Plan:   Closed displaced fracture of left femoral neck (HCC) Patient was placed n.p.o. aspirin  was held. Orthopedic surgery was consulted for possible ORIF on 03/29/2024. Lovenox  for now aspirin  upon DC Analgesics per orthopedics. PT eval is pending. Check orthostatics. Cont. on a bowel regimen.  Ground-level fall Check orthostatics. Continue to monitor on telemetry, twelve-lead EKG showed ventricular paced rhythm.  History of CVA: No longer taking statins continue, resume dose of aspirin .  Chronic diastolic dysfunction: Resume diuretic therapy.  Pacemaker: Noted.  Essential hypertension/orthostatic hypotension: Currently only on diuretic therapy.  Hyperlipidemia: Currently not on statin.  BPH: Continue current home regimen.  GERD: Continue PPI.    DVT prophylaxis: lovenox  Family Communication:none Status is: Inpatient Remains inpatient appropriate because: Acute left hip fracture    Code Status:     Code Status Orders  (From admission, onward)           Start     Ordered   03/28/24 0226  Full code  Continuous       Question:  By:  Answer:  Other   03/28/24 0226           Code Status History     Date Active Date Inactive Code Status Order ID Comments User Context   12/12/2023 0223 12/17/2023 1605 Full Code [id number]  Charlton Evalene RAMAN, MD ED   10/02/2020 1547 10/02/2020 2229 Full Code [id number]  Francyne Headland, MD Inpatient   03/12/2020 0928 03/14/2020 1957 DNR [id number]  Barbarann Nest, MD ED    07/13/2019 1032 07/13/2019 1630 Full Code [id number]  Burnard Debby LABOR, MD Inpatient   01/27/2018 1125 01/28/2018 0216 Full Code [id number]  Louis Shove, MD Inpatient         IV Access:   Peripheral IV   Procedures and diagnostic studies:   DG HIP UNILAT WITH PELVIS 2-3 VIEWS LEFT Result Date: 03/28/2024 CLINICAL DATA:  Subcapital left femoral neck fracture status post ORIF EXAM: DG HIP (WITH OR WITHOUT PELVIS) 2-3V LEFT COMPARISON:  03/27/2024 FINDINGS: Frontal view of the pelvis as well as frontal and cross-table lateral views of the left hip are obtained. Chronic postsurgical changes from prior right hip ORIF. Interval placement of 3 cannulated screws traversing the acute subcapital left femoral neck fracture, with near anatomic alignment of the fracture site. Remainder of the bony pelvis is unremarkable. IMPRESSION: 1. Interval pinning of the subcapital left femoral neck fracture, with near anatomic alignment. Electronically Signed   By: Ozell Daring M.D.   On: 03/28/2024 13:10   DG HIP UNILAT WITH PELVIS 2-3 VIEWS LEFT Result Date: 03/28/2024 CLINICAL DATA:  Left hip fracture repair EXAM: DG HIP (WITH OR WITHOUT PELVIS) 2-3V LEFT COMPARISON:  03/27/2024 FINDINGS: 6 fluoroscopic images are obtained during the performance of the procedure and are provided for interpretation only. Three cannulated screws are seen traversing the subcapital left femoral neck fracture, with near anatomic alignment of the fracture site. Please refer to the operative report. Fluoroscopy time: 58 seconds, 8.89 mGy IMPRESSION: 1. ORIF of a subcapital left femoral neck fracture  as above. Electronically Signed   By: Ozell Daring M.D.   On: 03/28/2024 13:09   DG C-Arm 1-60 Min-No Report Result Date: 03/28/2024 Fluoroscopy was utilized by the requesting physician.  No radiographic interpretation.   CT HIP LEFT WO CONTRAST Result Date: 03/27/2024 CLINICAL DATA:  Left hip fracture EXAM: CT OF THE LEFT HIP WITHOUT CONTRAST  TECHNIQUE: Multidetector CT imaging of the left hip was performed according to the standard protocol. Multiplanar CT image reconstructions were also generated. RADIATION DOSE REDUCTION: This exam was performed according to the departmental dose-optimization program which includes automated exposure control, adjustment of the mA and/or kV according to patient size and/or use of iterative reconstruction technique. COMPARISON:  Left hip radiographs 03/27/2024 FINDINGS: Bones/Joint/Cartilage Transverse fracture of the subcapital left femoral neck with impaction of the fracture fragments. Mild valgus angulation. No inter trochanteric involvement. No dislocation of the hip joint. Degenerative changes in the hip joint with osteophyte formation on both sides of the joint. No significant effusion. Ligaments Suboptimally assessed by CT. Muscles and Tendons No intramuscular mass. Small intramuscular hematoma suggested in the anterior iliac muscle. Soft tissues No significant soft tissue infiltration or collection. Visualized pelvic organs appear intact. IMPRESSION: 1. Transverse fracture of the subcapital left femoral neck with impaction and mild valgus angulation. No inter trochanteric involvement. 2. Small anterior intramuscular hematoma. Electronically Signed   By: Elsie Gravely M.D.   On: 03/27/2024 19:42   DG Chest 1 View Result Date: 03/27/2024 CLINICAL DATA:  Fall EXAM: CHEST  1 VIEW COMPARISON:  12/11/2023 FINDINGS: Left-sided pacing device as before. No acute airspace disease or effusion. Stable cardiomediastinal silhouette with aortic atherosclerosis. No pneumothorax IMPRESSION: No active disease. Electronically Signed   By: Luke Bun M.D.   On: 03/27/2024 19:03   DG HIP UNILAT WITH PELVIS 2-3 VIEWS LEFT Result Date: 03/27/2024 CLINICAL DATA:  Left hip pain since falling last night. EXAM: DG HIP (WITH OR WITHOUT PELVIS) 2-3V LEFT COMPARISON:  Pelvic and left hip radiographs 11/20/2004. Right hip  radiographs 12/12/2023. Pelvic CT 09/19/2021. FINDINGS: The bones are demineralized. New, presumably acute subcapital fracture of the left femoral neck with mild impaction. The proximal right femur has a stable appearance status post intramedullary nail and dynamic screw fixation of the previously demonstrated intertrochanteric fracture. No pelvic fracture or dislocation identified. Minimal hip degenerative changes for age. IMPRESSION: 1. New, presumably acute subcapital fracture of the left femoral neck. 2. Stable appearance of the proximal right femur status post ORIF. Electronically Signed   By: Elsie Perone M.D.   On: 03/27/2024 18:01   CT Head Wo Contrast Result Date: 03/27/2024 CLINICAL DATA:  Dizzy spell with fall this morning EXAM: CT HEAD WITHOUT CONTRAST CT CERVICAL SPINE WITHOUT CONTRAST TECHNIQUE: Multidetector CT imaging of the head and cervical spine was performed following the standard protocol without intravenous contrast. Multiplanar CT image reconstructions of the cervical spine were also generated. RADIATION DOSE REDUCTION: This exam was performed according to the departmental dose-optimization program which includes automated exposure control, adjustment of the mA and/or kV according to patient size and/or use of iterative reconstruction technique. COMPARISON:  None Available. FINDINGS: CT HEAD FINDINGS Brain: No evidence of acute infarction, hemorrhage, hydrocephalus, extra-axial collection or mass lesion/mass effect. Periventricular white matter hypodensity. Vascular: No hyperdense vessel or unexpected calcification. Skull: Normal. Negative for fracture or focal lesion. Sinuses/Orbits: No acute finding. Other: None. CT CERVICAL SPINE FINDINGS Alignment: Postoperative listhesis of C4-C5. Skull base and vertebrae: No acute fracture. No primary bone lesion or  focal pathologic process. Soft tissues and spinal canal: No prevertebral fluid or swelling. No visible canal hematoma. Disc levels:  Status post anterior cervical discectomy and fusion of C4-C5 with ankylosis of C3-C6 and severe disc degenerative change of C6-C7. Upper chest: Negative. Other: None. IMPRESSION: 1. No acute intracranial pathology. Small-vessel white matter disease in keeping with advanced patient age. 2. No fracture or static subluxation of the cervical spine. 3. Status post anterior cervical discectomy and fusion of C4-C5 with ankylosis of C3-C6 and severe disc degenerative change of C6-C7. Electronically Signed   By: Marolyn JONETTA Jaksch M.D.   On: 03/27/2024 17:59   CT Cervical Spine Wo Contrast Result Date: 03/27/2024 CLINICAL DATA:  Dizzy spell with fall this morning EXAM: CT HEAD WITHOUT CONTRAST CT CERVICAL SPINE WITHOUT CONTRAST TECHNIQUE: Multidetector CT imaging of the head and cervical spine was performed following the standard protocol without intravenous contrast. Multiplanar CT image reconstructions of the cervical spine were also generated. RADIATION DOSE REDUCTION: This exam was performed according to the departmental dose-optimization program which includes automated exposure control, adjustment of the mA and/or kV according to patient size and/or use of iterative reconstruction technique. COMPARISON:  None Available. FINDINGS: CT HEAD FINDINGS Brain: No evidence of acute infarction, hemorrhage, hydrocephalus, extra-axial collection or mass lesion/mass effect. Periventricular white matter hypodensity. Vascular: No hyperdense vessel or unexpected calcification. Skull: Normal. Negative for fracture or focal lesion. Sinuses/Orbits: No acute finding. Other: None. CT CERVICAL SPINE FINDINGS Alignment: Postoperative listhesis of C4-C5. Skull base and vertebrae: No acute fracture. No primary bone lesion or focal pathologic process. Soft tissues and spinal canal: No prevertebral fluid or swelling. No visible canal hematoma. Disc levels: Status post anterior cervical discectomy and fusion of C4-C5 with ankylosis of C3-C6 and  severe disc degenerative change of C6-C7. Upper chest: Negative. Other: None. IMPRESSION: 1. No acute intracranial pathology. Small-vessel white matter disease in keeping with advanced patient age. 2. No fracture or static subluxation of the cervical spine. 3. Status post anterior cervical discectomy and fusion of C4-C5 with ankylosis of C3-C6 and severe disc degenerative change of C6-C7. Electronically Signed   By: Marolyn JONETTA Jaksch M.D.   On: 03/27/2024 17:59     Medical Consultants:   None.   Subjective:    Oscar [name] pain is controlled.  Objective:    Vitals:   03/28/24 1609 03/28/24 2007 03/29/24 0611 03/29/24 0840  BP: 108/85 104/75 (!) 144/79 (!) 141/77  Pulse: 89 90 71 74  Resp: 18 17  16   Temp: 97.6 F (36.4 C) 98 F (36.7 C) 97.7 F (36.5 C) 97.7 F (36.5 C)  TempSrc: Oral Oral Oral   SpO2: 97% 95% 98% 99%   SpO2: 99 %   Intake/Output Summary (Last 24 hours) at 03/29/2024 1006 Last data filed at 03/28/2024 1900 Gross per 24 hour  Intake 300 ml  Output 700 ml  Net -400 ml   There were no vitals filed for this visit.  Exam: General exam: In no acute distress. Respiratory system: Good air movement and clear to auscultation. Cardiovascular system: S1 & S2 heard, RRR. No JVD. Gastrointestinal system: Abdomen is nondistended, soft and nontender.  Extremities: No pedal edema. Skin: No rashes, lesions or ulcers Psychiatry: Judgement and insight appear normal. Mood & affect appropriate.  Data Reviewed:    Labs: Basic Metabolic Panel: Recent Labs  Lab 03/27/24 1640 03/28/24 0507 03/29/24 0513  NA 138 137 136  K 4.4 4.1 4.8  CL 103 107 103  CO2 22  18* 24  GLUCOSE 103* 83 108*  BUN 23 16 22   CREATININE 1.03 1.04 1.16  CALCIUM  9.7 8.8* 8.8*  MG  --  2.0  --   PHOS  --  3.7  --    GFR Estimated Creatinine Clearance: 38 mL/min (by C-G formula based on SCr of 1.16 mg/dL). Liver Function Tests: Recent Labs  Lab 03/27/24 1640  AST 18  ALT 13   ALKPHOS 109  BILITOT 0.3  PROT 6.9  ALBUMIN  4.0   No results for input(s): LIPASE, AMYLASE in the last 168 hours. No results for input(s): AMMONIA in the last 168 hours. Coagulation profile Recent Labs  Lab 03/28/24 0507  INR 1.1   COVID-19 Labs  No results for input(s): DDIMER, FERRITIN, LDH, CRP in the last 72 hours.  Lab Results  Component Value Date   SARSCOV2NAA NEGATIVE 12/26/2020   SARSCOV2NAA NEGATIVE 09/28/2020   SARSCOV2NAA NEGATIVE 03/12/2020   SARSCOV2NAA NOT DETECTED 07/10/2019    CBC: Recent Labs  Lab 03/27/24 1640 03/28/24 0507 03/29/24 0513  WBC 7.6 5.7 10.9*  HGB 12.1* 10.7* 11.3*  HCT 38.1* 35.1* 35.8*  MCV 88.4 91.9 89.7  PLT 178 126* 203   Cardiac Enzymes: No results for input(s): CKTOTAL, CKMB, CKMBINDEX, TROPONINI in the last 168 hours. BNP (last 3 results) No results for input(s): PROBNP in the last 8760 hours. CBG: No results for input(s): GLUCAP in the last 168 hours. D-Dimer: No results for input(s): DDIMER in the last 72 hours. Hgb A1c: No results for input(s): HGBA1C in the last 72 hours. Lipid Profile: No results for input(s): CHOL, HDL, LDLCALC, TRIG, CHOLHDL, LDLDIRECT in the last 72 hours. Thyroid  function studies: No results for input(s): TSH, T4TOTAL, T3FREE, THYROIDAB in the last 72 hours.  Invalid input(s): FREET3 Anemia work up: No results for input(s): VITAMINB12, FOLATE, FERRITIN, TIBC, IRON, RETICCTPCT in the last 72 hours. Sepsis Labs: Recent Labs  Lab 03/27/24 1640 03/28/24 0507 03/29/24 0513  WBC 7.6 5.7 10.9*   Microbiology Recent Results (from the past 240 hours)  Surgical pcr screen     Status: None   Collection Time: 03/28/24  2:06 AM   Specimen: Nasal Mucosa; Nasal Swab  Result Value Ref Range Status   MRSA, PCR NEGATIVE NEGATIVE Final   Staphylococcus aureus NEGATIVE NEGATIVE Final    Comment: (NOTE) The Xpert SA Assay (FDA approved  for NASAL specimens in patients 72 years of age and older), is one component of a comprehensive surveillance program. It is not intended to diagnose infection nor to guide or monitor treatment. Performed at [facility], [address] [place], [place] [address]      Medications:    cholecalciferol   1,000 Units Oral Daily   enoxaparin  (LOVENOX ) injection  40 mg Subcutaneous Q24H   fentaNYL  (SUBLIMAZE ) injection  25 mcg Intravenous Once   melatonin  6 mg Oral QHS   ondansetron  (ZOFRAN ) IV  4 mg Intravenous Once   pantoprazole   40 mg Oral Daily   polyethylene glycol  17 g Oral BID   sodium chloride  flush  3 mL Intravenous Q12H   tamsulosin   0.4 mg Oral QPC supper   Continuous Infusions:    LOS: 1 day   Oscar [name]  [facility]  03/29/2024, 10:06 AM

## 2024-03-29 NOTE — Plan of Care (Signed)

## 2024-03-29 NOTE — Evaluation (Signed)
 Physical Therapy Evaluation  Patient Details Name: Oscar [name] MRN: [id number] DOB: [birth year] Today's Date: 03/29/2024  History of Present Illness  Pt is a 89 y/o male who presents s/p fall, sustaining a L femoral next fracture. He is now s/p cannulated screw fixation on 03/27/2024. PMH significant for Apneic episoide, centrilobular emphysema, CAD, CVA, HTN, DOE, bladder CA, concussion, LBBB, macular degeneration L eye, pacemaker, ACDF 2019, R IM nail.   Clinical Impression  Pt admitted with above diagnosis. Pt currently with functional limitations due to the deficits listed below (see PT Problem List). At the time of PT eval pt was able to perform transfers with up to +2 assist and RW for support. Orthostatics taken during session, see below for details. Pt will benefit from acute skilled PT to increase their independence and safety with mobility to allow discharge.       Orthostatic BPs  Supine 98/68  Sitting 113/67  Standing 135/77         If plan is discharge home, recommend the following: A little help with walking and/or transfers;A little help with bathing/dressing/bathroom;Two people to help with bathing/dressing/bathroom;Assist for transportation;Help with stairs or ramp for entrance   Can travel by private vehicle   Yes    Equipment Recommendations None recommended by PT  Recommendations for Other Services       Functional Status Assessment Patient has had a recent decline in their functional status and demonstrates the ability to make significant improvements in function in a reasonable and predictable amount of time.     Precautions / Restrictions Precautions Precautions: Fall Recall of Precautions/Restrictions: Intact Restrictions Weight Bearing Restrictions Per Provider Order: Yes LLE Weight Bearing Per Provider Order: Weight bearing as tolerated      Mobility  Bed Mobility Overal bed mobility: Needs Assistance Bed Mobility: Supine to Sit     Supine to  sit: Contact guard     General bed mobility comments: Pt assisting his own LLE over towards EOB.Close guard at trunk.    Transfers Overall transfer level: Needs assistance Equipment used: Rolling walker (2 wheels) Transfers: Sit to/from Stand, Bed to chair/wheelchair/BSC Sit to Stand: Min assist, +2 physical assistance   Step pivot transfers: Min assist, +2 safety/equipment       General transfer comment: VC's for hand placement on seated surface for safety. Pt able to take steps from bed to chair with assist for walker management and balance support.    Ambulation/Gait               General Gait Details: Did not progress to ambulation at this time.  Stairs            Wheelchair Mobility     Tilt Bed    Modified Rankin (Stroke Patients Only)       Balance Overall balance assessment: Needs assistance Sitting-balance support: Feet supported, No upper extremity supported Sitting balance-Leahy Scale: Fair     Standing balance support: Bilateral upper extremity supported, During functional activity, Reliant on assistive device for balance Standing balance-Leahy Scale: Poor                               Pertinent Vitals/Pain Pain Assessment Pain Assessment: Faces Faces Pain Scale: Hurts little more Pain Location: L hip Pain Descriptors / Indicators: Operative site guarding, Sore Pain Intervention(s): Limited activity within patient's tolerance, Monitored during session, Repositioned    Home Living Family/patient expects to be discharged to::  Private residence Living Arrangements: Spouse/significant other Available Help at Discharge: Family;Available 24 hours/day Type of Home: Independent living facility (Apartment set up) Home Access: Level entry       Home Layout: One level Home Equipment: Shower seat - built Char[employer] fundraiser (2 wheels);Cane - single point;Grab bars - toilet;Grab bars - tub/shower;Hand held shower head Additional  Comments: Pt lives independent living and helps care for his wife with dementia. Pt has dog that gets under his feet.  All meals are in a dining room. Pt has someone clean apartement 1x week.    Prior Function Prior Level of Function : Independent/Modified Independent             Mobility Comments: Typically using a cane. ADLs Comments: Does not cook any meals. Can have meals brought in if needed. Typically goes down to the dining room.     Extremity/Trunk Assessment   Upper Extremity Assessment Upper Extremity Assessment: Overall WFL for tasks assessed    Lower Extremity Assessment Lower Extremity Assessment: LLE deficits/detail LLE Deficits / Details: Acute pain, decreased strength and AROM consistent with pre-op diagnosis    Cervical / Trunk Assessment Cervical / Trunk Assessment: Other exceptions Cervical / Trunk Exceptions: Forward head posture with rounded shoulders  Communication   Communication Communication: No apparent difficulties    Cognition Arousal: Alert Behavior During Therapy: WFL for tasks assessed/performed   PT - Cognitive impairments: No apparent impairments                                 Cueing       General Comments      Exercises General Exercises - Lower Extremity Ankle Circles/Pumps: 10 reps Long Arc Quad: 10 reps Heel Slides: 5 reps   Assessment/Plan    PT Assessment Patient needs continued PT services  PT Problem List Decreased strength;Decreased activity tolerance;Decreased balance;Decreased mobility;Decreased knowledge of precautions;Decreased safety awareness;Decreased knowledge of use of DME;Pain       PT Treatment Interventions DME instruction;Gait training;Functional mobility training;Therapeutic activities;Therapeutic exercise;Balance training;Patient/family education    PT Goals (Current goals can be found in the Care Plan section)  Acute Rehab PT Goals Patient Stated Goal: Eventually back home with  wife PT Goal Formulation: With patient Time For Goal Achievement: 04/05/24 Potential to Achieve Goals: Good    Frequency Min 2X/week     Co-evaluation               AM-PAC PT 6 Clicks Mobility  Outcome Measure Help needed turning from your back to your side while in a flat bed without using bedrails?: A Little Help needed moving from lying on your back to sitting on the side of a flat bed without using bedrails?: A Little Help needed moving to and from a bed to a chair (including a wheelchair)?: A Lot Help needed standing up from a chair using your arms (e.g., wheelchair or bedside chair)?: A Lot Help needed to walk in hospital room?: A Lot Help needed climbing 3-5 steps with a railing? : Total 6 Click Score: 13    End of Session Equipment Utilized During Treatment: Gait belt Activity Tolerance: Patient tolerated treatment well Patient left: in chair;with call bell/phone within reach;with chair alarm set Nurse Communication: Mobility status PT Visit Diagnosis: Unsteadiness on feet (R26.81);Pain Pain - Right/Left: Left Pain - part of body: Hip    Time: 1410-1435 PT Time Calculation (min) (ACUTE ONLY): 25 min  Charges:   PT Evaluation $PT Eval Moderate Complexity: 1 Mod PT Treatments $Gait Training: 8-22 mins PT General Charges $$ ACUTE PT VISIT: 1 Visit         Leita Sable, PT, DPT Acute Rehabilitation Services Secure Chat Preferred Office: [phone]   Leita JONETTA Sable 03/29/2024, 4:13 PM

## 2024-03-29 NOTE — Plan of Care (Signed)
  Problem: Health Behavior/Discharge Planning: Goal: Ability to manage health-related needs will improve Outcome: Progressing   Problem: Nutrition: Goal: Adequate nutrition will be maintained Outcome: Progressing   Problem: Elimination: Goal: Will not experience complications related to urinary retention Outcome: Progressing   Problem: Pain Managment: Goal: General experience of comfort will improve and/or be controlled Outcome: Progressing   Problem: Safety: Goal: Ability to remain free from injury will improve Outcome: Progressing   Problem: Skin Integrity: Goal: Risk for impaired skin integrity will decrease Outcome: Progressing

## 2024-03-30 ENCOUNTER — Encounter (HOSPITAL_COMMUNITY): Payer: Self-pay | Admitting: Orthopedic Surgery

## 2024-03-30 DIAGNOSIS — S72002A Fracture of unspecified part of neck of left femur, initial encounter for closed fracture: Secondary | ICD-10-CM | POA: Diagnosis not present

## 2024-03-30 MED ORDER — ASPIRIN 81 MG PO TBEC
81.0000 mg | DELAYED_RELEASE_TABLET | Freq: Two times a day (BID) | ORAL | Status: AC
Start: 2024-03-31 — End: 2024-04-28

## 2024-03-30 MED ORDER — MIDODRINE HCL 5 MG PO TABS: 2.5000 mg | ORAL_TABLET | Freq: Three times a day (TID) | ORAL | Status: DC

## 2024-03-30 MED ORDER — OXYCODONE HCL 5 MG PO TABS: 2.5000 mg | ORAL_TABLET | ORAL | 0 refills | Status: DC | PRN

## 2024-03-30 NOTE — Plan of Care (Signed)

## 2024-03-30 NOTE — Progress Notes (Signed)
 Transition of Care [facility]) - CAGE-AID Screening   Patient Details  Name: Oscar [name] MRN: [id number] Date of Birth: 05-24-1931  [facility] CM/SW Contact:    Bernardino Mayotte, RN Phone Number: 03/30/2024, 9:15 PM   Clinical Narrative:  Patient endorses alcohol use, denies illicit drug use. Resources not given at this time.  CAGE-AID Screening:    Have You Ever Felt You Ought to Cut Down on Your Drinking or Drug Use?: No Have People Annoyed You By Critizing Your Drinking Or Drug Use?: No Have You Felt Bad Or Guilty About Your Drinking Or Drug Use?: No Have You Ever Had a Drink or Used Drugs First Thing In The Morning to Steady Your Nerves or to Get Rid of a Hangover?: No CAGE-AID Score: 0  Substance Abuse Education Offered: No

## 2024-03-30 NOTE — Op Note (Signed)
 03/28/2024   11:02 AM   PATIENT:  Oscar [name]     PRE-OPERATIVE DIAGNOSIS:  Left valgus impacted femur Neck Fracture   POST-OPERATIVE DIAGNOSIS:  Same   PROCEDURE: Cannulated screw fixation left femoral neck   SURGEON:  Briget Shaheed A Donney Caraveo, MD   PHYSICIAN ASSISTANT: Bernarda Mclean, PA-C was present and scrubbed throughout the case, critical for completion in a timely fashion, and for retraction, instrumentation, and closure.   ANESTHESIA:   General   ESTIMATED BLOOD LOSS: 50cc.   PREOPERATIVE INDICATIONS:  Oscar [name] is a  89 y.o. male who fell and was found to have a diagnosis of a valgus impacted left femoral neck fracture who elected for surgical management.     The risks benefits and alternatives were discussed with the patient preoperatively including but not limited to the risks of infection, bleeding, nerve injury, cardiopulmonary complications, blood clots, malunion, nonunion, avascular necrosis, the need for revision surgery, the potential for conversion to hemiarthroplasty, among others, and the patient was willing to proceed.   OPERATIVE IMPLANTS: 8.1mm cannulated screws x3 with 1 washer on superior anterior screw   OPERATIVE PROCEDURE: The patient was brought to the operating room and placed in supine position. IV antibiotics were given. General anesthesia administered. Foley was also given. The patient was placed on the fracture table. The operative extremity was positioned, without any significant reduction maneuver and was prepped and draped in usual sterile fashion.  Time out was performed.   Small incision was made distal to the greater trochanter, and 3 guidewires were introduced Into an inverted triangle configuration. The lengths were measured. The reduction was slightly valgus, and near-anatomic. I opened the cortex with a cannulated drill, and then placed the screws into position. Satisfactory fixation was achieved.   The wounds were irrigated copiously,  and repaired with Vicryl and monocryl. Dermabond and mepilex dressing. There no complications and the patient tolerated the procedure well.   Post op recs: WB: WBAT LLE Abx: ancef  x23 hours post op Imaging: PACU xrays Dressing: keep intact until follow up, change PRN if soiled or saturated. DVT prophylaxis: lovenox  in house and dc on aspirin  Follow up: 2 weeks after surgery for a wound check with Dr. Edna at [facility].  Address: [address], [place], [place] [address]  Office Phone: [phone]

## 2024-03-30 NOTE — Progress Notes (Signed)
    2 Days Post-Op Procedure(s) (LRB): FIXATION, FEMUR, NECK, PERCUTANEOUS, USING SCREW (Left)  Subjective: Patient reports pain as mild.  Stood with PT but yet to ambulate.  Pt hopeful today.  Denies numbness or tingling to leg.  No overnight events and no concerns this morning.    Objective:   VITALS:   Vitals:   03/29/24 0840 03/29/24 1403 03/29/24 1958 03/30/24 0546  BP: (!) 141/77 (!) 104/58 105/69 (!) 144/75  Pulse: 74 79 72 64  Resp: 16 18  16   Temp: 97.7 F (36.5 C) 97.7 F (36.5 C) 98 F (36.7 C) (!) 97.3 F (36.3 C)  TempSrc:  Oral Oral Oral  SpO2: 99% 94% 95% 98%    Sitting in bed comfortably in NAD Sensation intact distally Intact pulses distally Dorsiflexion/Plantar flexion intact Incision: dressing C/D/I Compartment soft Wiggles toes appropriately   Lab Results  Component Value Date   WBC 10.9 (H) 03/29/2024   HGB 11.3 (L) 03/29/2024   HCT 35.8 (L) 03/29/2024   MCV 89.7 03/29/2024   PLT 203 03/29/2024   BMET    Component Value Date/Time   NA 136 03/29/2024 0513   NA 138 05/14/2021 1135   K 4.8 03/29/2024 0513   CL 103 03/29/2024 0513   CO2 24 03/29/2024 0513   GLUCOSE 108 (H) 03/29/2024 0513   BUN 22 03/29/2024 0513   BUN 18 05/14/2021 1135   CREATININE 1.16 03/29/2024 0513   CALCIUM  8.8 (L) 03/29/2024 0513   EGFR 68 05/14/2021 1135   GFRNONAA 59 (L) 03/29/2024 0513    Xray: cannulated hip screws in good position no adverse features  Assessment/Plan: 2 Days Post-Op   Principal Problem:   Closed displaced fracture of left femoral neck (HCC) Active Problems:   Ground-level fall  S/p Left femoral neck fx 03/28/24  Post op recs: WB: WBAT LLE Abx: ancef  x23 hours post op Imaging: PACU xrays Dressing: keep intact until follow up, change PRN if soiled or saturated. DVT prophylaxis: lovenox  in house and dc on aspirin  Follow up: 2 weeks after surgery for a wound check with Dr. Edna at [facility].  Address: [address], [place], [place] [address]  Office Phone: [phone]    Oscar [name] 03/30/2024, 7:05 AM     Contact information:   Weekdays 7am-5pm epic message Dr. Edna, or call office for patient follow up: [phone] After hours and holidays please check [email or web address] for group call information for [facility]

## 2024-03-30 NOTE — Progress Notes (Signed)
 CSW spoke with Alfonso Holms, social work navigator who works with this pt and provided background info: Pt daughter is POA but lives in [place].  Pt is from [place] independent [facility].  Pt is caregiver to his wife but they do have HH aide in place 24/7 as well.  Discussed still waiting on PT recs, if the rec is for SNF will probably need to move forward with this.  Alfonso can assist with DC planning/communication as well. Cathlyn Ferry, MSW, LCSW 7/8/20251:32 PM

## 2024-03-30 NOTE — Anesthesia Postprocedure Evaluation (Signed)
 Anesthesia Post Note  Patient: Oscar [name]  Procedure(s) Performed: FIXATION, FEMUR, NECK, PERCUTANEOUS, USING SCREW (Left)     Patient location during evaluation: PACU Anesthesia Type: General Level of consciousness: awake and alert Pain management: pain level controlled Vital Signs Assessment: post-procedure vital signs reviewed and stable Respiratory status: spontaneous breathing, nonlabored ventilation, respiratory function stable and patient connected to nasal cannula oxygen Cardiovascular status: blood pressure returned to baseline and stable Postop Assessment: no apparent nausea or vomiting Anesthetic complications: no   No notable events documented.  Last Vitals:  Vitals:   03/30/24 0546 03/30/24 0725  BP: (!) 144/75 124/72  Pulse: 64 80  Resp: 16 18  Temp: (!) 36.3 C 36.5 C  SpO2: 98% 98%    Last Pain:  Vitals:   03/30/24 0857  TempSrc:   PainSc: 0-No pain                 Syvanna Ciolino S

## 2024-03-30 NOTE — Plan of Care (Signed)
  Problem: Clinical Measurements: Goal: Ability to maintain clinical measurements within normal limits will improve Outcome: Progressing Goal: Will remain free from infection Outcome: Progressing   Problem: Coping: Goal: Level of anxiety will decrease Outcome: Progressing   Problem: Elimination: Goal: Will not experience complications related to bowel motility Outcome: Progressing

## 2024-03-30 NOTE — Progress Notes (Signed)
 Physical Therapy Treatment  Patient Details Name: Oscar [name] MRN: [id number] DOB: [birth year] Today's Date: 03/30/2024   History of Present Illness Pt is a 89 y/o male who presents s/p fall, sustaining a L femoral next fracture. He is now s/p cannulated screw fixation on 03/27/2024. PMH significant for Apneic episoide, centrilobular emphysema, CAD, CVA, HTN, DOE, bladder CA, concussion, LBBB, macular degeneration L eye, pacemaker, ACDF 2019, R IM nail.    PT Comments  Pt progressing well towards physical therapy goals. He is motivated to work with PT. Pt was issued HEP and reviewed seated/supine exercises. Pt able to progress gait training to a step-through gait pattern and was overall steady with RW for support. Pt reports he would like to return home at d/c. He states he will have good support available and is open to HHPT follow up. I feel it is reasonable for this patient to return home given his progress and frequent staff support at his independent living facility. CSW updated after session. Will continue to follow.    If plan is discharge home, recommend the following: A little help with walking and/or transfers;A little help with bathing/dressing/bathroom;Two people to help with bathing/dressing/bathroom;Assist for transportation;Help with stairs or ramp for entrance   Can travel by private vehicle     Yes  Equipment Recommendations  None recommended by PT    Recommendations for Other Services       Precautions / Restrictions Precautions Precautions: Fall Recall of Precautions/Restrictions: Intact Restrictions Weight Bearing Restrictions Per Provider Order: Yes LLE Weight Bearing Per Provider Order: Weight bearing as tolerated     Mobility  Bed Mobility Overal bed mobility: Modified Independent Bed Mobility: Supine to Sit           General bed mobility comments: No assist required for transition to EOB. Increased time.    Transfers Overall transfer level: Needs  assistance Equipment used: Rolling walker (2 wheels) Transfers: Sit to/from Stand Sit to Stand: Supervision           General transfer comment: Pt demonstrated good hand placement on seated surface for safety. No assist required.    Ambulation/Gait Ambulation/Gait assistance: Contact guard assist, Supervision Gait Distance (Feet): 120 Feet Assistive device: Rolling walker (2 wheels) Gait Pattern/deviations: Step-through pattern, Step-to pattern, Decreased stride length, Trunk flexed Gait velocity: Decreased Gait velocity interpretation: 1.31 - 2.62 ft/sec, indicative of limited community ambulator   General Gait Details: Poor posture but pt able to progress to step-through gait pattern. Slow but generally steady with RW for support.   Stairs             Wheelchair Mobility     Tilt Bed    Modified Rankin (Stroke Patients Only)       Balance Overall balance assessment: Needs assistance Sitting-balance support: Feet supported, No upper extremity supported Sitting balance-Leahy Scale: Fair     Standing balance support: Bilateral upper extremity supported, During functional activity, Reliant on assistive device for balance Standing balance-Leahy Scale: Poor                              Communication Communication Communication: No apparent difficulties  Cognition Arousal: Alert Behavior During Therapy: WFL for tasks assessed/performed   PT - Cognitive impairments: No apparent impairments                                Cueing  Exercises General Exercises - Lower Extremity Ankle Circles/Pumps: 10 reps Quad Sets: 10 reps Short Arc Quad: 10 reps Long Arc Quad: 10 reps Heel Slides: 10 reps Hip ABduction/ADduction: 10 reps    General Comments        Pertinent Vitals/Pain Pain Assessment Pain Assessment: Faces Faces Pain Scale: Hurts a little bit Pain Location: L hip Pain Descriptors / Indicators: Operative site guarding,  Sore Pain Intervention(s): Limited activity within patient's tolerance, Monitored during session, Repositioned    Home Living                          Prior Function            PT Goals (current goals can now be found in the care plan section) Acute Rehab PT Goals Patient Stated Goal: Home at d/c PT Goal Formulation: With patient Time For Goal Achievement: 04/05/24 Potential to Achieve Goals: Good Progress towards PT goals: Progressing toward goals    Frequency    Min 2X/week      PT Plan      Co-evaluation              AM-PAC PT 6 Clicks Mobility   Outcome Measure  Help needed turning from your back to your side while in a flat bed without using bedrails?: None Help needed moving from lying on your back to sitting on the side of a flat bed without using bedrails?: None Help needed moving to and from a bed to a chair (including a wheelchair)?: A Little Help needed standing up from a chair using your arms (e.g., wheelchair or bedside chair)?: A Little Help needed to walk in hospital room?: A Little Help needed climbing 3-5 steps with a railing? : A Lot 6 Click Score: 19    End of Session Equipment Utilized During Treatment: Gait belt Activity Tolerance: Patient tolerated treatment well Patient left: in chair;with call bell/phone within reach;with chair alarm set Nurse Communication: Mobility status PT Visit Diagnosis: Unsteadiness on feet (R26.81);Pain Pain - Right/Left: Left Pain - part of body: Hip     Time: 1445-1515 PT Time Calculation (min) (ACUTE ONLY): 30 min  Charges:    $Gait Training: 8-22 mins $Therapeutic Exercise: 8-22 mins PT General Charges $$ ACUTE PT VISIT: 1 Visit                     Oscar [name], PT, DPT Acute Rehabilitation Services Secure Chat Preferred Office: [phone]    Oscar [name] 03/30/2024, 3:44 PM

## 2024-03-30 NOTE — Progress Notes (Signed)
 [facility] PROGRESS NOTE    Progress Note  Oscar [name]  FMW:[id number] DOB: 11/01/1930 DOA: 03/27/2024 PCP: Frann Mabel Mt, DO     Brief Narrative:   Oscar [name] is an 89 y.o. male past medical history of CVA, heart failure with preserved EF and a pacemaker with a recent orthostatic hypotension, hyperlipidemia recurrent falls, with urgent right hip fracture requiring ORIF comes in with a left femoral neck fracture.  Assessment/Plan:   Closed displaced fracture of left femoral neck (HCC) Orthopedic surgery status post ORIF on 03/29/2024. Lovenox  for now aspirin  upon DC 160 mg for 2 weeks. Continue narcotics for pain control. PT OT evaluated the patient will need skilled nursing facility. Orthostatic vitals were negative.  Ground-level fall Check orthostatics. Continue to monitor on telemetry, twelve-lead EKG showed ventricular paced rhythm.  History of CVA: No longer taking statins continue, continue aspirin .  Chronic diastolic dysfunction: Resume diuretic therapy.  Pacemaker: Noted.  Essential hypertension/orthostatic hypotension: Currently only on diuretic therapy.  Hyperlipidemia: Currently not on statin.  BPH: Continue current home regimen.  GERD: Continue PPI.    DVT prophylaxis: lovenox  Family Communication:none Status is: Inpatient Remains inpatient appropriate because: Acute left hip fracture    Code Status:     Code Status Orders  (From admission, onward)           Start     Ordered   03/28/24 0226  Full code  Continuous       Question:  By:  Answer:  Other   03/28/24 0226           Code Status History     Date Active Date Inactive Code Status Order ID Comments User Context   12/12/2023 0223 12/17/2023 1605 Full Code [id number]  Charlton Evalene RAMAN, MD ED   10/02/2020 1547 10/02/2020 2229 Full Code [id number]  Francyne Headland, MD Inpatient   03/12/2020 0928 03/14/2020 1957 DNR [id number]  Barbarann Nest, MD ED    07/13/2019 1032 07/13/2019 1630 Full Code [id number]  Burnard Debby LABOR, MD Inpatient   01/27/2018 1125 01/28/2018 0216 Full Code [id number]  Louis Shove, MD Inpatient         IV Access:   Peripheral IV   Procedures and diagnostic studies:   DG HIP UNILAT WITH PELVIS 2-3 VIEWS LEFT Result Date: 03/28/2024 CLINICAL DATA:  Subcapital left femoral neck fracture status post ORIF EXAM: DG HIP (WITH OR WITHOUT PELVIS) 2-3V LEFT COMPARISON:  03/27/2024 FINDINGS: Frontal view of the pelvis as well as frontal and cross-table lateral views of the left hip are obtained. Chronic postsurgical changes from prior right hip ORIF. Interval placement of 3 cannulated screws traversing the acute subcapital left femoral neck fracture, with near anatomic alignment of the fracture site. Remainder of the bony pelvis is unremarkable. IMPRESSION: 1. Interval pinning of the subcapital left femoral neck fracture, with near anatomic alignment. Electronically Signed   By: Ozell Daring M.D.   On: 03/28/2024 13:10   DG HIP UNILAT WITH PELVIS 2-3 VIEWS LEFT Result Date: 03/28/2024 CLINICAL DATA:  Left hip fracture repair EXAM: DG HIP (WITH OR WITHOUT PELVIS) 2-3V LEFT COMPARISON:  03/27/2024 FINDINGS: 6 fluoroscopic images are obtained during the performance of the procedure and are provided for interpretation only. Three cannulated screws are seen traversing the subcapital left femoral neck fracture, with near anatomic alignment of the fracture site. Please refer to the operative report. Fluoroscopy time: 58 seconds, 8.89 mGy IMPRESSION: 1. ORIF of a subcapital left femoral neck fracture as above.  Electronically Signed   By: Ozell Daring M.D.   On: 03/28/2024 13:09   DG C-Arm 1-60 Min-No Report Result Date: 03/28/2024 Fluoroscopy was utilized by the requesting physician.  No radiographic interpretation.     Medical Consultants:   None.   Subjective:    Oscar [name] is controlled has not had a bowel  movement.  Objective:    Vitals:   03/29/24 1403 03/29/24 1958 03/30/24 0546 03/30/24 0725  BP: (!) 104/58 105/69 (!) 144/75 124/72  Pulse: 79 72 64 80  Resp: 18  16 18   Temp: 97.7 F (36.5 C) 98 F (36.7 C) (!) 97.3 F (36.3 C) 97.7 F (36.5 C)  TempSrc: Oral Oral Oral Oral  SpO2: 94% 95% 98% 98%   SpO2: 98 %   Intake/Output Summary (Last 24 hours) at 03/30/2024 9082 Last data filed at 03/30/2024 0900 Gross per 24 hour  Intake --  Output 1025 ml  Net -1025 ml   There were no vitals filed for this visit.  Exam: General exam: In no acute distress. Respiratory system: Good air movement and clear to auscultation. Cardiovascular system: S1 & S2 heard, RRR. No JVD. Gastrointestinal system: Abdomen is nondistended, soft and nontender.  Extremities: No pedal edema. Skin: No rashes, lesions or ulcers Psychiatry: Judgement and insight appear normal. Mood & affect appropriate.  Data Reviewed:    Labs: Basic Metabolic Panel: Recent Labs  Lab 03/27/24 1640 03/28/24 0507 03/29/24 0513  NA 138 137 136  K 4.4 4.1 4.8  CL 103 107 103  CO2 22 18* 24  GLUCOSE 103* 83 108*  BUN 23 16 22   CREATININE 1.03 1.04 1.16  CALCIUM  9.7 8.8* 8.8*  MG  --  2.0  --   PHOS  --  3.7  --    GFR Estimated Creatinine Clearance: 38 mL/min (by C-G formula based on SCr of 1.16 mg/dL). Liver Function Tests: Recent Labs  Lab 03/27/24 1640  AST 18  ALT 13  ALKPHOS 109  BILITOT 0.3  PROT 6.9  ALBUMIN  4.0   No results for input(s): LIPASE, AMYLASE in the last 168 hours. No results for input(s): AMMONIA in the last 168 hours. Coagulation profile Recent Labs  Lab 03/28/24 0507  INR 1.1   COVID-19 Labs  No results for input(s): DDIMER, FERRITIN, LDH, CRP in the last 72 hours.  Lab Results  Component Value Date   SARSCOV2NAA NEGATIVE 12/26/2020   SARSCOV2NAA NEGATIVE 09/28/2020   SARSCOV2NAA NEGATIVE 03/12/2020   SARSCOV2NAA NOT DETECTED 07/10/2019     CBC: Recent Labs  Lab 03/27/24 1640 03/28/24 0507 03/29/24 0513  WBC 7.6 5.7 10.9*  HGB 12.1* 10.7* 11.3*  HCT 38.1* 35.1* 35.8*  MCV 88.4 91.9 89.7  PLT 178 126* 203   Cardiac Enzymes: No results for input(s): CKTOTAL, CKMB, CKMBINDEX, TROPONINI in the last 168 hours. BNP (last 3 results) No results for input(s): PROBNP in the last 8760 hours. CBG: No results for input(s): GLUCAP in the last 168 hours. D-Dimer: No results for input(s): DDIMER in the last 72 hours. Hgb A1c: No results for input(s): HGBA1C in the last 72 hours. Lipid Profile: No results for input(s): CHOL, HDL, LDLCALC, TRIG, CHOLHDL, LDLDIRECT in the last 72 hours. Thyroid  function studies: No results for input(s): TSH, T4TOTAL, T3FREE, THYROIDAB in the last 72 hours.  Invalid input(s): FREET3 Anemia work up: No results for input(s): VITAMINB12, FOLATE, FERRITIN, TIBC, IRON, RETICCTPCT in the last 72 hours. Sepsis Labs: Recent Labs  Lab 03/27/24 1640  03/28/24 0507 03/29/24 0513  WBC 7.6 5.7 10.9*   Microbiology Recent Results (from the past 240 hours)  Surgical pcr screen     Status: None   Collection Time: 03/28/24  2:06 AM   Specimen: Nasal Mucosa; Nasal Swab  Result Value Ref Range Status   MRSA, PCR NEGATIVE NEGATIVE Final   Staphylococcus aureus NEGATIVE NEGATIVE Final    Comment: (NOTE) The Xpert SA Assay (FDA approved for NASAL specimens in patients 46 years of age and older), is one component of a comprehensive surveillance program. It is not intended to diagnose infection nor to guide or monitor treatment. Performed at [facility], [address] [place], [place] [address]      Medications:    aspirin  EC  325 mg Oral Daily   cholecalciferol   1,000 Units Oral Daily   enoxaparin  (LOVENOX ) injection  40 mg Subcutaneous Q24H   fentaNYL  (SUBLIMAZE ) injection  25 mcg Intravenous Once   finasteride   5 mg Oral Daily    furosemide   20 mg Oral Daily   melatonin  6 mg Oral QHS   ondansetron  (ZOFRAN ) IV  4 mg Intravenous Once   pantoprazole   40 mg Oral Daily   polyethylene glycol  17 g Oral BID   sodium chloride  flush  3 mL Intravenous Q12H   tamsulosin   0.4 mg Oral QPC supper   Continuous Infusions:    LOS: 2 days   Erle Odell Castor  [facility]  03/30/2024, 9:17 AM

## 2024-03-30 NOTE — TOC Initial Note (Addendum)
 Transition of Care [facility]) - Initial/Assessment Note    Patient Details  Name: Oscar [name] MRN: [id number] Date of Birth: [birth year]  Transition [facility] Care [facility]) CM/SW Contact:    Bridget Cordella Simmonds, LCSW Phone Number: 03/30/2024, 1:26 PM  Clinical Narrative:       CSW met with pt regarding PT recommendation for SNF.  Pt states not interested in SNF, would like to DC back home with [facility].  Discussed that per PT note, pt requiring 2 person assist to stand up yesterday, however pt reports that last night he got out of bed and walked to the bathroom on his own with the walker.  CSW spoke with RN, who confirmed that report was made to him this AM that pt did get out of bed and walk overnight.  He will reach out to PT for reeval.  1315: TC PT/OT office: not sure if anyone available yet.      CSW spoke with Alfonso Holms, discussed that pt is resistant to SNF, discussed that pt may be doing somewhat better with mobility.  Alfonso said can likely make it work at home with [facility] if pt can walk with walker and limited assist.     In the past, pt has signed out AMA from an ALF facility and Alfonso reports he can be resistant.     Expected Discharge Plan: Home w Home Health Services Barriers to Discharge: Other (must enter comment) (still determining safe DC plan)   Patient Goals and CMS Choice            Expected Discharge Plan and Services In-house Referral: Clinical Social Work   Post Acute Care Choice: Home Health Living arrangements for the past 2 months: Independent Living Facility ([facility] independent living)                                      Prior Living Arrangements/Services Living arrangements for the past 2 months: Marketing executive ([facility] independent living) Lives with:: Spouse Patient language and need for interpreter reviewed:: Yes Do you feel safe going back to the place where you live?: Yes      Need for Family Participation in Patient Care:  Yes (Comment) Care giver support system in place?: Yes (comment) Current home services: Homehealth aide (24/7 HH aide support) Criminal Activity/Legal Involvement Pertinent to Current Situation/Hospitalization: No - Comment as needed  Activities of Daily Living      Permission Sought/Granted                  Emotional Assessment Appearance:: Appears stated age Attitude/Demeanor/Rapport: Engaged Affect (typically observed): Appropriate Orientation: : Oriented to Self, Oriented to Place, Oriented to  Time, Oriented to Situation      Admission diagnosis:  Hip fracture (HCC) [S72.009A] Closed subcapital fracture of left femur, initial encounter (HCC) [S72.012A] Patient Active Problem List   Diagnosis Date Noted   Ground-level fall 03/28/2024   Closed displaced fracture of left femoral neck (HCC) 12/12/2023   Normocytic anemia 12/12/2023   Chronic heart failure with preserved ejection fraction (HFpEF) (HCC) 12/12/2023   OAB (overactive bladder) 11/19/2023   Benign prostatic hyperplasia with nocturia 11/19/2023   Hypercholesterolemia 10/09/2021   Bladder tumor 01/04/2021   Cervical stenosis of spine 01/04/2021   GERD (gastroesophageal reflux disease) 01/04/2021   LBBB (left bundle branch block) 01/04/2021   Ascending aorta dilatation (HCC) 12/07/2020   Apneic episode  11/29/2020   Centrilobular emphysema (HCC) 11/26/2020   Presence of permanent cardiac pacemaker 11/15/2020   Hyponatremia 11/13/2020   E. coli infection    History of appendectomy    History of concussion    History of peptic ulcer    Irregular heart beat    CHB (complete heart block) (HCC) 09/28/2020   Hospital discharge follow-up 03/28/2020   CVA (cerebral vascular accident) (HCC) 03/12/2020   Acute CVA (cerebrovascular accident) (HCC) 03/12/2020   Hypothyroidism (acquired) 03/12/2020   Essential hypertension 03/12/2020   Atrial tachycardia (HCC) 01/24/2020   Exertional dyspnea 01/18/2020   Atrial  tachycardia, paroxysmal (HCC) 08/05/2019   Coronary artery disease involving native coronary artery of native heart without angina pectoris 08/05/2019   Hypothyroidism due to Hashimoto's thyroiditis 07/23/2019   Abnormal nuclear stress test    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   Cervical myelopathy (HCC) 01/27/2018   Hyperlipidemia 11/20/2017   Bunion of great toe of right foot 09/18/2016   S/P bilateral cataract extraction 09/18/2016   Encounter for general adult medical examination without abnormal findings 05/20/2016   Status post placement of implantable loop recorder 11/08/2014   Near syncope 05/16/2014   TIA (transient ischemic attack) 04/09/2014   History of bladder cancer 03/14/2014   Abnormal results of thyroid  function studies 01/19/2014   Personal history of tobacco use, presenting hazards to health 01/12/2014   Transient cerebral ischemia 01/12/2014   Mixed hyperlipidemia 11/24/2013   Adult body mass index 26.0-26.9 06/07/2013   Cancer of bladder (HCC) 02/10/2013   Serum reaction due to vaccination 09/27/2011   Gastro-esophageal reflux disease with esophagitis 09/28/2010   PCP:  Frann Mabel Mt, DO Pharmacy:   [facility] [address] [place], [place] - [address] [place] [address] Phone: [phone] Fax: [phone]  [facility] [place], [place] - [address] [place] [address] Phone: [phone] Fax: [phone]  DARRYLE LONG - [facility] [address] [place] [address] Phone: [phone] Fax: [phone]     Social Drivers of Health (SDOH) Social History: SDOH Screenings   Food Insecurity: No Food Insecurity (03/28/2024)  Housing: Low Risk  (03/28/2024)  Transportation Needs: No Transportation Needs (03/28/2024)  Utilities: Not At Risk (03/28/2024)  Depression (PHQ2-9): Low Risk  (11/19/2023)  Social  Connections: Socially Integrated (03/28/2024)  Tobacco Use: Medium Risk (03/28/2024)   SDOH Interventions:     Readmission Risk Interventions     No data to display

## 2024-03-31 ENCOUNTER — Emergency Department (HOSPITAL_COMMUNITY)

## 2024-03-31 ENCOUNTER — Observation Stay (HOSPITAL_COMMUNITY)
Admission: EM | Admit: 2024-03-31 | Discharge: 2024-04-02 | Disposition: A | Discharge status: Home Health | Source: Skilled Nursing Facility | Attending: Internal Medicine | Admitting: Internal Medicine

## 2024-03-31 DIAGNOSIS — R2689 Other abnormalities of gait and mobility: Secondary | ICD-10-CM | POA: Diagnosis not present

## 2024-03-31 DIAGNOSIS — I2510 Atherosclerotic heart disease of native coronary artery without angina pectoris: Secondary | ICD-10-CM | POA: Insufficient documentation

## 2024-03-31 DIAGNOSIS — K219 Gastro-esophageal reflux disease without esophagitis: Secondary | ICD-10-CM | POA: Insufficient documentation

## 2024-03-31 DIAGNOSIS — R410 Disorientation, unspecified: Secondary | ICD-10-CM | POA: Diagnosis not present

## 2024-03-31 DIAGNOSIS — I110 Hypertensive heart disease with heart failure: Secondary | ICD-10-CM | POA: Insufficient documentation

## 2024-03-31 DIAGNOSIS — E039 Hypothyroidism, unspecified: Secondary | ICD-10-CM | POA: Insufficient documentation

## 2024-03-31 DIAGNOSIS — S72002A Fracture of unspecified part of neck of left femur, initial encounter for closed fracture: Secondary | ICD-10-CM | POA: Diagnosis not present

## 2024-03-31 DIAGNOSIS — W19XXXA Unspecified fall, initial encounter: Secondary | ICD-10-CM | POA: Insufficient documentation

## 2024-03-31 DIAGNOSIS — N400 Enlarged prostate without lower urinary tract symptoms: Secondary | ICD-10-CM | POA: Insufficient documentation

## 2024-03-31 DIAGNOSIS — I5032 Chronic diastolic (congestive) heart failure: Secondary | ICD-10-CM | POA: Diagnosis not present

## 2024-03-31 DIAGNOSIS — E785 Hyperlipidemia, unspecified: Secondary | ICD-10-CM | POA: Diagnosis not present

## 2024-03-31 DIAGNOSIS — G9340 Encephalopathy, unspecified: Secondary | ICD-10-CM | POA: Diagnosis not present

## 2024-03-31 DIAGNOSIS — R55 Syncope and collapse: Principal | ICD-10-CM | POA: Diagnosis present

## 2024-03-31 DIAGNOSIS — Z950 Presence of cardiac pacemaker: Secondary | ICD-10-CM | POA: Insufficient documentation

## 2024-03-31 DIAGNOSIS — Z7982 Long term (current) use of aspirin: Secondary | ICD-10-CM | POA: Insufficient documentation

## 2024-03-31 LAB — CBC
HCT: 38.4 % — ABNORMAL LOW (ref 39.0–52.0)
Hemoglobin: 12.3 g/dL — ABNORMAL LOW (ref 13.0–17.0)
MCH: 28.2 pg (ref 26.0–34.0)
MCHC: 32 g/dL (ref 30.0–36.0)
MCV: 88.1 fL (ref 80.0–100.0)
Platelets: 256 K/uL (ref 150–400)
RBC: 4.36 MIL/uL (ref 4.22–5.81)
RDW: 15.7 % — ABNORMAL HIGH (ref 11.5–15.5)
WBC: 6.3 K/uL (ref 4.0–10.5)
nRBC: 0 % (ref 0.0–0.2)

## 2024-03-31 LAB — CBC WITH DIFFERENTIAL/PLATELET
Abs Immature Granulocytes: 0.04 K/uL (ref 0.00–0.07)
Basophils Absolute: 0 K/uL (ref 0.0–0.1)
Basophils Relative: 0 %
Eosinophils Absolute: 0.1 K/uL (ref 0.0–0.5)
Eosinophils Relative: 1 %
HCT: 41.2 % (ref 39.0–52.0)
Hemoglobin: 13.1 g/dL (ref 13.0–17.0)
Immature Granulocytes: 0 %
Lymphocytes Relative: 10 %
Lymphs Abs: 1 K/uL (ref 0.7–4.0)
MCH: 28.4 pg (ref 26.0–34.0)
MCHC: 31.8 g/dL (ref 30.0–36.0)
MCV: 89.2 fL (ref 80.0–100.0)
Monocytes Absolute: 0.7 K/uL (ref 0.1–1.0)
Monocytes Relative: 7 %
Neutro Abs: 7.4 K/uL (ref 1.7–7.7)
Neutrophils Relative %: 82 %
Platelets: 228 K/uL (ref 150–400)
RBC: 4.62 MIL/uL (ref 4.22–5.81)
RDW: 15.7 % — ABNORMAL HIGH (ref 11.5–15.5)
WBC: 9.2 K/uL (ref 4.0–10.5)
nRBC: 0 % (ref 0.0–0.2)

## 2024-03-31 LAB — TROPONIN I (HIGH SENSITIVITY): Troponin I (High Sensitivity): 8 ng/L (ref <18)

## 2024-03-31 LAB — BASIC METABOLIC PANEL WITH GFR
Anion gap: 13 (ref 5–15)
Anion gap: 14 (ref 5–15)
BUN: 22 mg/dL (ref 8–23)
BUN: 26 mg/dL — ABNORMAL HIGH (ref 8–23)
CO2: 23 mmol/L (ref 22–32)
CO2: 25 mmol/L (ref 22–32)
Calcium: 9.4 mg/dL (ref 8.9–10.3)
Calcium: 9.3 mg/dL (ref 8.9–10.3)
Chloride: 101 mmol/L (ref 98–111)
Chloride: 97 mmol/L — ABNORMAL LOW (ref 98–111)
Creatinine, Ser: 0.94 mg/dL (ref 0.61–1.24)
Creatinine, Ser: 1.11 mg/dL (ref 0.61–1.24)
GFR, Estimated: >60 mL/min (ref >60)
GFR, Estimated: >60 mL/min (ref >60)
Glucose, Bld: 106 mg/dL — ABNORMAL HIGH (ref 70–99)
Glucose, Bld: 108 mg/dL — ABNORMAL HIGH (ref 70–99)
Potassium: 4.1 mmol/L (ref 3.5–5.1)
Potassium: 4.3 mmol/L (ref 3.5–5.1)
Sodium: 137 mmol/L (ref 135–145)
Sodium: 136 mmol/L (ref 135–145)

## 2024-03-31 LAB — BRAIN NATRIURETIC PEPTIDE: B Natriuretic Peptide: 53.3 pg/mL (ref 0.0–100.0)

## 2024-03-31 MED ORDER — FUROSEMIDE 20 MG PO TABS: 20.0000 mg | ORAL_TABLET | Freq: Every day | ORAL | Status: DC | PRN

## 2024-03-31 NOTE — ED Triage Notes (Signed)
 BIB [facility] for near syncope from [place]. Patient was recent d/c today from hospital. Stood up to walk  and became dizzy.   96/60 , HR 70s paced, [id number]

## 2024-03-31 NOTE — Plan of Care (Signed)

## 2024-03-31 NOTE — Plan of Care (Signed)
  Problem: Education: Goal: Knowledge of General Education information will improve Description: Including pain rating scale, medication(s)/side effects and non-pharmacologic comfort measures 03/31/2024 1549 by Francia Tinnie BROCKS, RN Outcome: Adequate for Discharge 03/31/2024 1549 by Francia Tinnie BROCKS, RN Outcome: Progressing 03/31/2024 1027 by Francia Tinnie BROCKS, RN Outcome: Progressing   Problem: Health Behavior/Discharge Planning: Goal: Ability to manage health-related needs will improve Outcome: Adequate for Discharge   Problem: Clinical Measurements: Goal: Ability to maintain clinical measurements within normal limits will improve Outcome: Adequate for Discharge Goal: Will remain free from infection Outcome: Adequate for Discharge Goal: Diagnostic test results will improve Outcome: Adequate for Discharge Goal: Respiratory complications will improve Outcome: Adequate for Discharge Goal: Cardiovascular complication will be avoided Outcome: Adequate for Discharge   Problem: Activity: Goal: Risk for activity intolerance will decrease 03/31/2024 1549 by Francia Tinnie BROCKS, RN Outcome: Adequate for Discharge 03/31/2024 1549 by Francia Tinnie BROCKS, RN Outcome: Progressing 03/31/2024 1027 by Francia Tinnie BROCKS, RN Outcome: Progressing   Problem: Nutrition: Goal: Adequate nutrition will be maintained Outcome: Adequate for Discharge   Problem: Coping: Goal: Level of anxiety will decrease Outcome: Adequate for Discharge   Problem: Elimination: Goal: Will not experience complications related to bowel motility Outcome: Adequate for Discharge Goal: Will not experience complications related to urinary retention Outcome: Adequate for Discharge   Problem: Pain Managment: Goal: General experience of comfort will improve and/or be controlled 03/31/2024 1549 by Francia Tinnie BROCKS, RN Outcome: Adequate for Discharge 03/31/2024 1549 by Francia Tinnie BROCKS, RN Outcome: Progressing 03/31/2024 1027 by Francia Tinnie BROCKS, RN Outcome: Progressing   Problem: Safety: Goal: Ability to remain free from injury will improve 03/31/2024 1549 by Francia Tinnie BROCKS, RN Outcome: Adequate for Discharge 03/31/2024 1549 by Francia Tinnie BROCKS, RN Outcome: Progressing 03/31/2024 1027 by Francia Tinnie BROCKS, RN Outcome: Progressing   Problem: Skin Integrity: Goal: Risk for impaired skin integrity will decrease 03/31/2024 1549 by Francia Tinnie BROCKS, RN Outcome: Adequate for Discharge 03/31/2024 1549 by Francia Tinnie BROCKS, RN Outcome: Progressing 03/31/2024 1027 by Francia Tinnie BROCKS, RN Outcome: Progressing

## 2024-03-31 NOTE — Care Management Important Message (Signed)
 Important Message  Patient Details  Name: Oscar [name] MRN: [id number] Date of Birth: [birth year]   Important Message Given:  Yes - Medicare IM     Jon Cruel 03/31/2024, 1:11 PM

## 2024-03-31 NOTE — ED Provider Notes (Signed)
 [place] EMERGENCY DEPARTMENT AT [facility] Provider Note   CSN: [id number] Arrival date & time: 03/31/24  8063     Patient presents with: No chief complaint on file.   Oscar [name] is a 89 y.o. male.   Patient is a 89 year old male who presents after syncopal event.  He has a history of peptic ulcer disease, pacemaker, hypertension, coronary artery disease, TIA.  He recently was admitted to hospital after he had a fall and had a hip fracture.  He had an intertrochanteric screw placed.  He was discharged from the hospital today.  Per the discharge notes, he was still having orthostasis which was felt to be compounded by his Flomax  and opioid use.  It was recommended that he stay in the hospital but he refused and was discharged back to [facility], his independent living facility.  I spoke to the manager there who states that they were called because patient was sitting at his table and became unresponsive.  He had a syncopal episode there.  He did not fall off the table.  He was unresponsive for a few minutes.  There was no witnessed seizure activity.  Patient does not seem to remember this.  He states that he was not discharged from the hospital today and that this was a few days ago.         Prior to Admission medications   Medication Sig Start Date End Date Taking? Authorizing Provider  acetaminophen  (TYLENOL ) 500 MG tablet Take 2 tablets (1,000 mg total) by mouth every 8 (eight) hours. Patient taking differently: Take 1,000 mg by mouth every 8 (eight) hours as needed for mild pain (pain score 1-3) or moderate pain (pain score 4-6). 12/17/23   ShahmehdiAdriana LABOR, MD  aspirin  EC 81 MG tablet Take 1 tablet (81 mg total) by mouth 2 (two) times daily for 28 days. Swallow whole. 03/31/24 04/28/24  Renae Bernarda HERO, PA-C  atorvastatin  (LIPITOR) 40 MG tablet Take 1 tablet (40 mg total) by mouth daily. 11/27/20   Revankar, Jennifer SAUNDERS, MD  Cholecalciferol  (VITAMIN D -3) 125 MCG (5000  UT) TABS Take 5,000 Units by mouth daily.    [provider]  ferrous sulfate  325 (65 FE) MG EC tablet Take 1 tablet (325 mg total) by mouth daily with breakfast. 01/20/24   Wendling, Mabel Mt, DO  finasteride  (PROSCAR ) 5 MG tablet Take 1 tablet (5 mg total) by mouth daily. 03/23/24   Frann Mabel Mt, DO  furosemide  (LASIX ) 20 MG tablet Take 1 tablet (20 mg total) by mouth daily as needed. Given your drop in blood pressure with standing, use this only as needed for swelling or weight gain. 03/31/24   Perri LABOR Meliton Mickey., MD  Multiple Vitamin (MULTIVITAMIN WITH MINERALS) TABS tablet Take 1 tablet by mouth daily. 12/17/23   Shahmehdi, Adriana LABOR, MD  pantoprazole  (PROTONIX ) 40 MG tablet Take 1 tablet (40 mg total) by mouth daily. 11/19/23   Frann Mabel Mt, DO    Allergies: Patient has no known allergies.    Review of Systems  Unable to perform ROS: Mental status change    Updated Vital Signs BP 126/74   Pulse 78   Temp 97.8 F (36.6 C) (Oral)   Resp 16   SpO2 99%   Physical Exam Constitutional:      Appearance: He is well-developed.  HENT:     Head: Normocephalic and atraumatic.  Eyes:     Pupils: Pupils are equal, round, and reactive  to light.  Cardiovascular:     Rate and Rhythm: Normal rate and regular rhythm.     Heart sounds: Normal heart sounds.  Pulmonary:     Effort: Pulmonary effort is normal. No respiratory distress.     Breath sounds: Normal breath sounds. No wheezing or rales.  Chest:     Chest wall: No tenderness.  Abdominal:     General: Bowel sounds are normal.     Palpations: Abdomen is soft.     Tenderness: There is no abdominal tenderness. There is no guarding or rebound.  Musculoskeletal:        General: Normal range of motion.     Cervical back: Normal range of motion and neck supple.     Comments: Ecchymotic areas to his left hand and forearm, no bony tenderness on palpation or range of motion of the extremities  Lymphadenopathy:      Cervical: No cervical adenopathy.  Skin:    General: Skin is warm and dry.     Findings: No rash.  Neurological:     Mental Status: He is alert.     Comments: He is oriented to person place and year/month.  However he seems to be confused as to recent events.  He is moving all extremities symmetrically without obvious focal deficits.     (all labs ordered are listed, but only abnormal results are displayed) Labs Reviewed  BASIC METABOLIC PANEL WITH GFR - Abnormal; Notable for the following components:      Result Value   Chloride 97 (*)    Glucose, Bld 108 (*)    BUN 26 (*)    All other components within normal limits  CBC WITH DIFFERENTIAL/PLATELET - Abnormal; Notable for the following components:   RDW 15.7 (*)    All other components within normal limits  BRAIN NATRIURETIC PEPTIDE  URINALYSIS, W/ REFLEX TO CULTURE (INFECTION SUSPECTED)  TROPONIN I (HIGH SENSITIVITY)  TROPONIN I (HIGH SENSITIVITY)    EKG: EKG Interpretation Date/Time:  Wednesday March 31 2024 19:41:02 EDT Ventricular Rate:  88 PR Interval:  146 QRS Duration:  145 QT Interval:  408 QTC Calculation: 494 R Axis:   -39  Text Interpretation: A-V dual-paced complexes w/ some inhibition No further analysis attempted due to paced rhythm Confirmed by Lenor Hollering [phone]) on 03/31/2024 10:11:00 PM  Radiology: CT Head Wo Contrast Result Date: 03/31/2024 CLINICAL DATA:  Near syncopal episode, dizziness, mental status changes, unknown cause. EXAM: CT HEAD WITHOUT CONTRAST TECHNIQUE: Contiguous axial images were obtained from the base of the skull through the vertex without intravenous contrast. RADIATION DOSE REDUCTION: This exam was performed according to the departmental dose-optimization program which includes automated exposure control, adjustment of the mA and/or kV according to patient size and/or use of iterative reconstruction technique. COMPARISON:  Recent head CT 03/27/2024. FINDINGS: Brain: There is mild  cerebellar and mild-to-moderate cerebral atrophy with atrophic ventriculomegaly and moderately advanced small vessel disease of the cerebral white matter. No cortical based acute infarct, hemorrhage, mass or mass effect are seen. There are benign dural calcifications in the falx. No midline shift. Basal cisterns are clear. Vascular: There is calcific plaque in the carotid siphons. No hyperdense central vessel is seen. Skull: Negative for fractures or focal lesions. Sinuses/Orbits: Negative orbits with prior lens replacements. Clear sinuses and right mastoids with trace fluid again in the left mastoid tip. Other: Left-sided deviation and spurring of the bony nasal septum. IMPRESSION: 1. No acute intracranial CT findings or interval changes. 2. Atrophy  and small vessel disease. 3. Carotid atherosclerosis. 4. Trace fluid in the left mastoid tip, seen previously. Electronically Signed   By: Francis Quam M.D.   On: 03/31/2024 22:43     Procedures   Medications Ordered in the ED - No data to display                                  Medical Decision Making Amount and/or Complexity of Data Reviewed Labs: ordered. Radiology: ordered.  Risk Decision regarding hospitalization.   This patient presents to the ED for concern of syncope, this involves an extensive number of treatment options, and is a complaint that carries with it a high risk of complications and morbidity.  I considered the following differential and admission for this acute, potentially life threatening condition.  The differential diagnosis includes arrhythmia, orthostatic hypotension, seizure, anemia, infection, CVA  MDM:    Patient is 89 year old who presents after syncopal episode, just after being discharged from the hospital earlier today from a hip fracture.  He currently is asymptomatic and overall well-appearing.  He does not have any hypoxia or other concerns for PE.  He has not had any evident arrhythmias.  His labs are  nonconcerning.  He does seem a little bit confused.  Head CT was performed as did not show any acute abnormality.  Discussed with Dr. Segars who admit the patient for further treatment.  (Labs, imaging, consults)  Labs: I Ordered, and personally interpreted labs.  The pertinent results include: Nonconcerning  Imaging Studies ordered: I ordered imaging studies including head CT I independently visualized and interpreted imaging. I agree with the radiologist interpretation  Additional history obtained from chart.  External records from outside source obtained and reviewed including prior notes  Cardiac Monitoring: The patient was maintained on a cardiac monitor.  If on the cardiac monitor, I personally viewed and interpreted the cardiac monitored which showed an underlying rhythm of: Paced rhythm  Reevaluation: After the interventions noted above, I reevaluated the patient and found that they have :stayed the same  Social Determinants of Health:    Disposition: Admit to hospital  Co morbidities that complicate the patient evaluation  Past Medical History:  Diagnosis Date   Abnormal nuclear stress test    Abnormal results of thyroid  function studies 01/19/2014   Formatting of this note might be different from the original. August 2016  TSH 4.01   Apneic episode 11/29/2020   Ascending aorta dilatation (HCC) 12/07/2020   Atrial tachycardia, paroxysmal (HCC) 08/05/2019   Bunion of great toe of right foot 09/18/2016   Centrilobular emphysema (HCC) 11/26/2020   Cervical stenosis of spine    CHB (complete heart block) (HCC) 09/28/2020   Coronary artery disease involving native coronary artery of native heart without angina pectoris 08/05/2019   CVA (cerebral vascular accident) (HCC) 03/12/2020   Encounter for general adult medical examination without abnormal findings 05/20/2016   Essential hypertension 03/12/2020   Exertional dyspnea 01/18/2020   Gastro-esophageal reflux disease  with esophagitis 09/28/2010   GERD (gastroesophageal reflux disease)    History of bladder cancer 03/14/2014   History of concussion    X2   NO RESIDUALS   History of peptic ulcer 1970'S   Hypercholesterolemia    Hypothyroidism due to Hashimoto's thyroiditis 07/23/2019   LBBB (left bundle branch block)    Macular degeneration of left eye    Mixed hyperlipidemia 11/24/2013   Formatting of  this note might be different from the original. History of TIA.  The goal is to have your total cholesterol < 200, the HDL (good cholesterol) >40, and the LDL (bad cholesterol) <100. It is recomended that you follow a good low fat diet and exercise for 30 minutes 3-4 times a week.   Personal history of tobacco use, presenting hazards to health 01/12/2014   Presence of permanent cardiac pacemaker 11/15/2020   S/P bilateral cataract extraction 09/18/2016   Serum reaction due to vaccination 09/27/2011   Formatting of this note might be different from the original. IMPRESSION: seen on Wednesday for reaction to right arm after pneumovax, had redness, warmth , tenderness and edema of right upper arm which is now resolved. monitor area. use warm compresses prn. call prn. discussed will not need another pneumovax, he will consider zostavax in future.   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   Status post placement of implantable loop recorder 11/08/2014   TIA (transient ischemic attack) 04/09/2014     Medicines No orders of the defined types were placed in this encounter.   I have reviewed the patients home medicines and have made adjustments as needed  Problem List / ED Course: Problem List Items Addressed This Visit   None Visit Diagnoses       Syncope, unspecified syncope type    -  Primary     Confusion                    Final diagnoses:  Syncope, unspecified syncope type  Confusion    ED Discharge Orders     None          Lenor Hollering, MD 03/31/24  2347

## 2024-03-31 NOTE — TOC Transition Note (Signed)
 Transition of Care [facility]) - Discharge Note   Patient Details  Name: Oscar [name] MRN: [id number] Date of Birth: October 21, 1930  [facility] CM/SW Contact:  Rosalva Jon Bloch, RN Phone Number: 03/31/2024, 3:58 PM   Clinical Narrative:    Patient will DC to: home Anticipated DC date:  03/31/2024 Family notified: yes, International aid/development worker by: care [employer] [place], [phone])   Per MD patient ready for DC today . RN, patient, patient's daughter , and  Alfonso PIES @  [address] [facility] notified of DC. Per daughter Dorthea pt's wife already has 24/7 PCS in the home provided by [facility]. Dorthea states [facility] to assist with care for pt once home also.  Pt without DME needs. Pt agreeable to home health services. NCM  faxed home health orders to [facility] [place]@ [phone]. Pt without RX MED concerns. Post hospital f/u noted on AVS.   Bruce McReynolds/ [place], [phone], providing transportation to home.   RNCM will sign off for now as intervention is no longer needed. Please consult us  again if new needs arise.    Final next level of care: Home w Home Health Services Barriers to Discharge: No Barriers Identified   Patient Goals and CMS Choice            Discharge Placement                       Discharge Plan and Services Additional resources added to the After Visit Summary for   In-house Referral: Clinical Social Work   Post Acute Care Choice: Home Health                               Social Drivers of Health (SDOH) Interventions SDOH Screenings   Food Insecurity: No Food Insecurity (03/28/2024)  Housing: Low Risk  (03/28/2024)  Transportation Needs: No Transportation Needs (03/28/2024)  Utilities: Not At Risk (03/28/2024)  Depression (PHQ2-9): Low Risk  (11/19/2023)  Social Connections: Socially Integrated (03/28/2024)  Tobacco Use: Medium Risk (03/28/2024)     Readmission Risk Interventions     No data to display

## 2024-03-31 NOTE — Progress Notes (Signed)
 Physical Therapy Treatment Patient Details Name: Oscar [name] MRN: [id number] DOB: June 25, 1931 Today's Date: 03/31/2024   History of Present Illness Pt is a 89 y/o male who presents s/p fall, sustaining a L femoral next fracture. He is now s/p cannulated screw fixation on 03/27/2024. PMH significant for Apneic episoide, centrilobular emphysema, CAD, CVA, HTN, DOE, bladder CA, concussion, LBBB, macular degeneration L eye, pacemaker, ACDF 2019, R IM nail.    PT Comments  Received pt semi-reclined in bed and agreeable to PT treatment. Pt performed bed mobility with HOB elevated and use of bedrails with mod I and increased time. Donned shorts sitting EOB with setup assist and increased time and performed all transfers with RW and supervision throughout session. Pt reported his balance felt off and complaining of dizziness throughout session - BP: 114/87. Pt ambulated 16ft x 1 and 57ft x 1 with RW and supervision and left in recliner to order breakfast. Pt requesting to continue therapy services at [facility] home [facility]). Recommend HHPT services upon discharge.     If plan is discharge home, recommend the following: A little help with walking and/or transfers;A little help with bathing/dressing/bathroom;Two people to help with bathing/dressing/bathroom;Assist for transportation;Help with stairs or ramp for entrance   Can travel by private vehicle     Yes  Equipment Recommendations  None recommended by PT (already has RW)    Recommendations for Other Services       Precautions / Restrictions Precautions Precautions: Fall Recall of Precautions/Restrictions: Intact Restrictions Weight Bearing Restrictions Per Provider Order: Yes LLE Weight Bearing Per Provider Order: Weight bearing as tolerated     Mobility  Bed Mobility Overal bed mobility: Modified Independent Bed Mobility: Rolling, Supine to Sit Rolling: Modified independent (Device/Increase time), Used rails   Supine to  sit: Modified independent (Device/Increase time), HOB elevated, Used rails     General bed mobility comments: increased time but no physical assist required Patient Response: Cooperative  Transfers Overall transfer level: Needs assistance Equipment used: Rolling walker (2 wheels) Transfers: Sit to/from Stand Sit to Stand: Supervision           General transfer comment: stood from low sitting EOB    Ambulation/Gait Ambulation/Gait assistance: Supervision Gait Distance (Feet): 70 Feet (12ft +57ft) Assistive device: Rolling walker (2 wheels) Gait Pattern/deviations: Step-through pattern, Step-to pattern, Decreased stride length, Trunk flexed, Narrow base of support Gait velocity: Decreased Gait velocity interpretation: 1.31 - 2.62 ft/sec, indicative of limited Financial controller     Tilt Bed Tilt Bed Patient Response: Cooperative  Modified Rankin (Stroke Patients Only)       Balance Overall balance assessment: Needs assistance Sitting-balance support: Feet supported, No upper extremity supported Sitting balance-Leahy Scale: Fair Sitting balance - Comments: supervision for dynamic sitting balance while donning shorts   Standing balance support: Bilateral upper extremity supported, During functional activity, Reliant on assistive device for balance (RW) Standing balance-Leahy Scale: Fair Standing balance comment: able to maintain static and dynamic standing balance with BUE support on RW and supervision                            Communication Communication Communication: No apparent difficulties  Cognition Arousal: Alert Behavior During Therapy: WFL for tasks assessed/performed   PT - Cognitive impairments: No apparent impairments  Following commands: Intact      Cueing    Exercises      General Comments General comments (skin integrity, edema, etc.): pt  reporting dizziness throughout session that did not improve - BP: 114/87 with knee high teds on      Pertinent Vitals/Pain Pain Assessment Pain Assessment: No/denies pain    Home Living                          Prior Function            PT Goals (current goals can now be found in the care plan section) Acute Rehab PT Goals Patient Stated Goal: Home at d/c PT Goal Formulation: With patient Time For Goal Achievement: 04/05/24 Potential to Achieve Goals: Good Progress towards PT goals: Progressing toward goals    Frequency    Min 2X/week      PT Plan      Co-evaluation              AM-PAC PT 6 Clicks Mobility   Outcome Measure  Help needed turning from your back to your side while in a flat bed without using bedrails?: None Help needed moving from lying on your back to sitting on the side of a flat bed without using bedrails?: None Help needed moving to and from a bed to a chair (including a wheelchair)?: A Little Help needed standing up from a chair using your arms (e.g., wheelchair or bedside chair)?: A Little Help needed to walk in hospital room?: A Little Help needed climbing 3-5 steps with a railing? : A Lot 6 Click Score: 19    End of Session Equipment Utilized During Treatment: Gait belt Activity Tolerance: Patient tolerated treatment well Patient left: in chair;with call bell/phone within reach;with chair alarm set Nurse Communication: Mobility status PT Visit Diagnosis: Unsteadiness on feet (R26.81);Muscle weakness (generalized) (M62.81);Dizziness and giddiness (R42)     Time: 0742-0800 PT Time Calculation (min) (ACUTE ONLY): 18 min  Charges:    $Therapeutic Activity: 8-22 mins PT General Charges $$ ACUTE PT VISIT: 1 Visit                     Therisa Stains PT, DPT Therisa HERO Zaunegger 03/31/2024, 8:07 AM

## 2024-03-31 NOTE — Discharge Summary (Signed)
 Physician Discharge Summary  Oscar [name] FMW:[id number] DOB: 1931/07/13 DOA: 03/27/2024  PCP: Oscar Mabel Mt, DO  Admit date: 03/27/2024 Discharge date: 03/31/2024  Time spent: 40 minutes  Recommendations for Outpatient Follow-up:  Follow outpatient CBC/CMP  Continued orthostasis on discharge - stop flomax , stop opiates.  Use lasix  only as needed.  Recommended he stay, but he refused.  Consider echo outpatient.  Recommend change position slowly, compression stockings.    Discharge Diagnoses:  Principal Problem:   Closed displaced fracture of left femoral neck (HCC) Active Problems:   Ground-level fall   Discharge Condition: stable  Diet recommendation: heart healthy  There were no vitals filed for this visit.  History of present illness:   Oscar [name] is an 89 y.o. male past medical history of CVA, heart failure with preserved EF and Oscar [name] pacemaker with Oscar [name] recent orthostatic hypotension, hyperlipidemia, recurrent falls, with right hip fracture requiring ORIF comes in with Oscar [name] left femoral neck fracture.   Now s/p surgery by ortho.    Hospital Course:  Assessment and Plan:    orthostatic hypotension: This places him at risk for recurrent falls.  Will make lasix  prn.  Hold flomax .  Hold opiates. Change positions slowly.  Compression stockings Consider echo outpatient, he refused here Continued orthostasis today, again, he refused to stay.  Drop to 100 systolic (from 130)  Closed displaced fracture of left femoral neck (HCC) Plain films with subcapital fracture of left femoral neck on 7/5 Orthopedic surgery status post cannulated screw fixation L femoral neck on 03/28/2024.   Plain film on 7/6 with interval pinning of subcapital L femoral neck fracture with near anatomic alignment Aspirin  81 mg BID at discharge per orthopedics Plan for therapy in [facility]   Ground-level fall Given report of lightheadedness, suspect related to orthostatic hypotension above EKG v paced    History of CVA: No longer taking statins continue, continue aspirin .   Chronic diastolic dysfunction: Diuretic prn at discharge   Pacemaker: Noted.  Hyperlipidemia: Currently not on statin.   BPH: Continue current home regimen.   GERD: Continue PPI.     Procedures: Cannulated screw fixation left femoral neck    Consultations: orthopedics  Discharge Exam: Vitals:   03/31/24 0506 03/31/24 0730  BP: 114/79 (!) 109/46  Pulse: 68 73  Resp:  16  Temp: 97.9 F (36.6 C) 97.7 F (36.5 C)  SpO2: 96% 98%   Extremely eager to discharge today When I suggest he stay another day so we can get echo for his orthostasis he curses and says no = he understands we're worried about risk of fall and additional injury in setting of his orthostasis  General: No acute distress. Cardiovascular: Heart sounds show Oscar [name] regular rate, and rhythm.  Lungs: Clear to auscultation bilaterally Abdomen: Soft, nontender, nondistended  Neurological: Alert and oriented 3. Moves all extremities 4. Cranial nerves II through XII grossly intact. Skin: Warm and dry. No rashes or lesions. Extremities: dressing to LLE  Discharge Instructions   Discharge Instructions     (HEART FAILURE PATIENTS) Call MD:  Anytime you have any of the following symptoms: 1) 3 pound weight gain in 24 hours or 5 pounds in 1 week 2) shortness of breath, with or without Oscar [name] dry hacking cough 3) swelling in the hands, feet or stomach 4) if you have to sleep on extra pillows at night in order to breathe.   Complete by: As directed    Call MD for:  difficulty breathing, headache or visual disturbances  Complete by: As directed    Call MD for:  extreme fatigue   Complete by: As directed    Call MD for:  hives   Complete by: As directed    Call MD for:  persistant dizziness or light-headedness   Complete by: As directed    Call MD for:  persistant nausea and vomiting   Complete by: As directed    Call MD for:  redness,  tenderness, or signs of infection (pain, swelling, redness, odor or green/yellow discharge around incision site)   Complete by: As directed    Call MD for:  severe uncontrolled pain   Complete by: As directed    Call MD for:  temperature >100.4   Complete by: As directed    Diet - low sodium heart healthy   Complete by: As directed    Discharge instructions   Complete by: As directed    You were seen for Oscar [name] femoral neck fracture.    This was repaired surgically by orthopedics.  I think your risk for falls is your orthostasis (drop in blood pressure when standing).  We'll stop your flomax .  We'll stop your opiates.  We'll stop your lasix  (use this only as needed for swelling or weight gain).  These medicines can cause your blood pressure to fall.  Use compression stockings and change positions slowly.  I think you'd benefit from an outpatient echo.    Again, use lasix  ONLY AS NEEDED.  You should NOT take this daily.  Take only for swelling or weight gain.  Follow with your cardiologist or PCP for additional instructions.  Return for new, recurrent or worsening symptoms.    Please ask your PCP to request records from this hospitalization so they know what was done and what the next steps will be.   Increase activity slowly   Complete by: As directed    No wound care   Complete by: As directed       Allergies as of 03/31/2024   No Known Allergies      Medication List     TAKE these medications    acetaminophen  500 MG tablet Commonly known as: TYLENOL  Take 2 tablets (1,000 mg total) by mouth every 8 (eight) hours. What changed:  when to take this reasons to take this   aspirin  EC 81 MG tablet Take 1 tablet (81 mg total) by mouth 2 (two) times daily for 28 days. Swallow whole. What changed:  medication strength how much to take when to take this additional instructions   atorvastatin  40 MG tablet Commonly known as: LIPITOR Take 1 tablet (40 mg total) by mouth daily.    ferrous sulfate  325 (65 FE) MG EC tablet Take 1 tablet (325 mg total) by mouth daily with breakfast.   finasteride  5 MG tablet Commonly known as: Proscar  Take 1 tablet (5 mg total) by mouth daily.   furosemide  20 MG tablet Commonly known as: LASIX  Take 1 tablet (20 mg total) by mouth daily as needed. Given your drop in blood pressure with standing, use this only as needed for swelling or weight gain. What changed:  when to take this reasons to take this additional instructions   multivitamin with minerals Tabs tablet Take 1 tablet by mouth daily.   pantoprazole  40 MG tablet Commonly known as: PROTONIX  Take 1 tablet (40 mg total) by mouth daily.   Vitamin D -3 125 MCG (5000 UT) Tabs Take 5,000 Units by mouth daily.  No Known Allergies  Follow-up Information     Edna Toribio LABOR, MD Follow up in 2 week(s).   Specialty: Orthopedic Surgery Contact information: [address] [place] [address] [phone]                  The results of significant diagnostics from this hospitalization (including imaging, microbiology, ancillary and laboratory) are listed below for reference.    Significant Diagnostic Studies: DG HIP UNILAT WITH PELVIS 2-3 VIEWS LEFT Result Date: 03/28/2024 CLINICAL DATA:  Subcapital left femoral neck fracture status post ORIF EXAM: DG HIP (WITH OR WITHOUT PELVIS) 2-3V LEFT COMPARISON:  03/27/2024 FINDINGS: Frontal view of the pelvis as well as frontal and cross-table lateral views of the left hip are obtained. Chronic postsurgical changes from prior right hip ORIF. Interval placement of 3 cannulated screws traversing the acute subcapital left femoral neck fracture, with near anatomic alignment of the fracture site. Remainder of the bony pelvis is unremarkable. IMPRESSION: 1. Interval pinning of the subcapital left femoral neck fracture, with near anatomic alignment. Electronically Signed   By: Ozell Daring M.D.   On:  03/28/2024 13:10   DG HIP UNILAT WITH PELVIS 2-3 VIEWS LEFT Result Date: 03/28/2024 CLINICAL DATA:  Left hip fracture repair EXAM: DG HIP (WITH OR WITHOUT PELVIS) 2-3V LEFT COMPARISON:  03/27/2024 FINDINGS: 6 fluoroscopic images are obtained during the performance of the procedure and are provided for interpretation only. Three cannulated screws are seen traversing the subcapital left femoral neck fracture, with near anatomic alignment of the fracture site. Please refer to the operative report. Fluoroscopy time: 58 seconds, 8.89 mGy IMPRESSION: 1. ORIF of Tajia Szeliga subcapital left femoral neck fracture as above. Electronically Signed   By: Ozell Daring M.D.   On: 03/28/2024 13:09   DG C-Arm 1-60 Min-No Report Result Date: 03/28/2024 Fluoroscopy was utilized by the requesting physician.  No radiographic interpretation.   CT HIP LEFT WO CONTRAST Result Date: 03/27/2024 CLINICAL DATA:  Left hip fracture EXAM: CT OF THE LEFT HIP WITHOUT CONTRAST TECHNIQUE: Multidetector CT imaging of the left hip was performed according to the standard protocol. Multiplanar CT image reconstructions were also generated. RADIATION DOSE REDUCTION: This exam was performed according to the departmental dose-optimization program which includes automated exposure control, adjustment of the mA and/or kV according to patient size and/or use of iterative reconstruction technique. COMPARISON:  Left hip radiographs 03/27/2024 FINDINGS: Bones/Joint/Cartilage Transverse fracture of the subcapital left femoral neck with impaction of the fracture fragments. Mild valgus angulation. No inter trochanteric involvement. No dislocation of the hip joint. Degenerative changes in the hip joint with osteophyte formation on both sides of the joint. No significant effusion. Ligaments Suboptimally assessed by CT. Muscles and Tendons No intramuscular mass. Small intramuscular hematoma suggested in the anterior iliac muscle. Soft tissues No significant soft tissue  infiltration or collection. Visualized pelvic organs appear intact. IMPRESSION: 1. Transverse fracture of the subcapital left femoral neck with impaction and mild valgus angulation. No inter trochanteric involvement. 2. Small anterior intramuscular hematoma. Electronically Signed   By: Elsie Gravely M.D.   On: 03/27/2024 19:42   DG Chest 1 View Result Date: 03/27/2024 CLINICAL DATA:  Fall EXAM: CHEST  1 VIEW COMPARISON:  12/11/2023 FINDINGS: Left-sided pacing device as before. No acute airspace disease or effusion. Stable cardiomediastinal silhouette with aortic atherosclerosis. No pneumothorax IMPRESSION: No active disease. Electronically Signed   By: Luke Bun M.D.   On: 03/27/2024 19:03   DG HIP UNILAT WITH  PELVIS 2-3 VIEWS LEFT Result Date: 03/27/2024 CLINICAL DATA:  Left hip pain since falling last night. EXAM: DG HIP (WITH OR WITHOUT PELVIS) 2-3V LEFT COMPARISON:  Pelvic and left hip radiographs 11/20/2004. Right hip radiographs 12/12/2023. Pelvic CT 09/19/2021. FINDINGS: The bones are demineralized. New, presumably acute subcapital fracture of the left femoral neck with mild impaction. The proximal right femur has Dulse Rutan stable appearance status post intramedullary nail and dynamic screw fixation of the previously demonstrated intertrochanteric fracture. No pelvic fracture or dislocation identified. Minimal hip degenerative changes for age. IMPRESSION: 1. New, presumably acute subcapital fracture of the left femoral neck. 2. Stable appearance of the proximal right femur status post ORIF. Electronically Signed   By: Elsie Perone M.D.   On: 03/27/2024 18:01   CT Head Wo Contrast Result Date: 03/27/2024 CLINICAL DATA:  Dizzy spell with fall this morning EXAM: CT HEAD WITHOUT CONTRAST CT CERVICAL SPINE WITHOUT CONTRAST TECHNIQUE: Multidetector CT imaging of the head and cervical spine was performed following the standard protocol without intravenous contrast. Multiplanar CT image reconstructions of the  cervical spine were also generated. RADIATION DOSE REDUCTION: This exam was performed according to the departmental dose-optimization program which includes automated exposure control, adjustment of the mA and/or kV according to patient size and/or use of iterative reconstruction technique. COMPARISON:  None Available. FINDINGS: CT HEAD FINDINGS Brain: No evidence of acute infarction, hemorrhage, hydrocephalus, extra-axial collection or mass lesion/mass effect. Periventricular white matter hypodensity. Vascular: No hyperdense vessel or unexpected calcification. Skull: Normal. Negative for fracture or focal lesion. Sinuses/Orbits: No acute finding. Other: None. CT CERVICAL SPINE FINDINGS Alignment: Postoperative listhesis of C4-C5. Skull base and vertebrae: No acute fracture. No primary bone lesion or focal pathologic process. Soft tissues and spinal canal: No prevertebral fluid or swelling. No visible canal hematoma. Disc levels: Status post anterior cervical discectomy and fusion of C4-C5 with ankylosis of C3-C6 and severe disc degenerative change of C6-C7. Upper chest: Negative. Other: None. IMPRESSION: 1. No acute intracranial pathology. Small-vessel white matter disease in keeping with advanced patient age. 2. No fracture or static subluxation of the cervical spine. 3. Status post anterior cervical discectomy and fusion of C4-C5 with ankylosis of C3-C6 and severe disc degenerative change of C6-C7. Electronically Signed   By: Marolyn JONETTA Jaksch M.D.   On: 03/27/2024 17:59   CT Cervical Spine Wo Contrast Result Date: 03/27/2024 CLINICAL DATA:  Dizzy spell with fall this morning EXAM: CT HEAD WITHOUT CONTRAST CT CERVICAL SPINE WITHOUT CONTRAST TECHNIQUE: Multidetector CT imaging of the head and cervical spine was performed following the standard protocol without intravenous contrast. Multiplanar CT image reconstructions of the cervical spine were also generated. RADIATION DOSE REDUCTION: This exam was performed  according to the departmental dose-optimization program which includes automated exposure control, adjustment of the mA and/or kV according to patient size and/or use of iterative reconstruction technique. COMPARISON:  None Available. FINDINGS: CT HEAD FINDINGS Brain: No evidence of acute infarction, hemorrhage, hydrocephalus, extra-axial collection or mass lesion/mass effect. Periventricular white matter hypodensity. Vascular: No hyperdense vessel or unexpected calcification. Skull: Normal. Negative for fracture or focal lesion. Sinuses/Orbits: No acute finding. Other: None. CT CERVICAL SPINE FINDINGS Alignment: Postoperative listhesis of C4-C5. Skull base and vertebrae: No acute fracture. No primary bone lesion or focal pathologic process. Soft tissues and spinal canal: No prevertebral fluid or swelling. No visible canal hematoma. Disc levels: Status post anterior cervical discectomy and fusion of C4-C5 with ankylosis of C3-C6 and severe disc degenerative change of C6-C7. Upper chest: Negative.  Other: None. IMPRESSION: 1. No acute intracranial pathology. Small-vessel white matter disease in keeping with advanced patient age. 2. No fracture or static subluxation of the cervical spine. 3. Status post anterior cervical discectomy and fusion of C4-C5 with ankylosis of C3-C6 and severe disc degenerative change of C6-C7. Electronically Signed   By: Marolyn JONETTA Jaksch M.D.   On: 03/27/2024 17:59    Microbiology: Recent Results (from the past 240 hours)  Surgical pcr screen     Status: None   Collection Time: 03/28/24  2:06 AM   Specimen: Nasal Mucosa; Nasal Swab  Result Value Ref Range Status   MRSA, PCR NEGATIVE NEGATIVE Final   Staphylococcus aureus NEGATIVE NEGATIVE Final    Comment: (NOTE) The Xpert SA Assay (FDA approved for NASAL specimens in patients 88 years of age and older), is one component of Jonessa Triplett comprehensive surveillance program. It is not intended to diagnose infection nor to guide or monitor  treatment. Performed at [facility], [address] [place], [place] [address]      Labs: Basic Metabolic Panel: Recent Labs  Lab 03/27/24 1640 03/28/24 0507 03/29/24 0513 03/31/24 0850  NA 138 137 136 137  K 4.4 4.1 4.8 4.1  CL 103 107 103 101  CO2 22 18* 24 23  GLUCOSE 103* 83 108* 106*  BUN 23 16 22 22   CREATININE 1.03 1.04 1.16 0.94  CALCIUM  9.7 8.8* 8.8* 9.4  MG  --  2.0  --   --   PHOS  --  3.7  --   --    Liver Function Tests: Recent Labs  Lab 03/27/24 1640  AST 18  ALT 13  ALKPHOS 109  BILITOT 0.3  PROT 6.9  ALBUMIN  4.0   No results for input(s): LIPASE, AMYLASE in the last 168 hours. No results for input(s): AMMONIA in the last 168 hours. CBC: Recent Labs  Lab 03/27/24 1640 03/28/24 0507 03/29/24 0513 03/31/24 0850  WBC 7.6 5.7 10.9* 6.3  HGB 12.1* 10.7* 11.3* 12.3*  HCT 38.1* 35.1* 35.8* 38.4*  MCV 88.4 91.9 89.7 88.1  PLT 178 126* 203 256   Cardiac Enzymes: No results for input(s): CKTOTAL, CKMB, CKMBINDEX, TROPONINI in the last 168 hours. BNP: BNP (last 3 results) No results for input(s): BNP in the last 8760 hours.  ProBNP (last 3 results) No results for input(s): PROBNP in the last 8760 hours.  CBG: No results for input(s): GLUCAP in the last 168 hours.     Signed:  Meliton Monte MD.  [facility] 03/31/2024, 3:26 PM

## 2024-03-31 NOTE — Plan of Care (Signed)
?  Problem: Health Behavior/Discharge Planning: ?Goal: Ability to manage health-related needs will improve ?Outcome: Progressing ?  ?Problem: Nutrition: ?Goal: Adequate nutrition will be maintained ?Outcome: Progressing ?  ?Problem: Safety: ?Goal: Ability to remain free from injury will improve ?Outcome: Progressing ?  ?Problem: Skin Integrity: ?Goal: Risk for impaired skin integrity will decrease ?Outcome: Progressing ?  ?

## 2024-04-01 ENCOUNTER — Other Ambulatory Visit: Payer: Self-pay

## 2024-04-01 ENCOUNTER — Encounter (HOSPITAL_COMMUNITY): Payer: Self-pay | Admitting: Internal Medicine

## 2024-04-01 ENCOUNTER — Observation Stay (HOSPITAL_COMMUNITY)

## 2024-04-01 DIAGNOSIS — R55 Syncope and collapse: Secondary | ICD-10-CM

## 2024-04-01 HISTORY — DX: Syncope and collapse: R55

## 2024-04-01 LAB — TROPONIN I (HIGH SENSITIVITY): Troponin I (High Sensitivity): 8 ng/L (ref <18)

## 2024-04-01 LAB — URINALYSIS, W/ REFLEX TO CULTURE (INFECTION SUSPECTED)
Bacteria, UA: NONE SEEN
Bilirubin Urine: NEGATIVE
Glucose, UA: NEGATIVE mg/dL
Hgb urine dipstick: NEGATIVE
Ketones, ur: 20 mg/dL — AB
Leukocytes,Ua: NEGATIVE
Nitrite: NEGATIVE
Protein, ur: NEGATIVE mg/dL
Specific Gravity, Urine: 1.017 (ref 1.005–1.030)
pH: 5 (ref 5.0–8.0)

## 2024-04-01 LAB — ECHOCARDIOGRAM COMPLETE
Area-P 1/2: 4.15 cm2
S' Lateral: 2.1 cm

## 2024-04-01 MED ORDER — MELATONIN 3 MG PO TABS: 6.0000 mg | ORAL_TABLET | Freq: Every evening | ORAL | Status: DC | PRN

## 2024-04-01 MED ORDER — FINASTERIDE 5 MG PO TABS
5.0000 mg | ORAL_TABLET | Freq: Every day | ORAL | Status: DC
  Administered 2024-04-01 – 2024-04-02 (×2): 5 mg via ORAL
  Filled 2024-04-01 (×2): qty 1

## 2024-04-01 MED ORDER — ENOXAPARIN SODIUM 40 MG/0.4ML IJ SOSY: 40.0000 mg | PREFILLED_SYRINGE | Freq: Every day | INTRAMUSCULAR | Status: DC

## 2024-04-01 MED ORDER — ALBUTEROL SULFATE (2.5 MG/3ML) 0.083% IN NEBU: 2.5000 mg | INHALATION_SOLUTION | RESPIRATORY_TRACT | Status: DC | PRN

## 2024-04-01 MED ORDER — ACETAMINOPHEN 500 MG PO TABS: 1000.0000 mg | ORAL_TABLET | Freq: Four times a day (QID) | ORAL | Status: DC | PRN

## 2024-04-01 MED ORDER — ONDANSETRON HCL 4 MG/2ML IJ SOLN: 4.0000 mg | Freq: Four times a day (QID) | INTRAMUSCULAR | Status: DC | PRN

## 2024-04-01 MED ORDER — MIDODRINE HCL 5 MG PO TABS
5.0000 mg | ORAL_TABLET | Freq: Two times a day (BID) | ORAL | Status: DC
  Administered 2024-04-01 – 2024-04-02 (×3): 5 mg via ORAL
  Filled 2024-04-01 (×3): qty 1

## 2024-04-01 MED ORDER — ATORVASTATIN CALCIUM 40 MG PO TABS: 40.0000 mg | ORAL_TABLET | Freq: Every day | ORAL | Status: DC

## 2024-04-01 MED ORDER — SODIUM CHLORIDE 0.9 % IV BOLUS
1000.0000 mL | Freq: Once | INTRAVENOUS | Status: AC
  Administered 2024-04-01: 1000 mL via INTRAVENOUS

## 2024-04-01 MED ORDER — ASPIRIN 81 MG PO TBEC
81.0000 mg | DELAYED_RELEASE_TABLET | Freq: Two times a day (BID) | ORAL | Status: DC
  Administered 2024-04-01 – 2024-04-02 (×3): 81 mg via ORAL
  Filled 2024-04-01 (×3): qty 1

## 2024-04-01 MED ORDER — PANTOPRAZOLE SODIUM 40 MG PO TBEC
40.0000 mg | DELAYED_RELEASE_TABLET | Freq: Every day | ORAL | Status: DC
  Administered 2024-04-01 – 2024-04-02 (×2): 40 mg via ORAL
  Filled 2024-04-01 (×2): qty 1

## 2024-04-01 MED ORDER — POLYETHYLENE GLYCOL 3350 17 G PO PACK: 17.0000 g | PACK | Freq: Every day | ORAL | Status: DC | PRN

## 2024-04-01 MED ORDER — SODIUM CHLORIDE 0.9% FLUSH
3.0000 mL | Freq: Two times a day (BID) | INTRAVENOUS | Status: DC
  Administered 2024-04-01 – 2024-04-02 (×4): 3 mL via INTRAVENOUS

## 2024-04-01 NOTE — Progress Notes (Signed)
 PT Cancellation Note  Patient Details Name: Oscar [name] MRN: [id number] DOB: April 27, 1931   Cancelled Treatment:    Reason Eval/Treat Not Completed: (P) Patient at procedure or test/unavailable Pt is having ECHO done. PT will follow back later for Evaluation as schedule permits.  Oscar [name] PT, DPT Acute Rehabilitation Services Please use secure chat or  Call Office [phone]    Oscar [name] 04/01/2024, 9:02 AM

## 2024-04-01 NOTE — Progress Notes (Signed)
 Patient orthostatic vitals: lying- 121/61, PR- 77, sitting- 95/71, PR- 80, standing BP-99/59, PR-72

## 2024-04-01 NOTE — Progress Notes (Addendum)
 Transition of Care [facility]) - Inpatient Brief Assessment   Patient Details  Name: Oscar [name] MRN: [id number] Date of Birth: [birth year]  Transition of Care [facility]) CM/SW Contact:    Rosaline JONELLE Joe, RN Phone Number: 04/01/2024, 11:31 AM   Clinical Narrative: CM attempted to meet with the patient but MD was at the bedside with the patient.  I called and spoke with the patient's daughter, Dorthea by phone.  The patient lives at [facility] and lives with the wife at the apartment.  Daughter states that patient currently receives [facility] services for PT/OT but she was unaware of [facility] agency.[facility] is providing 24 hour care in the apartment for the patient's wife GLENWOOD Pin, CM with [facility] - [phone].  DME at the home includes RW, [place].  I spoke with the MD and plans is for patient to likely discharge home tomorrow.  Moon letter provided with [facility] CMA this morning.  Daughter is aware.   I called and left a detailed cell phone message with Alfonso Holms, CM with [facility] [phone] - to updated her regarding patient's continued need for home health PT when patient is discharged.  [facility] will likely provide transportation to home when patient is stable to discharge per daughter.   Transition of Care Asessment: Insurance and Status: Insurance coverage has been reviewed Patient has primary care physician: Yes Home environment has been reviewed: From [place] [facility] Prior level of function:: RW, Hotel manager Home Services: Current home services (Home Health agency) Social Drivers of Health Review: SDOH reviewed needs interventions Readmission risk has been reviewed: Yes Transition of care needs: (P) transition of care needs identified, TOC will continue to follow

## 2024-04-01 NOTE — H&P (Signed)
 History and Physical    Oscar [name] FMW:[id number] DOB: [birth year] DOA: 03/31/2024  PCP: Frann Mabel Mt, DO   Patient coming from: ILF    Chief Complaint: Syncope    HPI:  Oscar [name] is a 89 y.o. male with hx of CVA, heart failure with preserved ejection fraction, pacemaker, mixed hypertension with recent orthostatic hypotension, hyperlipidemia, recurrent falls, right hip fracture requiring ORIF in 3/'25, and just admitted 7/6-7/9 with fall possibly syncopal c/b L hip femoral neck fracture and underwent ORIF on 7/6 with Dr. Edna. He was orthostatic but refused to stay for continued care. Went to his ILF and immediately had a witnessed syncopal episode. On my interview he is confused and disoriented to place and situation. He has no complaints.    Review of Systems:  ROS complete and negative except as marked above   No Known Allergies  Prior to Admission medications   Medication Sig Start Date End Date Taking? Authorizing Provider  acetaminophen  (TYLENOL ) 500 MG tablet Take 2 tablets (1,000 mg total) by mouth every 8 (eight) hours. Patient taking differently: Take 1,000 mg by mouth every 8 (eight) hours as needed for mild pain (pain score 1-3) or moderate pain (pain score 4-6). 12/17/23   ShahmehdiAdriana LABOR, MD  aspirin  EC 81 MG tablet Take 1 tablet (81 mg total) by mouth 2 (two) times daily for 28 days. Swallow whole. 03/31/24 04/28/24  Renae Bernarda HERO, PA-C  atorvastatin  (LIPITOR) 40 MG tablet Take 1 tablet (40 mg total) by mouth daily. 11/27/20   Revankar, Jennifer SAUNDERS, MD  Cholecalciferol  (VITAMIN D -3) 125 MCG (5000 UT) TABS Take 5,000 Units by mouth daily.    [provider]  ferrous sulfate  325 (65 FE) MG EC tablet Take 1 tablet (325 mg total) by mouth daily with breakfast. 01/20/24   Wendling, Mabel Mt, DO  finasteride  (PROSCAR ) 5 MG tablet Take 1 tablet (5 mg total) by mouth daily. 03/23/24   Frann Mabel Mt, DO  furosemide  (LASIX ) 20 MG tablet  Take 1 tablet (20 mg total) by mouth daily as needed. Given your drop in blood pressure with standing, use this only as needed for swelling or weight gain. 03/31/24   Perri LABOR Meliton Mickey., MD  Multiple Vitamin (MULTIVITAMIN WITH MINERALS) TABS tablet Take 1 tablet by mouth daily. 12/17/23   Shahmehdi, Adriana LABOR, MD  pantoprazole  (PROTONIX ) 40 MG tablet Take 1 tablet (40 mg total) by mouth daily. 11/19/23   Frann Mabel Mt, DO    Past Medical History:  Diagnosis Date   Abnormal nuclear stress test    Abnormal results of thyroid  function studies 01/19/2014   Formatting of this note might be different from the original. August 2016  TSH 4.01   Apneic episode 11/29/2020   Ascending aorta dilatation (HCC) 12/07/2020   Atrial tachycardia, paroxysmal (HCC) 08/05/2019   Bunion of great toe of right foot 09/18/2016   Centrilobular emphysema (HCC) 11/26/2020   Cervical stenosis of spine    CHB (complete heart block) (HCC) 09/28/2020   Coronary artery disease involving native coronary artery of native heart without angina pectoris 08/05/2019   CVA (cerebral vascular accident) (HCC) 03/12/2020   Encounter for general adult medical examination without abnormal findings 05/20/2016   Essential hypertension 03/12/2020   Exertional dyspnea 01/18/2020   Gastro-esophageal reflux disease with esophagitis 09/28/2010   GERD (gastroesophageal reflux disease)    History of bladder cancer 03/14/2014   History of concussion    X2   NO RESIDUALS  History of peptic ulcer 1970'S   Hypercholesterolemia    Hypothyroidism due to Hashimoto's thyroiditis 07/23/2019   LBBB (left bundle branch block)    Macular degeneration of left eye    Mixed hyperlipidemia 11/24/2013   Formatting of this note might be different from the original. History of TIA.  The goal is to have your total cholesterol < 200, the HDL (good cholesterol) >40, and the LDL (bad cholesterol) <100. It is recomended that you follow a good low fat  diet and exercise for 30 minutes 3-4 times a week.   Personal history of tobacco use, presenting hazards to health 01/12/2014   Presence of permanent cardiac pacemaker 11/15/2020   S/P bilateral cataract extraction 09/18/2016   Serum reaction due to vaccination 09/27/2011   Formatting of this note might be different from the original. IMPRESSION: seen on Wednesday for reaction to right arm after pneumovax, had redness, warmth , tenderness and edema of right upper arm which is now resolved. monitor area. use warm compresses prn. call prn. discussed will not need another pneumovax, he will consider zostavax in future.   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   Status post placement of implantable loop recorder 11/08/2014   TIA (transient ischemic attack) 04/09/2014    Past Surgical History:  Procedure Laterality Date   ANTERIOR CERVICAL DECOMP/DISCECTOMY FUSION N/A 01/27/2018   Procedure: Anterior Cervical Discectomy Fusion - Cervical Four- Cervical Five;  Surgeon: Louis Shove, MD;  Location: [facility] OR;  Service: Neurosurgery;  Laterality: N/A;  Anterior Cervical Discectomy Fusion - Cervical Four- Cervical Five   APPENDECTOMY  1970   CARDIOVASCULAR STRESS TEST  03/22/09   CYSTOSCOPY WITH BIOPSY N/A 02/10/2013   Procedure: CYSTOSCOPY WITH COLD CUP BIOPSY/FULGERATION;  Surgeon: Alm GORMAN Fragmin, MD;  Location: [facility];  Service: Urology;  Laterality: N/A;  ALSO FULGERATION    HIP PINNING,CANNULATED Left 03/28/2024   Procedure: FIXATION, FEMUR, NECK, PERCUTANEOUS, USING SCREW;  Surgeon: Edna Toribio LABOR, MD;  Location: [facility] OR;  Service: Orthopedics;  Laterality: Left;   INGUINAL HERNIA REPAIR Right 07-14-2002   INTRAMEDULLARY (IM) NAIL INTERTROCHANTERIC Right 12/12/2023   Procedure: FIXATION, FRACTURE, INTERTROCHANTERIC, WITH INTRAMEDULLARY ROD;  Surgeon: Kendal Franky SQUIBB, MD;  Location: [facility] OR;  Service: Orthopedics;  Laterality: Right;   LEFT HEART CATH AND CORONARY  ANGIOGRAPHY N/A 07/13/2019   Procedure: LEFT HEART CATH AND CORONARY ANGIOGRAPHY;  Surgeon: Burnard Debby LABOR, MD;  Location: [facility] INVASIVE CV LAB;  Service: Cardiovascular;  Laterality: N/A;   LOOP RECORDER IMPLANT N/A 05/24/2014   Procedure: LOOP RECORDER IMPLANT;  Surgeon: Jerel Balding, MD;  Location: [facility] CATH LAB;  Service: Cardiovascular;  Laterality: N/A;   LOOP RECORDER REMOVAL N/A 10/02/2020   Procedure: LOOP RECORDER REMOVAL;  Surgeon: Balding Jerel, MD;  Location: [facility] INVASIVE CV LAB;  Service: Cardiovascular;  Laterality: N/A;   NECK SURGERY     PACEMAKER IMPLANT N/A 10/02/2020   Procedure: PACEMAKER IMPLANT;  Surgeon: Balding Jerel, MD;  Location: [facility] INVASIVE CV LAB;  Service: Cardiovascular;  Laterality: N/A;   TONSILLECTOMY     TRANSURETHRAL RESECTION OF BLADDER TUMOR  03-10-2008   TRANSURETHRAL RESECTION OF BLADDER TUMOR Bilateral 12/28/2020   Procedure: TRANSURETHRAL RESECTION OF BLADDER TUMOR BLADDER BIOPSY FULGARATION BILATERAL RETROGRADE PYELOGRAM(TURBT);  Surgeon: Cam Morene ORN, MD;  Location: [facility] ORS;  Service: Urology;  Laterality: Bilateral;   TUMOR EXCISION Left    Left arm     reports that he quit smoking about 45 years ago. His smoking use included  cigarettes. He started smoking about 75 years ago. He has a 15 pack-year smoking history. He has never used smokeless tobacco. He reports current alcohol use. He reports that he does not use drugs.  Family History  Problem Relation Age of Onset   Heart failure Mother    Stroke Father    Diabetes Brother    Heart failure Brother      Physical Exam: Vitals:   03/31/24 2000 03/31/24 2030 04/01/24 0007 04/01/24 0248  BP: 131/83 126/74 95/85 125/73  Pulse: 77 78 93 71  Resp: 16  16   Temp:   97.7 F (36.5 C) 97.9 F (36.6 C)  TempSrc:   Axillary Oral  SpO2: 99% 99% 100% 100%    Gen: Awake, alert, chronically ill appearing  CV: Regular, normal S1, S2, 2/6 cresc-decres murmur Resp: Normal WOB, CTAB  Abd: Flat,  normoactive, nontender MSK: Symmetric, no edema  Skin: No rashes or lesions to exposed skin  Neuro: Alert and interactive, oriented to person, time, not to place [facility]) or situation  Psych: Encephalopathic    Data review:   Labs reviewed, notable for:  Chemistries and blood counts unremarkable BNP within normal limit, high-sensitivity troponin negative UA pending  Micro:  Results for orders placed or performed during the hospital encounter of 03/27/24  Surgical pcr screen     Status: None   Collection Time: 03/28/24  2:06 AM   Specimen: Nasal Mucosa; Nasal Swab  Result Value Ref Range Status   MRSA, PCR NEGATIVE NEGATIVE Final   Staphylococcus aureus NEGATIVE NEGATIVE Final    Comment: (NOTE) The Xpert SA Assay (FDA approved for NASAL specimens in patients 31 years of age and older), is one component of a comprehensive surveillance program. It is not intended to diagnose infection nor to guide or monitor treatment. Performed at [facility], [address], [place], [place] [address]     Imaging reviewed:  DG FEMUR PORT MIN 2 VIEWS LEFT Result Date: 04/01/2024 CLINICAL DATA:  Ground level fall EXAM: LEFT FEMUR PORTABLE 2 VIEWS COMPARISON:  Left hip 03/28/2024 FINDINGS: Three screws fix a fracture of the left femoral neck. Hardware appears intact. No change in alignment or position since the previous study. Degenerative changes in the left hip joint. Midshaft and distal femur are unremarkable. No acute fracture demonstrated. Degenerative changes in the knee. No significant effusion. Vascular calcifications. IMPRESSION: Screw fixation of a left femoral neck fracture. Degenerative changes in the left hip and left knee. No new acute bony abnormalities. Electronically Signed   By: Elsie Gravely M.D.   On: 04/01/2024 02:16   CT Head Wo Contrast Result Date: 03/31/2024 CLINICAL DATA:  Near syncopal episode, dizziness, mental status changes, unknown cause. EXAM: CT HEAD  WITHOUT CONTRAST TECHNIQUE: Contiguous axial images were obtained from the base of the skull through the vertex without intravenous contrast. RADIATION DOSE REDUCTION: This exam was performed according to the departmental dose-optimization program which includes automated exposure control, adjustment of the mA and/or kV according to patient size and/or use of iterative reconstruction technique. COMPARISON:  Recent head CT 03/27/2024. FINDINGS: Brain: There is mild cerebellar and mild-to-moderate cerebral atrophy with atrophic ventriculomegaly and moderately advanced small vessel disease of the cerebral white matter. No cortical based acute infarct, hemorrhage, mass or mass effect are seen. There are benign dural calcifications in the falx. No midline shift. Basal cisterns are clear. Vascular: There is calcific plaque in the carotid siphons. No hyperdense central vessel is seen. Skull: Negative  for fractures or focal lesions. Sinuses/Orbits: Negative orbits with prior lens replacements. Clear sinuses and right mastoids with trace fluid again in the left mastoid tip. Other: Left-sided deviation and spurring of the bony nasal septum. IMPRESSION: 1. No acute intracranial CT findings or interval changes. 2. Atrophy and small vessel disease. 3. Carotid atherosclerosis. 4. Trace fluid in the left mastoid tip, seen previously. Electronically Signed   By: Francis Quam M.D.   On: 03/31/2024 22:43    EKG:  AV paced  ED Course:  RFA for syncope   Assessment/Plan:  89 y.o. male with hx CVA, heart failure with preserved ejection fraction, pacemaker, mixed hypertension with recent orthostatic hypotension, hyperlipidemia, recurrent falls, right hip fracture requiring ORIF in 3/'25, and just admitted 7/6-7/9 with fall possibly syncopal c/b L hip femoral neck fracture and underwent ORIF on 7/6 with Dr. Edna. He was orthostatic but refused to stay for continued care. Went to his ILF and immediately had a witnessed  syncopal episode.   Syncopal episode, likely orthostatic -Give 1 L NS, check orthostatics in a.m. -Telemonitoring -Echo to eval for AS -Unsure how to order for pacemaker interrogation -Compression stockings and abdominal binder when out of bed. -Lasix  was changed prn and Flomax  discontinued last hospitalization -PT monitoring  Recent left femoral neck fracture, s/p ORIF 7/6 -X-ray to ensure no hardware complication -Continue aspirin  twice daily for DVT prophylaxis  Encephalopathy, mild Noted to be disoriented to place and situation but appears to be reorientable, normal alertness. Labs unremarkable. CT Head negative for acute.  -Check UA, anticipate was transient possibly underlying neurocognitive disorder with sundowning type phenomena  Chronic medical problems:  History of CVA: No longer taking statins continue, continue aspirin .   Chronic diastolic dysfunction: Diuretic prn at discharge   Pacemaker: Noted., should order for interrogation    Hyperlipidemia: Currently not on statin.   BPH: Continue finasteride , tamsulosin  was stopped.     GERD: Continue PPI.      There is no height or weight on file to calculate BMI.    DVT prophylaxis:  asa Code Status:  Full Code Diet:  Diet Orders (From admission, onward)     Start     Ordered   04/01/24 0143  Diet regular Room service appropriate? Yes; Fluid consistency: Thin  Diet effective now       Question Answer Comment  Room service appropriate? Yes   Fluid consistency: Thin      04/01/24 0146           Family Communication:  None   Consults:  None   Admission status:   Observation, med tele   Severity of Illness: The appropriate patient status for this patient is OBSERVATION. Observation status is judged to be reasonable and necessary in order to provide the required intensity of service to ensure the patient's safety. The patient's presenting symptoms, physical exam findings, and initial radiographic and  laboratory data in the context of their medical condition is felt to place them at decreased risk for further clinical deterioration. Furthermore, it is anticipated that the patient will be medically stable for discharge from the hospital within 2 midnights of admission.    Dorn Dawson, MD [facility]  How to contact the [facility] Attending or Consulting provider 7A - 7P or covering provider during after hours 7P -7A, for this patient.  Check the care team in [facility] and look for a) attending/consulting [facility] provider listed and b) the [facility] team listed Log into [email or web address] and use [facility]'s universal password to access. If you do not have the password, please contact the hospital operator. Locate the [facility] provider you are looking for under [facility] and page to a number that you can be directly reached. If you still have difficulty reaching the provider, please page the [facility] (Director on Call) for the Hospitalists listed on amion for assistance.  04/01/2024, 4:21 AM

## 2024-04-01 NOTE — Evaluation (Signed)
 Physical Therapy Evaluation Patient Details Name: Oscar [name] MRN: [id number] DOB: 07-Sep-1931 Today's Date: 04/01/2024  History of Present Illness  Patient is a 89 y/o male admitted 03/31/24 due to syncopal episode at home.  Just discharged same day after stay from fall and L hip femoral neck fracture s/p ORIF 7/6 and recent fall 11/2023 with R hip fracture/ORIF.  Other PMH HTN, GERD, h/o bladder CA, LBBB, L eye macular degeneration, CHB s/p PPM, h/o CVA.  Clinical Impression  Patient presents with decreased mobility due to L hip pain, generalized weakness, decreased balance and decreased safety awareness with decreased activity tolerance.  Previously walking with walker on his own, though since L hip fracture needing some help.  Living with his wife and 24 hour caregivers at [facility].  Currently CGA to S for mobility with BP measurements stable despite no abdominal binder this session (ordered via nursing).  Patient does however demonstrate vertical nystagmus which is sign of central vestibular dysfunction which may also be contributing to symptoms.  Further investigation and treatment indicated during stay or at home via HHPT.  Orthostatic VS for the past 24 hrs (Last 3 readings):  BP- Lying Pulse- Lying BP- Sitting Pulse- Sitting BP- Standing at 0 minutes Pulse- Standing at 0 minutes BP- Standing at 3 minutes Pulse- Standing at 3 minutes  04/01/24 1300 122/66 88 117/74 93 94/54 99 101/84 101      Vestibular Assessment - 04/01/24 0001       Symptom Behavior   Subjective history of current problem reports dizziness as a chonic problem, not sure how long, two recent falls with hip fx (March R hip and July L hip).  Describes as things moving at times and light headedness.    Type of Dizziness  Spinning;Lightheadedness;Funny feeling in head    Frequency of Dizziness intermittent    Duration of Dizziness several seconds    Symptom Nature Motion provoked;Variable;Intermittent    Aggravating  Factors Activity in general;Turning head quickly;Supine to sit    Relieving Factors Lying supine;Slow movements    Progression of Symptoms No change since onset      Oculomotor Exam   Oculomotor Alignment Normal      Positional Testing   Dix-Hallpike Dix-Hallpike Right;Dix-Hallpike Left    Horizontal Canal Testing Horizontal Canal Right;Horizontal Canal Left      Dix-Hallpike Right   Dix-Hallpike Right Duration 1 minute,    Dix-Hallpike Right Symptoms No nystagmus      Dix-Hallpike Left   Dix-Hallpike Left Duration 1 min    Dix-Hallpike Left Symptoms No nystagmus      Horizontal Canal Right   Horizontal Canal Right Duration 1 min    Horizontal Canal Right Symptoms Normal      Horizontal Canal Left   Horizontal Canal Left Duration 1 min    Horizontal Canal Left Symptoms Nystagmus   downbeating lasting about 15-20 sec                If plan is discharge home, recommend the following: A little help with walking and/or transfers;A little help with bathing/dressing/bathroom;Assist for transportation;Help with stairs or ramp for entrance;Assistance with cooking/housework   Can travel by private vehicle   Yes    Equipment Recommendations None recommended by PT  Recommendations for Other Services       Functional Status Assessment Patient has had a recent decline in their functional status and demonstrates the ability to make significant improvements in function in a reasonable and predictable amount of  time.     Precautions / Restrictions Precautions Precautions: Fall Precaution/Restrictions Comments: watch BP; abdominal binder and TEDS Restrictions LLE Weight Bearing Per Provider Order: Weight bearing as tolerated      Mobility  Bed Mobility Overal bed mobility: Needs Assistance Bed Mobility: Supine to Sit, Sit to Supine     Supine to sit: Min assist Sit to supine: Contact guard assist   General bed mobility comments: assist for balance sitting up to lift  trunk and to supine for positioning    Transfers Overall transfer level: Needs assistance Equipment used: Rolling walker (2 wheels) Transfers: Sit to/from Stand Sit to Stand: Contact guard assist           General transfer comment: cues for hand placement    Ambulation/Gait Ambulation/Gait assistance: Supervision, Contact guard assist Gait Distance (Feet): 100 Feet Assistive device: Rolling walker (2 wheels) Gait Pattern/deviations: Step-through pattern, Decreased stride length       General Gait Details: managing walker safely though close guard for symptoms of dizziness  Stairs            Wheelchair Mobility     Tilt Bed    Modified Rankin (Stroke Patients Only)       Balance Overall balance assessment: Needs assistance   Sitting balance-Leahy Scale: Good     Standing balance support: Bilateral upper extremity supported, Reliant on assistive device for balance Standing balance-Leahy Scale: Poor Standing balance comment: UE support for balance                             Pertinent Vitals/Pain Pain Assessment Pain Assessment: Faces Faces Pain Scale: Hurts a little bit Pain Location: L hip Pain Descriptors / Indicators: Discomfort Pain Intervention(s): Monitored during session    Home Living Family/patient expects to be discharged to:: Private residence ([facility]) Living Arrangements: Spouse/significant other Available Help at Discharge: Family;Available 24 hours/day;Home health Type of Home: Independent living facility Home Access: Level entry       Home Layout: One level Home Equipment: Shower seat - built Char[employer] fundraiser (2 wheels);Cane - single point;Grab bars - toilet;Grab bars - tub/shower;Hand held shower head Additional Comments: normally showers on his own though not since hip fx    Prior Function               Mobility Comments: using walker since hip fx, not home long enough without dizziness to see if able to get up  on his own ADLs Comments: Does not cook any meals. Can have meals brought in if needed. Typically goes down to the dining room.     Extremity/Trunk Assessment   Upper Extremity Assessment Upper Extremity Assessment: Overall WFL for tasks assessed    Lower Extremity Assessment Lower Extremity Assessment: LLE deficits/detail LLE Deficits / Details: moves WFL though slow and painful, strength NT    Cervical / Trunk Assessment Cervical / Trunk Assessment: Kyphotic  Communication   Communication Communication: No apparent difficulties    Cognition Arousal: Alert Behavior During Therapy: WFL for tasks assessed/performed   PT - Cognitive impairments: No family/caregiver present to determine baseline, Memory                       PT - Cognition Comments: mostly oriented, reporting 1965, then corrected to 2025 with gesturing cues, during conversation on how long since his stroke, using 62 instead of 2022 Following commands: Intact       Cueing  Cueing Techniques: Verbal cues     General Comments General comments (skin integrity, edema, etc.): see flowsheets for BP measurements, wearing TEDs but abdominal binder at home, asked nursing to order one; discussed using wheelchair at home to prevent feeling dizziness; wife and caregiver arrived end of session and caregiver affirms pt statement they have wheelchairs available to use at the facility    Exercises     Assessment/Plan    PT Assessment Patient needs continued PT services  PT Problem List Decreased strength;Decreased activity tolerance;Decreased balance;Decreased mobility;Decreased knowledge of precautions;Decreased safety awareness;Decreased knowledge of use of DME;Pain       PT Treatment Interventions DME instruction;Gait training;Functional mobility training;Therapeutic activities;Therapeutic exercise;Balance training;Patient/family education;Wheelchair mobility training    PT Goals (Current goals can be found  in the Care Plan section)  Acute Rehab PT Goals Patient Stated Goal: home as soon as able PT Goal Formulation: With patient Time For Goal Achievement: 04/15/24 Potential to Achieve Goals: Fair    Frequency Min 2X/week     Co-evaluation               AM-PAC PT 6 Clicks Mobility  Outcome Measure Help needed turning from your back to your side while in a flat bed without using bedrails?: A Little Help needed moving from lying on your back to sitting on the side of a flat bed without using bedrails?: A Little Help needed moving to and from a bed to a chair (including a wheelchair)?: A Little Help needed standing up from a chair using your arms (e.g., wheelchair or bedside chair)?: A Little Help needed to walk in hospital room?: A Little Help needed climbing 3-5 steps with a railing? : A Lot 6 Click Score: 17    End of Session Equipment Utilized During Treatment: Gait belt Activity Tolerance: Patient tolerated treatment well Patient left: in bed;with bed alarm set;with call bell/phone within reach;with family/visitor present   PT Visit Diagnosis: History of falling (Z91.81);Other abnormalities of gait and mobility (R26.89);Dizziness and giddiness (R42);Pain Pain - Right/Left: Left Pain - part of body: Hip    Time: 1219-1254 PT Time Calculation (min) (ACUTE ONLY): 35 min   Charges:   PT Evaluation $PT Eval Moderate Complexity: 1 Mod PT Treatments $Gait Training: 8-22 mins PT General Charges $$ ACUTE PT VISIT: 1 Visit         Micheline Portal, PT Acute Rehabilitation Services [phone] 04/01/2024   Montie Portal 04/01/2024, 1:46 PM

## 2024-04-01 NOTE — Care Management Obs Status (Signed)
 MEDICARE OBSERVATION STATUS NOTIFICATION   Patient Details  Name: Oscar [name] MRN: [id number] Date of Birth: [birth year]   Medicare Observation Status Notification Given:  Yes  Obs letter signed and copy given   Claretta Deed 04/01/2024, 10:53 AM

## 2024-04-01 NOTE — Progress Notes (Signed)
  Echocardiogram 2D Echocardiogram has been performed.  Tinnie FORBES Gosling RDCS 04/01/2024, 9:09 AM

## 2024-04-01 NOTE — Progress Notes (Signed)
 Patient seen and examined.  Admitted on the morning hours by nighttime hospitalist.  Patient himself denies any complaints today.  He needs to go home as his wife has dementia and also it is their 66th  anniversary tomorrow.  89 year old gentleman with history of orthostatic hypotension, hyperlipidemia, recent right hip fracture requiring ORIF on 11/2023, patient admitted 7/6-7/9 with left hip femoral neck fracture and underwent ORIF.  He also has sick sinus syndrome and has a pacemaker.  After procedure, he was still significantly orthostatic but insisted on going home.  Lasix  and Flomax  were discontinued.  Patient went home, was trying to walk, got dizzy and lightheaded and fell on the left hip.  Blood pressure was low as per EMS.  He had improved by the time he came to the ER.   Idiopathic  orthostatic hypotension in a patient with known orthostatic hypotension: Aggravated by recent surgery and debility. Flomax  and Lasix  discontinued. Currently hemodynamically stable. Given 1 L of IV fluids overnight. Continue to work with PT OT every day.  Alt time fall precautions.  Orthostatic precautions. Compression stockings. Midodrine  5 mg twice daily by Hopefully  he can safely mobilize so we can discharge him on midodrine  and home health.   Same-day admit.  No charge visit.

## 2024-04-02 DIAGNOSIS — I951 Orthostatic hypotension: Secondary | ICD-10-CM

## 2024-04-02 MED ORDER — MIDODRINE HCL 5 MG PO TABS: 5.0000 mg | ORAL_TABLET | Freq: Two times a day (BID) | ORAL | 0 refills | Status: DC

## 2024-04-02 NOTE — Discharge Summary (Signed)
 Physician Discharge Summary  Oscar [name] FMW:[id number] DOB: July 10, 1931 DOA: 03/31/2024  PCP: Frann Mabel Mt, DO  Admit date: 03/31/2024 Discharge date: 04/02/2024  Admitted From: Home Disposition: Home with home health  Recommendations for Outpatient Follow-up:  Follow up with PCP in 1-2 weeks Please obtain BMP/CBC in one week Orthopedics to schedule follow-up  Home Health: PT/OT Equipment/Devices: Walker  Discharge Condition: Stable CODE STATUS: Full code Diet recommendation: Regular diet, nutritional supplements  Discharge summary: 89 year old gentleman with history of orthostatic hypotension, hyperlipidemia, recent right hip fracture requiring ORIF on 11/2023, patient admitted 7/6-7/9 with left hip femoral neck fracture and underwent ORIF.  He also has sick sinus syndrome and has a pacemaker.  After procedure, he was still significantly orthostatic but insisted on going home.  Lasix  and Flomax  were discontinued.  Patient went home, was trying to walk, got dizzy and lightheaded and fell on the left hip.  Blood pressure was low as per EMS.  He had improved by the time he came to the ER.     Idiopathic  orthostatic hypotension in a patient with known orthostatic hypotension: Aggravated by recent surgery and debility. Flomax  and Lasix  discontinued.  Started on midodrine  5 mg twice daily. Today, he is negative for orthostatic symptoms though his blood pressure drops 10-20 points.  He is able to mobilize around. Continue compression socks. Discussed about orthostatic precautions and fall precautions and patient is aware. Going home with midodrine , compression socks, discontinuing Flomax  and diuretics.  Discharge Diagnoses:  Principal Problem:   Syncope    Discharge Instructions  Discharge Instructions     Diet general   Complete by: As directed    Increase activity slowly   Complete by: As directed    No wound care   Complete by: As directed       Allergies as  of 04/02/2024   No Known Allergies      Medication List     STOP taking these medications    furosemide  20 MG tablet Commonly known as: LASIX        TAKE these medications    acetaminophen  500 MG tablet Commonly known as: TYLENOL  Take 2 tablets (1,000 mg total) by mouth every 8 (eight) hours. What changed:  when to take this reasons to take this   aspirin  EC 81 MG tablet Take 1 tablet (81 mg total) by mouth 2 (two) times daily for 28 days. Swallow whole.   atorvastatin  40 MG tablet Commonly known as: LIPITOR Take 1 tablet (40 mg total) by mouth daily.   ferrous sulfate  325 (65 FE) MG EC tablet Take 1 tablet (325 mg total) by mouth daily with breakfast.   finasteride  5 MG tablet Commonly known as: Proscar  Take 1 tablet (5 mg total) by mouth daily.   midodrine  5 MG tablet Commonly known as: PROAMATINE  Take 1 tablet (5 mg total) by mouth 2 (two) times daily with a meal.   multivitamin with minerals Tabs tablet Take 1 tablet by mouth daily.   pantoprazole  40 MG tablet Commonly known as: PROTONIX  Take 1 tablet (40 mg total) by mouth daily.   Vitamin D -3 125 MCG (5000 UT) Tabs Take 5,000 Units by mouth daily.        Follow-up Information     [facility]   Contact information: [phone]               No Known Allergies  Consultations: None   Procedures/Studies: ECHOCARDIOGRAM COMPLETE Result Date: 04/01/2024  ECHOCARDIOGRAM REPORT   Patient Name:   Oscar [name] Date of Exam: 04/01/2024 Medical Rec #:  [id number]       Height:       68.0 in Accession #:    [id number]      Weight:       142.8 lb Date of Birth:  June 24, 1931        BSA:          1.771 m Patient Age:    89 years        BP:           125/75 mmHg Patient Gender: M               HR:           77 bpm. Exam Location:  Inpatient Procedure: 2D Echo, Color Doppler and Cardiac Doppler (Both Spectral and Color            Flow Doppler were utilized during procedure). Indications:     Syncope R55  History:        Patient has prior history of Echocardiogram examinations, most                 recent 03/12/2020.  Sonographer:    Tinnie Gosling RDCS Referring Phys: [id number] DORN DAWSON IMPRESSIONS  1. Left ventricular ejection fraction, by estimation, is 60 to 65%. The left ventricle has normal function. The left ventricle has no regional wall motion abnormalities. There is mild concentric left ventricular hypertrophy. Left ventricular diastolic parameters are consistent with Grade I diastolic dysfunction (impaired relaxation).  2. Right ventricular systolic function is normal. The right ventricular size is normal.  3. The mitral valve is normal in structure. No evidence of mitral valve regurgitation. No evidence of mitral stenosis.  4. The aortic valve is tricuspid. There is mild calcification of the aortic valve. Aortic valve regurgitation is not visualized. Aortic valve sclerosis/calcification is present, without any evidence of aortic stenosis.  5. Aortic dilatation noted. There is mild dilatation of the ascending aorta, measuring 41 mm.  6. The inferior vena cava is normal in size with greater than 50% respiratory variability, suggesting right atrial pressure of 3 mmHg. FINDINGS  Left Ventricle: Left ventricular ejection fraction, by estimation, is 60 to 65%. The left ventricle has normal function. The left ventricle has no regional wall motion abnormalities. The left ventricular internal cavity size was normal in size. There is  mild concentric left ventricular hypertrophy. Abnormal (paradoxical) septal motion, consistent with RV pacemaker. Left ventricular diastolic parameters are consistent with Grade I diastolic dysfunction (impaired relaxation). Right Ventricle: The right ventricular size is normal. No increase in right ventricular wall thickness. Right ventricular systolic function is normal. Left Atrium: Left atrial size was normal in size. Right Atrium: Right atrial size was normal in  size. Pericardium: There is no evidence of pericardial effusion. Mitral Valve: The mitral valve is normal in structure. No evidence of mitral valve regurgitation. No evidence of mitral valve stenosis. Tricuspid Valve: The tricuspid valve is normal in structure. Tricuspid valve regurgitation is not demonstrated. No evidence of tricuspid stenosis. Aortic Valve: The aortic valve is tricuspid. There is mild calcification of the aortic valve. Aortic valve regurgitation is not visualized. Aortic valve sclerosis/calcification is present, without any evidence of aortic stenosis. Pulmonic Valve: The pulmonic valve was not well visualized. Pulmonic valve regurgitation is not visualized. No evidence of pulmonic stenosis. Aorta: Aortic dilatation noted. There is mild dilatation of the ascending aorta, measuring 41  mm. Venous: The inferior vena cava is normal in size with greater than 50% respiratory variability, suggesting right atrial pressure of 3 mmHg. IAS/Shunts: No atrial level shunt detected by color flow Doppler. Additional Comments: A device lead is visualized.  LEFT VENTRICLE PLAX 2D LVIDd:         3.20 cm   Diastology LVIDs:         2.10 cm   LV e' medial:    4.46 cm/s LV PW:         1.20 cm   LV E/e' medial:  13.7 LV IVS:        1.10 cm   LV e' lateral:   9.68 cm/s LVOT diam:     2.00 cm   LV E/e' lateral: 6.3 LV SV:         45 LV SV Index:   25 LVOT Area:     3.14 cm  RIGHT VENTRICLE         IVC TAPSE (M-mode): 1.3 cm  IVC diam: 1.70 cm LEFT ATRIUM           Index LA diam:      2.80 cm 1.58 cm/m LA Vol (A4C): 28.7 ml 16.20 ml/m  AORTIC VALVE LVOT Vmax:   86.20 cm/s LVOT Vmean:  60.800 cm/s LVOT VTI:    0.142 m  AORTA Ao Root diam: 3.20 cm Ao Asc diam:  4.10 cm MITRAL VALVE MV Area (PHT): 4.15 cm     SHUNTS MV Decel Time: 183 msec     Systemic VTI:  0.14 m MV E velocity: 61.30 cm/s   Systemic Diam: 2.00 cm MV A velocity: 109.00 cm/s MV E/A ratio:  0.56 Toribio Fuel MD Electronically signed by Toribio Fuel  MD Signature Date/Time: 04/01/2024/9:29:24 AM    Final    DG FEMUR PORT MIN 2 VIEWS LEFT Result Date: 04/01/2024 CLINICAL DATA:  Ground level fall EXAM: LEFT FEMUR PORTABLE 2 VIEWS COMPARISON:  Left hip 03/28/2024 FINDINGS: Three screws fix a fracture of the left femoral neck. Hardware appears intact. No change in alignment or position since the previous study. Degenerative changes in the left hip joint. Midshaft and distal femur are unremarkable. No acute fracture demonstrated. Degenerative changes in the knee. No significant effusion. Vascular calcifications. IMPRESSION: Screw fixation of a left femoral neck fracture. Degenerative changes in the left hip and left knee. No new acute bony abnormalities. Electronically Signed   By: Elsie Gravely M.D.   On: 04/01/2024 02:16   CT Head Wo Contrast Result Date: 03/31/2024 CLINICAL DATA:  Near syncopal episode, dizziness, mental status changes, unknown cause. EXAM: CT HEAD WITHOUT CONTRAST TECHNIQUE: Contiguous axial images were obtained from the base of the skull through the vertex without intravenous contrast. RADIATION DOSE REDUCTION: This exam was performed according to the departmental dose-optimization program which includes automated exposure control, adjustment of the mA and/or kV according to patient size and/or use of iterative reconstruction technique. COMPARISON:  Recent head CT 03/27/2024. FINDINGS: Brain: There is mild cerebellar and mild-to-moderate cerebral atrophy with atrophic ventriculomegaly and moderately advanced small vessel disease of the cerebral white matter. No cortical based acute infarct, hemorrhage, mass or mass effect are seen. There are benign dural calcifications in the falx. No midline shift. Basal cisterns are clear. Vascular: There is calcific plaque in the carotid siphons. No hyperdense central vessel is seen. Skull: Negative for fractures or focal lesions. Sinuses/Orbits: Negative orbits with prior lens replacements. Clear  sinuses and right mastoids with trace fluid  again in the left mastoid tip. Other: Left-sided deviation and spurring of the bony nasal septum. IMPRESSION: 1. No acute intracranial CT findings or interval changes. 2. Atrophy and small vessel disease. 3. Carotid atherosclerosis. 4. Trace fluid in the left mastoid tip, seen previously. Electronically Signed   By: Francis Quam M.D.   On: 03/31/2024 22:43   DG HIP UNILAT WITH PELVIS 2-3 VIEWS LEFT Result Date: 03/28/2024 CLINICAL DATA:  Subcapital left femoral neck fracture status post ORIF EXAM: DG HIP (WITH OR WITHOUT PELVIS) 2-3V LEFT COMPARISON:  03/27/2024 FINDINGS: Frontal view of the pelvis as well as frontal and cross-table lateral views of the left hip are obtained. Chronic postsurgical changes from prior right hip ORIF. Interval placement of 3 cannulated screws traversing the acute subcapital left femoral neck fracture, with near anatomic alignment of the fracture site. Remainder of the bony pelvis is unremarkable. IMPRESSION: 1. Interval pinning of the subcapital left femoral neck fracture, with near anatomic alignment. Electronically Signed   By: Ozell Daring M.D.   On: 03/28/2024 13:10   DG HIP UNILAT WITH PELVIS 2-3 VIEWS LEFT Result Date: 03/28/2024 CLINICAL DATA:  Left hip fracture repair EXAM: DG HIP (WITH OR WITHOUT PELVIS) 2-3V LEFT COMPARISON:  03/27/2024 FINDINGS: 6 fluoroscopic images are obtained during the performance of the procedure and are provided for interpretation only. Three cannulated screws are seen traversing the subcapital left femoral neck fracture, with near anatomic alignment of the fracture site. Please refer to the operative report. Fluoroscopy time: 58 seconds, 8.89 mGy IMPRESSION: 1. ORIF of a subcapital left femoral neck fracture as above. Electronically Signed   By: Ozell Daring M.D.   On: 03/28/2024 13:09   DG C-Arm 1-60 Min-No Report Result Date: 03/28/2024 Fluoroscopy was utilized by the requesting physician.  No  radiographic interpretation.   CT HIP LEFT WO CONTRAST Result Date: 03/27/2024 CLINICAL DATA:  Left hip fracture EXAM: CT OF THE LEFT HIP WITHOUT CONTRAST TECHNIQUE: Multidetector CT imaging of the left hip was performed according to the standard protocol. Multiplanar CT image reconstructions were also generated. RADIATION DOSE REDUCTION: This exam was performed according to the departmental dose-optimization program which includes automated exposure control, adjustment of the mA and/or kV according to patient size and/or use of iterative reconstruction technique. COMPARISON:  Left hip radiographs 03/27/2024 FINDINGS: Bones/Joint/Cartilage Transverse fracture of the subcapital left femoral neck with impaction of the fracture fragments. Mild valgus angulation. No inter trochanteric involvement. No dislocation of the hip joint. Degenerative changes in the hip joint with osteophyte formation on both sides of the joint. No significant effusion. Ligaments Suboptimally assessed by CT. Muscles and Tendons No intramuscular mass. Small intramuscular hematoma suggested in the anterior iliac muscle. Soft tissues No significant soft tissue infiltration or collection. Visualized pelvic organs appear intact. IMPRESSION: 1. Transverse fracture of the subcapital left femoral neck with impaction and mild valgus angulation. No inter trochanteric involvement. 2. Small anterior intramuscular hematoma. Electronically Signed   By: Elsie Gravely M.D.   On: 03/27/2024 19:42   DG Chest 1 View Result Date: 03/27/2024 CLINICAL DATA:  Fall EXAM: CHEST  1 VIEW COMPARISON:  12/11/2023 FINDINGS: Left-sided pacing device as before. No acute airspace disease or effusion. Stable cardiomediastinal silhouette with aortic atherosclerosis. No pneumothorax IMPRESSION: No active disease. Electronically Signed   By: Luke Bun M.D.   On: 03/27/2024 19:03   DG HIP UNILAT WITH PELVIS 2-3 VIEWS LEFT Result Date: 03/27/2024 CLINICAL DATA:  Left hip  pain since falling last night.  EXAM: DG HIP (WITH OR WITHOUT PELVIS) 2-3V LEFT COMPARISON:  Pelvic and left hip radiographs 11/20/2004. Right hip radiographs 12/12/2023. Pelvic CT 09/19/2021. FINDINGS: The bones are demineralized. New, presumably acute subcapital fracture of the left femoral neck with mild impaction. The proximal right femur has a stable appearance status post intramedullary nail and dynamic screw fixation of the previously demonstrated intertrochanteric fracture. No pelvic fracture or dislocation identified. Minimal hip degenerative changes for age. IMPRESSION: 1. New, presumably acute subcapital fracture of the left femoral neck. 2. Stable appearance of the proximal right femur status post ORIF. Electronically Signed   By: Elsie Perone M.D.   On: 03/27/2024 18:01   CT Head Wo Contrast Result Date: 03/27/2024 CLINICAL DATA:  Dizzy spell with fall this morning EXAM: CT HEAD WITHOUT CONTRAST CT CERVICAL SPINE WITHOUT CONTRAST TECHNIQUE: Multidetector CT imaging of the head and cervical spine was performed following the standard protocol without intravenous contrast. Multiplanar CT image reconstructions of the cervical spine were also generated. RADIATION DOSE REDUCTION: This exam was performed according to the departmental dose-optimization program which includes automated exposure control, adjustment of the mA and/or kV according to patient size and/or use of iterative reconstruction technique. COMPARISON:  None Available. FINDINGS: CT HEAD FINDINGS Brain: No evidence of acute infarction, hemorrhage, hydrocephalus, extra-axial collection or mass lesion/mass effect. Periventricular white matter hypodensity. Vascular: No hyperdense vessel or unexpected calcification. Skull: Normal. Negative for fracture or focal lesion. Sinuses/Orbits: No acute finding. Other: None. CT CERVICAL SPINE FINDINGS Alignment: Postoperative listhesis of C4-C5. Skull base and vertebrae: No acute fracture. No primary bone  lesion or focal pathologic process. Soft tissues and spinal canal: No prevertebral fluid or swelling. No visible canal hematoma. Disc levels: Status post anterior cervical discectomy and fusion of C4-C5 with ankylosis of C3-C6 and severe disc degenerative change of C6-C7. Upper chest: Negative. Other: None. IMPRESSION: 1. No acute intracranial pathology. Small-vessel white matter disease in keeping with advanced patient age. 2. No fracture or static subluxation of the cervical spine. 3. Status post anterior cervical discectomy and fusion of C4-C5 with ankylosis of C3-C6 and severe disc degenerative change of C6-C7. Electronically Signed   By: Marolyn JONETTA Jaksch M.D.   On: 03/27/2024 17:59   CT Cervical Spine Wo Contrast Result Date: 03/27/2024 CLINICAL DATA:  Dizzy spell with fall this morning EXAM: CT HEAD WITHOUT CONTRAST CT CERVICAL SPINE WITHOUT CONTRAST TECHNIQUE: Multidetector CT imaging of the head and cervical spine was performed following the standard protocol without intravenous contrast. Multiplanar CT image reconstructions of the cervical spine were also generated. RADIATION DOSE REDUCTION: This exam was performed according to the departmental dose-optimization program which includes automated exposure control, adjustment of the mA and/or kV according to patient size and/or use of iterative reconstruction technique. COMPARISON:  None Available. FINDINGS: CT HEAD FINDINGS Brain: No evidence of acute infarction, hemorrhage, hydrocephalus, extra-axial collection or mass lesion/mass effect. Periventricular white matter hypodensity. Vascular: No hyperdense vessel or unexpected calcification. Skull: Normal. Negative for fracture or focal lesion. Sinuses/Orbits: No acute finding. Other: None. CT CERVICAL SPINE FINDINGS Alignment: Postoperative listhesis of C4-C5. Skull base and vertebrae: No acute fracture. No primary bone lesion or focal pathologic process. Soft tissues and spinal canal: No prevertebral fluid or  swelling. No visible canal hematoma. Disc levels: Status post anterior cervical discectomy and fusion of C4-C5 with ankylosis of C3-C6 and severe disc degenerative change of C6-C7. Upper chest: Negative. Other: None. IMPRESSION: 1. No acute intracranial pathology. Small-vessel white matter disease in keeping with advanced patient  age. 2. No fracture or static subluxation of the cervical spine. 3. Status post anterior cervical discectomy and fusion of C4-C5 with ankylosis of C3-C6 and severe disc degenerative change of C6-C7. Electronically Signed   By: Marolyn JONETTA Jaksch M.D.   On: 03/27/2024 17:59   (Echo, Carotid, EGD, Colonoscopy, ERCP)    Subjective: Patient seen and examined.  No overnight events.  He has mild pain on the surgical area on the left hip otherwise he is able to get up by himself in a wheeled walker without dizziness lightheadedness.  Wants to go home.   Discharge Exam: Vitals:   04/02/24 0449 04/02/24 0900  BP: 96/81 120/81  Pulse: 68 81  Resp: 19 18  Temp: (!) 97.5 F (36.4 C) 98.1 F (36.7 C)  SpO2: 98% 100%   Vitals:   04/01/24 2110 04/02/24 0030 04/02/24 0449 04/02/24 0900  BP: 104/66 123/68 96/81 120/81  Pulse: 74 65 68 81  Resp: 20 20 19 18   Temp: 98.4 F (36.9 C) 97.6 F (36.4 C) (!) 97.5 F (36.4 C) 98.1 F (36.7 C)  TempSrc:  Oral    SpO2: 96% 95% 98% 100%    General: Pt is alert, awake, not in acute distress Walking with a walker. Cardiovascular: RRR, S1/S2 +, no rubs, no gallops, left precordial pacemaker present. Respiratory: CTA bilaterally, no wheezing, no rhonchi Abdominal: Soft, NT, ND, bowel sounds + Extremities: no edema, no cyanosis Left hip incisions clean and dry.    The results of significant diagnostics from this hospitalization (including imaging, microbiology, ancillary and laboratory) are listed below for reference.     Microbiology: Recent Results (from the past 240 hours)  Surgical pcr screen     Status: None   Collection  Time: 03/28/24  2:06 AM   Specimen: Nasal Mucosa; Nasal Swab  Result Value Ref Range Status   MRSA, PCR NEGATIVE NEGATIVE Final   Staphylococcus aureus NEGATIVE NEGATIVE Final    Comment: (NOTE) The Xpert SA Assay (FDA approved for NASAL specimens in patients 63 years of age and older), is one component of a comprehensive surveillance program. It is not intended to diagnose infection nor to guide or monitor treatment. Performed at [facility], [address], [place], [place] [address]      Labs: BNP (last 3 results) Recent Labs    03/31/24 2128  BNP 53.3   Basic Metabolic Panel: Recent Labs  Lab 03/27/24 1640 03/28/24 0507 03/29/24 0513 03/31/24 0850 03/31/24 2128  NA 138 137 136 137 136  K 4.4 4.1 4.8 4.1 4.3  CL 103 107 103 101 97*  CO2 22 18* 24 23 25   GLUCOSE 103* 83 108* 106* 108*  BUN 23 16 22 22  26*  CREATININE 1.03 1.04 1.16 0.94 1.11  CALCIUM  9.7 8.8* 8.8* 9.4 9.3  MG  --  2.0  --   --   --   PHOS  --  3.7  --   --   --    Liver Function Tests: Recent Labs  Lab 03/27/24 1640  AST 18  ALT 13  ALKPHOS 109  BILITOT 0.3  PROT 6.9  ALBUMIN  4.0   No results for input(s): LIPASE, AMYLASE in the last 168 hours. No results for input(s): AMMONIA in the last 168 hours. CBC: Recent Labs  Lab 03/27/24 1640 03/28/24 0507 03/29/24 0513 03/31/24 0850 03/31/24 2128  WBC 7.6 5.7 10.9* 6.3 9.2  NEUTROABS  --   --   --   --  7.4  HGB 12.1* 10.7* 11.3* 12.3* 13.1  HCT 38.1* 35.1* 35.8* 38.4* 41.2  MCV 88.4 91.9 89.7 88.1 89.2  PLT 178 126* 203 256 228   Cardiac Enzymes: No results for input(s): CKTOTAL, CKMB, CKMBINDEX, TROPONINI in the last 168 hours. BNP: Invalid input(s): POCBNP CBG: No results for input(s): GLUCAP in the last 168 hours. D-Dimer No results for input(s): DDIMER in the last 72 hours. Hgb A1c No results for input(s): HGBA1C in the last 72 hours. Lipid Profile No results for input(s): CHOL, HDL,  LDLCALC, TRIG, CHOLHDL, LDLDIRECT in the last 72 hours. Thyroid  function studies No results for input(s): TSH, T4TOTAL, T3FREE, THYROIDAB in the last 72 hours.  Invalid input(s): FREET3 Anemia work up No results for input(s): VITAMINB12, FOLATE, FERRITIN, TIBC, IRON, RETICCTPCT in the last 72 hours. Urinalysis    Component Value Date/Time   COLORURINE YELLOW 03/31/2024 2355   APPEARANCEUR HAZY (A) 03/31/2024 2355   LABSPEC 1.017 03/31/2024 2355   PHURINE 5.0 03/31/2024 2355   GLUCOSEU NEGATIVE 03/31/2024 2355   HGBUR NEGATIVE 03/31/2024 2355   BILIRUBINUR NEGATIVE 03/31/2024 2355   KETONESUR 20 (A) 03/31/2024 2355   PROTEINUR NEGATIVE 03/31/2024 2355   UROBILINOGEN 0.2 06/22/2007 0800   NITRITE NEGATIVE 03/31/2024 2355   LEUKOCYTESUR NEGATIVE 03/31/2024 2355   Sepsis Labs Recent Labs  Lab 03/28/24 0507 03/29/24 0513 03/31/24 0850 03/31/24 2128  WBC 5.7 10.9* 6.3 9.2   Microbiology Recent Results (from the past 240 hours)  Surgical pcr screen     Status: None   Collection Time: 03/28/24  2:06 AM   Specimen: Nasal Mucosa; Nasal Swab  Result Value Ref Range Status   MRSA, PCR NEGATIVE NEGATIVE Final   Staphylococcus aureus NEGATIVE NEGATIVE Final    Comment: (NOTE) The Xpert SA Assay (FDA approved for NASAL specimens in patients 67 years of age and older), is one component of a comprehensive surveillance program. It is not intended to diagnose infection nor to guide or monitor treatment. Performed at [facility], [address], [place], [place] [address]      Time coordinating discharge: 32 minutes  SIGNED:   Renato Applebaum, MD  [facility] 04/02/2024, 10:37 AM

## 2024-04-02 NOTE — Plan of Care (Signed)

## 2024-04-02 NOTE — TOC Transition Note (Addendum)
 Transition of Care [facility]) - Discharge Note   Patient Details  Name: Oscar [name] MRN: [id number] Date of Birth: 1930-11-10  Transition of Care [facility]) CM/SW Contact:  Rosaline JONELLE Joe, RN Phone Number: 04/02/2024, 10:48 AM   Clinical Narrative:    CM spoke with Alfonso, Care manager and she is aware that patient will be discharged to home later this morning.  The care manager is unable to provide transportation to home today and asked that PTAR transport be set up for the patient.  I spoke with Dr. Raenelle, and he plans to discharge the patient this morning.  04/02/2024 1119 - CM called and spoke with Alfonso Holms, CM for [facility] and updated her regarding patient's discharge back home to [facility].  PTAR was requested by the care manager since patient was recent femur fx.  PTAr was called for tranport.  Discharge summary was faxed to [facility] Care management - [phone] and Wolm, CM with [facility] [facility] - [phone].  PTAR transport will be set up once discharge paperwork is completed.         Patient Goals and CMS Choice            Discharge Placement                       Discharge Plan and Services Additional resources added to the After Visit Summary for                                       Social Drivers of Health (SDOH) Interventions SDOH Screenings   Food Insecurity: No Food Insecurity (04/01/2024)  Housing: Low Risk  (04/01/2024)  Transportation Needs: No Transportation Needs (04/01/2024)  Utilities: Not At Risk (04/01/2024)  Depression (PHQ2-9): Low Risk  (11/19/2023)  Social Connections: Socially Integrated (04/01/2024)  Tobacco Use: Medium Risk (04/01/2024)     Readmission Risk Interventions     No data to display

## 2024-04-07 MED FILL — Vancomycin HCl For IV Soln 500 MG (Base Equivalent): INTRAVENOUS | Qty: 10 | Status: AC

## 2024-04-08 ENCOUNTER — Encounter (HOSPITAL_COMMUNITY): Payer: Self-pay | Admitting: Orthopedic Surgery

## 2024-04-13 ENCOUNTER — Ambulatory Visit (HOSPITAL_BASED_OUTPATIENT_CLINIC_OR_DEPARTMENT_OTHER): Attending: Cardiology

## 2024-04-27 ENCOUNTER — Ambulatory Visit: Admitting: Family Medicine

## 2024-05-04 ENCOUNTER — Inpatient Hospital Stay (HOSPITAL_COMMUNITY)

## 2024-05-04 ENCOUNTER — Other Ambulatory Visit: Payer: Self-pay

## 2024-05-04 ENCOUNTER — Emergency Department (HOSPITAL_COMMUNITY)

## 2024-05-04 ENCOUNTER — Ambulatory Visit: Admitting: Family Medicine

## 2024-05-04 ENCOUNTER — Inpatient Hospital Stay (HOSPITAL_COMMUNITY)
Admission: EM | Admit: 2024-05-04 | Discharge: 2024-05-07 | DRG: 481 | Disposition: A | Discharge status: Skilled Nursing Facility | Attending: Internal Medicine | Admitting: Internal Medicine

## 2024-05-04 ENCOUNTER — Telehealth: Payer: Self-pay

## 2024-05-04 ENCOUNTER — Encounter (HOSPITAL_COMMUNITY): Payer: Self-pay

## 2024-05-04 DIAGNOSIS — I10 Essential (primary) hypertension: Secondary | ICD-10-CM | POA: Diagnosis present

## 2024-05-04 DIAGNOSIS — K219 Gastro-esophageal reflux disease without esophagitis: Secondary | ICD-10-CM | POA: Diagnosis present

## 2024-05-04 DIAGNOSIS — Z8673 Personal history of transient ischemic attack (TIA), and cerebral infarction without residual deficits: Secondary | ICD-10-CM | POA: Diagnosis not present

## 2024-05-04 DIAGNOSIS — E861 Hypovolemia: Secondary | ICD-10-CM | POA: Diagnosis present

## 2024-05-04 DIAGNOSIS — M4854XA Collapsed vertebra, not elsewhere classified, thoracic region, initial encounter for fracture: Secondary | ICD-10-CM | POA: Diagnosis present

## 2024-05-04 DIAGNOSIS — E782 Mixed hyperlipidemia: Secondary | ICD-10-CM | POA: Diagnosis present

## 2024-05-04 DIAGNOSIS — W08XXXA Fall from other furniture, initial encounter: Secondary | ICD-10-CM | POA: Diagnosis present

## 2024-05-04 DIAGNOSIS — S72002A Fracture of unspecified part of neck of left femur, initial encounter for closed fracture: Secondary | ICD-10-CM | POA: Diagnosis present

## 2024-05-04 DIAGNOSIS — J449 Chronic obstructive pulmonary disease, unspecified: Secondary | ICD-10-CM | POA: Diagnosis not present

## 2024-05-04 DIAGNOSIS — W19XXXA Unspecified fall, initial encounter: Principal | ICD-10-CM

## 2024-05-04 DIAGNOSIS — I442 Atrioventricular block, complete: Secondary | ICD-10-CM | POA: Diagnosis present

## 2024-05-04 DIAGNOSIS — Z888 Allergy status to other drugs, medicaments and biological substances status: Secondary | ICD-10-CM | POA: Diagnosis not present

## 2024-05-04 DIAGNOSIS — J432 Centrilobular emphysema: Secondary | ICD-10-CM | POA: Diagnosis present

## 2024-05-04 DIAGNOSIS — Z8551 Personal history of malignant neoplasm of bladder: Secondary | ICD-10-CM | POA: Diagnosis not present

## 2024-05-04 DIAGNOSIS — Z95818 Presence of other cardiac implants and grafts: Secondary | ICD-10-CM | POA: Diagnosis not present

## 2024-05-04 DIAGNOSIS — Z8249 Family history of ischemic heart disease and other diseases of the circulatory system: Secondary | ICD-10-CM | POA: Diagnosis not present

## 2024-05-04 DIAGNOSIS — Z87891 Personal history of nicotine dependence: Secondary | ICD-10-CM

## 2024-05-04 DIAGNOSIS — Z950 Presence of cardiac pacemaker: Secondary | ICD-10-CM

## 2024-05-04 DIAGNOSIS — E871 Hypo-osmolality and hyponatremia: Secondary | ICD-10-CM | POA: Diagnosis present

## 2024-05-04 DIAGNOSIS — N400 Enlarged prostate without lower urinary tract symptoms: Secondary | ICD-10-CM | POA: Diagnosis present

## 2024-05-04 DIAGNOSIS — Z79899 Other long term (current) drug therapy: Secondary | ICD-10-CM | POA: Diagnosis not present

## 2024-05-04 DIAGNOSIS — F0280 Dementia in other diseases classified elsewhere without behavioral disturbance: Secondary | ICD-10-CM | POA: Diagnosis present

## 2024-05-04 DIAGNOSIS — G309 Alzheimer's disease, unspecified: Secondary | ICD-10-CM | POA: Diagnosis present

## 2024-05-04 DIAGNOSIS — E063 Autoimmune thyroiditis: Secondary | ICD-10-CM | POA: Diagnosis present

## 2024-05-04 DIAGNOSIS — Z823 Family history of stroke: Secondary | ICD-10-CM

## 2024-05-04 DIAGNOSIS — H3530 Unspecified macular degeneration: Secondary | ICD-10-CM | POA: Diagnosis present

## 2024-05-04 DIAGNOSIS — I2510 Atherosclerotic heart disease of native coronary artery without angina pectoris: Secondary | ICD-10-CM | POA: Diagnosis present

## 2024-05-04 DIAGNOSIS — Z7982 Long term (current) use of aspirin: Secondary | ICD-10-CM | POA: Diagnosis not present

## 2024-05-04 DIAGNOSIS — S7222XA Displaced subtrochanteric fracture of left femur, initial encounter for closed fracture: Principal | ICD-10-CM | POA: Diagnosis present

## 2024-05-04 DIAGNOSIS — Z981 Arthrodesis status: Secondary | ICD-10-CM

## 2024-05-04 DIAGNOSIS — Y92009 Unspecified place in unspecified non-institutional (private) residence as the place of occurrence of the external cause: Secondary | ICD-10-CM | POA: Diagnosis not present

## 2024-05-04 DIAGNOSIS — Z833 Family history of diabetes mellitus: Secondary | ICD-10-CM

## 2024-05-04 LAB — CBC WITH DIFFERENTIAL/PLATELET
Abs Immature Granulocytes: 0.02 K/uL (ref 0.00–0.07)
Basophils Absolute: 0 K/uL (ref 0.0–0.1)
Basophils Relative: 1 %
Eosinophils Absolute: 0.3 K/uL (ref 0.0–0.5)
Eosinophils Relative: 5 %
HCT: 38.6 % — ABNORMAL LOW (ref 39.0–52.0)
Hemoglobin: 12.4 g/dL — ABNORMAL LOW (ref 13.0–17.0)
Immature Granulocytes: 0 %
Lymphocytes Relative: 13 %
Lymphs Abs: 0.8 K/uL (ref 0.7–4.0)
MCH: 29.2 pg (ref 26.0–34.0)
MCHC: 32.1 g/dL (ref 30.0–36.0)
MCV: 90.8 fL (ref 80.0–100.0)
Monocytes Absolute: 0.5 K/uL (ref 0.1–1.0)
Monocytes Relative: 9 %
Neutro Abs: 4.2 K/uL (ref 1.7–7.7)
Neutrophils Relative %: 72 %
Platelets: 196 K/uL (ref 150–400)
RBC: 4.25 MIL/uL (ref 4.22–5.81)
RDW: 16.4 % — ABNORMAL HIGH (ref 11.5–15.5)
WBC: 5.8 K/uL (ref 4.0–10.5)
nRBC: 0 % (ref 0.0–0.2)

## 2024-05-04 LAB — BASIC METABOLIC PANEL WITH GFR
Anion gap: 9 (ref 5–15)
BUN: 18 mg/dL (ref 8–23)
CO2: 24 mmol/L (ref 22–32)
Calcium: 9.6 mg/dL (ref 8.9–10.3)
Chloride: 103 mmol/L (ref 98–111)
Creatinine, Ser: 0.82 mg/dL (ref 0.61–1.24)
GFR, Estimated: >60 mL/min (ref >60)
Glucose, Bld: 106 mg/dL — ABNORMAL HIGH (ref 70–99)
Potassium: 4 mmol/L (ref 3.5–5.1)
Sodium: 136 mmol/L (ref 135–145)

## 2024-05-04 MED ORDER — OXYCODONE-ACETAMINOPHEN 5-325 MG PO TABS
1.0000 | ORAL_TABLET | Freq: Once | ORAL | Status: AC
  Administered 2024-05-04 (×2): 1 via ORAL
  Filled 2024-05-04: qty 1

## 2024-05-04 MED ORDER — METHOCARBAMOL 500 MG PO TABS
500.0000 mg | ORAL_TABLET | Freq: Four times a day (QID) | ORAL | Status: DC | PRN
  Administered 2024-05-06 – 2024-05-07 (×4): 500 mg via ORAL
  Filled 2024-05-04 (×4): qty 1

## 2024-05-04 MED ORDER — ONDANSETRON HCL 4 MG/2ML IJ SOLN: 4.0000 mg | Freq: Four times a day (QID) | INTRAMUSCULAR | Status: DC | PRN

## 2024-05-04 MED ORDER — ENOXAPARIN SODIUM 40 MG/0.4ML IJ SOSY: 40.0000 mg | PREFILLED_SYRINGE | INTRAMUSCULAR | Status: DC

## 2024-05-04 MED ORDER — SODIUM CHLORIDE 0.9 % IV SOLN: INTRAVENOUS | Status: AC

## 2024-05-04 MED ORDER — METHOCARBAMOL 1000 MG/10ML IJ SOLN
500.0000 mg | Freq: Four times a day (QID) | INTRAMUSCULAR | Status: DC | PRN
  Administered 2024-05-05 (×2): 500 mg via INTRAVENOUS
  Filled 2024-05-04: qty 10

## 2024-05-04 MED ORDER — POLYETHYLENE GLYCOL 3350 17 G PO PACK: 17.0000 g | PACK | Freq: Every day | ORAL | Status: DC | PRN

## 2024-05-04 MED ORDER — MORPHINE SULFATE (PF) 2 MG/ML IV SOLN
0.5000 mg | INTRAVENOUS | Status: DC | PRN
  Administered 2024-05-05 (×4): 0.5 mg via INTRAVENOUS
  Filled 2024-05-04 (×2): qty 1

## 2024-05-04 MED ORDER — DOCUSATE SODIUM 100 MG PO CAPS
100.0000 mg | ORAL_CAPSULE | Freq: Two times a day (BID) | ORAL | Status: DC
  Administered 2024-05-06 – 2024-05-07 (×3): 100 mg via ORAL
  Filled 2024-05-04 (×4): qty 1

## 2024-05-04 MED ORDER — HYDROCODONE-ACETAMINOPHEN 5-325 MG PO TABS
1.0000 | ORAL_TABLET | Freq: Four times a day (QID) | ORAL | Status: DC | PRN
  Administered 2024-05-04 (×2): 1 via ORAL
  Administered 2024-05-05 – 2024-05-06 (×3): 2 via ORAL
  Filled 2024-05-04: qty 1
  Filled 2024-05-04 (×2): qty 2

## 2024-05-04 NOTE — ED Notes (Signed)
 Pt refused xray,  EDP notified

## 2024-05-04 NOTE — ED Provider Notes (Addendum)
 [place] EMERGENCY DEPARTMENT AT [facility] Provider Note   CSN: [id number] Arrival date & time: 05/04/24  0825     Patient presents with: Hip Pain   Oscar [name] is a 89 y.o. male.   HPI Patient presents after fall.  Medical history includes HTN, bladder cancer, CAD, HLD, CVA, GERD, BPH, CHF.  5 weeks ago, he had a ground-level fall and sustained a left hip fracture.  On 7/6, he underwent screw fixation of left femoral neck with Dr. Edna.  He has been ambulating with a walker since that time.  He resides in an assisted living facility with his wife.  This morning, he was on the couch and fell off after reaching for his phone.  He landed on his left side and has since had new left-sided hip pain.  He arrives via EMS.  Vital signs were normal prior to arrival.  Patient endorses ongoing pain in left hip, described as pain in left buttocks radiating to left medial proximal thigh.  He denies any other areas of pain or suspected injury.  He denies striking his head or loss of consciousness.    Prior to Admission medications   Medication Sig Start Date End Date Taking? Authorizing Provider  acetaminophen  (TYLENOL ) 500 MG tablet Take 2 tablets (1,000 mg total) by mouth every 8 (eight) hours. Patient taking differently: Take 1,000 mg by mouth every 8 (eight) hours as needed for mild pain (pain score 1-3) or moderate pain (pain score 4-6). 12/17/23   Shahmehdi, Adriana LABOR, MD  atorvastatin  (LIPITOR) 40 MG tablet Take 1 tablet (40 mg total) by mouth daily. 11/27/20   Revankar, Jennifer SAUNDERS, MD  Cholecalciferol  (VITAMIN D -3) 125 MCG (5000 UT) TABS Take 5,000 Units by mouth daily.    [provider]  ferrous sulfate  325 (65 FE) MG EC tablet Take 1 tablet (325 mg total) by mouth daily with breakfast. 01/20/24   Wendling, Mabel Mt, DO  finasteride  (PROSCAR ) 5 MG tablet Take 1 tablet (5 mg total) by mouth daily. 03/23/24   Frann Mabel Mt, DO  midodrine  (PROAMATINE ) 5 MG  tablet Take 1 tablet (5 mg total) by mouth 2 (two) times daily with a meal. 04/02/24   Raenelle Coria, MD  Multiple Vitamin (MULTIVITAMIN WITH MINERALS) TABS tablet Take 1 tablet by mouth daily. 12/17/23   Shahmehdi, Adriana LABOR, MD  pantoprazole  (PROTONIX ) 40 MG tablet Take 1 tablet (40 mg total) by mouth daily. 11/19/23   Frann Mabel Mt, DO    Allergies: Patient has no known allergies.    Review of Systems  Musculoskeletal:  Positive for arthralgias.  All other systems reviewed and are negative.   Updated Vital Signs BP (!) 161/94 (BP Location: Left Arm)   Pulse 72   Temp 98 F (36.7 C) (Oral)   Resp 15   SpO2 100%   Physical Exam Vitals and nursing note reviewed.  Constitutional:      General: He is not in acute distress.    Appearance: Normal appearance. He is well-developed. He is not ill-appearing, toxic-appearing or diaphoretic.  HENT:     Head: Normocephalic and atraumatic.     Right Ear: External ear normal.     Left Ear: External ear normal.     Nose: Nose normal.     Mouth/Throat:     Mouth: Mucous membranes are moist.  Eyes:     Extraocular Movements: Extraocular movements intact.     Conjunctiva/sclera: Conjunctivae normal.  Cardiovascular:  Rate and Rhythm: Normal rate and regular rhythm.  Pulmonary:     Effort: Pulmonary effort is normal. No respiratory distress.  Chest:     Chest wall: No tenderness.  Abdominal:     General: There is no distension.     Palpations: Abdomen is soft.     Tenderness: There is no abdominal tenderness.  Musculoskeletal:        General: Tenderness and signs of injury present. No swelling or deformity.     Cervical back: Normal range of motion and neck supple.     Right lower leg: No edema.     Left lower leg: No edema.  Skin:    General: Skin is warm and dry.     Coloration: Skin is not jaundiced or pale.  Neurological:     General: No focal deficit present.     Mental Status: He is alert and oriented to person,  place, and time.     Cranial Nerves: No cranial nerve deficit.     Sensory: No sensory deficit.     Motor: No weakness.     Coordination: Coordination normal.  Psychiatric:        Mood and Affect: Mood normal.        Behavior: Behavior normal.     (all labs ordered are listed, but only abnormal results are displayed) Labs Reviewed  BASIC METABOLIC PANEL WITH GFR - Abnormal; Notable for the following components:      Result Value   Glucose, Bld 106 (*)    All other components within normal limits  CBC WITH DIFFERENTIAL/PLATELET - Abnormal; Notable for the following components:   Hemoglobin 12.4 (*)    HCT 38.6 (*)    RDW 16.4 (*)    All other components within normal limits    EKG: EKG Interpretation Date/Time:  Tuesday May 04 2024 09:40:32 EDT Ventricular Rate:  70 PR Interval:  184 QRS Duration:  161 QT Interval:  464 QTC Calculation: 501 R Axis:   -87  Text Interpretation: Sinus rhythm Ventricular premature complex IVCD, consider atypical RBBB Left ventricular hypertrophy Confirmed by Melvenia Motto [id number]) on 05/04/2024 10:53:27 AM  Radiology: CT PELVIS WO CONTRAST Result Date: 05/04/2024 CLINICAL DATA:  Left hip pain after falling today. Concern for fracture. EXAM: CT PELVIS WITHOUT CONTRAST TECHNIQUE: Multidetector CT imaging of the pelvis was performed following the standard protocol without intravenous contrast. RADIATION DOSE REDUCTION: This exam was performed according to the departmental dose-optimization program which includes automated exposure control, adjustment of the mA and/or kV according to patient size and/or use of iterative reconstruction technique. COMPARISON:  None recent. Left femur radiographs 04/01/2024. CT pelvis 09/19/2021. Left hip CT 03/27/2024. FINDINGS: Urinary Tract: The visualized distal ureters and bladder appear unremarkable. Bowel: No bowel wall thickening, distention or surrounding inflammation identified within the pelvis. Mild diverticular  changes within the distal colon. Vascular/Lymphatic: No enlarged pelvic lymph nodes identified. Aortoiliac atherosclerosis without evidence of aneurysm. Reproductive: Mild enlargement of the prostate gland. Other: No pelvic ascites or pneumoperitoneum. Musculoskeletal: Since the prior pelvic CT, the patient has undergone right femoral compression screw plate fixation (12/12/2023) and left femoral neck pinning (03/28/2024). There is a new mildly displaced intertrochanteric left femur fracture. Deformity of the left femoral neck from previous subcapital fracture is grossly unchanged. The right femoral hardware is incompletely visualized. There is possible loosening of the intramedullary nail. There is incomplete healing of the right intertrochanteric femur fracture with sclerotic margins anteriorly. No evidence of dislocation or acute pelvic  fracture. There is a new severe biconcave compression fracture at L5 with associated osseous retropulsion. This results in approximately 75% loss of vertebral body height. This fracture is probably present on the 03/28/2024 radiographs. IMPRESSION: 1. New mildly displaced intertrochanteric left femur fracture. 2. Incomplete healing of the right intertrochanteric femur fracture with possible loosening of the intramedullary nail. 3. New severe biconcave compression fracture at L5 with associated osseous retropulsion since previous pelvic CT in 2022. This fracture is probably present on the 03/28/2024 radiographs. Correlate clinically. 4. No evidence of dislocation or acute pelvic fracture. 5.  Aortic Atherosclerosis (ICD10-I70.0). Electronically Signed   By: Elsie Perone M.D.   On: 05/04/2024 12:26   DG Chest 1 View Result Date: 05/04/2024 CLINICAL DATA:  Fall from couch this morning.  Left hip pain. EXAM: CHEST  1 VIEW COMPARISON:  Radiographs 03/27/2024 and 12/11/2023.  CT 12/15/2020. FINDINGS: 1124 hours. Left subclavian pacemaker leads appear unchanged, projecting over the  right atrium and right ventricle. The heart size and mediastinal contours are stable with aortic atherosclerosis. Mild atelectasis at both lung bases. No edema, confluent airspace disease, pleural effusion or pneumothorax. No acute fractures are identified within the chest. IMPRESSION: Mild bibasilar atelectasis. No evidence of acute chest injury. Electronically Signed   By: Elsie Perone M.D.   On: 05/04/2024 12:09   CT CERVICAL SPINE WO CONTRAST Result Date: 05/04/2024 CLINICAL DATA:  89 year old male status post fall from couch. Pain. EXAM: CT CERVICAL SPINE WITHOUT CONTRAST TECHNIQUE: Multidetector CT imaging of the cervical spine was performed without intravenous contrast. Multiplanar CT image reconstructions were also generated. RADIATION DOSE REDUCTION: This exam was performed according to the departmental dose-optimization program which includes automated exposure control, adjustment of the mA and/or kV according to patient size and/or use of iterative reconstruction technique. COMPARISON:  Head CT today.  Cervical spine CT 03/27/2024. FINDINGS: Alignment: Stable. Maintained cervicothoracic junction alignment and bilateral posterior element alignment. Skull base and vertebrae: Stable bone mineralization. Visualized skull base is intact. No atlanto-occipital dissociation. Occipital-C1 and anterior C1-C2 joint degeneration. Those levels appear intact and aligned. Postoperative changes detailed below. No acute osseous abnormality identified. Soft tissues and spinal canal: No prevertebral fluid or swelling. No visible canal hematoma. Negative for age visible noncontrast neck soft tissues. Disc levels: C4-C5 ACDF hardware with solid ankylosis or arthrodesis C3 through C6. Stable adjacent segment disease at C6-C7 with vacuum disc. Stable CT appearance of cervical spine degeneration since last month. Upper chest: Visible upper thoracic levels appear stable including subtle T3 and partially visible T4  superior endplate compression. Stable upper lobe lung scarring. Left chest cardiac pacemaker device redemonstrated. Calcified aortic atherosclerosis. Fluid containing but nondilated proximal thoracic esophagus. IMPRESSION: 1. No acute traumatic injury identified in the cervical spine. 2. 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 Stable advanced adjacent segment disease at C6-C7. 3. Mild chronic T3 and T4 upper thoracic compression fractures suspected. Aortic Atherosclerosis (ICD10-I70.0). Electronically Signed   By: VEAR Hurst M.D.   On: 05/04/2024 09:46   CT HEAD WO CONTRAST Result Date: 05/04/2024 CLINICAL DATA:  89 year old male status post fall from couch. Pain. EXAM: CT HEAD WITHOUT CONTRAST TECHNIQUE: Contiguous axial images were obtained from the base of the skull through the vertex without intravenous contrast. RADIATION DOSE REDUCTION: This exam was performed according to the departmental dose-optimization program which includes automated exposure control, adjustment of the mA and/or kV according to patient size and/or use of iterative reconstruction technique. COMPARISON:  Brain MRI 09/14/2020.  Head CT 03/31/2024. FINDINGS: Brain: Cerebral volume does not appear significantly changed since the 2021 MRI. No midline shift, ventriculomegaly, mass effect, evidence of mass lesion, intracranial hemorrhage or evidence of cortically based acute infarction. Patchy mild to moderate chronic periventricular white matter hypodensity with some deep white matter capsule involvement on the left. Stable gray-white matter differentiation throughout the brain. Vascular: Calcified atherosclerosis at the skull base. No suspicious intracranial vascular hyperdensity. Skull: Stable and intact. Sinuses/Orbits: Visualized paranasal sinuses and mastoids are clear. Other: No acute orbit or scalp soft tissue injury identified. IMPRESSION: 1. No acute intracranial abnormality or acute traumatic injury  identified. 2. Chronic white matter disease stable since a 2021 MRI. Electronically Signed   By: VEAR Hurst M.D.   On: 05/04/2024 09:42     Procedures   Medications Ordered in the ED  oxyCODONE -acetaminophen  (PERCOCET/ROXICET) 5-325 MG per tablet 1 tablet (1 tablet Oral Given 05/04/24 0906)  oxyCODONE -acetaminophen  (PERCOCET/ROXICET) 5-325 MG per tablet 1 tablet (1 tablet Oral Given 05/04/24 1050)                                    Medical Decision Making Amount and/or Complexity of Data Reviewed Labs: ordered. Radiology: ordered.  Risk Prescription drug management. Decision regarding hospitalization.   This patient presents to the ED for concern of fall, this involves an extensive number of treatment options, and is a complaint that carries with it a high risk of complications and morbidity.  The differential diagnosis includes acute injuries   Co morbidities / Chronic conditions that complicate the patient evaluation  HTN, bladder cancer, CAD, HLD, CVA, GERD, BPH, CHF   Additional history obtained:  Additional history obtained from EMR External records from outside source obtained and reviewed including EMS   Lab Tests:  I Ordered, and personally interpreted labs.  The pertinent results include: Normal hemoglobin, no leukocytosis, normal kidney function, normal electrolytes   Imaging Studies ordered:  I ordered imaging studies including CT of head, cervical spine, pelvis; chest x-ray I independently visualized and interpreted imaging which showed new mildly displaced intertrochanteric fracture of left femur I agree with the radiologist interpretation   Cardiac Monitoring: / EKG:  The patient was maintained on a cardiac monitor.  I personally viewed and interpreted the cardiac monitored which showed an underlying rhythm of: Sinus rhythm   Problem List / ED Course / Critical interventions / Medication management  Patient presenting after fall off couch.  He did  land on his left side and has new pain to his recently surgically repaired left hip.  He has no more weight since this fall.  On arrival in the ED, he is alert and oriented.  He arrives in a cervical collar.  He denies any areas of pain other than his left hip.  Distal extremity is neurovascularly intact.  Percocet was ordered for analgesia.  Workup was initiated.  Imaging studies did show a new mildly displaced intertrochanteric left femur fracture.  I discussed this with orthopedic surgeon on-call, Dr. Josefina, who request admission to medicine.  Ortho will see in consult.  Patient was admitted for further management. I ordered medication including Percocet for analgesia Reevaluation of the patient after these medicines showed that the patient improved I have reviewed the patients home medicines and have made adjustments as needed   Consultations Obtained:  I requested consultation with the surgeon, Dr. Josefina,  and discussed lab and imaging findings  as well as pertinent plan - they recommend: Admission to medicine, Ortho will see in consult   Social Determinants of Health:  Lives with wife in assisted living facility     Final diagnoses:  Fall, initial encounter  Closed fracture of left hip, initial encounter [facility])    ED Discharge Orders     None          Melvenia Motto, MD 05/04/24 1343    Melvenia Motto, MD 05/04/24 1401

## 2024-05-04 NOTE — Plan of Care (Signed)

## 2024-05-04 NOTE — ED Triage Notes (Addendum)
 Pt arrived via EMS, from assisted living. Mechanical fall off of couch this morning while reaching for cell phone. C/o left hip pain. No LOC, no thinners. C-collar in place. L Hip surgery approx 5 wks ago.

## 2024-05-04 NOTE — H&P (Signed)
 [facility] History and Physical  Oscar [name] FMW:[id number] DOB: 1931-08-14 DOA: 05/04/2024  Referring physician: ED  PCP: Frann Mabel Mt, DO   Patient is coming from: Assisted living facility  Chief Complaint: Left hip pain  HPI:  Patient is a 89 years old male with past medical history of COPD, CVA, coronary artery disease, hypertension, hyperlipidemia with recent hip surgery approximately 5 weeks back for hip fracture treated with screws presented to the hospital from assisted living facility after sustaining a mechanical fall.  He tried to reach out to his cell phone when he felt.  He denied any loss of consciousness chest pain prior to the fall.  He however complains of some dizziness.  He had been doing physical therapy at the assisted living facility.  Patient denies any nausea vomiting diarrhea or abdominal pain.  Denied any chest pain, shortness of breath or dyspnea.  Denies any urinary urgency frequency or dysuria.  Denies being on any blood thinners.  Patient was then called board in the hospital.    In the ED, patient had slightly elevated blood pressure.  Labs were notable for 12.4.  BMP was within normal limits.  X-ray of the chest was negative for infiltrate.  CT head scan was negative for acute findings.  CT cervical spine with chronic findings including chronic T3-T4 compression fracture suspected.  CT of the hip and pelvis showed new mildly displaced intertrochanteric left femur fracture and incomplete healing  with possible loosening of the intramedullary nail.  Orthopedics Dr. Josefina was notified from the ED and patient was considered for admission to the hospital for further evaluation and treatment.   Assessment and Plan Principal Problem:   Closed left hip fracture (HCC)  Left femur mildly displaced intertrochanteric fracture. Recent history of hip fracture with screw plate fixation.   Orthopedics has been notified.  Keep n.p.o after midnight.SABRA  analgesics antiemetics immobilization.  Plan is for surgical intervention.  Hip fracture template utilized.  Hyperlipidemia. Continue Statins.   Hypertension.  Will closely monitor blood pressure.  On midodrine  at this time.  History of CAD.  No active issues.  Continue statins  DVT Prophylaxis: Consider Lovenox  subcu from tomorrow  Review of Systems:  All systems were reviewed and were negative unless otherwise mentioned in the HPI   Past Medical History:  Diagnosis Date   Abnormal nuclear stress test    Abnormal results of thyroid  function studies 01/19/2014   Formatting of this note might be different from the original. August 2016  TSH 4.01   Apneic episode 11/29/2020   Ascending aorta dilatation (HCC) 12/07/2020   Atrial tachycardia, paroxysmal (HCC) 08/05/2019   Bunion of great toe of right foot 09/18/2016   Centrilobular emphysema (HCC) 11/26/2020   Cervical stenosis of spine    CHB (complete heart block) (HCC) 09/28/2020   Coronary artery disease involving native coronary artery of native heart without angina pectoris 08/05/2019   CVA (cerebral vascular accident) (HCC) 03/12/2020   Encounter for general adult medical examination without abnormal findings 05/20/2016   Essential hypertension 03/12/2020   Exertional dyspnea 01/18/2020   Gastro-esophageal reflux disease with esophagitis 09/28/2010   GERD (gastroesophageal reflux disease)    History of bladder cancer 03/14/2014   History of concussion    X2   NO RESIDUALS   History of peptic ulcer 1970'S   Hypercholesterolemia    Hypothyroidism due to Hashimoto's thyroiditis 07/23/2019   LBBB (left bundle branch block)    Macular degeneration of left eye  Mixed hyperlipidemia 11/24/2013   Formatting of this note might be different from the original. History of TIA.  The goal is to have your total cholesterol < 200, the HDL (good cholesterol) >40, and the LDL (bad cholesterol) <100. It is recomended that you follow a  good low fat diet and exercise for 30 minutes 3-4 times a week.   Personal history of tobacco use, presenting hazards to health 01/12/2014   Presence of permanent cardiac pacemaker 11/15/2020   S/P bilateral cataract extraction 09/18/2016   Serum reaction due to vaccination 09/27/2011   Formatting of this note might be different from the original. IMPRESSION: seen on Wednesday for reaction to right arm after pneumovax, had redness, warmth , tenderness and edema of right upper arm which is now resolved. monitor area. use warm compresses prn. call prn. discussed will not need another pneumovax, he will consider zostavax in future.   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   Status post placement of implantable loop recorder 11/08/2014   TIA (transient ischemic attack) 04/09/2014   Past Surgical History:  Procedure Laterality Date   ANTERIOR CERVICAL DECOMP/DISCECTOMY FUSION N/A 01/27/2018   Procedure: Anterior Cervical Discectomy Fusion - Cervical Four- Cervical Five;  Surgeon: Louis Shove, MD;  Location: [facility] OR;  Service: Neurosurgery;  Laterality: N/A;  Anterior Cervical Discectomy Fusion - Cervical Four- Cervical Five   APPENDECTOMY  1970   CARDIOVASCULAR STRESS TEST  03/22/09   CYSTOSCOPY WITH BIOPSY N/A 02/10/2013   Procedure: CYSTOSCOPY WITH COLD CUP BIOPSY/FULGERATION;  Surgeon: Alm GORMAN Fragmin, MD;  Location: [facility];  Service: Urology;  Laterality: N/A;  ALSO FULGERATION    HIP PINNING,CANNULATED Left 03/28/2024   Procedure: FIXATION, FEMUR, NECK, PERCUTANEOUS, USING SCREW;  Surgeon: Edna Toribio LABOR, MD;  Location: [facility] OR;  Service: Orthopedics;  Laterality: Left;   INGUINAL HERNIA REPAIR Right 07-14-2002   INTRAMEDULLARY (IM) NAIL INTERTROCHANTERIC Right 12/12/2023   Procedure: FIXATION, FRACTURE, INTERTROCHANTERIC, WITH INTRAMEDULLARY ROD;  Surgeon: Kendal Franky SQUIBB, MD;  Location: [facility] OR;  Service: Orthopedics;  Laterality: Right;   LEFT HEART CATH  AND CORONARY ANGIOGRAPHY N/A 07/13/2019   Procedure: LEFT HEART CATH AND CORONARY ANGIOGRAPHY;  Surgeon: Burnard Debby LABOR, MD;  Location: [facility] INVASIVE CV LAB;  Service: Cardiovascular;  Laterality: N/A;   LOOP RECORDER IMPLANT N/A 05/24/2014   Procedure: LOOP RECORDER IMPLANT;  Surgeon: Jerel Balding, MD;  Location: [facility] CATH LAB;  Service: Cardiovascular;  Laterality: N/A;   LOOP RECORDER REMOVAL N/A 10/02/2020   Procedure: LOOP RECORDER REMOVAL;  Surgeon: Balding Jerel, MD;  Location: [facility];  Service: Cardiovascular;  Laterality: N/A;   NECK SURGERY     PACEMAKER IMPLANT N/A 10/02/2020   Procedure: PACEMAKER IMPLANT;  Surgeon: Balding Jerel, MD;  Location: [facility] CV [facility];  Service: Cardiovascular;  Laterality: N/A;   TONSILLECTOMY     TRANSURETHRAL RESECTION OF BLADDER TUMOR  03-10-2008   TRANSURETHRAL RESECTION OF BLADDER TUMOR Bilateral 12/28/2020   Procedure: TRANSURETHRAL RESECTION OF BLADDER TUMOR BLADDER BIOPSY FULGARATION BILATERAL RETROGRADE PYELOGRAM(TURBT);  Surgeon: Cam Morene ORN, MD;  Location: [facility] ORS;  Service: Urology;  Laterality: Bilateral;   TUMOR EXCISION Left    Left arm    Social History:  reports that he quit smoking about 45 years ago. His smoking use included cigarettes. He started smoking about 75 years ago. He has a 15 pack-year smoking history. He has never used smokeless tobacco. He reports current alcohol  use. He reports that he does not use drugs.  No Known Allergies  Family History  Problem Relation Age of Onset   Heart failure Mother    Stroke Father    Diabetes Brother    Heart failure Brother      Prior to Admission medications   Medication Sig Start Date End Date Taking? Authorizing Provider  acetaminophen  (TYLENOL ) 500 MG tablet Take 2 tablets (1,000 mg total) by mouth every 8 (eight) hours. Patient taking differently: Take 1,000 mg by mouth every 8 (eight) hours as needed for mild pain (pain score 1-3) or moderate pain (pain  score 4-6). 12/17/23   Shahmehdi, Adriana LABOR, MD  atorvastatin  (LIPITOR) 40 MG tablet Take 1 tablet (40 mg total) by mouth daily. 11/27/20   Revankar, Jennifer SAUNDERS, MD  Cholecalciferol  (VITAMIN D -3) 125 MCG (5000 UT) TABS Take 5,000 Units by mouth daily.    [provider]  ferrous sulfate  325 (65 FE) MG EC tablet Take 1 tablet (325 mg total) by mouth daily with breakfast. 01/20/24   Wendling, Mabel Mt, DO  finasteride  (PROSCAR ) 5 MG tablet Take 1 tablet (5 mg total) by mouth daily. 03/23/24   Frann Mabel Mt, DO  midodrine  (PROAMATINE ) 5 MG tablet Take 1 tablet (5 mg total) by mouth 2 (two) times daily with a meal. 04/02/24   Raenelle Coria, MD  Multiple Vitamin (MULTIVITAMIN WITH MINERALS) TABS tablet Take 1 tablet by mouth daily. 12/17/23   Shahmehdi, Adriana LABOR, MD  pantoprazole  (PROTONIX ) 40 MG tablet Take 1 tablet (40 mg total) by mouth daily. 11/19/23   Frann Mabel Mt, DO    Physical Exam:  Vitals:   05/04/24 0836 05/04/24 1232 05/04/24 1451  BP: (!) 165/85 (!) 161/94 (!) 149/82  Pulse: 70 72 78  Resp: 20 15 20   Temp: 97.7 F (36.5 C) 98 F (36.7 C) 98.7 F (37.1 C)  TempSrc: Oral Oral Oral  SpO2: 100% 100% 99%   Wt Readings from Last 3 Encounters:  03/23/24 64.8 kg  01/19/24 63.5 kg  12/12/23 63.5 kg   There is no height or weight on file to calculate BMI.  General:  Average built, not in obvious distress, elderly male, Communicative HENT: Normocephalic, No scleral pallor or icterus noted. Oral mucosa is moist.  Chest:  Clear breath sounds.  . No crackles or wheezes.  CVS: S1 &S2 heard. No murmur.  Regular rate and rhythm. Abdomen: Soft, nontender, nondistended.  Bowel sounds are heard. No abdominal mass palpated Extremities: No cyanosis, clubbing or edema.  Peripheral pulses are palpable. Psych: Alert, awake and oriented, normal mood CNS:  No cranial nerve deficits.  Left hip with tenderness with mild external rotation. Skin: Warm and dry.  No rashes  noted.  Labs on Admission:   CBC: Recent Labs  Lab 05/04/24 0837  WBC 5.8  NEUTROABS 4.2  HGB 12.4*  HCT 38.6*  MCV 90.8  PLT 196    Basic Metabolic Panel: Recent Labs  Lab 05/04/24 0837  NA 136  K 4.0  CL 103  CO2 24  GLUCOSE 106*  BUN 18  CREATININE 0.82  CALCIUM  9.6    Liver Function Tests: No results for input(s): AST, ALT, ALKPHOS, BILITOT, PROT, ALBUMIN  in the last 168 hours. No results for input(s): LIPASE, AMYLASE in the last 168 hours. No results for input(s): AMMONIA in the last 168 hours.  Cardiac Enzymes: No results for input(s): CKTOTAL, CKMB, CKMBINDEX, TROPONINI in the last 168 hours.  BNP (last 3 results) Recent Labs    03/31/24 2128  BNP 53.3  ProBNP (last 3 results) No results for input(s): PROBNP in the last 8760 hours.  CBG: No results for input(s): GLUCAP in the last 168 hours.  Lipase     Component Value Date/Time   LIPASE 38 09/19/2021 1705     Urinalysis    Component Value Date/Time   COLORURINE YELLOW 03/31/2024 2355   APPEARANCEUR HAZY (A) 03/31/2024 2355   LABSPEC 1.017 03/31/2024 2355   PHURINE 5.0 03/31/2024 2355   GLUCOSEU NEGATIVE 03/31/2024 2355   HGBUR NEGATIVE 03/31/2024 2355   BILIRUBINUR NEGATIVE 03/31/2024 2355   KETONESUR 20 (A) 03/31/2024 2355   PROTEINUR NEGATIVE 03/31/2024 2355   UROBILINOGEN 0.2 06/22/2007 0800   NITRITE NEGATIVE 03/31/2024 2355   LEUKOCYTESUR NEGATIVE 03/31/2024 2355     Drugs of Abuse  No results found for: LABOPIA, COCAINSCRNUR, LABBENZ, AMPHETMU, THCU, LABBARB    Radiological Exams on Admission: CT PELVIS WO CONTRAST Result Date: 05/04/2024 CLINICAL DATA:  Left hip pain after falling today. Concern for fracture. EXAM: CT PELVIS WITHOUT CONTRAST TECHNIQUE: Multidetector CT imaging of the pelvis was performed following the standard protocol without intravenous contrast. RADIATION DOSE REDUCTION: This exam was performed according to the  departmental dose-optimization program which includes automated exposure control, adjustment of the mA and/or kV according to patient size and/or use of iterative reconstruction technique. COMPARISON:  None recent. Left femur radiographs 04/01/2024. CT pelvis 09/19/2021. Left hip CT 03/27/2024. FINDINGS: Urinary Tract: The visualized distal ureters and bladder appear unremarkable. Bowel: No bowel wall thickening, distention or surrounding inflammation identified within the pelvis. Mild diverticular changes within the distal colon. Vascular/Lymphatic: No enlarged pelvic lymph nodes identified. Aortoiliac atherosclerosis without evidence of aneurysm. Reproductive: Mild enlargement of the prostate gland. Other: No pelvic ascites or pneumoperitoneum. Musculoskeletal: Since the prior pelvic CT, the patient has undergone right femoral compression screw plate fixation (12/12/2023) and left femoral neck pinning (03/28/2024). There is a new mildly displaced intertrochanteric left femur fracture. Deformity of the left femoral neck from previous subcapital fracture is grossly unchanged. The right femoral hardware is incompletely visualized. There is possible loosening of the intramedullary nail. There is incomplete healing of the right intertrochanteric femur fracture with sclerotic margins anteriorly. No evidence of dislocation or acute pelvic fracture. There is a new severe biconcave compression fracture at L5 with associated osseous retropulsion. This results in approximately 75% loss of vertebral body height. This fracture is probably present on the 03/28/2024 radiographs. IMPRESSION: 1. New mildly displaced intertrochanteric left femur fracture. 2. Incomplete healing of the right intertrochanteric femur fracture with possible loosening of the intramedullary nail. 3. New severe biconcave compression fracture at L5 with associated osseous retropulsion since previous pelvic CT in 2022. This fracture is probably present on  the 03/28/2024 radiographs. Correlate clinically. 4. No evidence of dislocation or acute pelvic fracture. 5.  Aortic Atherosclerosis (ICD10-I70.0). Electronically Signed   By: Elsie Perone M.D.   On: 05/04/2024 12:26   DG Chest 1 View Result Date: 05/04/2024 CLINICAL DATA:  Fall from couch this morning.  Left hip pain. EXAM: CHEST  1 VIEW COMPARISON:  Radiographs 03/27/2024 and 12/11/2023.  CT 12/15/2020. FINDINGS: 1124 hours. Left subclavian pacemaker leads appear unchanged, projecting over the right atrium and right ventricle. The heart size and mediastinal contours are stable with aortic atherosclerosis. Mild atelectasis at both lung bases. No edema, confluent airspace disease, pleural effusion or pneumothorax. No acute fractures are identified within the chest. IMPRESSION: Mild bibasilar atelectasis. No evidence of acute chest injury. Electronically Signed   By: Elsie  Gertrude M.D.   On: 05/04/2024 12:09   CT CERVICAL SPINE WO CONTRAST Result Date: 05/04/2024 CLINICAL DATA:  89 year old male status post fall from couch. Pain. EXAM: CT CERVICAL SPINE WITHOUT CONTRAST TECHNIQUE: Multidetector CT imaging of the cervical spine was performed without intravenous contrast. Multiplanar CT image reconstructions were also generated. RADIATION DOSE REDUCTION: This exam was performed according to the departmental dose-optimization program which includes automated exposure control, adjustment of the mA and/or kV according to patient size and/or use of iterative reconstruction technique. COMPARISON:  Head CT today.  Cervical spine CT 03/27/2024. FINDINGS: Alignment: Stable. Maintained cervicothoracic junction alignment and bilateral posterior element alignment. Skull base and vertebrae: Stable bone mineralization. Visualized skull base is intact. No atlanto-occipital dissociation. Occipital-C1 and anterior C1-C2 joint degeneration. Those levels appear intact and aligned. Postoperative changes detailed below. No  acute osseous abnormality identified. Soft tissues and spinal canal: No prevertebral fluid or swelling. No visible canal hematoma. Negative for age visible noncontrast neck soft tissues. Disc levels: C4-C5 ACDF hardware with solid ankylosis or arthrodesis C3 through C6. Stable adjacent segment disease at C6-C7 with vacuum disc. Stable CT appearance of cervical spine degeneration since last month. Upper chest: Visible upper thoracic levels appear stable including subtle T3 and partially visible T4 superior endplate compression. Stable upper lobe lung scarring. Left chest cardiac pacemaker device redemonstrated. Calcified aortic atherosclerosis. Fluid containing but nondilated proximal thoracic esophagus. IMPRESSION: 1. No acute traumatic injury identified in the cervical spine. 2. 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 Stable advanced adjacent segment disease at C6-C7. 3. Mild chronic T3 and T4 upper thoracic compression fractures suspected. Aortic Atherosclerosis (ICD10-I70.0). Electronically Signed   By: VEAR Hurst M.D.   On: 05/04/2024 09:46   CT HEAD WO CONTRAST Result Date: 05/04/2024 CLINICAL DATA:  89 year old male status post fall from couch. Pain. EXAM: CT HEAD WITHOUT CONTRAST TECHNIQUE: Contiguous axial images were obtained from the base of the skull through the vertex without intravenous contrast. RADIATION DOSE REDUCTION: This exam was performed according to the departmental dose-optimization program which includes automated exposure control, adjustment of the mA and/or kV according to patient size and/or use of iterative reconstruction technique. COMPARISON:  Brain MRI 09/14/2020.  Head CT 03/31/2024. FINDINGS: Brain: Cerebral volume does not appear significantly changed since the 2021 MRI. No midline shift, ventriculomegaly, mass effect, evidence of mass lesion, intracranial hemorrhage or evidence of cortically based acute infarction. Patchy mild to moderate chronic  periventricular white matter hypodensity with some deep white matter capsule involvement on the left. Stable gray-white matter differentiation throughout the brain. Vascular: Calcified atherosclerosis at the skull base. No suspicious intracranial vascular hyperdensity. Skull: Stable and intact. Sinuses/Orbits: Visualized paranasal sinuses and mastoids are clear. Other: No acute orbit or scalp soft tissue injury identified. IMPRESSION: 1. No acute intracranial abnormality or acute traumatic injury identified. 2. Chronic white matter disease stable since a 2021 MRI. Electronically Signed   By: VEAR Hurst M.D.   On: 05/04/2024 09:42    EKG: Personally reviewed by me which shows sinus rhythm   Consultant: Orthopedics  Code Status: Full code  Microbiology none  Antibiotics: None  Family Communication:  Patients' condition and plan of care including tests being ordered have been discussed with the patient and patient's wife and daughter who indicate understanding and agree with the plan.   Status is: Inpatient  Severity of Illness: The appropriate patient status for this patient is INPATIENT. Inpatient status is judged to be reasonable and necessary in  order to provide the required intensity of service to ensure the patient's safety. The patient's presenting symptoms, physical exam findings, and initial radiographic and laboratory data in the context of their chronic comorbidities is felt to place them at high risk for further clinical deterioration. Furthermore, it is not anticipated that the patient will be medically stable for discharge from the hospital within 2 midnights of admission.   * I certify that at the point of admission it is my clinical judgment that the patient will require inpatient hospital care spanning beyond 2 midnights from the point of admission due to high intensity of service, high risk for further deterioration and high frequency of surveillance required.*  Signed, Vernal Alstrom, MD [facility] 05/04/2024

## 2024-05-04 NOTE — Hospital Course (Signed)
 Patient is a 89 years old male with past medical history of COPD, CVA, coronary artery disease, hypertension, hyperlipidemia with recent hip surgery approximately 5 weeks back for hip fracture treated with screws presented to the hospital from assisted living facility after sustaining a mechanical fall.  He tried to reach out to his cell phone when he felt.  He denied any loss of consciousness dizziness lightheadedness or chest pain prior to the fall.  Denies being on any blood thinners.  Patient was then called board in the hospital.  In the ED patient had slightly elevated blood pressure.  Labs were notable for 12.4.  BMP was within normal limits.  X-ray of the chest was negative for infiltrate.  CT head scan was negative for acute findings.  CT cervical spine with chronic findings including chronic T3-T4 compression fracture suspected.  CT of the hip and pelvis showed new mildly displaced intertrochanteric left femur fracture and incomplete healing  with possible loosening of the intramedullary nail.  Orthopedics Dr. Josefina was notified from the ED and patient was considered for admission to the hospital for further evaluation and treatment.  Left femur mildly displaced intertrochanteric fracture. Recent history of hip fracture with screw plate fixation.  Orthopedics has been notified.  Keep n.p.o. IV fluids analgesics antiemetics immobilization.   Hyperlipidemia. Continue Statins.   Hypertension.  Will closely monitor blood pressure.  On midodrine  at this time.  History of CAD.  No active issues.  Continue statins

## 2024-05-04 NOTE — Consult Note (Signed)
 ORTHOPAEDIC CONSULTATION  REQUESTING PHYSICIAN: Pokhrel, Vernal, MD  Chief Complaint: left hip pain  HPI: Oscar [name] is a 89 y.o. male who complains of    Oscar [name] is a 89 y.o. male with history of HTN, bladder cancer, CAD, HLD, CVA, GERD, BPH, right intertrochanteric fracture s/p ORIF by Dr. Kendal in 11/2023. On 03/28/24 he underwent a left femoral neck cannluated screw fixation for left femoral neck fracture by Dr. Edna. He had been healing well from this and ambualting on a walker. Earlier today he was seated on the couch and fell off while reaching for the phone. He was brought to the [facility] ED with new left hip pain. Pain is severe with movement and better with rest and pain medication. He lives in assisted living withe his wife.   Past Medical History:  Diagnosis Date   Abnormal nuclear stress test    Abnormal results of thyroid  function studies 01/19/2014   Formatting of this note might be different from the original. August 2016  TSH 4.01   Apneic episode 11/29/2020   Ascending aorta dilatation (HCC) 12/07/2020   Atrial tachycardia, paroxysmal (HCC) 08/05/2019   Bunion of great toe of right foot 09/18/2016   Centrilobular emphysema (HCC) 11/26/2020   Cervical stenosis of spine    CHB (complete heart block) (HCC) 09/28/2020   Coronary artery disease involving native coronary artery of native heart without angina pectoris 08/05/2019   CVA (cerebral vascular accident) (HCC) 03/12/2020   Encounter for general adult medical examination without abnormal findings 05/20/2016   Essential hypertension 03/12/2020   Exertional dyspnea 01/18/2020   Gastro-esophageal reflux disease with esophagitis 09/28/2010   GERD (gastroesophageal reflux disease)    History of bladder cancer 03/14/2014   History of concussion    X2   NO RESIDUALS   History of peptic ulcer 1970'S   Hypercholesterolemia    Hypothyroidism due to Hashimoto's thyroiditis 07/23/2019   LBBB (left bundle branch  block)    Macular degeneration of left eye    Mixed hyperlipidemia 11/24/2013   Formatting of this note might be different from the original. History of TIA.  The goal is to have your total cholesterol < 200, the HDL (good cholesterol) >40, and the LDL (bad cholesterol) <100. It is recomended that you follow a good low fat diet and exercise for 30 minutes 3-4 times a week.   Personal history of tobacco use, presenting hazards to health 01/12/2014   Presence of permanent cardiac pacemaker 11/15/2020   S/P bilateral cataract extraction 09/18/2016   Serum reaction due to vaccination 09/27/2011   Formatting of this note might be different from the original. IMPRESSION: seen on Wednesday for reaction to right arm after pneumovax, had redness, warmth , tenderness and edema of right upper arm which is now resolved. monitor area. use warm compresses prn. call prn. discussed will not need another pneumovax, he will consider zostavax in future.   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   Status post placement of implantable loop recorder 11/08/2014   TIA (transient ischemic attack) 04/09/2014   Past Surgical History:  Procedure Laterality Date   ANTERIOR CERVICAL DECOMP/DISCECTOMY FUSION N/A 01/27/2018   Procedure: Anterior Cervical Discectomy Fusion - Cervical Four- Cervical Five;  Surgeon: Louis Shove, MD;  Location: [facility] OR;  Service: Neurosurgery;  Laterality: N/A;  Anterior Cervical Discectomy Fusion - Cervical Four- Cervical Five   APPENDECTOMY  1970   CARDIOVASCULAR STRESS TEST  03/22/09   CYSTOSCOPY WITH BIOPSY N/A 02/10/2013  Procedure: CYSTOSCOPY WITH COLD CUP BIOPSY/FULGERATION;  Surgeon: Alm GORMAN Fragmin, MD;  Location: [facility];  Service: Urology;  Laterality: N/A;  ALSO FULGERATION    HIP PINNING,CANNULATED Left 03/28/2024   Procedure: FIXATION, FEMUR, NECK, PERCUTANEOUS, USING SCREW;  Surgeon: Edna Toribio LABOR, MD;  Location: [facility] OR;  Service: Orthopedics;   Laterality: Left;   INGUINAL HERNIA REPAIR Right 07-14-2002   INTRAMEDULLARY (IM) NAIL INTERTROCHANTERIC Right 12/12/2023   Procedure: FIXATION, FRACTURE, INTERTROCHANTERIC, WITH INTRAMEDULLARY ROD;  Surgeon: Kendal Franky SQUIBB, MD;  Location: [facility] OR;  Service: Orthopedics;  Laterality: Right;   LEFT HEART CATH AND CORONARY ANGIOGRAPHY N/A 07/13/2019   Procedure: LEFT HEART CATH AND CORONARY ANGIOGRAPHY;  Surgeon: Burnard Debby LABOR, MD;  Location: [facility] INVASIVE CV LAB;  Service: Cardiovascular;  Laterality: N/A;   LOOP RECORDER IMPLANT N/A 05/24/2014   Procedure: LOOP RECORDER IMPLANT;  Surgeon: Jerel Balding, MD;  Location: [facility] CATH LAB;  Service: Cardiovascular;  Laterality: N/A;   LOOP RECORDER REMOVAL N/A 10/02/2020   Procedure: LOOP RECORDER REMOVAL;  Surgeon: Balding Jerel, MD;  Location: [facility];  Service: Cardiovascular;  Laterality: N/A;   NECK SURGERY     PACEMAKER IMPLANT N/A 10/02/2020   Procedure: PACEMAKER IMPLANT;  Surgeon: Balding Jerel, MD;  Location: [facility] INV[facility] CV [facility];  Service: Cardiovascular;  Laterality: N/A;   TONSILLECTOMY     TRANSURETHRAL RESECTION OF BLADDER TUMOR  03-10-2008   TRANSURETHRAL RESECTION OF BLADDER TUMOR Bilateral 12/28/2020   Procedure: TRANSURETHRAL RESECTION OF BLADDER TUMOR BLADDER BIOPSY FULGARATION BILATERAL RETROGRADE PYELOGRAM(TURBT);  Surgeon: Cam Morene ORN, MD;  Location: [facility] ORS;  Service: Urology;  Laterality: Bilateral;   TUMOR EXCISION Left    Left arm   Social History   Socioeconomic History   Marital status: Married    Spouse name: Not on file   Number of children: Not on file   Years of education: Not on file   Highest education level: Not on file  Occupational History   Occupation: retired  Tobacco Use   Smoking status: Former    Current packs/day: 0.00    Average packs/day: 0.5 packs/day for 30.0 years (15.0 ttl pk-yrs)    Types: Cigarettes    Start date: 02/05/1949    Quit date: 02/06/1979    Years since quitting:  45.2   Smokeless tobacco: Never  Vaping Use   Vaping status: Never Used  Substance and Sexual Activity   Alcohol  use: Yes    Comment: occ   Drug use: No   Sexual activity: Not on file  Other Topics Concern   Not on file  Social History Narrative   Not on file   Social Drivers of Health   Financial Resource Strain: Not on file  Food Insecurity: No Food Insecurity (04/01/2024)   Hunger Vital Sign    Worried About Running Out of Food in the Last Year: Never true    Ran Out of Food in the Last Year: Never true  Transportation Needs: No Transportation Needs (04/01/2024)   PRAPARE - Administrator, Civil Service (Medical): No    Lack of Transportation (Non-Medical): No  Physical Activity: Not on file  Stress: Not on file  Social Connections: Socially Integrated (04/01/2024)   Social Connection and Isolation Panel    Frequency of Communication with Friends and Family: More than three times a week    Frequency of Social Gatherings with Friends and Family: More than three times a week    Attends Religious  Services: 1 to 4 times per year    Active Member of Clubs or Organizations: No    Attends Engineer, structural: More than 4 times per year    Marital Status: Married   Family History  Problem Relation Age of Onset   Heart failure Mother    Stroke Father    Diabetes Brother    Heart failure Brother    No Known Allergies   Positive ROS: All other systems have been reviewed and were otherwise negative with the exception of those mentioned in the HPI and as above.  Physical Exam: General: Alert, no acute distress Cardiovascular: No pedal edema Respiratory: No cyanosis, no use of accessory musculature GI: No organomegaly, abdomen is soft and non-tender Skin: No lesions in the area of chief complaint Neurologic: Sensation intact distally Psychiatric: Patient is competent for consent with normal mood and affect Lymphatic: No axillary or cervical  lymphadenopathy  MUSCULOSKELETAL: 2+ DP pulses bilaterally. Dorsiflexion and plantarflexion intact bilaterally. Left leg slightly shortened when compared to right. Pain illicite with slight passive rotation at left hip. No pain with passive motion at right hip. Well healed surgical wounds.   CT scan shows new mildly displaced intertrochanteric left femur fracture. 2. Incomplete healing of the right intertrochanteric femur fracture with possible loosening of the intramedullary nail. Awaiting x-rays to aid in operative planning  Assessment: Left intertrochanteric femur fracture in 89 year old male with recent left femoral neck pinning for left femoral neck fracture 5 weeks ago. Hx of right IM nail for right intertrochanteric femur fracture approximately 5 months ago with incomplete healing.   Plan: This is an acute severe injury, we will need to coordinate with Dr. Chiquita team and our trauma team regarding next steps surgically. For now, patient will be non-weight bearing LLE. I will keep him NPO after midnight in case we are able to add him on to the OR schedule tomorrow.   I have discussed prognosis with patient, wife, and patient [place].    Army MARLA Daring, PA-C   05/04/2024 2:29 PM

## 2024-05-04 NOTE — H&P (View-Only) (Signed)
 ORTHOPAEDIC CONSULTATION  REQUESTING PHYSICIAN: Pokhrel, Vernal, MD  Chief Complaint: left hip pain  HPI: Oscar [name] is a 89 y.o. male who complains of    Oscar [name] is a 89 y.o. male with history of HTN, bladder cancer, CAD, HLD, CVA, GERD, BPH, right intertrochanteric fracture s/p ORIF by Dr. Kendal in 11/2023. On 03/28/24 he underwent a left femoral neck cannluated screw fixation for left femoral neck fracture by Dr. Edna. He had been healing well from this and ambualting on a walker. Earlier today he was seated on the couch and fell off while reaching for the phone. He was brought to the [facility] ED with new left hip pain. Pain is severe with movement and better with rest and pain medication. He lives in assisted living withe his wife.   Past Medical History:  Diagnosis Date   Abnormal nuclear stress test    Abnormal results of thyroid  function studies 01/19/2014   Formatting of this note might be different from the original. August 2016  TSH 4.01   Apneic episode 11/29/2020   Ascending aorta dilatation (HCC) 12/07/2020   Atrial tachycardia, paroxysmal (HCC) 08/05/2019   Bunion of great toe of right foot 09/18/2016   Centrilobular emphysema (HCC) 11/26/2020   Cervical stenosis of spine    CHB (complete heart block) (HCC) 09/28/2020   Coronary artery disease involving native coronary artery of native heart without angina pectoris 08/05/2019   CVA (cerebral vascular accident) (HCC) 03/12/2020   Encounter for general adult medical examination without abnormal findings 05/20/2016   Essential hypertension 03/12/2020   Exertional dyspnea 01/18/2020   Gastro-esophageal reflux disease with esophagitis 09/28/2010   GERD (gastroesophageal reflux disease)    History of bladder cancer 03/14/2014   History of concussion    X2   NO RESIDUALS   History of peptic ulcer 1970'S   Hypercholesterolemia    Hypothyroidism due to Hashimoto's thyroiditis 07/23/2019   LBBB (left bundle branch  block)    Macular degeneration of left eye    Mixed hyperlipidemia 11/24/2013   Formatting of this note might be different from the original. History of TIA.  The goal is to have your total cholesterol < 200, the HDL (good cholesterol) >40, and the LDL (bad cholesterol) <100. It is recomended that you follow a good low fat diet and exercise for 30 minutes 3-4 times a week.   Personal history of tobacco use, presenting hazards to health 01/12/2014   Presence of permanent cardiac pacemaker 11/15/2020   S/P bilateral cataract extraction 09/18/2016   Serum reaction due to vaccination 09/27/2011   Formatting of this note might be different from the original. IMPRESSION: seen on Wednesday for reaction to right arm after pneumovax, had redness, warmth , tenderness and edema of right upper arm which is now resolved. monitor area. use warm compresses prn. call prn. discussed will not need another pneumovax, he will consider zostavax in future.   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   Status post placement of implantable loop recorder 11/08/2014   TIA (transient ischemic attack) 04/09/2014   Past Surgical History:  Procedure Laterality Date   ANTERIOR CERVICAL DECOMP/DISCECTOMY FUSION N/A 01/27/2018   Procedure: Anterior Cervical Discectomy Fusion - Cervical Four- Cervical Five;  Surgeon: Louis Shove, MD;  Location[facility] OR;  Service: Neurosurgery;  Laterality: N/A;  Anterior Cervical Discectomy Fusion - Cervical Four- Cervical Five   APPENDECTOMY  1970   CARDIOVASCULAR STRESS TEST  03/22/09   CYSTOSCOPY WITH BIOPSY N/A 02/10/2013  Procedure: CYSTOSCOPY WITH COLD CUP BIOPSY/FULGERATION;  Surgeon: Alm GORMAN Fragmin, MD;  Location: [facility];  Service: Urology;  Laterality: N/A;  ALSO FULGERATION    HIP PINNING,CANNULATED Left 03/28/2024   Procedure: FIXATION, FEMUR, NECK, PERCUTANEOUS, USING SCREW;  Surgeon: Edna Toribio LABOR, MD;  Location: [facility] OR;  Service: Orthopedics;   Laterality: Left;   INGUINAL HERNIA REPAIR Right 07-14-2002   INTRAMEDULLARY (IM) NAIL INTERTROCHANTERIC Right 12/12/2023   Procedure: FIXATION, FRACTURE, INTERTROCHANTERIC, WITH INTRAMEDULLARY ROD;  Surgeon: Kendal Franky SQUIBB, MD;  Location: [facility] OR;  Service: Orthopedics;  Laterality: Right;   LEFT HEART CATH AND CORONARY ANGIOGRAPHY N/A 07/13/2019   Procedure: LEFT HEART CATH AND CORONARY ANGIOGRAPHY;  Surgeon: Burnard Debby LABOR, MD;  Location: [facility] INV[facility] LAB;  Service: Cardiovascular;  Laterality: N/A;   LOOP RECORDER IMPLANT N/A 05/24/2014   Procedure: LOOP RECORDER IMPLANT;  Surgeon: Jerel Balding, MD;  Location: [facility] CATH LAB;  Service: Cardiovascular;  Laterality: N/A;   LOOP RECORDER REMOVAL N/A 10/02/2020   Procedure: LOOP RECORDER REMOVAL;  Surgeon: Balding Jerel, MD;  Location: [facility] IN[facility];  Service: Cardiovascular;  Laterality: N/A;   NECK SURGERY     PACEMAKER IMPLANT N/A 10/02/2020   Procedure: PACEMAKER IMPLANT;  Surgeon: Balding Jerel, MD;  Location: [facility];  Service: Cardiovascular;  Laterality: N/A;   TONSILLECTOMY     TRANSURETHRAL RESECTION OF BLADDER TUMOR  03-10-2008   TRANSURETHRAL RESECTION OF BLADDER TUMOR Bilateral 12/28/2020   Procedure: TRANSURETHRAL RESECTION OF BLADDER TUMOR BLADDER BIOPSY FULGARATION BILATERAL RETROGRADE PYELOGRAM(TURBT);  Surgeon: Cam Morene ORN, MD;  Location: [facility] ORS;  Service: Urology;  Laterality: Bilateral;   TUMOR EXCISION Left    Left arm   Social History   Socioeconomic History   Marital status: Married    Spouse name: Not on file   Number of children: Not on file   Years of education: Not on file   Highest education level: Not on file  Occupational History   Occupation: retired  Tobacco Use   Smoking status: Former    Current packs/day: 0.00    Average packs/day: 0.5 packs/day for 30.0 years (15.0 ttl pk-yrs)    Types: Cigarettes    Start date: 02/05/1949    Quit date: 02/06/1979    Years since quitting:  45.2   Smokeless tobacco: Never  Vaping Use   Vaping status: Never Used  Substance and Sexual Activity   Alcohol  use: Yes    Comment: occ   Drug use: No   Sexual activity: Not on file  Other Topics Concern   Not on file  Social History Narrative   Not on file   Social Drivers of Health   Financial Resource Strain: Not on file  Food Insecurity: No Food Insecurity (04/01/2024)   Hunger Vital Sign    Worried About Running Out of Food in the Last Year: Never true    Ran Out of Food in the Last Year: Never true  Transportation Needs: No Transportation Needs (04/01/2024)   PRAPARE - Administrator, Civil Service (Medical): No    Lack of Transportation (Non-Medical): No  Physical Activity: Not on file  Stress: Not on file  Social Connections: Socially Integrated (04/01/2024)   Social Connection and Isolation Panel    Frequency of Communication with Friends and Family: More than three times a week    Frequency of Social Gatherings with Friends and Family: More than three times a week    Attends Religious  Services: 1 to 4 times per year    Active Member of Clubs or Organizations: No    Attends Engineer, structural: More than 4 times per year    Marital Status: Married   Family History  Problem Relation Age of Onset   Heart failure Mother    Stroke Father    Diabetes Brother    Heart failure Brother    No Known Allergies   Positive ROS: All other systems have been reviewed and were otherwise negative with the exception of those mentioned in the HPI and as above.  Physical Exam: General: Alert, no acute distress Cardiovascular: No pedal edema Respiratory: No cyanosis, no use of accessory musculature GI: No organomegaly, abdomen is soft and non-tender Skin: No lesions in the area of chief complaint Neurologic: Sensation intact distally Psychiatric: Patient is competent for consent with normal mood and affect Lymphatic: No axillary or cervical  lymphadenopathy  MUSCULOSKELETAL: 2+ DP pulses bilaterally. Dorsiflexion and plantarflexion intact bilaterally. Left leg slightly shortened when compared to right. Pain illicite with slight passive rotation at left hip. No pain with passive motion at right hip. Well healed surgical wounds.   CT scan shows new mildly displaced intertrochanteric left femur fracture. 2. Incomplete healing of the right intertrochanteric femur fracture with possible loosening of the intramedullary nail. Awaiting x-rays to aid in operative planning  Assessment: Left intertrochanteric femur fracture in 89 year old male with recent left femoral neck pinning for left femoral neck fracture 5 weeks ago. Hx of right IM nail for right intertrochanteric femur fracture approximately 5 months ago with incomplete healing.   Plan: This is an acute severe injury, we will need to coordinate with Dr. Chiquita team and our trauma team regarding next steps surgically. For now, patient will be non-weight bearing LLE. I will keep him NPO after midnight in case we are able to add him on to the OR schedule tomorrow.   I have discussed prognosis with patient, wife, and patient [place].    Army MARLA Daring, PA-C   05/04/2024 2:29 PM

## 2024-05-04 NOTE — Telephone Encounter (Signed)
 Copied from CRM #[id number]. Topic: Clinical - Medical Advice >> May 04, 2024  8:12 AM Franky GRADE wrote: Reason for CRM: Patient suffered a fall this morning and is being transported to [facility].

## 2024-05-04 NOTE — ED Notes (Signed)
 Pt having wide complex rhythm.  EKG performed and given to EDP

## 2024-05-04 NOTE — ED Notes (Signed)
 Pt requesting c-collar removal.  EDP notified.

## 2024-05-05 ENCOUNTER — Inpatient Hospital Stay (HOSPITAL_COMMUNITY)

## 2024-05-05 ENCOUNTER — Encounter (HOSPITAL_COMMUNITY)
Admission: EM | Disposition: A | Discharge status: Skilled Nursing Facility | Payer: Self-pay | Source: Home / Self Care | Attending: Internal Medicine

## 2024-05-05 ENCOUNTER — Encounter (HOSPITAL_COMMUNITY): Payer: Self-pay | Admitting: Internal Medicine

## 2024-05-05 DIAGNOSIS — S72002A Fracture of unspecified part of neck of left femur, initial encounter for closed fracture: Secondary | ICD-10-CM | POA: Diagnosis not present

## 2024-05-05 DIAGNOSIS — I2510 Atherosclerotic heart disease of native coronary artery without angina pectoris: Secondary | ICD-10-CM

## 2024-05-05 DIAGNOSIS — J449 Chronic obstructive pulmonary disease, unspecified: Secondary | ICD-10-CM

## 2024-05-05 DIAGNOSIS — Z87891 Personal history of nicotine dependence: Secondary | ICD-10-CM

## 2024-05-05 HISTORY — PX: INTRAMEDULLARY (IM) NAIL INTERTROCHANTERIC: SHX5875

## 2024-05-05 LAB — MAGNESIUM: Magnesium: 2 mg/dL (ref 1.7–2.4)

## 2024-05-05 LAB — CBC
HCT: 36.8 % — ABNORMAL LOW (ref 39.0–52.0)
Hemoglobin: 11.7 g/dL — ABNORMAL LOW (ref 13.0–17.0)
MCH: 29.1 pg (ref 26.0–34.0)
MCHC: 31.8 g/dL (ref 30.0–36.0)
MCV: 91.5 fL (ref 80.0–100.0)
Platelets: 179 K/uL (ref 150–400)
RBC: 4.02 MIL/uL — ABNORMAL LOW (ref 4.22–5.81)
RDW: 16.4 % — ABNORMAL HIGH (ref 11.5–15.5)
WBC: 8.8 K/uL (ref 4.0–10.5)
nRBC: 0 % (ref 0.0–0.2)

## 2024-05-05 LAB — SURGICAL PCR SCREEN
MRSA, PCR: NEGATIVE
Staphylococcus aureus: NEGATIVE

## 2024-05-05 LAB — GLUCOSE, CAPILLARY: Glucose-Capillary: 118 mg/dL — ABNORMAL HIGH (ref 70–99)

## 2024-05-05 LAB — BASIC METABOLIC PANEL WITH GFR
Anion gap: 11 (ref 5–15)
BUN: 16 mg/dL (ref 8–23)
CO2: 21 mmol/L — ABNORMAL LOW (ref 22–32)
Calcium: 8.8 mg/dL — ABNORMAL LOW (ref 8.9–10.3)
Chloride: 104 mmol/L (ref 98–111)
Creatinine, Ser: 0.76 mg/dL (ref 0.61–1.24)
GFR, Estimated: >60 mL/min (ref >60)
Glucose, Bld: 119 mg/dL — ABNORMAL HIGH (ref 70–99)
Potassium: 3.7 mmol/L (ref 3.5–5.1)
Sodium: 136 mmol/L (ref 135–145)

## 2024-05-05 LAB — TYPE AND SCREEN
ABO/RH(D): O POS
Antibody Screen: NEGATIVE

## 2024-05-05 SURGERY — FIXATION, FRACTURE, INTERTROCHANTERIC, WITH INTRAMEDULLARY ROD
Anesthesia: General | Laterality: Left

## 2024-05-05 MED ORDER — DEXAMETHASONE SODIUM PHOSPHATE 10 MG/ML IJ SOLN
INTRAMUSCULAR | Status: DC | PRN
  Administered 2024-05-05 (×2): 5 mg via INTRAVENOUS

## 2024-05-05 MED ORDER — CEFAZOLIN SODIUM-DEXTROSE 2-3 GM-%(50ML) IV SOLR
INTRAVENOUS | Status: DC | PRN
  Administered 2024-05-05 (×2): 2 g via INTRAVENOUS

## 2024-05-05 MED ORDER — ALBUMIN HUMAN 5 % IV SOLN: INTRAVENOUS | Status: DC | PRN

## 2024-05-05 MED ORDER — FENTANYL CITRATE (PF) 100 MCG/2ML IJ SOLN
INTRAMUSCULAR | Status: AC
  Filled 2024-05-05: qty 2

## 2024-05-05 MED ORDER — ISOPROPYL ALCOHOL 70 % SOLN
Status: DC | PRN
  Administered 2024-05-05 (×2): 1 via TOPICAL

## 2024-05-05 MED ORDER — PHENYLEPHRINE HCL-NACL 20-0.9 MG/250ML-% IV SOLN
INTRAVENOUS | Status: DC | PRN
  Administered 2024-05-05: 40 ug/min via INTRAVENOUS
  Administered 2024-05-05 (×2): 160 ug via INTRAVENOUS
  Administered 2024-05-05: 240 ug via INTRAVENOUS
  Administered 2024-05-05: 40 ug/min via INTRAVENOUS
  Administered 2024-05-05: 240 ug via INTRAVENOUS

## 2024-05-05 MED ORDER — EPHEDRINE SULFATE-NACL 50-0.9 MG/10ML-% IV SOSY
PREFILLED_SYRINGE | INTRAVENOUS | Status: DC | PRN
Start: 2024-05-05 — End: 2024-05-05
  Administered 2024-05-05 (×2): 10 mg via INTRAVENOUS

## 2024-05-05 MED ORDER — SUGAMMADEX SODIUM 200 MG/2ML IV SOLN
INTRAVENOUS | Status: DC | PRN
  Administered 2024-05-05 (×2): 200 mg via INTRAVENOUS

## 2024-05-05 MED ORDER — TRANEXAMIC ACID-NACL 1000-0.7 MG/100ML-% IV SOLN
INTRAVENOUS | Status: AC
Start: 2024-05-05 — End: 2024-05-06
  Filled 2024-05-05: qty 100

## 2024-05-05 MED ORDER — HEPARIN SODIUM (PORCINE) 5000 UNIT/ML IJ SOLN
5000.0000 [IU] | Freq: Three times a day (TID) | INTRAMUSCULAR | Status: DC
  Administered 2024-05-05 (×2): 5000 [IU] via SUBCUTANEOUS
  Filled 2024-05-05: qty 1

## 2024-05-05 MED ORDER — PROPOFOL 10 MG/ML IV BOLUS
INTRAVENOUS | Status: AC
  Filled 2024-05-05: qty 20

## 2024-05-05 MED ORDER — FENTANYL CITRATE (PF) 250 MCG/5ML IJ SOLN
INTRAMUSCULAR | Status: DC | PRN
  Administered 2024-05-05 (×4): 50 ug via INTRAVENOUS

## 2024-05-05 MED ORDER — LIDOCAINE HCL (PF) 2 % IJ SOLN
INTRAMUSCULAR | Status: DC | PRN
  Administered 2024-05-05 (×2): 60 mg via INTRADERMAL

## 2024-05-05 MED ORDER — HYDRALAZINE HCL 20 MG/ML IJ SOLN: 10.0000 mg | Freq: Four times a day (QID) | INTRAMUSCULAR | Status: DC | PRN

## 2024-05-05 MED ORDER — CEFAZOLIN SODIUM-DEXTROSE 2-4 GM/100ML-% IV SOLN
INTRAVENOUS | Status: AC
  Filled 2024-05-05: qty 100

## 2024-05-05 MED ORDER — ENOXAPARIN SODIUM 40 MG/0.4ML IJ SOSY
40.0000 mg | PREFILLED_SYRINGE | INTRAMUSCULAR | Status: DC
  Administered 2024-05-06 – 2024-05-07 (×2): 40 mg via SUBCUTANEOUS
  Filled 2024-05-05 (×2): qty 0.4

## 2024-05-05 MED ORDER — ORAL CARE MOUTH RINSE: 15.0000 mL | Freq: Once | OROMUCOSAL | Status: AC

## 2024-05-05 MED ORDER — ROCURONIUM BROMIDE 10 MG/ML (PF) SYRINGE
PREFILLED_SYRINGE | INTRAVENOUS | Status: DC | PRN
Start: 2024-05-05 — End: 2024-05-05
  Administered 2024-05-05 (×2): 60 mg via INTRAVENOUS

## 2024-05-05 MED ORDER — CEFAZOLIN SODIUM-DEXTROSE 2-4 GM/100ML-% IV SOLN
2.0000 g | Freq: Three times a day (TID) | INTRAVENOUS | Status: AC
  Administered 2024-05-06 (×2): 2 g via INTRAVENOUS
  Filled 2024-05-05 (×2): qty 100

## 2024-05-05 MED ORDER — CHLORHEXIDINE GLUCONATE 0.12 % MT SOLN
15.0000 mL | Freq: Once | OROMUCOSAL | Status: AC
  Administered 2024-05-05 (×2): 15 mL via OROMUCOSAL

## 2024-05-05 MED ORDER — 0.9 % SODIUM CHLORIDE (POUR BTL) OPTIME
TOPICAL | Status: DC | PRN
  Administered 2024-05-05 (×2): 1000 mL

## 2024-05-05 MED ORDER — ALBUMIN HUMAN 5 % IV SOLN
INTRAVENOUS | Status: AC
  Filled 2024-05-05: qty 250

## 2024-05-05 MED ORDER — VANCOMYCIN HCL 1000 MG IV SOLR
INTRAVENOUS | Status: AC
  Filled 2024-05-05: qty 20

## 2024-05-05 MED ORDER — ONDANSETRON HCL 4 MG/2ML IJ SOLN
INTRAMUSCULAR | Status: DC | PRN
  Administered 2024-05-05 (×2): 4 mg via INTRAVENOUS

## 2024-05-05 MED ORDER — BUPIVACAINE-EPINEPHRINE (PF) 0.25% -1:200000 IJ SOLN
INTRAMUSCULAR | Status: AC
  Filled 2024-05-05: qty 30

## 2024-05-05 MED ORDER — BUPIVACAINE-EPINEPHRINE 0.25% -1:200000 IJ SOLN
INTRAMUSCULAR | Status: DC | PRN
  Administered 2024-05-05 (×2): 20 mL

## 2024-05-05 MED ORDER — PROPOFOL 10 MG/ML IV BOLUS
INTRAVENOUS | Status: DC | PRN
  Administered 2024-05-05 (×2): 80 mg via INTRAVENOUS

## 2024-05-05 MED ORDER — TRANEXAMIC ACID 1000 MG/10ML IV SOLN
INTRAVENOUS | Status: DC | PRN
  Administered 2024-05-05 (×2): 1000 mg via INTRAVENOUS

## 2024-05-05 MED ORDER — LACTATED RINGERS IV SOLN: INTRAVENOUS | Status: DC

## 2024-05-05 MED ORDER — ONDANSETRON 4 MG PO TBDP: 4.0000 mg | ORAL_TABLET | Freq: Three times a day (TID) | ORAL | Status: DC | PRN

## 2024-05-05 SURGICAL SUPPLY — 40 items
BAG COUNTER SPONGE SURGICOUNT (BAG) ×1 IMPLANT
BIT DRILL INTERTAN LAG SCREW (BIT) IMPLANT
BIT DRILL SHORT 4.0 (BIT) IMPLANT
CHLORAPREP W/TINT 26 (MISCELLANEOUS) ×1 IMPLANT
COVER MAYO STAND STRL (DRAPES) IMPLANT
COVER PERINEAL POST (MISCELLANEOUS) ×1 IMPLANT
COVER SURGICAL LIGHT HANDLE (MISCELLANEOUS) ×1 IMPLANT
DRAPE C-ARM 42X120 X-RAY (DRAPES) ×1 IMPLANT
DRAPE C-ARMOR (DRAPES) ×1 IMPLANT
DRAPE STERI IOBAN 125X83 (DRAPES) ×1 IMPLANT
DRAPE U-SHAPE 47X51 STRL (DRAPES) ×2 IMPLANT
DRESSING MEPILEX FLEX 4X4 (GAUZE/BANDAGES/DRESSINGS) IMPLANT
DRSG MEPITEL 4X7.2 (GAUZE/BANDAGES/DRESSINGS) IMPLANT
DRSG MEPITEL 8X12 (GAUZE/BANDAGES/DRESSINGS) IMPLANT
DRSG TEGADERM 4X4.75 (GAUZE/BANDAGES/DRESSINGS) ×3 IMPLANT
ELECT REM PT RETURN 15FT ADLT (MISCELLANEOUS) IMPLANT
GAUZE SPONGE 4X4 12PLY STRL (GAUZE/BANDAGES/DRESSINGS) ×1 IMPLANT
GLOVE BIO SURGEON STRL SZ 6.5 (GLOVE) ×1 IMPLANT
GLOVE BIOGEL PI IND STRL 6.5 (GLOVE) ×1 IMPLANT
GLOVE BIOGEL PI IND STRL 8 (GLOVE) ×1 IMPLANT
GLOVE SURG ORTHO 8.0 STRL STRW (GLOVE) ×1 IMPLANT
GOWN STRL REUS W/ TWL XL LVL3 (GOWN DISPOSABLE) ×2 IMPLANT
KIT BASIN OR (CUSTOM PROCEDURE TRAY) ×1 IMPLANT
KIT TURNOVER KIT A (KITS) ×1 IMPLANT
MANIFOLD NEPTUNE II (INSTRUMENTS) ×1 IMPLANT
MAT ABSORB FLUID 56X50 GRAY (MISCELLANEOUS) ×1 IMPLANT
NAIL IM TRI 11.5X400 130D LT (Nail) IMPLANT
NDL SAFETY ECLIPSE 18X1.5 (NEEDLE) ×1 IMPLANT
NS IRRIG 1000ML POUR BTL (IV SOLUTION) ×1 IMPLANT
PACK GENERAL/GYN (CUSTOM PROCEDURE TRAY) ×1 IMPLANT
PIN GUIDE 3.2X343MM (PIN) IMPLANT
ROD GUIDE 3.0 (MISCELLANEOUS) IMPLANT
SCREW LAG COMPR KIT 90/85 (Screw) IMPLANT
SCREW TRIGEN LOW PROF 5.0X42.5 (Screw) IMPLANT
SUT MNCRL AB 3-0 PS2 18 (SUTURE) ×1 IMPLANT
SUT VIC AB 0 CT1 36 (SUTURE) ×2 IMPLANT
SUT VIC AB 1 CT1 36 (SUTURE) ×1 IMPLANT
SUT VIC AB 2-0 CT2 27 (SUTURE) ×2 IMPLANT
SYR 30ML LL (SYRINGE) ×1 IMPLANT
TOWEL OR 17X26 10 PK STRL BLUE (TOWEL DISPOSABLE) ×1 IMPLANT

## 2024-05-05 NOTE — Transfer of Care (Signed)
 Immediate Anesthesia Transfer of Care Note  Patient: Oscar [name]  Procedure(s) Performed: FIXATION, FRACTURE, INTERTROCHANTERIC, WITH INTRAMEDULLARY ROD (Left)  Patient Location: PACU  Anesthesia Type:General  Level of Consciousness: drowsy  Airway & Oxygen Therapy: Patient Spontanous Breathing and Patient connected to nasal cannula oxygen  Post-op Assessment: Report given to RN and Post -op Vital signs reviewed and stable  Post vital signs: Reviewed and stable  Last Vitals:  Vitals Value Taken Time  BP 148/76 05/05/24 20:22  Temp    Pulse 105 05/05/24 20:26  Resp 17 05/05/24 20:26  SpO2 92 % 05/05/24 20:26  Vitals shown include unfiled device data.  Last Pain:  Vitals:   05/05/24 1634  TempSrc: Oral  PainSc:       Patients Stated Pain Goal: 2 (05/05/24 1026)  Complications: No notable events documented.

## 2024-05-05 NOTE — Plan of Care (Signed)

## 2024-05-05 NOTE — Progress Notes (Signed)
 Nurse Luke, brought up upper denture from OR. Dentures at bedside in the pink container. Pt's ring and alarm neck chain given to case manager (from [facility]) per Luke.

## 2024-05-05 NOTE — Interval H&P Note (Signed)
 The patient has been re-examined, and the chart reviewed, and there have been no interval changes to the documented history and physical.    Plan for left hip removal of screws and implantation of hip nail for intertrochanteric femur fracture  The operative side was examined and the patient was confirmed to have sensation to DPN, SPN, TN intact, Motor EHL, ext, flex 5/5, and DP 2+, PT 2+, No significant edema.   The risks, benefits, and alternatives have been discussed at length with patient, and the patient is willing to proceed and his daughter.  Left hip marked. Consent has been signed.

## 2024-05-05 NOTE — Progress Notes (Addendum)
 Pt alert and oriented *2 with some confusion. CHG bath given at 1250. MRSA swab collected and sent to lab. Notified Daughter Farmer Mccahill about the scheduled surgery at 4:30 pm. She said she will update the other daughter [facility]) who lives in [place]. Pt refused the staffs to get the 1400 vital signs. PRN pain med given as ordered for pain. Report given to the Short stay nurse Rick).

## 2024-05-05 NOTE — Progress Notes (Addendum)
 PROGRESS NOTE    Oscar [name]  FMW:[id number] DOB: June 29, 1931 DOA: 05/04/2024 PCP: Frann Mabel Mt, DO   Brief Narrative:   89 years old male with past medical history of COPD, CVA, coronary artery disease, hypertension, hyperlipidemia with recent hip surgery approximately 5 weeks back for hip fracture treated with screws presented to the hospital from assisted living facility after sustaining a mechanical fall. CT cervical spine with chronic findings including chronic T3-T4 compression fracture suspected. CT of the hip and pelvis showed new mildly displaced intertrochanteric left femur fracture and incomplete healing with possible loosening of the intramedullary nail. Orthopedics on board with plans for surgical intervention. Needs SNF on discharge.  Assessment & Plan:  Principal Problem:   Closed left hip fracture (HCC)    Left femur mildly displaced intertrochanteric fracture,POA:  Recent history of left hip fracture with screw plate fixation, on 7/6 Orthopedics on board. Patient has been kept NPO since midnight Prn analgesics, antiemetics Plan is for surgical intervention.   Hyperlipidemia. Continue Statin    Hypertension.  Prn IV hydralazine  for SBP > 160 mmHg.   History of CAD.  No active issues.  Continue statins  Likely Alzheimer's dementia: Patient is disoriented and confused. No focal abnormalities.  No evidence of agitation or delirium at this time Will monitor neuro status closely. I personally reviewed CT head which didn't show any acute abnormalities.  Disposition: SNF    DVT prophylaxis:  Heparin  [address] Q8H     Code Status: Full Code Family Communication:  None at the bedside Status is: Inpatient Remains inpatient appropriate because: Left hip fracture    Subjective:  Patient appears confused and disoriented. He didn't know where he was or where he came from. He didn't remember having a left surgery in the recent past. Denies any active  complaints.  Examination:  General exam: Alert but not oriented to person or place Respiratory system: Clear to auscultation. Respiratory effort normal. Cardiovascular system: S1 & S2 heard, RRR. No JVD, murmurs, rubs, gallops or clicks. No pedal edema. Gastrointestinal system: Abdomen is nondistended, soft and nontender. No organomegaly or masses felt. Normal bowel sounds heard. Central nervous system: Alert and oriented. No focal neurological deficits. Extremities: Limited movement of left hip/leg secondary to left hip fracture Skin: No rashes, lesions or ulcers Psychiatry: Disoriented, confused       Diet Orders (From admission, onward)     Start     Ordered   05/05/24 0901  Diet NPO time specified Except for: Ice Chips, Sips with Meds  Diet effective midnight       Question Answer Comment  Except for Ice Chips   Except for Sips with Meds      05/05/24 0901            Objective: Vitals:   05/04/24 1232 05/04/24 1451 05/05/24 0623 05/05/24 0633  BP: (!) 161/94 (!) 149/82 (!) 159/90   Pulse: 72 78 80   Resp: 15 20 19    Temp: 98 F (36.7 C) 98.7 F (37.1 C) 97.8 F (36.6 C)   TempSrc: Oral Oral Oral   SpO2: 100% 99% 100%   Weight:    62.8 kg    Intake/Output Summary (Last 24 hours) at 05/05/2024 0902 Last data filed at 05/05/2024 0600 Gross per 24 hour  Intake 608.78 ml  Output 300 ml  Net 308.78 ml   Filed Weights   05/05/24 0633  Weight: 62.8 kg    Scheduled Meds:  docusate sodium   100  mg Oral BID   Continuous Infusions:  sodium chloride  75 mL/hr at 05/05/24 0137    Nutritional status     Body mass index is 21.05 kg/m.  Data Reviewed:   CBC: Recent Labs  Lab 05/04/24 0837 05/05/24 0338  WBC 5.8 8.8  NEUTROABS 4.2  --   HGB 12.4* 11.7*  HCT 38.6* 36.8*  MCV 90.8 91.5  PLT 196 179   Basic Metabolic Panel: Recent Labs  Lab 05/04/24 0837 05/05/24 0338  NA 136 136  K 4.0 3.7  CL 103 104  CO2 24 21*  GLUCOSE 106* 119*  BUN 18  16  CREATININE 0.82 0.76  CALCIUM  9.6 8.8*  MG  --  2.0   GFR: Estimated Creatinine Clearance: 53.4 mL/min (by C-G formula based on SCr of 0.76 mg/dL). Liver Function Tests: No results for input(s): AST, ALT, ALKPHOS, BILITOT, PROT, ALBUMIN  in the last 168 hours. No results for input(s): LIPASE, AMYLASE in the last 168 hours. No results for input(s): AMMONIA in the last 168 hours. Coagulation Profile: No results for input(s): INR, PROTIME in the last 168 hours. Cardiac Enzymes: No results for input(s): CKTOTAL, CKMB, CKMBINDEX, TROPONINI in the last 168 hours. BNP (last 3 results) No results for input(s): PROBNP in the last 8760 hours. HbA1C: No results for input(s): HGBA1C in the last 72 hours. CBG: No results for input(s): GLUCAP in the last 168 hours. Lipid Profile: No results for input(s): CHOL, HDL, LDLCALC, TRIG, CHOLHDL, LDLDIRECT in the last 72 hours. Thyroid  Function Tests: No results for input(s): TSH, T4TOTAL, FREET4, T3FREE, THYROIDAB in the last 72 hours. Anemia Panel: No results for input(s): VITAMINB12, FOLATE, FERRITIN, TIBC, IRON, RETICCTPCT in the last 72 hours. Sepsis Labs: No results for input(s): PROCALCITON, LATICACIDVEN in the last 168 hours.  No results found for this or any previous visit (from the past 240 hours).       Radiology Studies: CT PELVIS WO CONTRAST Result Date: 05/04/2024 CLINICAL DATA:  Left hip pain after falling today. Concern for fracture. EXAM: CT PELVIS WITHOUT CONTRAST TECHNIQUE: Multidetector CT imaging of the pelvis was performed following the standard protocol without intravenous contrast. RADIATION DOSE REDUCTION: This exam was performed according to the departmental dose-optimization program which includes automated exposure control, adjustment of the mA and/or kV according to patient size and/or use of iterative reconstruction technique. COMPARISON:  None  recent. Left femur radiographs 04/01/2024. CT pelvis 09/19/2021. Left hip CT 03/27/2024. FINDINGS: Urinary Tract: The visualized distal ureters and bladder appear unremarkable. Bowel: No bowel wall thickening, distention or surrounding inflammation identified within the pelvis. Mild diverticular changes within the distal colon. Vascular/Lymphatic: No enlarged pelvic lymph nodes identified. Aortoiliac atherosclerosis without evidence of aneurysm. Reproductive: Mild enlargement of the prostate gland. Other: No pelvic ascites or pneumoperitoneum. Musculoskeletal: Since the prior pelvic CT, the patient has undergone right femoral compression screw plate fixation (12/12/2023) and left femoral neck pinning (03/28/2024). There is a new mildly displaced intertrochanteric left femur fracture. Deformity of the left femoral neck from previous subcapital fracture is grossly unchanged. The right femoral hardware is incompletely visualized. There is possible loosening of the intramedullary nail. There is incomplete healing of the right intertrochanteric femur fracture with sclerotic margins anteriorly. No evidence of dislocation or acute pelvic fracture. There is a new severe biconcave compression fracture at L5 with associated osseous retropulsion. This results in approximately 75% loss of vertebral body height. This fracture is probably present on the 03/28/2024 radiographs. IMPRESSION: 1. New mildly displaced intertrochanteric left femur  fracture. 2. Incomplete healing of the right intertrochanteric femur fracture with possible loosening of the intramedullary nail. 3. New severe biconcave compression fracture at L5 with associated osseous retropulsion since previous pelvic CT in 2022. This fracture is probably present on the 03/28/2024 radiographs. Correlate clinically. 4. No evidence of dislocation or acute pelvic fracture. 5.  Aortic Atherosclerosis (ICD10-I70.0). Electronically Signed   By: Elsie Perone M.D.   On:  05/04/2024 12:26   DG Chest 1 View Result Date: 05/04/2024 CLINICAL DATA:  Fall from couch this morning.  Left hip pain. EXAM: CHEST  1 VIEW COMPARISON:  Radiographs 03/27/2024 and 12/11/2023.  CT 12/15/2020. FINDINGS: 1124 hours. Left subclavian pacemaker leads appear unchanged, projecting over the right atrium and right ventricle. The heart size and mediastinal contours are stable with aortic atherosclerosis. Mild atelectasis at both lung bases. No edema, confluent airspace disease, pleural effusion or pneumothorax. No acute fractures are identified within the chest. IMPRESSION: Mild bibasilar atelectasis. No evidence of acute chest injury. Electronically Signed   By: Elsie Perone M.D.   On: 05/04/2024 12:09   CT CERVICAL SPINE WO CONTRAST Result Date: 05/04/2024 CLINICAL DATA:  89 year old male status post fall from couch. Pain. EXAM: CT CERVICAL SPINE WITHOUT CONTRAST TECHNIQUE: Multidetector CT imaging of the cervical spine was performed without intravenous contrast. Multiplanar CT image reconstructions were also generated. RADIATION DOSE REDUCTION: This exam was performed according to the departmental dose-optimization program which includes automated exposure control, adjustment of the mA and/or kV according to patient size and/or use of iterative reconstruction technique. COMPARISON:  Head CT today.  Cervical spine CT 03/27/2024. FINDINGS: Alignment: Stable. Maintained cervicothoracic junction alignment and bilateral posterior element alignment. Skull base and vertebrae: Stable bone mineralization. Visualized skull base is intact. No atlanto-occipital dissociation. Occipital-C1 and anterior C1-C2 joint degeneration. Those levels appear intact and aligned. Postoperative changes detailed below. No acute osseous abnormality identified. Soft tissues and spinal canal: No prevertebral fluid or swelling. No visible canal hematoma. Negative for age visible noncontrast neck soft tissues. Disc levels: C4-C5  ACDF hardware with solid ankylosis or arthrodesis C3 through C6. Stable adjacent segment disease at C6-C7 with vacuum disc. Stable CT appearance of cervical spine degeneration since last month. Upper chest: Visible upper thoracic levels appear stable including subtle T3 and partially visible T4 superior endplate compression. Stable upper lobe lung scarring. Left chest cardiac pacemaker device redemonstrated. Calcified aortic atherosclerosis. Fluid containing but nondilated proximal thoracic esophagus. IMPRESSION: 1. No acute traumatic injury identified in the cervical spine. 2. 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 Stable advanced adjacent segment disease at C6-C7. 3. Mild chronic T3 and T4 upper thoracic compression fractures suspected. Aortic Atherosclerosis (ICD10-I70.0). Electronically Signed   By: VEAR Hurst M.D.   On: 05/04/2024 09:46   CT HEAD WO CONTRAST Result Date: 05/04/2024 CLINICAL DATA:  89 year old male status post fall from couch. Pain. EXAM: CT HEAD WITHOUT CONTRAST TECHNIQUE: Contiguous axial images were obtained from the base of the skull through the vertex without intravenous contrast. RADIATION DOSE REDUCTION: This exam was performed according to the departmental dose-optimization program which includes automated exposure control, adjustment of the mA and/or kV according to patient size and/or use of iterative reconstruction technique. COMPARISON:  Brain MRI 09/14/2020.  Head CT 03/31/2024. FINDINGS: Brain: Cerebral volume does not appear significantly changed since the 2021 MRI. No midline shift, ventriculomegaly, mass effect, evidence of mass lesion, intracranial hemorrhage or evidence of cortically based acute infarction. Patchy mild to moderate chronic periventricular  white matter hypodensity with some deep white matter capsule involvement on the left. Stable gray-white matter differentiation throughout the brain. Vascular: Calcified atherosclerosis at the  skull base. No suspicious intracranial vascular hyperdensity. Skull: Stable and intact. Sinuses/Orbits: Visualized paranasal sinuses and mastoids are clear. Other: No acute orbit or scalp soft tissue injury identified. IMPRESSION: 1. No acute intracranial abnormality or acute traumatic injury identified. 2. Chronic white matter disease stable since a 2021 MRI. Electronically Signed   By: VEAR Hurst M.D.   On: 05/04/2024 09:42       LOS: 1 day   Time spent= 42 mins    Deliliah Room, MD [facility]  If 7PM-7AM, please contact night-coverage  05/05/2024, 9:02 AM

## 2024-05-05 NOTE — Anesthesia Procedure Notes (Signed)
 Procedure Name: Intubation Date/Time: 05/05/2024 6:38 PM  Performed by: Lanning Cena RAMAN, CRNAPre-anesthesia Checklist: Patient identified, Emergency Drugs available, Suction available, Patient being monitored and Timeout performed Patient Re-evaluated:Patient Re-evaluated prior to induction Oxygen Delivery Method: Circle system utilized Preoxygenation: Pre-oxygenation with 100% oxygen Induction Type: Inhalational induction Ventilation: Mask ventilation without difficulty Laryngoscope Size: Mac and 4 Grade View: Grade I Tube type: Oral Tube size: 7.5 mm Number of attempts: 1 Airway Equipment and Method: Stylet Placement Confirmation: ETT inserted through vocal cords under direct vision, CO2 detector, breath sounds checked- equal and bilateral and positive ETCO2 Secured at: 22 cm Tube secured with: Tape Dental Injury: Teeth and Oropharynx as per pre-operative assessment

## 2024-05-05 NOTE — Anesthesia Preprocedure Evaluation (Addendum)
 Anesthesia Evaluation  Patient identified by MRN, date of birth, ID band Patient confused    Reviewed: Allergy & Precautions, NPO status , Patient's Chart, lab work & pertinent test results  History of Anesthesia Complications Negative for: history of anesthetic complications  Airway Mallampati: Unable to assess  TM Distance: >3 FB     Dental  (+) Dental Advisory Given   Pulmonary COPD, former smoker   breath sounds clear to auscultation       Cardiovascular hypertension, + CAD  + dysrhythmias + pacemaker  Rhythm:Regular  1. Left ventricular ejection fraction, by estimation, is 60 to 65%. The  left ventricle has normal function. The left ventricle has no regional  wall motion abnormalities. There is mild concentric left ventricular  hypertrophy. Left ventricular diastolic  parameters are consistent with Grade I diastolic dysfunction (impaired  relaxation).   2. Right ventricular systolic function is normal. The right ventricular  size is normal.   3. The mitral valve is normal in structure. No evidence of mitral valve  regurgitation. No evidence of mitral stenosis.   4. The aortic valve is tricuspid. There is mild calcification of the  aortic valve. Aortic valve regurgitation is not visualized. Aortic valve  sclerosis/calcification is present, without any evidence of aortic  stenosis.   5. Aortic dilatation noted. There is mild dilatation of the ascending  aorta, measuring 41 mm.   6. The inferior vena cava is normal in size with greater than 50%  respiratory variability, suggesting right atrial pressure of 3 mmHg.     Neuro/Psych neg Seizures CVA, No Residual Symptoms  negative psych ROS   GI/Hepatic Neg liver ROS,GERD  Medicated and Controlled,,  Endo/Other  Hypothyroidism    Renal/GU      Musculoskeletal LEFT HIP FRACTURE    Abdominal   Peds  Hematology Lab Results      Component                Value                Date                      WBC                      8.8                 05/05/2024                HGB                      11.7 (L)            05/05/2024                HCT                      36.8 (L)            05/05/2024                MCV                      91.5                05/05/2024                PLT  179                 05/05/2024              Anesthesia Other Findings   Reproductive/Obstetrics                              Anesthesia Physical Anesthesia Plan  ASA: 3  Anesthesia Plan: General   Post-op Pain Management:    Induction: Intravenous  PONV Risk Score and Plan: 2 and Propofol  infusion  Airway Management Planned: Oral ETT  Additional Equipment: None  Intra-op Plan:   Post-operative Plan: Extubation in OR  Informed Consent: I have reviewed the patients History and Physical, chart, labs and discussed the procedure including the risks, benefits and alternatives for the proposed anesthesia with the patient or authorized representative who has indicated his/her understanding and acceptance.     Dental advisory given  Plan Discussed with: CRNA  Anesthesia Plan Comments:          Anesthesia Quick Evaluation

## 2024-05-05 NOTE — Op Note (Signed)
 DATE OF SURGERY:  05/05/2024  TIME: 7:57 PM  PATIENT NAME:  Oscar [name]  AGE: 89 y.o.  PRE-OPERATIVE DIAGNOSIS: Left subtrochanteric femur fracture around cannulated screws  POST-OPERATIVE DIAGNOSIS:  SAME  PROCEDURE: Left hip removal of cannulated screws with conversion to intramedullary hip nail  SURGEON:  Shavon Zenz A Chaka Boyson  ASSISTANT: Bernarda Mclean, PA-C was present and scrubbed throughout the case, critical for assistance with exposure, retraction, instrumentation, and closure.  OPERATIVE IMPLANTS:  [employer] Intertan Nail 11.5 x , 90/85 lag compression screw, 1 distal interlock  Implant Name Type Inv. Item Serial No. Manufacturer Lot No. LRB No. Used Action  NAIL IM TRI 11.5X400 130D LT - [id number] Nail NAIL IM TRI 11.5X400 130D LT  [employer] [id number] Left 1 Implanted  SCREW LAG COMPR KIT 90/85 - [id number] Screw SCREW LAG COMPR KIT 90/85  [employer] [id number] Left 1 Implanted  SCREW TRIGEN LOW PROF 5.0X42.5 - [id number] Screw SCREW TRIGEN LOW PROF 5.0X42.5  [employer] [id number] Left 1 Implanted   ESTIMATED BLOOD LOSS: 150cc  PREOPERATIVE INDICATIONS:  Oscar [name] is a 89 y.o. year old who had undergone cannulated screw fixation of left valgus impacted femoral neck fracture 5 weeks ago.  He was progressing appropriately but had a fall yesterday resulting in another fracture now below the screws.  Given the location of fracture recommended removal of the screws and conversion to a hip nail.  He was brought into the ER and then admitted and optimized and then elected for surgical intervention.    The risks benefits and alternatives were discussed with the patient including but not limited to the risks of nonoperative treatment, versus surgical intervention including infection, bleeding, nerve injury, malunion, nonunion, hardware prominence, hardware failure, need for hardware removal, blood clots,  cardiopulmonary complications, morbidity, mortality, among others, and they were willing to proceed.    OPERATIVE PROCEDURE:  The patient was brought to the operating room and placed in the supine position. Anesthesia was administered. He was placed on the fracture table.  Closed reduction was performed under C-arm guidance.  Time out was then performed after sterile prep and drape. He received preoperative antibiotics.  Prior incision was reopened and the cannulated screws were identified under fluoroscopy and removed without difficulty.  Small incision proximal to the greater trochanter was made and carried down through skin and subcutaneous tissue.  Threaded guidewire was directed at the tip of the greater trochanter and advanced into the proximal metaphysis.  Positioning was confirmed with fluoroscopy.  I then used an entry reamer to enter the medullary canal.  Guidewire was passed down to the knee.  Nail length with this measured.  The canal was reamed up until 13 mm which had good chatter.  Thus we elected for 11.5 mm diameter nail.   I then passed a 10 x 400 mm InterTAN down the center of the canal attached to the targeting arm.  I then used the targeting arm to make a percutaneous incision and directed a threaded guidewire up into the head/neck segment.  I confirmed adequate tip apex distance and measured the length.  I decided to place a 90 mm screw.  I then drilled the path for the compression screw and placed an antirotation bar.  I then placed the lag screw and then placed the compression screw and compressed approximately 3 mm.  The proximal portion of the nail was statically locked.    The targeting arm was  removed.  Final fluoroscopic imaging was obtained around the hip.  We then used perfect circle technique to place 1 distal interlock statically.  The wounds were irrigated copiously, and vancomycin  powder was placed in the wounds.  The gluteal fascia was closed with 0 Vicryl, and skin  was closed with 2-0 Vicryl and 3-0 Monocryl.  Sterile dressing was applied with Dermabond, 4 x 4 gauze, and Tegaderm.  The patient was awakened and returned to PACU in stable and satisfactory condition. There were no complications and the patient tolerated the procedure well.   Post op recs: WB: WBAT RLE Abx: ancef  x23 hours post op Imaging: PACU xrays Dressing: keep intact until follow up, change PRN if soiled or saturated. DVT prophylaxis: lovenox  starting POD1 x4 weeks Follow up: 2 weeks after surgery for a wound check with Dr. Edna at [facility].  Address: [address], [place], [place] [address]  Office Phone: [phone]  Toribio Edna, MD Orthopaedic Surgery

## 2024-05-05 NOTE — Discharge Instructions (Addendum)
 Orthopedic Discharge Instructions  Diet: As you were doing prior to hospitalization   Shower:  May shower but keep the wounds dry, use an occlusive plastic wrap, NO SOAKING IN TUB.  If the bandage gets wet, change with a clean dry gauze.  If you have a splint on, leave the splint in place and keep the splint dry with a plastic bag.  Dressing:  You may change your dressing 3-5 days after surgery.  If the dressing remains clean and dry it can also be left on until follow up.  If you change the dressing replace with clean gauze and tape or ace wrap.  For most surgeries, the stitches used are dissolvable and don't need to be removed.  However, depending on your surgery, you may have stitches that will need to be removed in the office in about 2 weeks.    Activity:  Increase activity slowly as tolerated, but follow the weight bearing instructions below.  Do not drive for the next 4-6 weeks.  In addition, you cannot be taking narcotics while you drive, and you must feel in control of the vehicle.    Weight Bearing:   You may bear weight on your surgical leg as tolerated.    Blood clot prevention (DVT Prophylaxis): After surgery you are at an increased risk for a blood clot. you were prescribed a blood thinner, Aspirin 81 mg, to be taken twice daily for a total of 4 weeks from surgery to help reduce your risk of getting a blood clot. Signs of a pulmonary embolus (blood clot in the lungs) include sudden short of breath, feeling lightheaded or dizzy, chest pain with a deep breath, rapid pulse rapid breathing.  Signs of a blood clot in your arms or legs include new unexplained swelling and cramping, warm, red or darkened skin around the painful area.  Please call the office or 911 right away if these signs or symptoms develop.  To prevent constipation: you may use a stool softener such as - Colace (over the counter) 100 mg by mouth twice a day  Drink plenty of fluids (prune juice may be helpful) and high fiber  foods Miralax (over the counter) for constipation as needed.    Itching:  If you experience itching with your medications, try taking only a single pain pill, or even half a pain pill at a time.  You may take up to 10 pain pills per day, and you can also use benadryl over the counter for itching or also to help with sleep.   Precautions:  If you experience chest pain or shortness of breath - call 911 immediately for transfer to the hospital emergency department!!  Call office [phone]) for the following: Temperature greater than 101F Persistent nausea and vomiting Severe uncontrolled pain Redness, tenderness, or signs of infection (pain, swelling, redness, odor or green/yellow discharge around the site) Difficulty breathing, headache or visual disturbances Hives Persistent dizziness or light-headedness Extreme fatigue Any other questions or concerns you may have after discharge  In an emergency, call 911 or go to an Emergency Department at a nearby hospital  Follow- Up Appointment:  Please call for an appointment to be seen approximately 2-3 week after surgery in [facility] with your surgeon Dr. Weber Cooks - [phone] Address: [address], [place], [place] [address]

## 2024-05-06 ENCOUNTER — Encounter (HOSPITAL_COMMUNITY): Payer: Self-pay | Admitting: Orthopedic Surgery

## 2024-05-06 DIAGNOSIS — S72002A Fracture of unspecified part of neck of left femur, initial encounter for closed fracture: Secondary | ICD-10-CM | POA: Diagnosis not present

## 2024-05-06 LAB — CBC
HCT: 29.8 % — ABNORMAL LOW (ref 39.0–52.0)
Hemoglobin: 9 g/dL — ABNORMAL LOW (ref 13.0–17.0)
MCH: 28.4 pg (ref 26.0–34.0)
MCHC: 30.2 g/dL (ref 30.0–36.0)
MCV: 94 fL (ref 80.0–100.0)
Platelets: 135 K/uL — ABNORMAL LOW (ref 150–400)
RBC: 3.17 MIL/uL — ABNORMAL LOW (ref 4.22–5.81)
RDW: 16.4 % — ABNORMAL HIGH (ref 11.5–15.5)
WBC: 7 K/uL (ref 4.0–10.5)
nRBC: 0 % (ref 0.0–0.2)

## 2024-05-06 LAB — COMPREHENSIVE METABOLIC PANEL WITH GFR
ALT: 11 U/L (ref 0–44)
AST: 20 U/L (ref 15–41)
Albumin: 3.1 g/dL — ABNORMAL LOW (ref 3.5–5.0)
Alkaline Phosphatase: 76 U/L (ref 38–126)
Anion gap: 11 (ref 5–15)
BUN: 15 mg/dL (ref 8–23)
CO2: 18 mmol/L — ABNORMAL LOW (ref 22–32)
Calcium: 8.1 mg/dL — ABNORMAL LOW (ref 8.9–10.3)
Chloride: 101 mmol/L (ref 98–111)
Creatinine, Ser: 0.8 mg/dL (ref 0.61–1.24)
GFR, Estimated: >60 mL/min (ref >60)
Glucose, Bld: 143 mg/dL — ABNORMAL HIGH (ref 70–99)
Potassium: 4.2 mmol/L (ref 3.5–5.1)
Sodium: 130 mmol/L — ABNORMAL LOW (ref 135–145)
Total Bilirubin: 0.7 mg/dL (ref 0.0–1.2)
Total Protein: 5.6 g/dL — ABNORMAL LOW (ref 6.5–8.1)

## 2024-05-06 MED ORDER — HYDROCODONE-ACETAMINOPHEN 5-325 MG PO TABS
1.0000 | ORAL_TABLET | Freq: Four times a day (QID) | ORAL | Status: DC | PRN
  Administered 2024-05-06 – 2024-05-07 (×4): 1 via ORAL
  Filled 2024-05-06 (×4): qty 1

## 2024-05-06 MED ORDER — MORPHINE SULFATE (PF) 2 MG/ML IV SOLN: 0.5000 mg | INTRAVENOUS | Status: DC | PRN

## 2024-05-06 MED ORDER — SODIUM CHLORIDE 0.9 % IV SOLN: INTRAVENOUS | Status: DC

## 2024-05-06 NOTE — Progress Notes (Addendum)
 PROGRESS NOTE    Oscar [name]  FMW:[id number] DOB: October 15, 1930 DOA: 05/04/2024 PCP: Frann Mabel Mt, DO   Brief Narrative:   89 years old male with past medical history of COPD, CVA, coronary artery disease, hypertension, hyperlipidemia with recent hip surgery approximately 5 weeks back for hip fracture treated with screws presented to the hospital from assisted living facility after sustaining a mechanical fall. CT cervical spine with chronic findings including chronic T3-T4 compression fracture suspected. CT of the hip and pelvis showed new mildly displaced intertrochanteric left femur fracture and incomplete healing with possible loosening of the intramedullary nail. Orthopedics on board with plans for surgical intervention. Needs SNF on discharge.  Assessment & Plan:  Principal Problem:   Closed left hip fracture (HCC)    Left femur mildly displaced intertrochanteric fracture,POA:  s/p Left hip removal of cannulated screws with conversion to intramedullary hip nail. POD#1 Recent history of left hip fracture with screw plate fixation, on 7/6 Orthopedics on board. Prn analgesics, antiemetics PT OT eval SNF on discharge  Hyponatremia: Sodium level is 130 today, likely hypovolemia, started on NS and f/u BMP in am.   Hyperlipidemia. Continue Statin    Hypertension.  Prn IV hydralazine  for SBP > 160 mmHg.   History of CAD.  No active issues.  Continue statins  Likely Alzheimer's dementia: Intermittent episodes of disorientation and confusion. No focal abnormalities.  No evidence of agitation or delirium at this time Will monitor neuro status closely. I personally reviewed CT head which didn't show any acute abnormalities.  Disposition: SNF    DVT prophylaxis: enoxaparin  (LOVENOX ) injection 40 mg Start: 05/06/24 0900 Heparin  [address] [place] Q8H     Code Status: Full Code Family Communication:  None at the bedside Status is: Inpatient Remains inpatient appropriate because:  Left hip fracture, underwent surgery    Subjective:  Patient appears alert and awake this morning. He said that  I am ready to run like a horse this morning.  Examination:  General exam: Alert and oriented x 2 Respiratory system: Clear to auscultation. Respiratory effort normal. Cardiovascular system: S1 & S2 heard, RRR. No JVD, murmurs, rubs, gallops or clicks. No pedal edema. Gastrointestinal system: Abdomen is nondistended, soft and nontender. No organomegaly or masses felt. Normal bowel sounds heard. Central nervous system: Alert and oriented x 2. No focal neurological deficits. Extremities: s/p left hip revision, dressing in place, no discharge Skin: No rashes, lesions or ulcers Psychiatry: alert and awake       Diet Orders (From admission, onward)     Start     Ordered   05/05/24 2216  Diet regular Room service appropriate? Yes; Fluid consistency: Thin  Diet effective now       Question Answer Comment  Room service appropriate? Yes   Fluid consistency: Thin      05/05/24 2215            Objective: Vitals:   05/05/24 2337 05/06/24 0128 05/06/24 0549 05/06/24 0935  BP: 101/71 102/88 (!) 95/59 (!) 89/62  Pulse: (!) 104 84 71 75  Resp: 19 16 19 16   Temp: 98.5 F (36.9 C) 98 F (36.7 C) 98 F (36.7 C)   TempSrc: Oral  Oral   SpO2: 92% 100% 100% 100%  Weight:        Intake/Output Summary (Last 24 hours) at 05/06/2024 1001 Last data filed at 05/06/2024 0630 Gross per 24 hour  Intake 1815.95 ml  Output 675 ml  Net 1140.95 ml   Fredricka  Weights   05/05/24 0633  Weight: 62.8 kg    Scheduled Meds:  docusate sodium   100 mg Oral BID   enoxaparin  (LOVENOX ) injection  40 mg Subcutaneous Q24H   Continuous Infusions:  sodium chloride  75 mL/hr at 05/06/24 0827    ceFAZolin  (ANCEF ) IV 2 g (05/06/24 0245)    Nutritional status     Body mass index is 21.05 kg/m.  Data Reviewed:   CBC: Recent Labs  Lab 05/04/24 0837 05/05/24 0338 05/06/24 0606   WBC 5.8 8.8 7.0  NEUTROABS 4.2  --   --   HGB 12.4* 11.7* 9.0*  HCT 38.6* 36.8* 29.8*  MCV 90.8 91.5 94.0  PLT 196 179 135*   Basic Metabolic Panel: Recent Labs  Lab 05/04/24 0837 05/05/24 0338 05/06/24 0338  NA 136 136 130*  K 4.0 3.7 4.2  CL 103 104 101  CO2 24 21* 18*  GLUCOSE 106* 119* 143*  BUN 18 16 15   CREATININE 0.82 0.76 0.80  CALCIUM  9.6 8.8* 8.1*  MG  --  2.0  --    GFR: Estimated Creatinine Clearance: 53.4 mL/min (by C-G formula based on SCr of 0.8 mg/dL). Liver Function Tests: Recent Labs  Lab 05/06/24 0338  AST 20  ALT 11  ALKPHOS 76  BILITOT 0.7  PROT 5.6*  ALBUMIN  3.1*   No results for input(s): LIPASE, AMYLASE in the last 168 hours. No results for input(s): AMMONIA in the last 168 hours. Coagulation Profile: No results for input(s): INR, PROTIME in the last 168 hours. Cardiac Enzymes: No results for input(s): CKTOTAL, CKMB, CKMBINDEX, TROPONINI in the last 168 hours. BNP (last 3 results) No results for input(s): PROBNP in the last 8760 hours. HbA1C: No results for input(s): HGBA1C in the last 72 hours. CBG: Recent Labs  Lab 05/05/24 2034  GLUCAP 118*   Lipid Profile: No results for input(s): CHOL, HDL, LDLCALC, TRIG, CHOLHDL, LDLDIRECT in the last 72 hours. Thyroid  Function Tests: No results for input(s): TSH, T4TOTAL, FREET4, T3FREE, THYROIDAB in the last 72 hours. Anemia Panel: No results for input(s): VITAMINB12, FOLATE, FERRITIN, TIBC, IRON, RETICCTPCT in the last 72 hours. Sepsis Labs: No results for input(s): PROCALCITON, LATICACIDVEN in the last 168 hours.  Recent Results (from the past 240 hours)  Surgical PCR screen     Status: None   Collection Time: 05/05/24 12:44 PM   Specimen: Nasal Mucosa; Nasal Swab  Result Value Ref Range Status   MRSA, PCR NEGATIVE NEGATIVE Final   Staphylococcus aureus NEGATIVE NEGATIVE Final    Comment: (NOTE) The Xpert SA Assay (FDA  approved for NASAL specimens in patients 71 years of age and older), is one component of a comprehensive surveillance program. It is not intended to diagnose infection nor to guide or monitor treatment. Performed at [facility], [address], [place], [place] [address]          Radiology Studies: DG FEMUR PORT MIN 2 VIEWS LEFT Result Date: 05/06/2024 CLINICAL DATA:  Status post left femoral ORIF EXAM: LEFT FEMUR PORTABLE 2 VIEWS COMPARISON:  Intraoperative films from the previous day. FINDINGS: Medullary rod is noted within the proximal left femur. Compression screws are seen extending into the femoral head. Distal fixation screw in the femur is noted as well. Fracture fragments are in near anatomic alignment. IMPRESSION: Status post ORIF proximal left femoral fracture. Electronically Signed   By: Oneil Devonshire M.D.   On: 05/06/2024 01:04   DG FEMUR MIN 2 VIEWS LEFT Result Date:  05/05/2024 CLINICAL DATA:  Elective surgery. EXAM: LEFT FEMUR 2 VIEWS COMPARISON:  Hip radiograph earlier today FINDINGS: Thirteen fluoroscopic spot views of the left femur submitted from the operating room. The screws traversing the femoral neck have been removed. Interval femoral intramedullary nail with distal locking and trans trochanteric screw fixation traversing proximal femur fracture. Fluoroscopy time 1 minutes 7 seconds. Dose 9.2181 mGy. IMPRESSION: Intraoperative fluoroscopy during ORIF of proximal femur fracture. Electronically Signed   By: Andrea Gasman M.D.   On: 05/05/2024 20:45   DG C-Arm 1-60 Min-No Report Result Date: 05/05/2024 Fluoroscopy was utilized by the requesting physician.  No radiographic interpretation.   DG C-Arm 1-60 Min-No Report Result Date: 05/05/2024 Fluoroscopy was utilized by the requesting physician.  No radiographic interpretation.   DG HIP UNILAT W OR W/O PELVIS 2-3 VIEWS LEFT Result Date: 05/05/2024 CLINICAL DATA:  Hip fracture. EXAM: DG HIP (WITH OR  WITHOUT PELVIS) 2-3V LEFT COMPARISON:  Pelvic CT yesterday FINDINGS: Three screws traverse the left femoral neck. Mildly displaced intertrochanteric fracture is grossly stable from CT yesterday allowing for differences in modality. No new fracture. Right femoral intramedullary nail with trans trochanteric and distal locking screw fixation. Pubic rami and bony pelvis are intact. The bones are subjectively under mineralized. IMPRESSION: Three screws traverse the left femoral neck. Mildly displaced intertrochanteric fracture is grossly stable from CT yesterday allowing for differences in modality. Electronically Signed   By: Andrea Gasman M.D.   On: 05/05/2024 15:37   CT PELVIS WO CONTRAST Result Date: 05/04/2024 CLINICAL DATA:  Left hip pain after falling today. Concern for fracture. EXAM: CT PELVIS WITHOUT CONTRAST TECHNIQUE: Multidetector CT imaging of the pelvis was performed following the standard protocol without intravenous contrast. RADIATION DOSE REDUCTION: This exam was performed according to the departmental dose-optimization program which includes automated exposure control, adjustment of the mA and/or kV according to patient size and/or use of iterative reconstruction technique. COMPARISON:  None recent. Left femur radiographs 04/01/2024. CT pelvis 09/19/2021. Left hip CT 03/27/2024. FINDINGS: Urinary Tract: The visualized distal ureters and bladder appear unremarkable. Bowel: No bowel wall thickening, distention or surrounding inflammation identified within the pelvis. Mild diverticular changes within the distal colon. Vascular/Lymphatic: No enlarged pelvic lymph nodes identified. Aortoiliac atherosclerosis without evidence of aneurysm. Reproductive: Mild enlargement of the prostate gland. Other: No pelvic ascites or pneumoperitoneum. Musculoskeletal: Since the prior pelvic CT, the patient has undergone right femoral compression screw plate fixation (12/12/2023) and left femoral neck pinning  (03/28/2024). There is a new mildly displaced intertrochanteric left femur fracture. Deformity of the left femoral neck from previous subcapital fracture is grossly unchanged. The right femoral hardware is incompletely visualized. There is possible loosening of the intramedullary nail. There is incomplete healing of the right intertrochanteric femur fracture with sclerotic margins anteriorly. No evidence of dislocation or acute pelvic fracture. There is a new severe biconcave compression fracture at L5 with associated osseous retropulsion. This results in approximately 75% loss of vertebral body height. This fracture is probably present on the 03/28/2024 radiographs. IMPRESSION: 1. New mildly displaced intertrochanteric left femur fracture. 2. Incomplete healing of the right intertrochanteric femur fracture with possible loosening of the intramedullary nail. 3. New severe biconcave compression fracture at L5 with associated osseous retropulsion since previous pelvic CT in 2022. This fracture is probably present on the 03/28/2024 radiographs. Correlate clinically. 4. No evidence of dislocation or acute pelvic fracture. 5.  Aortic Atherosclerosis (ICD10-I70.0). Electronically Signed   By: Elsie Perone M.D.   On: 05/04/2024 12:26  DG Chest 1 View Result Date: 05/04/2024 CLINICAL DATA:  Fall from couch this morning.  Left hip pain. EXAM: CHEST  1 VIEW COMPARISON:  Radiographs 03/27/2024 and 12/11/2023.  CT 12/15/2020. FINDINGS: 1124 hours. Left subclavian pacemaker leads appear unchanged, projecting over the right atrium and right ventricle. The heart size and mediastinal contours are stable with aortic atherosclerosis. Mild atelectasis at both lung bases. No edema, confluent airspace disease, pleural effusion or pneumothorax. No acute fractures are identified within the chest. IMPRESSION: Mild bibasilar atelectasis. No evidence of acute chest injury. Electronically Signed   By: Elsie Perone M.D.   On:  05/04/2024 12:09       LOS: 2 days   Time spent= 42 mins    Deliliah Room, MD [facility]  If 7PM-7AM, please contact night-coverage  05/06/2024

## 2024-05-06 NOTE — Progress Notes (Signed)
     Subjective:  Patient reports pain as mild.  Lying comfortably in bed this morning.  He is in good spirits.  Denies distal numbness and tingling.  Plan for mobilization with therapy today.  Yesterday's total administered Morphine  [place]ig[place] Equivalents: 43   Objective:   VITALS:   Vitals:   05/05/24 2121 05/05/24 2337 05/06/24 0128 05/06/24 0549  BP: 127/87 101/71 102/88 (!) 95/59  Pulse: 98 (!) 104 84 71  Resp: 19 19 16 19   Temp: 99 F (37.2 C) 98.5 F (36.9 C) 98 F (36.7 C) 98 F (36.7 C)  TempSrc: Oral Oral    SpO2:  92% 100% 100%  Weight:       Post op recs: WB: WBAT RLE Abx: ancef  x23 hours post op Imaging: PACU xrays Dressing: keep intact until follow up, change PRN if soiled or saturated. DVT prophylaxis: lovenox  starting POD1 x4 weeks Follow up: 2 weeks after surgery for a wound check with Dr. Edna at [facility].  Address: [address], [place], [place] [address]  Office Phone: [phone]    Lab Results  Component Value Date   WBC 8.8 05/05/2024   HGB 11.7 (L) 05/05/2024   HCT 36.8 (L) 05/05/2024   MCV 91.5 05/05/2024   PLT 179 05/05/2024   BMET    Component Value Date/Time   NA 130 (L) 05/06/2024 0338   NA 138 05/14/2021 1135   K 4.2 05/06/2024 0338   CL 101 05/06/2024 0338   CO2 18 (L) 05/06/2024 0338   GLUCOSE 143 (H) 05/06/2024 0338   BUN 15 05/06/2024 0338   BUN 18 05/14/2021 1135   CREATININE 0.80 05/06/2024 0338   CALCIUM  8.1 (L) 05/06/2024 0338   EGFR 68 05/14/2021 1135   GFRNONAA >60 05/06/2024 0338      Xray: Intertrochanteric nail fixation in good alignment no adverse features  Assessment/Plan: 1 Day Post-Op   Principal Problem:   Closed left hip fracture (HCC)  Status post left hip revision from cannulated screws to hip nail for subtrochanteric femur fracture 05/05/24  Post op recs: WB: WBAT RLE Abx: ancef  x23 hours post op Imaging: PACU xrays Dressing: keep intact until follow up,  change PRN if soiled or saturated. DVT prophylaxis: lovenox  starting POD1 x4 weeks Follow up: 2 weeks after surgery for a wound check with Dr. Edna at [facility].  Address: [address], [place], [place] [address]  Office Phone: [phone]    TORIBIO DELENA EDNA 05/06/2024, 6:33 AM   TORIBIO Edna, MD  Contact information:   [phone] 7am-5pm epic message Dr. Edna, or call office for patient follow up: [phone] After hours and holidays please check [email or web address] for group call information for [facility]

## 2024-05-06 NOTE — Evaluation (Signed)
 Physical Therapy Evaluation Patient Details Name: Oscar [name] MRN: [id number] DOB: Jul 08, 1931 Today's Date: 05/06/2024  History of Present Illness  89 years old male presented to the hospital from ILF [facility] after sustaining a mechanical fall. CT cervical spine with chronic findings including chronic T3-T4 compression fracture suspected. CT of the hip and pelvis showed new mildly displaced intertrochanteric left femur fracture and incomplete healing with possible loosening of the intramedullary nail. Orthopedics on board and pt s/p Left hip removal of cannulated screws with conversion to intramedullary hip nail on 05/05/24.  Recent admission after fall and L hip femoral neck fracture s/p ORIF 7/6 and recent fall 11/2023 with R hip fracture/ORIF.  Other PMH HTN, GERD, h/o bladder CA, LBBB, L eye macular degeneration, CHB s/p PPM, h/o CVA.  Clinical Impression  Patient is s/p above surgery resulting in functional limitations due to the deficits listed below (see PT Problem List).  Patient will benefit from acute skilled PT to increase their independence and safety with mobility to facilitate discharge.  Pt pleasant and agreeable to participate.  Noted and observed pt with episodes of confusion as pt confusing various items (piece of paper and also tape/gauze from previous lab draw) with lines/leads.  However, pt was able to follow commands and recalls going to SNF after recent hip fracture.  Pt currently requiring some assist for mobility and not able tolerate ambulating today.  Pt also with multiple recent falls per chart review.  Patient will benefit from continued inpatient follow up therapy, <3 hours/day.          If plan is discharge home, recommend the following: A lot of help with walking and/or transfers;A lot of help with bathing/dressing/bathroom;Assistance with cooking/housework;Assist for transportation;Help with stairs or ramp for entrance;Supervision due to cognitive status   Can  travel by private vehicle   No    Equipment Recommendations None recommended by PT  Recommendations for Other Services       Functional Status Assessment Patient has had a recent decline in their functional status and demonstrates the ability to make significant improvements in function in a reasonable and predictable amount of time.     Precautions / Restrictions Precautions Precautions: Fall Restrictions LLE Weight Bearing Per Provider Order: Weight bearing as tolerated      Mobility  Bed Mobility Overal bed mobility: Needs Assistance Bed Mobility: Supine to Sit     Supine to sit: Max assist, +2 for physical assistance     General bed mobility comments: assist for LEs over EOB, trunk upright, and scooting to EOB    Transfers Overall transfer level: Needs assistance Equipment used: Rolling walker (2 wheels) Transfers: Sit to/from Stand Sit to Stand: Min assist, +2 safety/equipment           General transfer comment: multimodal cues for safety and technique, observed L LE buckling with transfer requiring assist but pt also self assisting/supporting with RW, pt only felt able to tolerate transfer to recliner today, SPO2 93% on room air after transfer    Ambulation/Gait                  Stairs            Wheelchair Mobility     Tilt Bed    Modified Rankin (Stroke Patients Only)       Balance Overall balance assessment: History of Falls  Pertinent Vitals/Pain Pain Assessment Pain Assessment: Faces Faces Pain Scale: Hurts even more Pain Location: left hip Pain Intervention(s): Monitored during session, Repositioned    Home Living Family/patient expects to be discharged to:: Skilled nursing facility Living Arrangements: Spouse/significant other;Children                 Additional Comments: from [place] ILF    Prior Function Prior Level of Function : Needs  assist;Patient poor historian/Family not available             Mobility Comments: using RW since previous hip fractures, reports still working with HHPT       Extremity/Trunk Assessment        Lower Extremity Assessment Lower Extremity Assessment: Generalized weakness;LLE deficits/detail LLE Deficits / Details: assist due to pain       Communication   Communication Communication: No apparent difficulties    Cognition Arousal: Alert Behavior During Therapy: Restless                           PT - Cognition Comments: no formal dementia dx per history however noted Intermittent episodes of disorientation and confusion per MD notes this admission and also observed during session Following commands: Intact       Cueing Cueing Techniques: Verbal cues, Gestural cues, Tactile cues     General Comments      Exercises     Assessment/Plan    PT Assessment Patient needs continued PT services  PT Problem List Decreased strength;Decreased activity tolerance;Decreased balance;Decreased mobility;Decreased knowledge of use of DME;Pain       PT Treatment Interventions Gait training;Balance training;DME instruction;Functional mobility training;Therapeutic activities;Therapeutic exercise;Patient/family education    PT Goals (Current goals can be found in the Care Plan section)  Acute Rehab PT Goals PT Goal Formulation: With patient Time For Goal Achievement: 05/20/24 Potential to Achieve Goals: Good    Frequency Min 3X/week     Co-evaluation               AM-PAC PT 6 Clicks Mobility  Outcome Measure Help needed turning from your back to your side while in a flat bed without using bedrails?: A Lot Help needed moving from lying on your back to sitting on the side of a flat bed without using bedrails?: A Lot Help needed moving to and from a bed to a chair (including a wheelchair)?: A Lot Help needed standing up from a chair using your arms (e.g.,  wheelchair or bedside chair)?: A Lot Help needed to walk in hospital room?: Total Help needed climbing 3-5 steps with a railing? : Total 6 Click Score: 10    End of Session Equipment Utilized During Treatment: Gait belt Activity Tolerance: Patient limited by fatigue;Patient limited by pain Patient left: in chair;with chair alarm set;with call bell/phone within reach Nurse Communication: Mobility status (aware pt in recliner, chair alarm active, cognitive deficits observed) PT Visit Diagnosis: Difficulty in walking, not elsewhere classified (R26.2);History of falling (Z91.81)    Time: 8397-8382 PT Time Calculation (min) (ACUTE ONLY): 15 min   Charges:   PT Evaluation $PT Eval Low Complexity: 1 Low   PT General Charges $$ ACUTE PT VISIT: 1 Visit       Tari PT, DPT Physical Therapist Acute Rehabilitation Services Office: [phone]   Tari CROME [place] 05/06/2024, 4:59 PM

## 2024-05-06 NOTE — Plan of Care (Signed)
  Problem: Clinical Measurements: Goal: Ability to maintain clinical measurements within normal limits will improve Outcome: Progressing Goal: Will remain free from infection Outcome: Progressing Goal: Diagnostic test results will improve Outcome: Progressing Goal: Respiratory complications will improve Outcome: Progressing   Problem: Education: Goal: Knowledge of General Education information will improve Description: Including pain rating scale, medication(s)/side effects and non-pharmacologic comfort measures Outcome: Not Progressing   Problem: Health Behavior/Discharge Planning: Goal: Ability to manage health-related needs will improve Outcome: Not Progressing

## 2024-05-06 NOTE — TOC Initial Note (Signed)
 Transition of Care [facility]) - Initial/Assessment Note    Patient Details  Name: Oscar [name] MRN: [id number] Date of Birth: 1931/01/12  Transition of Care [facility]) CM/SW Contact:    NORMAN ASPEN, LCSW Phone Number: 05/06/2024, 1:58 PM  Clinical Narrative:                  Spoke with pt's daughter, Dorthea Sink, yesterday as pt was awaiting surgery for hip fx.  Daughter confirms that pt and wife (who suffers with dementia and requires 24/7 supervision) have [place] living in [place] apt at [facility].  Pt is primary caregiver for wife as no family living locally.  They have been working with Alfonso Holms, a community case manager, who has assisted with securing IL and care for wife while pt is hospitalized.  Daughter gives permission for staff to speak with Ms. Harmon about plans for this hospitalization/ discharge.  Ms. Holms confirms that pt's wife now has 24/7 caregivers via [facility] and family fully anticipating pt will need SNF (again) after this surgery.  Of note, pt has been to [facility] in the past and they would prefer he return there if possible.  (Have alerted facility).   Met with pt today who is aware he had surgery and was agreeable with plan for SNF.  Awaiting PT evaluation and recommendations.  Have alerted [facility] to preference of family.    Expected Discharge Plan: Skilled Nursing Facility Barriers to Discharge: Continued Medical Work up, English as a second language teacher, SNF Pending bed offer   Patient Goals and CMS Choice Patient states their goals for this hospitalization and ongoing recovery are:: return to [place] with wife is patient goal          Expected Discharge Plan and Services In-house Referral: Clinical Social Work   Post Acute Care Choice: Skilled Nursing Facility Living arrangements for the past 2 months: Independent Living Facility ([facility] [place])                 DME Arranged: N/A DME Agency: NA                  Prior Living  Arrangements/Services Living arrangements for the past 2 months: Independent Living Facility ([facility] [place]) Lives with:: Spouse Patient language and need for interpreter reviewed:: Yes Do you feel safe going back to the place where you live?: Yes      Need for Family Participation in Patient Care: Yes (Comment) Care giver support system in place?: Yes (comment)   Criminal Activity/Legal Involvement Pertinent to Current Situation/Hospitalization: No - Comment as needed  Activities of Daily Living   ADL Screening (condition at time of admission) Independently performs ADLs?: No Does the patient have a NEW difficulty with bathing/dressing/toileting/self-feeding that is expected to last >3 days?: Yes (Initiates electronic notice to provider for possible OT consult) Does the patient have a NEW difficulty with getting in/out of bed, walking, or climbing stairs that is expected to last >3 days?: Yes (Initiates electronic notice to provider for possible PT consult) Does the patient have a NEW difficulty with communication that is expected to last >3 days?: No Is the patient deaf or have difficulty hearing?: No Does the patient have difficulty seeing, even when wearing glasses/contacts?: No Does the patient have difficulty concentrating, remembering, or making decisions?: No  Permission Sought/Granted Permission sought to share information with : Case Manager, Family Supports, Other (comment), Facility Medical sales representative Permission granted to share information with : Yes, Verbal  Permission Granted  Share Information with NAME: daughter, Dorthea Sink @ [phone]  Permission granted to share info w AGENCY: SNFs; community case manager, Alfonso Holms @ [phone]        Emotional Assessment Appearance:: Appears stated age Attitude/Demeanor/Rapport: Engaged, Gracious Affect (typically observed): Accepting, Pleasant Orientation: : Oriented to Self, Oriented to Place Alcohol  /  Substance Use: Not Applicable Psych Involvement: No (comment)  Admission diagnosis:  Closed left hip fracture (HCC) [S72.002A] Closed fracture of left hip, initial encounter (HCC) [S72.002A] Fall, initial encounter [W19.XXXA] Patient Active Problem List   Diagnosis Date Noted   Closed left hip fracture (HCC) 05/04/2024   Syncope 04/01/2024   Ground-level fall 03/28/2024   Closed displaced fracture of left femoral neck (HCC) 12/12/2023   Normocytic anemia 12/12/2023   Chronic heart failure with preserved ejection fraction (HFpEF) (HCC) 12/12/2023   OAB (overactive bladder) 11/19/2023   Benign prostatic hyperplasia with nocturia 11/19/2023   Hypercholesterolemia 10/09/2021   Bladder tumor 01/04/2021   Cervical stenosis of spine 01/04/2021   GERD (gastroesophageal reflux disease) 01/04/2021   LBBB (left bundle branch block) 01/04/2021   Ascending aorta dilatation (HCC) 12/07/2020   Apneic episode 11/29/2020   Centrilobular emphysema (HCC) 11/26/2020   Presence of permanent cardiac pacemaker 11/15/2020   Hyponatremia 11/13/2020   E. coli infection    History of appendectomy    History of concussion    History of peptic ulcer    Irregular heart beat    CHB (complete heart block) (HCC) 09/28/2020   Hospital discharge follow-up 03/28/2020   CVA (cerebral vascular accident) (HCC) 03/12/2020   Acute CVA (cerebrovascular accident) (HCC) 03/12/2020   Hypothyroidism (acquired) 03/12/2020   Essential hypertension 03/12/2020   Atrial tachycardia (HCC) 01/24/2020   Exertional dyspnea 01/18/2020   Atrial tachycardia, paroxysmal (HCC) 08/05/2019   Coronary artery disease involving native coronary artery of native heart without angina pectoris 08/05/2019   Hypothyroidism due to Hashimoto's thyroiditis 07/23/2019   Abnormal nuclear stress test    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   Cervical myelopathy (HCC) 01/27/2018   Hyperlipidemia 11/20/2017   Bunion of great  toe of right foot 09/18/2016   S/P bilateral cataract extraction 09/18/2016   Encounter for general adult medical examination without abnormal findings 05/20/2016   Status post placement of implantable loop recorder 11/08/2014   Near syncope 05/16/2014   TIA (transient ischemic attack) 04/09/2014   History of bladder cancer 03/14/2014   Abnormal results of thyroid  function studies 01/19/2014   Personal history of tobacco use, presenting hazards to health 01/12/2014   Transient cerebral ischemia 01/12/2014   Mixed hyperlipidemia 11/24/2013   Adult body mass index 26.0-26.9 06/07/2013   Cancer of bladder (HCC) 02/10/2013   Serum reaction due to vaccination 09/27/2011   Gastro-esophageal reflux disease with esophagitis 09/28/2010   PCP:  Frann Mabel Mt, DO Pharmacy:   [facility] [place], [place] - [address] [place] [address] Phone: [phone] Fax: [phone]  [facility] [address] [place], [place] - [address] [place] [address] Phone: [phone] Fax: [phone]  DARRYLE LONG - [facility] [address] [place] [address] Phone: [phone] Fax: [phone]     Social Drivers of Health (SDOH) Social History: SDOH Screenings   Food Insecurity: No Food Insecurity (05/04/2024)  Housing: Low Risk  (05/04/2024)  Transportation Needs: No Transportation Needs (05/04/2024)  Utilities: Not At Risk (05/04/2024)  Depression (PHQ2-9): Low Risk  (11/19/2023)  Social Connections: Moderately Integrated (05/04/2024)  Tobacco Use: Medium Risk (05/05/2024)   SDOH Interventions:     Readmission Risk Interventions     No data to display

## 2024-05-06 NOTE — NC FL2 (Signed)
 [place]  MEDICAID FL2 LEVEL OF CARE FORM     IDENTIFICATION  Patient Name: Oscar [name] Birthdate: [birth year] Sex: male Admission Date (Current Location): 05/04/2024  [facility] and [place] Number:  Producer, television/film/video and Address:  [facility],  [address] [place], [place] [address]      Provider Number: [id number]  Attending Physician Name and Address:  Dino Antu, MD  Relative Name and Phone Number:  daughter, Dorthea Sink @ [phone]    Current Level of Care: Hospital Recommended Level of Care: Skilled Nursing Facility Prior Approval Number:    Date Approved/Denied:   PASRR Number: [id number]  Discharge Plan: SNF    Current Diagnoses: Patient Active Problem List   Diagnosis Date Noted   Closed left hip fracture (HCC) 05/04/2024   Syncope 04/01/2024   Ground-level fall 03/28/2024   Closed displaced fracture of left femoral neck (HCC) 12/12/2023   Normocytic anemia 12/12/2023   Chronic heart failure with preserved ejection fraction (HFpEF) (HCC) 12/12/2023   OAB (overactive bladder) 11/19/2023   Benign prostatic hyperplasia with nocturia 11/19/2023   Hypercholesterolemia 10/09/2021   Bladder tumor 01/04/2021   Cervical stenosis of spine 01/04/2021   GERD (gastroesophageal reflux disease) 01/04/2021   LBBB (left bundle branch block) 01/04/2021   Ascending aorta dilatation (HCC) 12/07/2020   Apneic episode 11/29/2020   Centrilobular emphysema (HCC) 11/26/2020   Presence of permanent cardiac pacemaker 11/15/2020   Hyponatremia 11/13/2020   E. coli infection    History of appendectomy    History of concussion    History of peptic ulcer    Irregular heart beat    CHB (complete heart block) (HCC) 09/28/2020   Hospital discharge follow-up 03/28/2020   CVA (cerebral vascular accident) (HCC) 03/12/2020   Acute CVA (cerebrovascular accident) (HCC) 03/12/2020   Hypothyroidism (acquired) 03/12/2020   Essential hypertension 03/12/2020   Atrial  tachycardia (HCC) 01/24/2020   Exertional dyspnea 01/18/2020   Atrial tachycardia, paroxysmal (HCC) 08/05/2019   Coronary artery disease involving native coronary artery of native heart without angina pectoris 08/05/2019   Hypothyroidism due to Hashimoto's thyroiditis 07/23/2019   Abnormal nuclear stress test    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   Cervical myelopathy (HCC) 01/27/2018   Hyperlipidemia 11/20/2017   Bunion of great toe of right foot 09/18/2016   S/P bilateral cataract extraction 09/18/2016   Encounter for general adult medical examination without abnormal findings 05/20/2016   Status post placement of implantable loop recorder 11/08/2014   Near syncope 05/16/2014   TIA (transient ischemic attack) 04/09/2014   History of bladder cancer 03/14/2014   Abnormal results of thyroid  function studies 01/19/2014   Personal history of tobacco use, presenting hazards to health 01/12/2014   Transient cerebral ischemia 01/12/2014   Mixed hyperlipidemia 11/24/2013   Adult body mass index 26.0-26.9 06/07/2013   Cancer of bladder (HCC) 02/10/2013   Serum reaction due to vaccination 09/27/2011   Gastro-esophageal reflux disease with esophagitis 09/28/2010    Orientation RESPIRATION BLADDER Height & Weight     Self, Place  Normal Incontinent, External catheter Weight: 138 lb 7.2 oz (62.8 kg) Height:     BEHAVIORAL SYMPTOMS/MOOD NEUROLOGICAL BOWEL NUTRITION STATUS      Continent Diet  AMBULATORY STATUS COMMUNICATION OF NEEDS Skin   Limited Assist Verbally Other (Comment) (surgical incision only)                       Personal Care Assistance Level of Assistance  Bathing, Dressing  Bathing Assistance: Limited assistance   Dressing Assistance: Limited assistance     Functional Limitations Info  Speech, Hearing, Sight Sight Info: Adequate   Speech Info: Adequate    SPECIAL CARE FACTORS FREQUENCY  OT (By licensed OT), PT (By licensed PT)     PT  Frequency: 5x/wk OT Frequency: 5x/wk            Contractures Contractures Info: Not present    Additional Factors Info  Code Status, Allergies Code Status Info: Full Allergies Info: lasix            Current Medications (05/06/2024):  This is the current hospital active medication list Current Facility-Administered Medications  Medication Dose Route Frequency Provider Last Rate Last Admin   0.9 %  sodium chloride  infusion   Intravenous Continuous Rashid, Farhan, MD 75 mL/hr at 05/06/24 0827 New Bag at 05/06/24 0827   docusate sodium  (COLACE) capsule 100 mg  100 mg Oral BID Cockerham, Alicia M, PA-C   100 mg at 05/06/24 9173   enoxaparin  (LOVENOX ) injection 40 mg  40 mg Subcutaneous Q24H Cockerham, Alicia M, PA-C   40 mg at 05/06/24 9173   hydrALAZINE  (APRESOLINE ) injection 10 mg  10 mg Intravenous Q6H PRN Cockerham, Alicia M, PA-C       HYDROcodone -acetaminophen  (NORCO/VICODIN) 5-325 MG per tablet 1 tablet  1 tablet Oral Q6H PRN Edna Toribio LABOR, MD   1 tablet at 05/06/24 9173   methocarbamol  (ROBAXIN ) tablet 500 mg  500 mg Oral Q6H PRN Cockerham, Alicia M, PA-C   500 mg at 05/06/24 9173   Or   methocarbamol  (ROBAXIN ) injection 500 mg  500 mg Intravenous Q6H PRN Cockerham, Alicia M, PA-C   500 mg at 05/05/24 0200   morphine  (PF) 2 MG/ML injection 0.5 mg  0.5 mg Intravenous Q4H PRN Edna Toribio LABOR, MD       ondansetron  (ZOFRAN ) injection 4 mg  4 mg Intravenous Q6H PRN Cockerham, Alicia M, PA-C       ondansetron  (ZOFRAN -ODT) disintegrating tablet 4 mg  4 mg Oral Q8H PRN Cockerham, Alicia M, PA-C       polyethylene glycol (MIRALAX  / GLYCOLAX ) packet 17 g  17 g Oral Daily PRN Cockerham, Alicia M, PA-C         Discharge Medications: Please see discharge summary for a list of discharge medications.  Relevant Imaging Results:  Relevant Lab Results:   Additional Information SS# [id number]  NORMAN ASPEN, LCSW

## 2024-05-07 ENCOUNTER — Encounter (HOSPITAL_COMMUNITY): Payer: Self-pay | Admitting: Orthopedic Surgery

## 2024-05-07 DIAGNOSIS — S72002A Fracture of unspecified part of neck of left femur, initial encounter for closed fracture: Secondary | ICD-10-CM | POA: Diagnosis not present

## 2024-05-07 LAB — BASIC METABOLIC PANEL WITH GFR
Anion gap: 9 (ref 5–15)
BUN: 20 mg/dL (ref 8–23)
CO2: 22 mmol/L (ref 22–32)
Calcium: 8.4 mg/dL — ABNORMAL LOW (ref 8.9–10.3)
Chloride: 104 mmol/L (ref 98–111)
Creatinine, Ser: 0.81 mg/dL (ref 0.61–1.24)
GFR, Estimated: >60 mL/min (ref >60)
Glucose, Bld: 118 mg/dL — ABNORMAL HIGH (ref 70–99)
Potassium: 3.7 mmol/L (ref 3.5–5.1)
Sodium: 135 mmol/L (ref 135–145)

## 2024-05-07 MED ORDER — ASPIRIN 81 MG PO TBEC: 81.0000 mg | DELAYED_RELEASE_TABLET | Freq: Two times a day (BID) | ORAL | Status: AC

## 2024-05-07 MED ORDER — HYDROCODONE-ACETAMINOPHEN 5-325 MG PO TABS: 1.0000 | ORAL_TABLET | Freq: Four times a day (QID) | ORAL | 0 refills | Status: AC | PRN

## 2024-05-07 MED ORDER — ONDANSETRON HCL 4 MG PO TABS: 4.0000 mg | ORAL_TABLET | Freq: Three times a day (TID) | ORAL | 0 refills | Status: AC | PRN

## 2024-05-07 MED ORDER — METHOCARBAMOL 500 MG PO TABS: 500.0000 mg | ORAL_TABLET | Freq: Four times a day (QID) | ORAL | Status: DC | PRN

## 2024-05-07 MED ORDER — DOCUSATE SODIUM 100 MG PO CAPS: 100.0000 mg | ORAL_CAPSULE | Freq: Two times a day (BID) | ORAL | Status: AC | PRN

## 2024-05-07 NOTE — Evaluation (Signed)
 Occupational Therapy Evaluation Patient Details Name: Oscar [name] MRN: [id number] DOB: August 04, 1931 Today's Date: 05/07/2024   History of Present Illness   89 years old male presented to the hospital from ILF [facility] after sustaining a mechanical fall. CT cervical spine with chronic findings including chronic T3-T4 compression fracture suspected. CT of the hip and pelvis showed new mildly displaced intertrochanteric left femur fracture and incomplete healing with possible loosening of the intramedullary nail. Orthopedics on board and pt s/p Left hip removal of cannulated screws with conversion to intramedullary hip nail on 05/05/24.  Recent admission after fall and L hip femoral neck fracture s/p ORIF 7/6 and recent fall 11/2023 with R hip fracture/ORIF.  Other PMH HTN, GERD, h/o bladder CA, LBBB, L eye macular degeneration, CHB s/p PPM, h/o CVA.     Clinical Impressions Pt presents with decline in function and safety with ADLs and ADL mobility with impaired strength, balance and endurance. PTA pt lived at an ILF with his wife and reports that he was Ind with ADLs/selfcare, typically goes down to the dining room, was using RW since previous hip fractures, reports still working with HHPT. Pt currently requires max A to sit EOB, CGA with UB ADLs, max -total A with LB ADLs, total A with toileting and mod A sit-stand/mobility using RW to SPT. OT will follow acutely to maximize level of function and safety     If plan is discharge home, recommend the following:   A lot of help with bathing/dressing/bathroom;A lot of help with walking and/or transfers;Assist for transportation;Help with stairs or ramp for entrance     Functional Status Assessment   Patient has had a recent decline in their functional status and demonstrates the ability to make significant improvements in function in a reasonable and predictable amount of time.     Equipment Recommendations   None recommended by OT  (defer)     Recommendations for Other Services         Precautions/Restrictions   Restrictions Weight Bearing Restrictions Per Provider Order: Yes LLE Weight Bearing Per Provider Order: Weight bearing as tolerated     Mobility Bed Mobility Overal bed mobility: Needs Assistance Bed Mobility: Supine to Sit     Supine to sit: Max assist     General bed mobility comments: Assist with LE mgt, truk elevation; able to scoot to EOB    Transfers Overall transfer level: Needs assistance Equipment used: Rolling walker (2 wheels) Transfers: Sit to/from Stand Sit to Stand: Mod assist           General transfer comment: min verbal cues for hand placement, safety      Balance Overall balance assessment: History of Falls, Needs assistance Sitting-balance support: No upper extremity supported, Feet supported Sitting balance-Leahy Scale: Good     Standing balance support: Bilateral upper extremity supported, During functional activity, Reliant on assistive device for balance Standing balance-Leahy Scale: Poor                             ADL either performed or assessed with clinical judgement   ADL Overall ADL's : Needs assistance/impaired Eating/Feeding: Independent;Sitting   Grooming: Wash/dry hands;Wash/dry face;Contact guard assist;Sitting   Upper Body Bathing: Contact guard assist;Sitting   Lower Body Bathing: Maximal assistance   Upper Body Dressing : Contact guard assist;Sitting   Lower Body Dressing: Total assistance   Toilet Transfer: Moderate assistance;Cueing for safety;Rolling walker (2 wheels);Stand-pivot   Toileting-  Clothing Manipulation and Hygiene: Total assistance       Functional mobility during ADLs: Moderate assistance;Rolling walker (2 wheels);Cueing for safety       Vision Baseline Vision/History: 1 Wears glasses Ability to See in Adequate Light: 0 Adequate Patient Visual Report: No change from baseline        Perception         Praxis         Pertinent Vitals/Pain Pain Assessment Pain Assessment: Faces Faces Pain Scale: Hurts little more Pain Location: L hip Pain Descriptors / Indicators: Aching, Discomfort Pain Intervention(s): Monitored during session, Repositioned     Extremity/Trunk Assessment Upper Extremity Assessment Upper Extremity Assessment: Generalized weakness   Lower Extremity Assessment Lower Extremity Assessment: Defer to PT evaluation       Communication Communication Communication: No apparent difficulties   Cognition Arousal: Alert Behavior During Therapy: WFL for tasks assessed/performed                                 Following commands: Intact       Cueing  General Comments   Cueing Techniques: Verbal cues;Gestural cues;Tactile cues      Exercises     Shoulder Instructions      Home Living Family/patient expects to be discharged to:: Skilled nursing facility Living Arrangements: Spouse/significant other;Children                               Additional Comments: from [place] ILF      Prior Functioning/Environment Prior Level of Function : Needs assist;Patient poor historian/Family not available             Mobility Comments: using RW since previous hip fractures, reports still working with HHPT ADLs Comments: Ind with ADLs/selfcare, typically goes down to the dining room.Using RW since previous hip fractures, reports still working with HHPT    OT Problem List: Decreased strength;Impaired balance (sitting and/or standing);Pain;Decreased activity tolerance;Decreased coordination;Decreased knowledge of use of DME or AE   OT Treatment/Interventions: Self-care/ADL training;Therapeutic exercise;Patient/family education;Balance training;Therapeutic activities;DME and/or AE instruction      OT Goals(Current goals can be found in the care plan section)   Acute Rehab OT Goals Patient Stated Goal: get  well OT Goal Formulation: With patient Time For Goal Achievement: 05/21/24 Potential to Achieve Goals: Good ADL Goals Pt Will Perform Grooming: with supervision;with set-up;sitting Pt Will Perform Upper Body Bathing: with supervision;with set-up;sitting Pt Will Perform Lower Body Bathing: with mod assist;sitting/lateral leans Pt Will Perform Upper Body Dressing: with supervision;with set-up;sitting Pt Will Transfer to Toilet: with min assist;stand pivot transfer;bedside commode;regular height toilet;grab bars Pt Will Perform Toileting - Clothing Manipulation and hygiene: with max assist;with mod assist;sitting/lateral leans;sit to/from stand   OT Frequency:  Min 2X/week    Co-evaluation              AM-PAC OT 6 Clicks Daily Activity     Outcome Measure Help from another person eating meals?: None Help from another person taking care of personal grooming?: A Little Help from another person toileting, which includes using toliet, bedpan, or urinal?: Total Help from another person bathing (including washing, rinsing, drying)?: A Lot Help from another person to put on and taking off regular upper body clothing?: A Little Help from another person to put on and taking off regular lower body clothing?: Total 6 Click Score: 14  End of Session Equipment Utilized During Treatment: Gait belt;Rolling walker (2 wheels) Nurse Communication: Mobility status  Activity Tolerance: Patient tolerated treatment well Patient left: in chair;with call bell/phone within reach;with chair alarm set  OT Visit Diagnosis: Unsteadiness on feet (R26.81);Other abnormalities of gait and mobility (R26.89);History of falling (Z91.81);Pain;Muscle weakness (generalized) (M62.81) Pain - Right/Left: Left Pain - part of body: Hip;Leg                Time: 8990-8961 OT Time Calculation (min): 29 min Charges:  OT General Charges $OT Visit: 1 Visit OT Evaluation $OT Eval Moderate Complexity: 1 Mod OT  Treatments $Therapeutic Activity: 8-22 mins    Jacques Karna Loose 05/07/2024, 12:30 PM

## 2024-05-07 NOTE — Progress Notes (Signed)
 Report called to [facility], PTAR transferring to facility, belongings packed, VS stable, packet intact with AVS and scripts.

## 2024-05-07 NOTE — Plan of Care (Signed)
  Problem: Education: Goal: Knowledge of General Education information will improve Description: Including pain rating scale, medication(s)/side effects and non-pharmacologic comfort measures 05/07/2024 1525 by Alaina Dozier PARAS, RN Outcome: Adequate for Discharge 05/07/2024 0801 by Alaina Dozier PARAS, RN Outcome: Not Progressing   Problem: Health Behavior/Discharge Planning: Goal: Ability to manage health-related needs will improve 05/07/2024 1525 by Alaina Dozier PARAS, RN Outcome: Adequate for Discharge 05/07/2024 0801 by Alaina Dozier PARAS, RN Outcome: Not Progressing   Problem: Clinical Measurements: Goal: Ability to maintain clinical measurements within normal limits will improve 05/07/2024 1525 by Alaina Dozier PARAS, RN Outcome: Adequate for Discharge 05/07/2024 0801 by Alaina Dozier PARAS, RN Outcome: Progressing Goal: Will remain free from infection 05/07/2024 1525 by Alaina Dozier PARAS, RN Outcome: Adequate for Discharge 05/07/2024 0801 by Alaina Dozier PARAS, RN Outcome: Progressing Goal: Diagnostic test results will improve 05/07/2024 1525 by Alaina Dozier PARAS, RN Outcome: Adequate for Discharge 05/07/2024 0801 by Alaina Dozier PARAS, RN Outcome: Progressing Goal: Respiratory complications will improve Outcome: Adequate for Discharge Goal: Cardiovascular complication will be avoided Outcome: Adequate for Discharge   Problem: Activity: Goal: Risk for activity intolerance will decrease Outcome: Adequate for Discharge   Problem: Nutrition: Goal: Adequate nutrition will be maintained Outcome: Adequate for Discharge   Problem: Coping: Goal: Level of anxiety will decrease Outcome: Adequate for Discharge   Problem: Elimination: Goal: Will not experience complications related to bowel motility Outcome: Adequate for Discharge Goal: Will not experience complications related to urinary retention Outcome: Adequate for Discharge   Problem: Pain  Managment: Goal: General experience of comfort will improve and/or be controlled Outcome: Adequate for Discharge   Problem: Safety: Goal: Ability to remain free from injury will improve Outcome: Adequate for Discharge   Problem: Skin Integrity: Goal: Risk for impaired skin integrity will decrease Outcome: Adequate for Discharge

## 2024-05-07 NOTE — Discharge Summary (Signed)
 Physician Discharge Summary   Patient: Oscar [name] MRN: [id number] DOB: [birth year]  Admit date:     05/04/2024  Discharge date: 05/07/24  Discharge Physician: Deliliah Room   PCP: Frann Mabel Mt, DO   Recommendations at discharge:    Follow up with your PCP in one week. Follow up out patient with orthopedics in 2 weeks. Continue taking meds as prescribed   Discharge Diagnoses: Principal Problem:   Closed left hip fracture [facility])   Hospital Course:  Left femur mildly displaced intertrochanteric fracture,POA:  s/p Left hip removal of cannulated screws with conversion to intramedullary hip nail. POD#2 Recent history of left hip fracture with screw plate fixation, on 7/6 Orthopedics on board. F/u in 2 weeks with Dr Edna at [facility].  Prn analgesics, antiemetics PT OT eval SNF on discharge   Hyponatremia: likely hypovolemia, resolved. Dced IVF on 8/15.Encouraged oral hydration.   Hyperlipidemia. Continue Statin    Hypertension.  No acute issues   History of CAD.  No active issues.  Continue statins   Likely Alzheimer's dementia: Intermittent episodes of disorientation and confusion. No focal abnormalities.  No evidence of agitation or delirium at this time Monitor neuro status closely. I personally reviewed CT head which didn't show any acute abnormalities.   Disposition: [facility]      Consultants:Orthopedics Procedures performed:  Left hip removal of cannulated screws with conversion to intramedullary hip nail   Disposition: Skilled nursing facility Diet recommendation:  Regular diet DISCHARGE MEDICATION: Allergies as of 05/07/2024       Reactions   Lasix  [furosemide ] Other (See Comments)   Hypotension secondary to dehydration        Medication List     TAKE these medications    acetaminophen  500 MG tablet Commonly known as: TYLENOL  Take 2 tablets (1,000 mg total) by mouth every 8 (eight) hours. What changed:   when to take this reasons to take this   aspirin  EC 81 MG tablet Take 1 tablet (81 mg total) by mouth 2 (two) times daily for 28 days. Swallow whole.   atorvastatin  40 MG tablet Commonly known as: LIPITOR Take 1 tablet (40 mg total) by mouth daily.   docusate sodium  100 MG capsule Commonly known as: COLACE Take 1 capsule (100 mg total) by mouth 2 (two) times daily as needed for moderate constipation.   ferrous sulfate  325 (65 FE) MG EC tablet Take 1 tablet (325 mg total) by mouth daily with breakfast.   finasteride  5 MG tablet Commonly known as: Proscar  Take 1 tablet (5 mg total) by mouth daily.   HYDROcodone -acetaminophen  5-325 MG tablet Commonly known as: NORCO/VICODIN Take 1 tablet by mouth every 6 (six) hours as needed for up to 5 days for moderate pain (pain score 4-6).   methocarbamol  500 MG tablet Commonly known as: ROBAXIN  Take 1 tablet (500 mg total) by mouth every 6 (six) hours as needed for muscle spasms.   midodrine  5 MG tablet Commonly known as: PROAMATINE  Take 1 tablet (5 mg total) by mouth 2 (two) times daily with a meal.   multivitamin with minerals Tabs tablet Take 1 tablet by mouth daily.   ondansetron  4 MG tablet Commonly known as: Zofran  Take 1 tablet (4 mg total) by mouth every 8 (eight) hours as needed for up to 14 days for nausea or vomiting.   pantoprazole  40 MG tablet Commonly known as: PROTONIX  Take 1 tablet (40 mg total) by mouth daily.   tamsulosin  0.4 MG Caps capsule  Commonly known as: FLOMAX  Take 0.4 mg by mouth daily.   Vitamin D -3 125 MCG (5000 UT) Tabs Take 5,000 Units by mouth daily.        Follow-up Information     Edna Toribio LABOR, MD Follow up in 2 week(s).   Specialty: Orthopedic Surgery Contact information: [address] [place] [address] [phone]         Frann Mabel Mt, DO. Schedule an appointment as soon as possible for a visit in 1 week(s).   Specialty: Family  Medicine Contact information: [address] [place] [address] [phone]                Discharge Exam: [name]   05/05/24 9366  Weight: 62.8 kg   General exam: Alert and oriented x 2 Respiratory system: Clear to auscultation. Respiratory effort normal. Cardiovascular system: S1 & S2 heard, RRR. No JVD, murmurs, rubs, gallops or clicks. No pedal edema. Gastrointestinal system: Abdomen is nondistended, soft and nontender. No organomegaly or masses felt. Normal bowel sounds heard. Central nervous system: Alert and oriented x 2. No focal neurological deficits. Extremities: s/p left hip revision, dressing in place, no discharge Skin: No rashes, lesions or ulcers Psychiatry: alert and awake  Condition at discharge: good  The results of significant diagnostics from this hospitalization (including imaging, microbiology, ancillary and laboratory) are listed below for reference.   Imaging Studies: DG FEMUR PORT MIN 2 VIEWS LEFT Result Date: 05/06/2024 CLINICAL DATA:  Status post left femoral ORIF EXAM: LEFT FEMUR PORTABLE 2 VIEWS COMPARISON:  Intraoperative films from the previous day. FINDINGS: Medullary rod is noted within the proximal left femur. Compression screws are seen extending into the femoral head. Distal fixation screw in the femur is noted as well. Fracture fragments are in near anatomic alignment. IMPRESSION: Status post ORIF proximal left femoral fracture. Electronically Signed   By: Oneil Devonshire M.D.   On: 05/06/2024 01:04   DG FEMUR MIN 2 VIEWS LEFT Result Date: 05/05/2024 CLINICAL DATA:  Elective surgery. EXAM: LEFT FEMUR 2 VIEWS COMPARISON:  Hip radiograph earlier today FINDINGS: Thirteen fluoroscopic spot views of the left femur submitted from the operating room. The screws traversing the femoral neck have been removed. Interval femoral intramedullary nail with distal locking and trans trochanteric screw fixation traversing proximal femur  fracture. Fluoroscopy time 1 minutes 7 seconds. Dose 9.2181 mGy. IMPRESSION: Intraoperative fluoroscopy during ORIF of proximal femur fracture. Electronically Signed   By: Andrea Gasman M.D.   On: 05/05/2024 20:45   DG C-Arm 1-60 Min-No Report Result Date: 05/05/2024 Fluoroscopy was utilized by the requesting physician.  No radiographic interpretation.   DG C-Arm 1-60 Min-No Report Result Date: 05/05/2024 Fluoroscopy was utilized by the requesting physician.  No radiographic interpretation.   DG HIP UNILAT W OR W/O PELVIS 2-3 VIEWS LEFT Result Date: 05/05/2024 CLINICAL DATA:  Hip fracture. EXAM: DG HIP (WITH OR WITHOUT PELVIS) 2-3V LEFT COMPARISON:  Pelvic CT yesterday FINDINGS: Three screws traverse the left femoral neck. Mildly displaced intertrochanteric fracture is grossly stable from CT yesterday allowing for differences in modality. No new fracture. Right femoral intramedullary nail with trans trochanteric and distal locking screw fixation. Pubic rami and bony pelvis are intact. The bones are subjectively under mineralized. IMPRESSION: Three screws traverse the left femoral neck. Mildly displaced intertrochanteric fracture is grossly stable from CT yesterday allowing for differences in modality. Electronically Signed   By: Andrea Gasman M.D.   On: 05/05/2024 15:37  CT PELVIS WO CONTRAST Result Date: 05/04/2024 CLINICAL DATA:  Left hip pain after falling today. Concern for fracture. EXAM: CT PELVIS WITHOUT CONTRAST TECHNIQUE: Multidetector CT imaging of the pelvis was performed following the standard protocol without intravenous contrast. RADIATION DOSE REDUCTION: This exam was performed according to the departmental dose-optimization program which includes automated exposure control, adjustment of the mA and/or kV according to patient size and/or use of iterative reconstruction technique. COMPARISON:  None recent. Left femur radiographs 04/01/2024. CT pelvis 09/19/2021. Left hip CT  03/27/2024. FINDINGS: Urinary Tract: The visualized distal ureters and bladder appear unremarkable. Bowel: No bowel wall thickening, distention or surrounding inflammation identified within the pelvis. Mild diverticular changes within the distal colon. Vascular/Lymphatic: No enlarged pelvic lymph nodes identified. Aortoiliac atherosclerosis without evidence of aneurysm. Reproductive: Mild enlargement of the prostate gland. Other: No pelvic ascites or pneumoperitoneum. Musculoskeletal: Since the prior pelvic CT, the patient has undergone right femoral compression screw plate fixation (12/12/2023) and left femoral neck pinning (03/28/2024). There is a new mildly displaced intertrochanteric left femur fracture. Deformity of the left femoral neck from previous subcapital fracture is grossly unchanged. The right femoral hardware is incompletely visualized. There is possible loosening of the intramedullary nail. There is incomplete healing of the right intertrochanteric femur fracture with sclerotic margins anteriorly. No evidence of dislocation or acute pelvic fracture. There is a new severe biconcave compression fracture at L5 with associated osseous retropulsion. This results in approximately 75% loss of vertebral body height. This fracture is probably present on the 03/28/2024 radiographs. IMPRESSION: 1. New mildly displaced intertrochanteric left femur fracture. 2. Incomplete healing of the right intertrochanteric femur fracture with possible loosening of the intramedullary nail. 3. New severe biconcave compression fracture at L5 with associated osseous retropulsion since previous pelvic CT in 2022. This fracture is probably present on the 03/28/2024 radiographs. Correlate clinically. 4. No evidence of dislocation or acute pelvic fracture. 5.  Aortic Atherosclerosis (ICD10-I70.0). Electronically Signed   By: Elsie Perone M.D.   On: 05/04/2024 12:26   DG Chest 1 View Result Date: 05/04/2024 CLINICAL DATA:  Fall  from couch this morning.  Left hip pain. EXAM: CHEST  1 VIEW COMPARISON:  Radiographs 03/27/2024 and 12/11/2023.  CT 12/15/2020. FINDINGS: 1124 hours. Left subclavian pacemaker leads appear unchanged, projecting over the right atrium and right ventricle. The heart size and mediastinal contours are stable with aortic atherosclerosis. Mild atelectasis at both lung bases. No edema, confluent airspace disease, pleural effusion or pneumothorax. No acute fractures are identified within the chest. IMPRESSION: Mild bibasilar atelectasis. No evidence of acute chest injury. Electronically Signed   By: Elsie Perone M.D.   On: 05/04/2024 12:09   CT CERVICAL SPINE WO CONTRAST Result Date: 05/04/2024 CLINICAL DATA:  89 year old male status post fall from couch. Pain. EXAM: CT CERVICAL SPINE WITHOUT CONTRAST TECHNIQUE: Multidetector CT imaging of the cervical spine was performed without intravenous contrast. Multiplanar CT image reconstructions were also generated. RADIATION DOSE REDUCTION: This exam was performed according to the departmental dose-optimization program which includes automated exposure control, adjustment of the mA and/or kV according to patient size and/or use of iterative reconstruction technique. COMPARISON:  Head CT today.  Cervical spine CT 03/27/2024. FINDINGS: Alignment: Stable. Maintained cervicothoracic junction alignment and bilateral posterior element alignment. Skull base and vertebrae: Stable bone mineralization. Visualized skull base is intact. No atlanto-occipital dissociation. Occipital-C1 and anterior C1-C2 joint degeneration. Those levels appear intact and aligned. Postoperative changes detailed below. No acute osseous abnormality identified. Soft tissues and spinal canal:  No prevertebral fluid or swelling. No visible canal hematoma. Negative for age visible noncontrast neck soft tissues. Disc levels: C4-C5 ACDF hardware with solid ankylosis or arthrodesis C3 through C6. Stable adjacent  segment disease at C6-C7 with vacuum disc. Stable CT appearance of cervical spine degeneration since last month. Upper chest: Visible upper thoracic levels appear stable including subtle T3 and partially visible T4 superior endplate compression. Stable upper lobe lung scarring. Left chest cardiac pacemaker device redemonstrated. Calcified aortic atherosclerosis. Fluid containing but nondilated proximal thoracic esophagus. IMPRESSION: 1. No acute traumatic injury identified in the cervical spine. 2. 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 Stable advanced adjacent segment disease at C6-C7. 3. Mild chronic T3 and T4 upper thoracic compression fractures suspected. Aortic Atherosclerosis (ICD10-I70.0). Electronically Signed   By: VEAR Hurst M.D.   On: 05/04/2024 09:46   CT HEAD WO CONTRAST Result Date: 05/04/2024 CLINICAL DATA:  89 year old male status post fall from couch. Pain. EXAM: CT HEAD WITHOUT CONTRAST TECHNIQUE: Contiguous axial images were obtained from the base of the skull through the vertex without intravenous contrast. RADIATION DOSE REDUCTION: This exam was performed according to the departmental dose-optimization program which includes automated exposure control, adjustment of the mA and/or kV according to patient size and/or use of iterative reconstruction technique. COMPARISON:  Brain MRI 09/14/2020.  Head CT 03/31/2024. FINDINGS: Brain: Cerebral volume does not appear significantly changed since the 2021 MRI. No midline shift, ventriculomegaly, mass effect, evidence of mass lesion, intracranial hemorrhage or evidence of cortically based acute infarction. Patchy mild to moderate chronic periventricular white matter hypodensity with some deep white matter capsule involvement on the left. Stable gray-white matter differentiation throughout the brain. Vascular: Calcified atherosclerosis at the skull base. No suspicious intracranial vascular hyperdensity. Skull: Stable and  intact. Sinuses/Orbits: Visualized paranasal sinuses and mastoids are clear. Other: No acute orbit or scalp soft tissue injury identified. IMPRESSION: 1. No acute intracranial abnormality or acute traumatic injury identified. 2. Chronic white matter disease stable since a 2021 MRI. Electronically Signed   By: VEAR Hurst M.D.   On: 05/04/2024 09:42    Microbiology: Results for orders placed or performed during the hospital encounter of 05/04/24  Surgical PCR screen     Status: None   Collection Time: 05/05/24 12:44 PM   Specimen: Nasal Mucosa; Nasal Swab  Result Value Ref Range Status   MRSA, PCR NEGATIVE NEGATIVE Final   Staphylococcus aureus NEGATIVE NEGATIVE Final    Comment: (NOTE) The Xpert SA Assay (FDA approved for NASAL specimens in patients 64 years of age and older), is one component of a comprehensive surveillance program. It is not intended to diagnose infection nor to guide or monitor treatment. Performed at [facility], [address], [place], [place] [address]     Labs: CBC: Recent Labs  Lab 05/04/24 [id number] 05/05/24 0338 05/06/24 0606  WBC 5.8 8.8 7.0  NEUTROABS 4.2  --   --   HGB 12.4* 11.7* 9.0*  HCT 38.6* 36.8* 29.8*  MCV 90.8 91.5 94.0  PLT 196 179 135*   Basic Metabolic Panel: Recent Labs  Lab 05/04/24 0837 05/05/24 0338 05/06/24 0338 05/07/24 0324  NA 136 136 130* 135  K 4.0 3.7 4.2 3.7  CL 103 104 101 104  CO2 24 21* 18* 22  GLUCOSE 106* 119* 143* 118*  BUN 18 16 15 20   CREATININE 0.82 0.76 0.80 0.81  CALCIUM  9.6 8.8* 8.1* 8.4*  MG  --  2.0  --   --  Liver Function Tests: Recent Labs  Lab 05/06/24 0338  AST 20  ALT 11  ALKPHOS 76  BILITOT 0.7  PROT 5.6*  ALBUMIN  3.1*   CBG: Recent Labs  Lab 05/05/24 2034  GLUCAP 118*    Discharge time spent: 43 minutes.  Signed: Deliliah Room, MD [facility] 05/07/2024

## 2024-05-07 NOTE — TOC Progression Note (Addendum)
 Transition of Care [facility]) - Progression Note    Patient Details  Name: Oscar [name] MRN: [id number] Date of Birth: [birth year]  Transition of [facility]) CM/SW Contact  Sonda Manuella Quill, RN Phone Number: 05/07/2024, 12:00 PM  Clinical Narrative:    Per Dr Dino pt medically ready for d/c; sent out for bed offers; pt previously requested [facility]; Levon at facility notified and requested ins auth be started for facility; she will contact pt/family; submitted for ins auth thru [employer]; received Auth ID # [id number]; awaiting ins auth.  -[id number]-  ins auth received; Ins auth ID pending, Auth ID # [id number]; start date 05/07/24, end date 05/11/24; spoke w Dortha, Rep for [employer]; she confirms auth approved, and can take 24-48 hours for Plan Auth ID to generate; Nikki at [employer]; she will call back with room # and call report #; transport by [facility]; Dr Dino and notified; awaiting D/C summary before calling [facility]  -1301- return call from [facility]; she gave RM # [phone]; call report # [phone]; spoke w/ pt in room and dtr Dorthea [phone]) on speaker phone; they agree to d/c plan Expected Discharge Plan: Skilled Nursing Facility Barriers to Discharge: Continued Medical Work up, English as a second language teacher, SNF Pending bed offer               Expected Discharge Plan and Services In-house Referral: Clinical Social Work   Post Acute Care Choice: Skilled Nursing Facility Living arrangements for the past 2 months: Independent Living Facility ([facility] [place])                 DME Arranged: N/A DME Agency: NA                   Social Drivers of Health (SDOH) Interventions SDOH Screenings   Food Insecurity: No Food Insecurity (05/04/2024)  Housing: Low Risk  (05/04/2024)  Transportation Needs: No Transportation Needs (05/04/2024)  Utilities: Not At Risk (05/04/2024)  Depression (PHQ2-9): Low Risk  (11/19/2023)  Social Connections: Moderately Integrated (05/04/2024)  Tobacco Use:  Medium Risk (05/05/2024)    Readmission Risk Interventions     No data to display

## 2024-05-07 NOTE — Plan of Care (Signed)

## 2024-05-07 NOTE — Progress Notes (Signed)
     Subjective:  Patient reports pain as mild.  Lying comfortably in bed this morning.  He is in good spirits, looking forward to breakfast.  Denies distal numbness and tingling.  Moved to recliner with PT yesterday, has not ambulated just yet.    Yesterday's total administered Morphine  Milligram Equivalents: 20   Objective:   VITALS:   Vitals:   05/06/24 0935 05/06/24 1414 05/06/24 1938 05/07/24 0634  BP: (!) 89/62 120/65 114/67 123/74  Pulse: 75 79 86 79  Resp: 16 15 18 16   Temp:  98.2 F (36.8 C) 98.8 F (37.1 C) 98.2 F (36.8 C)  TempSrc:      SpO2: 100% 100% 100% 100%  Weight:        Resting in bed comfortably, alert, in NAD Neurologically intact Sensation intact distally Intact pulses distally Dorsiflexion/Plantar flexion intact Incision: dressing C/D/I Wiggles toes appropriately    Lab Results  Component Value Date   WBC 7.0 05/06/2024   HGB 9.0 (L) 05/06/2024   HCT 29.8 (L) 05/06/2024   MCV 94.0 05/06/2024   PLT 135 (L) 05/06/2024   BMET    Component Value Date/Time   NA 135 05/07/2024 0324   NA 138 05/14/2021 1135   K 3.7 05/07/2024 0324   CL 104 05/07/2024 0324   CO2 22 05/07/2024 0324   GLUCOSE 118 (H) 05/07/2024 0324   BUN 20 05/07/2024 0324   BUN 18 05/14/2021 1135   CREATININE 0.81 05/07/2024 0324   CALCIUM  8.4 (L) 05/07/2024 0324   EGFR 68 05/14/2021 1135   GFRNONAA >60 05/07/2024 0324      Xray: Intertrochanteric nail fixation in good alignment no adverse features  Assessment/Plan: 2 Days Post-Op   Principal Problem:   Closed left hip fracture (HCC)  Status post left hip revision from cannulated screws to hip nail for subtrochanteric femur fracture 05/05/24  Post op recs: WB: WBAT RLE Abx: ancef  x23 hours post op Imaging: PACU xrays Dressing: keep intact until follow up, change PRN if soiled or saturated. DVT prophylaxis: lovenox  starting POD1 x4 weeks Follow up: 2 weeks after surgery for a wound check with Dr. Edna  at [facility].  Address: [address], [place], [place] [address]  Office Phone: [phone]    Bernarda CHRISTELLA Mclean 05/07/2024, 6:55 AM     Contact information:   Weekdays 7am-5pm epic message Dr. Edna, or call office for patient follow up: [phone] After hours and holidays please check [email or web address] for group call information for [facility]

## 2024-05-07 NOTE — Progress Notes (Signed)
 PROGRESS NOTE    Oscar [name]  FMW:[id number] DOB: 1931/05/17 DOA: 05/04/2024 PCP: Frann Mabel Mt, DO   Brief Narrative:   89 years old male with past medical history of COPD, CVA, coronary artery disease, hypertension, hyperlipidemia with recent hip surgery approximately 5 weeks back for hip fracture treated with screws presented to the hospital from assisted living facility after sustaining a mechanical fall. CT cervical spine with chronic findings including chronic T3-T4 compression fracture suspected. CT of the hip and pelvis showed new mildly displaced intertrochanteric left femur fracture and incomplete healing with possible loosening of the intramedullary nail. Orthopedics on board with plans for surgical intervention. Needs SNF on discharge.  Assessment & Plan:  Principal Problem:   Closed left hip fracture (HCC)    Left femur mildly displaced intertrochanteric fracture,POA:  s/p Left hip removal of cannulated screws with conversion to intramedullary hip nail. POD#1 Recent history of left hip fracture with screw plate fixation, on 7/6 Orthopedics on board. Prn analgesics, antiemetics PT OT eval SNF on discharge  Hyponatremia: likely hypovolemia, resolved. Dced IVF on 8/15.Encouraged oral hydration.   Hyperlipidemia. Continue Statin    Hypertension.  Prn IV hydralazine  for SBP > 160 mmHg.   History of CAD.  No active issues.  Continue statins  Likely Alzheimer's dementia: Intermittent episodes of disorientation and confusion. No focal abnormalities.  No evidence of agitation or delirium at this time Monitor neuro status closely. I personally reviewed CT head which didn't show any acute abnormalities.  Disposition: Pending SNF. Medically ready    DVT prophylaxis: enoxaparin  (LOVENOX ) injection 40 mg Start: 05/06/24 0900 Heparin  5000 Rico Q8H     Code Status: Full Code Family Communication:  None at the bedside Status is: Inpatient Remains inpatient  appropriate because: Left hip fracture, underwent surgery, pending SNF    Subjective:  Feels fine, denies any active complaints. He had an uneventful night. Pending SNF placement.  Examination:  General exam: Alert and oriented x 2 Respiratory system: Clear to auscultation. Respiratory effort normal. Cardiovascular system: S1 & S2 heard, RRR. No JVD, murmurs, rubs, gallops or clicks. No pedal edema. Gastrointestinal system: Abdomen is nondistended, soft and nontender. No organomegaly or masses felt. Normal bowel sounds heard. Central nervous system: Alert and oriented x 2. No focal neurological deficits. Extremities: s/p left hip revision, dressing in place, no discharge Skin: No rashes, lesions or ulcers Psychiatry: alert and awake       Diet Orders (From admission, onward)     Start     Ordered   05/05/24 2216  Diet regular Room service appropriate? Yes; Fluid consistency: Thin  Diet effective now       Question Answer Comment  Room service appropriate? Yes   Fluid consistency: Thin      05/05/24 2215            Objective: Vitals:   05/06/24 0935 05/06/24 1414 05/06/24 1938 05/07/24 0634  BP: (!) 89/62 120/65 114/67 123/74  Pulse: 75 79 86 79  Resp: 16 15 18 16   Temp:  98.2 F (36.8 C) 98.8 F (37.1 C) 98.2 F (36.8 C)  TempSrc:      SpO2: 100% 100% 100% 100%  Weight:        Intake/Output Summary (Last 24 hours) at 05/07/2024 1025 Last data filed at 05/07/2024 0927 Gross per 24 hour  Intake 520 ml  Output 950 ml  Net -430 ml   Filed Weights   05/05/24 0633  Weight: 62.8 kg  Scheduled Meds:  docusate sodium   100 mg Oral BID   enoxaparin  (LOVENOX ) injection  40 mg Subcutaneous Q24H   Continuous Infusions:    Nutritional status     Body mass index is 21.05 kg/m.  Data Reviewed:   CBC: Recent Labs  Lab 05/04/24 0837 05/05/24 0338 05/06/24 0606  WBC 5.8 8.8 7.0  NEUTROABS 4.2  --   --   HGB 12.4* 11.7* 9.0*  HCT 38.6* 36.8* 29.8*   MCV 90.8 91.5 94.0  PLT 196 179 135*   Basic Metabolic Panel: Recent Labs  Lab 05/04/24 0837 05/05/24 0338 05/06/24 0338 05/07/24 0324  NA 136 136 130* 135  K 4.0 3.7 4.2 3.7  CL 103 104 101 104  CO2 24 21* 18* 22  GLUCOSE 106* 119* 143* 118*  BUN 18 16 15 20   CREATININE 0.82 0.76 0.80 0.81  CALCIUM  9.6 8.8* 8.1* 8.4*  MG  --  2.0  --   --    GFR: Estimated Creatinine Clearance: 52.8 mL/min (by C-G formula based on SCr of 0.81 mg/dL). Liver Function Tests: Recent Labs  Lab 05/06/24 0338  AST 20  ALT 11  ALKPHOS 76  BILITOT 0.7  PROT 5.6*  ALBUMIN  3.1*   No results for input(s): LIPASE, AMYLASE in the last 168 hours. No results for input(s): AMMONIA in the last 168 hours. Coagulation Profile: No results for input(s): INR, PROTIME in the last 168 hours. Cardiac Enzymes: No results for input(s): CKTOTAL, CKMB, CKMBINDEX, TROPONINI in the last 168 hours. BNP (last 3 results) No results for input(s): PROBNP in the last 8760 hours. HbA1C: No results for input(s): HGBA1C in the last 72 hours. CBG: Recent Labs  Lab 05/05/24 2034  GLUCAP 118*   Lipid Profile: No results for input(s): CHOL, HDL, LDLCALC, TRIG, CHOLHDL, LDLDIRECT in the last 72 hours. Thyroid  Function Tests: No results for input(s): TSH, T4TOTAL, FREET4, T3FREE, THYROIDAB in the last 72 hours. Anemia Panel: No results for input(s): VITAMINB12, FOLATE, FERRITIN, TIBC, IRON, RETICCTPCT in the last 72 hours. Sepsis Labs: No results for input(s): PROCALCITON, LATICACIDVEN in the last 168 hours.  Recent Results (from the past 240 hours)  Surgical PCR screen     Status: None   Collection Time: 05/05/24 12:44 PM   Specimen: Nasal Mucosa; Nasal Swab  Result Value Ref Range Status   MRSA, PCR NEGATIVE NEGATIVE Final   Staphylococcus aureus NEGATIVE NEGATIVE Final    Comment: (NOTE) The Xpert SA Assay (FDA approved for NASAL specimens in  patients 67 years of age and older), is one component of a comprehensive surveillance program. It is not intended to diagnose infection nor to guide or monitor treatment. Performed at [facility], [address], [place], [place] [address]          Radiology Studies: DG FEMUR PORT MIN 2 VIEWS LEFT Result Date: 05/06/2024 CLINICAL DATA:  Status post left femoral ORIF EXAM: LEFT FEMUR PORTABLE 2 VIEWS COMPARISON:  Intraoperative films from the previous day. FINDINGS: Medullary rod is noted within the proximal left femur. Compression screws are seen extending into the femoral head. Distal fixation screw in the femur is noted as well. Fracture fragments are in near anatomic alignment. IMPRESSION: Status post ORIF proximal left femoral fracture. Electronically Signed   By: Oneil Devonshire M.D.   On: 05/06/2024 01:04   DG FEMUR MIN 2 VIEWS LEFT Result Date: 05/05/2024 CLINICAL DATA:  Elective surgery. EXAM: LEFT FEMUR 2 VIEWS COMPARISON:  Hip radiograph earlier today  FINDINGS: Thirteen fluoroscopic spot views of the left femur submitted from the operating room. The screws traversing the femoral neck have been removed. Interval femoral intramedullary nail with distal locking and trans trochanteric screw fixation traversing proximal femur fracture. Fluoroscopy time 1 minutes 7 seconds. Dose 9.2181 mGy. IMPRESSION: Intraoperative fluoroscopy during ORIF of proximal femur fracture. Electronically Signed   By: Andrea Gasman M.D.   On: 05/05/2024 20:45   DG C-Arm 1-60 Min-No Report Result Date: 05/05/2024 Fluoroscopy was utilized by the requesting physician.  No radiographic interpretation.   DG C-Arm 1-60 Min-No Report Result Date: 05/05/2024 Fluoroscopy was utilized by the requesting physician.  No radiographic interpretation.   DG HIP UNILAT W OR W/O PELVIS 2-3 VIEWS LEFT Result Date: 05/05/2024 CLINICAL DATA:  Hip fracture. EXAM: DG HIP (WITH OR WITHOUT PELVIS) 2-3V LEFT  COMPARISON:  Pelvic CT yesterday FINDINGS: Three screws traverse the left femoral neck. Mildly displaced intertrochanteric fracture is grossly stable from CT yesterday allowing for differences in modality. No new fracture. Right femoral intramedullary nail with trans trochanteric and distal locking screw fixation. Pubic rami and bony pelvis are intact. The bones are subjectively under mineralized. IMPRESSION: Three screws traverse the left femoral neck. Mildly displaced intertrochanteric fracture is grossly stable from CT yesterday allowing for differences in modality. Electronically Signed   By: Andrea Gasman M.D.   On: 05/05/2024 15:37       LOS: 3 days   Time spent= 40 mins    Deliliah Room, MD [facility]  If 7PM-7AM, please contact night-coverage  05/07/2024

## 2024-05-07 NOTE — TOC Transition Note (Addendum)
 Transition of Care [facility]) - Discharge Note   Patient Details  Name: Oscar [name] MRN: [id number] Date of Birth: 11-21-1930  [facility] CM/SW Contact:  Sonda Manuella Quill, RN Phone Number: 05/07/2024, 1:27 PM   Clinical Narrative:    D/C orders received; he will d/c to [facility] RM # [id number]; call report # [phone]; Dozier, RN notified; transport by [facility]; pt and dtr agree to d/c plan; awaiting D/C summary before calling transport.  -1341- D/C summary received; SNF transfer report and D/C summary sent via SNF hub; Nikki at facility notified; PTAR called at 1342; spoke w/ Vina; no TOC needs.  Final next level of care: Skilled Nursing Facility Barriers to Discharge: No Barriers Identified   Patient Goals and CMS Choice Patient states their goals for this hospitalization and ongoing recovery are:: return to [place] is patient goal          Discharge Placement              Patient chooses bed at: [facility] Patient to be transferred to facility by: [facility] Name of family member notified: Dorthea (dtr) [phone] Patient and family notified of of transfer: 05/07/24  Discharge Plan and Services Additional resources added to the After Visit Summary for   In-house Referral: Clinical Social Work   Post Acute Care Choice: Skilled Nursing Facility          DME Arranged: N/A DME Agency: NA                  Social Drivers of Health (SDOH) Interventions SDOH Screenings   Food Insecurity: No Food Insecurity (05/04/2024)  Housing: Low Risk  (05/04/2024)  Transportation Needs: No Transportation Needs (05/04/2024)  Utilities: Not At Risk (05/04/2024)  Depression (PHQ2-9): Low Risk  (11/19/2023)  Social Connections: Moderately Integrated (05/04/2024)  Tobacco Use: Medium Risk (05/05/2024)     Readmission Risk Interventions     No data to display

## 2024-05-10 NOTE — Anesthesia Postprocedure Evaluation (Signed)
 Anesthesia Post Note  Patient: Oscar [name]  Procedure(s) Performed: FIXATION, FRACTURE, INTERTROCHANTERIC, WITH INTRAMEDULLARY ROD (Left)     Patient location during evaluation: PACU Anesthesia Type: General Level of consciousness: patient cooperative Pain management: pain level controlled Vital Signs Assessment: post-procedure vital signs reviewed and stable Respiratory status: spontaneous breathing, nonlabored ventilation and respiratory function stable Cardiovascular status: blood pressure returned to baseline and stable Postop Assessment: no apparent nausea or vomiting Anesthetic complications: no   No notable events documented.                 Marlen Koman

## 2024-05-31 ENCOUNTER — Encounter: Payer: Medicare Other | Admitting: Family Medicine

## 2024-06-01 ENCOUNTER — Ambulatory Visit: Admitting: Family Medicine

## 2024-06-01 ENCOUNTER — Telehealth: Payer: Self-pay

## 2024-06-01 NOTE — Transitions of Care (Post Inpatient/ED Visit) (Signed)
 06/01/2024  Name: Oscar [name] MRN: [id number] DOB: [birth year]  Today's TOC FU Call Status: Today's TOC FU Call Status:: Successful TOC FU Call Completed TOC FU Call Complete Date: 06/01/24 Patient's Name and Date of Birth confirmed.  Transition Care Management Follow-up Telephone Call Date of Discharge: 05/31/24 Discharge Facility: Other ([facility]) Name of Other ([facility]) Discharge Facility: [facility] Type of Discharge: Inpatient Admission Primary Inpatient Discharge Diagnosis:: cognitive deficit How have you been since you were released from the hospital?: Same Any questions or concerns?: No  Items Reviewed: Did you receive and understand the discharge instructions provided?: Yes Medications obtained,verified, and reconciled?: Yes (Medications Reviewed) Any new allergies since your discharge?: No Dietary orders reviewed?: Yes Do you have support at home?: Yes Name of Support/Comfort Primary Source: caretaker  Medications Reviewed Today: Medications Reviewed Today     Reviewed by Emmitt Pan, LPN (Licensed Practical Nurse) on 06/01/24 at 1000  Med List Status: <None>   Medication Order Taking? Sig Documenting Provider Last Dose Status Informant  acetaminophen  (TYLENOL ) 500 MG tablet [id number] Yes Take 2 tablets (1,000 mg total) by mouth every 8 (eight) hours.  Patient taking differently: Take 1,000 mg by mouth every 8 (eight) hours as needed for mild pain (pain score 1-3) or moderate pain (pain score 4-6).   Willette Adriana LABOR, MD  Active Pharmacy Records, Care Giver  aspirin  EC 81 MG tablet [id number] Yes Take 1 tablet (81 mg total) by mouth 2 (two) times daily for 28 days. Swallow whole. Renae Bernarda HERO, PA-C  Active   atorvastatin  (LIPITOR) 40 MG tablet [id number] Yes Take 1 tablet (40 mg total) by mouth daily. Revankar, Jennifer SAUNDERS, MD  Active Pharmacy Records, Care Giver  Cholecalciferol  (VITAMIN D -3) 125 MCG (5000 UT) TABS [id number] Yes Take 5,000 Units by  mouth daily. [provider]  Active Pharmacy Records, Care Giver  docusate sodium  (COLACE) 100 MG capsule [id number] Yes Take 1 capsule (100 mg total) by mouth 2 (two) times daily as needed for moderate constipation. Dino Antu, MD  Active   ferrous sulfate  325 (65 FE) MG EC tablet [id number] Yes Take 1 tablet (325 mg total) by mouth daily with breakfast. Frann Mabel Mt, DO  Active Pharmacy Records, Care Giver  finasteride  (PROSCAR ) 5 MG tablet [id number] Yes Take 1 tablet (5 mg total) by mouth daily. Frann Mabel Mt, DO  Active Pharmacy Records, Care Giver  methocarbamol  (ROBAXIN ) 500 MG tablet [id number] Yes Take 1 tablet (500 mg total) by mouth every 6 (six) hours as needed for muscle spasms. Rashid, Farhan, MD  Active   midodrine  (PROAMATINE ) 5 MG tablet [id number] Yes Take 1 tablet (5 mg total) by mouth 2 (two) times daily with a meal. Raenelle Coria, MD  Active Care Giver, Pharmacy Records  Multiple Vitamin (MULTIVITAMIN WITH MINERALS) TABS tablet [id number] Yes Take 1 tablet by mouth daily. Willette Adriana LABOR, MD  Active Pharmacy Records, Care Giver  pantoprazole  (PROTONIX ) 40 MG tablet [id number] Yes Take 1 tablet (40 mg total) by mouth daily. Frann Mabel Mt, DO  Active Pharmacy Records, Care Giver  tamsulosin  (FLOMAX ) 0.4 MG CAPS capsule [id number] Yes Take 0.4 mg by mouth daily. [provider]  Active Care Giver, Pharmacy Records  Med List Note Geri, Jon HERO, CPhT 05/04/24 1404): [place] [facility] [phone]) handles medications.             Home Care and Equipment/Supplies: Were Home Health Services Ordered?: NA Any new equipment or medical supplies ordered?: NA  Functional Questionnaire: Do you need assistance with bathing/showering or dressing?: Yes Do you need assistance with meal preparation?: Yes Do you need assistance with eating?: No Do you have difficulty maintaining continence: Yes Do you need assistance with  getting out of bed/getting out of a chair/moving?: No Do you have difficulty managing or taking your medications?: Yes  Follow up appointments reviewed: PCP Follow-up appointment confirmed?: No (declinced) MD Provider Line Number:[phone] Given: No Specialist Hospital Follow-up appointment confirmed?: NA Do you need transportation to your follow-up appointment?: No Do you understand care options if your condition(s) worsen?: Yes-patient verbalized understanding    SIGNATURE Julian Lemmings, LPN [facility] Nurse Health Advisor Direct Dial [phone]

## 2024-06-10 ENCOUNTER — Encounter (HOSPITAL_COMMUNITY): Payer: Self-pay

## 2024-06-10 ENCOUNTER — Other Ambulatory Visit: Payer: Self-pay

## 2024-06-10 ENCOUNTER — Emergency Department (HOSPITAL_COMMUNITY)
Admission: EM | Admit: 2024-06-10 | Discharge: 2024-06-10 | Disposition: A | Discharge status: Home / Self Care | Source: Skilled Nursing Facility | Attending: Emergency Medicine | Admitting: Emergency Medicine

## 2024-06-10 DIAGNOSIS — F0390 Unspecified dementia without behavioral disturbance: Secondary | ICD-10-CM | POA: Insufficient documentation

## 2024-06-10 DIAGNOSIS — I440 Atrioventricular block, first degree: Secondary | ICD-10-CM | POA: Insufficient documentation

## 2024-06-10 DIAGNOSIS — Z79899 Other long term (current) drug therapy: Secondary | ICD-10-CM | POA: Diagnosis not present

## 2024-06-10 DIAGNOSIS — I447 Left bundle-branch block, unspecified: Secondary | ICD-10-CM | POA: Diagnosis not present

## 2024-06-10 DIAGNOSIS — T50901A Poisoning by unspecified drugs, medicaments and biological substances, accidental (unintentional), initial encounter: Secondary | ICD-10-CM | POA: Diagnosis present

## 2024-06-10 DIAGNOSIS — R4182 Altered mental status, unspecified: Secondary | ICD-10-CM | POA: Insufficient documentation

## 2024-06-10 LAB — CBC WITH DIFFERENTIAL/PLATELET
Abs Immature Granulocytes: 0.01 K/uL (ref 0.00–0.07)
Basophils Absolute: 0.1 K/uL (ref 0.0–0.1)
Basophils Relative: 1 %
Eosinophils Absolute: 0.5 K/uL (ref 0.0–0.5)
Eosinophils Relative: 7 %
HCT: 34 % — ABNORMAL LOW (ref 39.0–52.0)
Hemoglobin: 10.3 g/dL — ABNORMAL LOW (ref 13.0–17.0)
Immature Granulocytes: 0 %
Lymphocytes Relative: 15 %
Lymphs Abs: 1 K/uL (ref 0.7–4.0)
MCH: 29.2 pg (ref 26.0–34.0)
MCHC: 30.3 g/dL (ref 30.0–36.0)
MCV: 96.3 fL (ref 80.0–100.0)
Monocytes Absolute: 0.6 K/uL (ref 0.1–1.0)
Monocytes Relative: 9 %
Neutro Abs: 4.4 K/uL (ref 1.7–7.7)
Neutrophils Relative %: 68 %
Platelets: 235 K/uL (ref 150–400)
RBC: 3.53 MIL/uL — ABNORMAL LOW (ref 4.22–5.81)
RDW: 15.9 % — ABNORMAL HIGH (ref 11.5–15.5)
WBC: 6.5 K/uL (ref 4.0–10.5)
nRBC: 0 % (ref 0.0–0.2)

## 2024-06-10 LAB — CBG MONITORING, ED
Glucose-Capillary: 105 mg/dL — ABNORMAL HIGH (ref 70–99)
Glucose-Capillary: 89 mg/dL (ref 70–99)

## 2024-06-10 LAB — URINALYSIS, ROUTINE W REFLEX MICROSCOPIC
Bacteria, UA: NONE SEEN
Bilirubin Urine: NEGATIVE
Glucose, UA: >=500 mg/dL — AB
Hgb urine dipstick: NEGATIVE
Ketones, ur: NEGATIVE mg/dL
Leukocytes,Ua: NEGATIVE
Nitrite: NEGATIVE
Protein, ur: NEGATIVE mg/dL
Specific Gravity, Urine: 1.012 (ref 1.005–1.030)
pH: 6 (ref 5.0–8.0)

## 2024-06-10 LAB — COMPREHENSIVE METABOLIC PANEL WITH GFR
ALT: 12 U/L (ref 0–44)
AST: 22 U/L (ref 15–41)
Albumin: 3.7 g/dL (ref 3.5–5.0)
Alkaline Phosphatase: 119 U/L (ref 38–126)
Anion gap: 13 (ref 5–15)
BUN: 19 mg/dL (ref 8–23)
CO2: 23 mmol/L (ref 22–32)
Calcium: 9.3 mg/dL (ref 8.9–10.3)
Chloride: 106 mmol/L (ref 98–111)
Creatinine, Ser: 0.86 mg/dL (ref 0.61–1.24)
GFR, Estimated: >60 mL/min (ref >60)
Glucose, Bld: 98 mg/dL (ref 70–99)
Potassium: 4.1 mmol/L (ref 3.5–5.1)
Sodium: 141 mmol/L (ref 135–145)
Total Bilirubin: 0.3 mg/dL (ref 0.0–1.2)
Total Protein: 6.2 g/dL — ABNORMAL LOW (ref 6.5–8.1)

## 2024-06-10 LAB — PROTIME-INR
INR: 1.1 (ref 0.8–1.2)
Prothrombin Time: 14.4 s (ref 11.4–15.2)

## 2024-06-10 MED ORDER — SODIUM CHLORIDE 0.9 % IV BOLUS
500.0000 mL | Freq: Once | INTRAVENOUS | Status: AC
  Administered 2024-06-10: 500 mL via INTRAVENOUS

## 2024-06-10 NOTE — ED Notes (Addendum)
 Poison control called. Monitor for 6 hrs. Eating and drinking ok, monitor ECG

## 2024-06-10 NOTE — ED Triage Notes (Signed)
 Pt bib EMS from [address] on [address]. Accidental overdose and confusion x2 days. Pt accidentally took his wife's medications instead of his own medications this morning. Xanax Carvedilol, sertraline, losartan, multi vitamin, warfarin, Pantoprazole , metformin, jardiance. Staff thinks possible UTI for confusion. No other symptoms. BP 130/80 HR 80 pace rhythm 98% RA CBG 143. A&Ox4. Recent hip replacement non ambulatory currently.

## 2024-06-10 NOTE — Discharge Instructions (Signed)
 Make sure he can eat a dinner tonight.  And follow-up as needed

## 2024-06-10 NOTE — ED Notes (Signed)
 [place] poison control called to request update. Denise at poison control made aware that this patient had been discharged by the ED provider. [place] poison control closed this case.

## 2024-06-10 NOTE — ED Notes (Signed)
 IV with blood draw attempted x2 without success

## 2024-06-10 NOTE — ED Provider Notes (Signed)
 [place] EMERGENCY DEPARTMENT AT [facility] Provider Note   CSN: [id number] Arrival date & time: 06/10/24  1019     Patient presents with: Altered Mental Status and Drug Overdose   Oscar [name] is a 89 y.o. male.   Patient accidentally took his wife's medicines.  He has a history of dementia.  Patient is acting like his normal self according to his daughter  The history is provided by the patient and a relative. No language interpreter was used.  Altered Mental Status Presenting symptoms: behavior changes   Severity:  Moderate Most recent episode:  More than 2 days ago Episode history:  Continuous Timing:  Constant Progression:  Unchanged Chronicity:  Chronic Context: not alcohol  use   Associated symptoms: no abdominal pain, no hallucinations, no headaches, no rash and no seizures   Drug Overdose Pertinent negatives include no chest pain, no abdominal pain and no headaches.       Prior to Admission medications   Medication Sig Start Date End Date Taking? Authorizing Provider  acetaminophen  (TYLENOL ) 500 MG tablet Take 2 tablets (1,000 mg total) by mouth every 8 (eight) hours. Patient taking differently: Take 1,000 mg by mouth every 8 (eight) hours as needed for mild pain (pain score 1-3) or moderate pain (pain score 4-6). 12/17/23   Shahmehdi, Adriana LABOR, MD  atorvastatin  (LIPITOR) 40 MG tablet Take 1 tablet (40 mg total) by mouth daily. 11/27/20   Revankar, Jennifer SAUNDERS, MD  Cholecalciferol  (VITAMIN D -3) 125 MCG (5000 UT) TABS Take 5,000 Units by mouth daily.    [provider]  docusate sodium  (COLACE) 100 MG capsule Take 1 capsule (100 mg total) by mouth 2 (two) times daily as needed for moderate constipation. 05/07/24   Dino Antu, MD  ferrous sulfate  325 (65 FE) MG EC tablet Take 1 tablet (325 mg total) by mouth daily with breakfast. 01/20/24   Frann, Mabel Mt, DO  finasteride  (PROSCAR ) 5 MG tablet Take 1 tablet (5 mg total) by mouth daily. 03/23/24    Frann Mabel Mt, DO  methocarbamol  (ROBAXIN ) 500 MG tablet Take 1 tablet (500 mg total) by mouth every 6 (six) hours as needed for muscle spasms. 05/07/24   Rashid, Farhan, MD  midodrine  (PROAMATINE ) 5 MG tablet Take 1 tablet (5 mg total) by mouth 2 (two) times daily with a meal. 04/02/24   Raenelle Coria, MD  Multiple Vitamin (MULTIVITAMIN WITH MINERALS) TABS tablet Take 1 tablet by mouth daily. 12/17/23   Shahmehdi, Adriana LABOR, MD  pantoprazole  (PROTONIX ) 40 MG tablet Take 1 tablet (40 mg total) by mouth daily. 11/19/23   Frann Mabel Mt, DO  tamsulosin  (FLOMAX ) 0.4 MG CAPS capsule Take 0.4 mg by mouth daily.    [provider]    Allergies: Lasix  [furosemide ]    Review of Systems  Constitutional:  Negative for appetite change and fatigue.  HENT:  Negative for congestion, ear discharge and sinus pressure.   Eyes:  Negative for discharge.  Respiratory:  Negative for cough.   Cardiovascular:  Negative for chest pain.  Gastrointestinal:  Negative for abdominal pain and diarrhea.  Genitourinary:  Negative for frequency and hematuria.  Musculoskeletal:  Negative for back pain.  Skin:  Negative for rash.  Neurological:  Negative for seizures and headaches.  Psychiatric/Behavioral:  Negative for hallucinations.     Updated Vital Signs BP (!) 159/76   Pulse 66   Temp (!) 97.4 F (36.3 C) (Oral)   Resp 19   Ht 5' 8 (  1.727 m)   Wt 62.8 kg   SpO2 100%   BMI 21.05 kg/m   Physical Exam Vitals and nursing note reviewed.  Constitutional:      Appearance: He is well-developed.  HENT:     Head: Normocephalic.     Nose: Nose normal.  Eyes:     General: No scleral icterus.    Conjunctiva/sclera: Conjunctivae normal.  Neck:     Thyroid : No thyromegaly.  Cardiovascular:     Rate and Rhythm: Normal rate and regular rhythm.     Heart sounds: No murmur heard.    No friction rub. No gallop.  Pulmonary:     Breath sounds: No stridor. No wheezing or rales.  Chest:      Chest wall: No tenderness.  Abdominal:     General: There is no distension.     Tenderness: There is no abdominal tenderness. There is no rebound.  Musculoskeletal:        General: Normal range of motion.     Cervical back: Neck supple.  Lymphadenopathy:     Cervical: No cervical adenopathy.  Skin:    Findings: No erythema or rash.  Neurological:     Mental Status: He is alert.     Motor: No abnormal muscle tone.     Coordination: Coordination normal.     Comments: Oriented to person only  Psychiatric:        Behavior: Behavior normal.     (all labs ordered are listed, but only abnormal results are displayed) Labs Reviewed  CBC WITH DIFFERENTIAL/PLATELET - Abnormal; Notable for the following components:      Result Value   RBC 3.53 (*)    Hemoglobin 10.3 (*)    HCT 34.0 (*)    RDW 15.9 (*)    All other components within normal limits  COMPREHENSIVE METABOLIC PANEL WITH GFR - Abnormal; Notable for the following components:   Total Protein 6.2 (*)    All other components within normal limits  URINALYSIS, ROUTINE W REFLEX MICROSCOPIC - Abnormal; Notable for the following components:   Glucose, UA >=500 (*)    All other components within normal limits  CBG MONITORING, ED - Abnormal; Notable for the following components:   Glucose-Capillary 105 (*)    All other components within normal limits  PROTIME-INR  CBG MONITORING, ED    EKG: EKG Interpretation Date/Time:  Thursday June 10 2024 11:25:52 EDT Ventricular Rate:  66 PR Interval:  219 QRS Duration:  133 QT Interval:  457 QTC Calculation: 479 R Axis:   21  Text Interpretation: Sinus rhythm Borderline prolonged PR interval Consider left atrial enlargement Left bundle branch block Artifact in lead(s) I II III aVR aVL aVF and baseline wander in lead(s) I III aVL V4 V5 V6 Confirmed by Suzette Pac [phone]) on 06/10/2024 4:01:55 PM  Radiology: No results found.   Procedures   Medications Ordered in the ED   sodium chloride  0.9 % bolus 500 mL (0 mLs Intravenous Stopped 06/10/24 1707)   CRITICAL CARE Performed by: Pac Suzette Total critical care time: 40 minutes Critical care time was exclusive of separately billable procedures and treating other patients. Critical care was necessary to treat or prevent imminent or life-threatening deterioration. Critical care was time spent personally by me on the following activities: development of treatment plan with patient and/or surrogate as well as nursing, discussions with consultants, evaluation of patient's response to treatment, examination of patient, obtaining history from patient or surrogate, ordering and performing treatments  and interventions, ordering and review of laboratory studies, ordering and review of radiographic studies, pulse oximetry and re-evaluation of patient's condition.   Poison control was called.  Patient was observed for 6 hours                                 Medical Decision Making Amount and/or Complexity of Data Reviewed Labs: ordered. ECG/medicine tests: ordered.   Accidental poisoning.  Patient was stable after 6 hours and will follow-up as needed     Final diagnoses:  Accidental overdose, initial encounter    ED Discharge Orders     None          Suzette Pac, MD 06/10/24 1754

## 2024-06-10 NOTE — ED Notes (Signed)
 Pt was given a tray and something to drink

## 2024-06-10 NOTE — ED Notes (Signed)
 Collected urine specimen and sent to the lab

## 2024-06-16 ENCOUNTER — Ambulatory Visit (INDEPENDENT_AMBULATORY_CARE_PROVIDER_SITE_OTHER): Admitting: Family Medicine

## 2024-06-16 ENCOUNTER — Encounter: Payer: Self-pay | Admitting: Family Medicine

## 2024-06-16 VITALS — BP 138/80 | HR 86 | Temp 98.0°F | Resp 18 | Ht 68.0 in | Wt 137.4 lb

## 2024-06-16 DIAGNOSIS — I951 Orthostatic hypotension: Secondary | ICD-10-CM

## 2024-06-16 DIAGNOSIS — N401 Enlarged prostate with lower urinary tract symptoms: Secondary | ICD-10-CM

## 2024-06-16 DIAGNOSIS — R351 Nocturia: Secondary | ICD-10-CM

## 2024-06-16 DIAGNOSIS — Z111 Encounter for screening for respiratory tuberculosis: Secondary | ICD-10-CM | POA: Diagnosis not present

## 2024-06-16 HISTORY — DX: Orthostatic hypotension: I95.1

## 2024-06-16 MED ORDER — MIDODRINE HCL 10 MG PO TABS
10.0000 mg | ORAL_TABLET | Freq: Two times a day (BID) | ORAL | 1 refills | Status: DC
Start: 2024-06-16 — End: 2024-07-13

## 2024-06-16 MED ORDER — FINASTERIDE 5 MG PO TABS: 5.0000 mg | ORAL_TABLET | Freq: Every day | ORAL | 1 refills | Status: AC

## 2024-06-16 NOTE — Patient Instructions (Signed)
Give [place] 2-3 business days to get the results of your labs back.   Try to drink 55-60 oz of water daily outside of exercise.  Let us know if you need anything.

## 2024-06-16 NOTE — Progress Notes (Signed)
 Chief Complaint  Patient presents with   Hip Pain    Left    Fall    Subjective: Patient is a 89 y.o. male here for f/u.  Here with a home aide who helps with the history.  Patient fell and broke his hip almost 2 months ago.  He required surgery and had his follow-up appointment yesterday.  Imaging was reassuring.  He is working with physical therapy.  The surgeon is not concerned and is pleased with his progress.  The patient has a history of BPH with nocturia.  He goes to the restroom 2-3 times in the middle of the night.  No pain, bleeding, discharge.  He is unsure if he is taking finasteride  5 mg daily.  Patient has a history of orthostasis.  He gets lightheaded for a few moments when he stands up around 90 percent of the time.  He does not always stay well-hydrated.  He is taking midodrine  5 mg twice daily.  Compliant, no adverse effects.  The patient and his family are investigating assisted living.  He has intermittent incontinence of both bowel and bladder.  No lingering ulcerations or wounds.  Memory is not as sharp as it used to be but he does not need dementia care.  He is able to participate in social settings.  No special dysphagia diets.  He does not wander.  Past Medical History:  Diagnosis Date   Abnormal nuclear stress test    Abnormal results of thyroid  function studies 01/19/2014   Formatting of this note might be different from the original. August 2016  TSH 4.01   Apneic episode 11/29/2020   Ascending aorta dilatation 12/07/2020   Atrial tachycardia, paroxysmal 08/05/2019   Bunion of great toe of right foot 09/18/2016   Centrilobular emphysema (HCC) 11/26/2020   Cervical stenosis of spine    CHB (complete heart block) (HCC) 09/28/2020   Coronary artery disease involving native coronary artery of native heart without angina pectoris 08/05/2019   CVA (cerebral vascular accident) (HCC) 03/12/2020   Encounter for general adult medical examination without abnormal  findings 05/20/2016   Essential hypertension 03/12/2020   Exertional dyspnea 01/18/2020   Gastro-esophageal reflux disease with esophagitis 09/28/2010   GERD (gastroesophageal reflux disease)    History of bladder cancer 03/14/2014   History of concussion    X2   NO RESIDUALS   History of peptic ulcer 1970'S   Hypercholesterolemia    Hypothyroidism due to Hashimoto's thyroiditis 07/23/2019   LBBB (left bundle branch block)    Macular degeneration of left eye    Mixed hyperlipidemia 11/24/2013   Formatting of this note might be different from the original. History of TIA.  The goal is to have your total cholesterol < 200, the HDL (good cholesterol) >40, and the LDL (bad cholesterol) <100. It is recomended that you follow a good low fat diet and exercise for 30 minutes 3-4 times a week.   Personal history of tobacco use, presenting hazards to health 01/12/2014   Presence of permanent cardiac pacemaker 11/15/2020   S/P bilateral cataract extraction 09/18/2016   Serum reaction due to vaccination 09/27/2011   Formatting of this note might be different from the original. IMPRESSION: seen on Wednesday for reaction to right arm after pneumovax, had redness, warmth , tenderness and edema of right upper arm which is now resolved. monitor area. use warm compresses prn. call prn. discussed will not need another pneumovax, he will consider zostavax in future.   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   Status post placement of implantable loop recorder 11/08/2014   TIA (transient ischemic attack) 04/09/2014    Objective: BP 138/80   Pulse 86   Temp 98 F (36.7 C)   Resp 18   Ht 5' 8 (1.727 m)   Wt 137 lb 6.4 oz (62.3 kg)   SpO2 98%   BMI 20.89 kg/m  General: Awake, appears stated age Neuro: Gait is slow/cautious with a walker Lungs: No accessory muscle use Psych: Age appropriate judgment and insight, normal affect and mood  Assessment and Plan: BPH associated with  nocturia - Plan: finasteride  (PROSCAR ) 5 MG tablet  Screening-pulmonary TB - Plan: QuantiFERON-TB Gold Plus  Orthostasis - Plan: midodrine  (PROAMATINE ) 10 MG tablet  Chronic, relatively stable.  Continue finasteride  5 mg daily. Screen above. Chronic, not controlled.  Increase midodrine  from 5 mg twice daily to 10 mg twice daily.  Stay hydrated.  Get up slowly.  Follow-up in 1 month. The patient and his aide voiced understanding and agreement to the plan.  Mabel [place], DO 06/16/24  12:09 PM

## 2024-06-17 ENCOUNTER — Telehealth: Payer: Self-pay

## 2024-06-17 ENCOUNTER — Encounter: Payer: Self-pay | Admitting: Family Medicine

## 2024-06-17 DIAGNOSIS — G3184 Mild cognitive impairment, so stated: Secondary | ICD-10-CM | POA: Insufficient documentation

## 2024-06-17 NOTE — Telephone Encounter (Signed)
 Copied from CRM [id number]. Topic: General - Other >> Jun 17, 2024 10:10 AM Willma SAUNDERS wrote: Reason for CRM: Eva from [facility] calling stating that the patient is moving into their center and are scheduled to come into the office today for an assessment. States she needs a HMP on the patient before the appointment.  Eva can be reached at  [phone]

## 2024-06-17 NOTE — Telephone Encounter (Signed)
 Spoke w/ Eva- she is actually on her way here to pick up history and physical on Pt. Informed her I'd print last OV note and immunization record and place at front desk for her. Miriam verbalized understanding.

## 2024-06-18 LAB — QUANTIFERON-TB GOLD PLUS
Mitogen-NIL: 1.35 [IU]/mL
NIL: 0.01 [IU]/mL
QuantiFERON-TB Gold Plus: NEGATIVE
TB1-NIL: 0 [IU]/mL
TB2-NIL: 0 [IU]/mL

## 2024-06-19 ENCOUNTER — Ambulatory Visit: Payer: Self-pay | Admitting: Family Medicine

## 2024-06-21 NOTE — Telephone Encounter (Signed)
 Copied from CRM [id number]. Topic: General - Other >> Jun 21, 2024  1:28 PM [place] E wrote: Reason for CRM: Alfonso, patient's care manager, called in wanting a status of the [place] paperwork that was getting filled. She would also like a physical copy of the patient's TB results printed out and would like to pick that up when the [place] forms are ready. Callback number for Alfonso is [phone].

## 2024-06-29 ENCOUNTER — Other Ambulatory Visit: Payer: Self-pay

## 2024-06-29 ENCOUNTER — Other Ambulatory Visit: Payer: Self-pay | Admitting: Family Medicine

## 2024-06-29 MED ORDER — ATORVASTATIN CALCIUM 40 MG PO TABS: 40.0000 mg | ORAL_TABLET | Freq: Every day | ORAL | 0 refills | Status: AC

## 2024-07-09 NOTE — Progress Notes (Signed)
 Oscar [name]

## 2024-07-13 ENCOUNTER — Encounter: Payer: Self-pay | Admitting: Family Medicine

## 2024-07-13 ENCOUNTER — Ambulatory Visit (INDEPENDENT_AMBULATORY_CARE_PROVIDER_SITE_OTHER): Admitting: Family Medicine

## 2024-07-13 DIAGNOSIS — I951 Orthostatic hypotension: Secondary | ICD-10-CM

## 2024-07-13 MED ORDER — MIDODRINE HCL 10 MG PO TABS: 10.0000 mg | ORAL_TABLET | Freq: Two times a day (BID) | ORAL | 1 refills | Status: AC

## 2024-07-13 NOTE — Patient Instructions (Signed)
Stay hydrated.  Let us know if you need anything.  

## 2024-07-13 NOTE — Progress Notes (Signed)
 Chief Complaint  Patient presents with   Follow-up    Follow Up    Subjective: Patient is a 89 y.o. male here for f/u. Here w aide who helps w hx.   Midodrine  was increased from 5 mg bid to 10 mg bid. Some improvement as he complains about it less. Compliant, no AE's.   Past Medical History:  Diagnosis Date   Abnormal nuclear stress test    Abnormal results of thyroid  function studies 01/19/2014   Formatting of this note might be different from the original. August 2016  TSH 4.01   Ascending aorta dilatation 12/07/2020   Atrial tachycardia, paroxysmal 08/05/2019   Balance problem    Bunion of great toe of right foot 09/18/2016   Centrilobular emphysema (HCC) 11/26/2020   Cervical stenosis of spine    CHB (complete heart block) (HCC) 09/28/2020   Coronary artery disease involving native coronary artery of native heart without angina pectoris 08/05/2019   CVA (cerebral vascular accident) (HCC) 03/12/2020   Essential hypertension 03/12/2020   Gastro-esophageal reflux disease with esophagitis 09/28/2010   History of bladder cancer 03/14/2014   History of concussion    X2   NO RESIDUALS   History of peptic ulcer 1970'S   Hypothyroidism due to Hashimoto's thyroiditis 07/23/2019   LBBB (left bundle branch block)    Macular degeneration of left eye    Mild cognitive impairment    Mixed hyperlipidemia 11/24/2013   Formatting of this note might be different from the original. History of TIA.  The goal is to have your total cholesterol < 200, the HDL (good cholesterol) >40, and the LDL (bad cholesterol) <100. It is recomended that you follow a good low fat diet and exercise for 30 minutes 3-4 times a week.   Personal history of tobacco use, presenting hazards to health 01/12/2014   Presence of permanent cardiac pacemaker 11/15/2020   S/P bilateral cataract extraction 09/18/2016   Serum reaction due to vaccination 09/27/2011   Formatting of this note might be different from the original.  IMPRESSION: seen on Wednesday for reaction to right arm after pneumovax, had redness, warmth , tenderness and edema of right upper arm which is now resolved. monitor area. use warm compresses prn. call prn. discussed will not need another pneumovax, he will consider zostavax in future.   Status post placement of implantable loop recorder 11/08/2014   TIA (transient ischemic attack) 04/09/2014    Objective: BP 124/72 (BP Location: Left Arm, Patient Position: Sitting)   Pulse 76   Temp 97.8 F (36.6 C) (Oral)   Resp 16   Ht 5' 8 (1.727 m)   Wt 139 lb (63 kg)   SpO2 95%   BMI 21.13 kg/m  General: Awake, appears stated age Heart: RRR, no LE edema Lungs: CTAB, no rales, wheezes or rhonchi. No accessory muscle use Psych: normal affect and mood  Assessment and Plan: Orthostasis - Plan: midodrine  (PROAMATINE ) 10 MG tablet  Chronic, stable. Cont midodrine  10 mg bid. Stay hydrated.  F/u in 5 mo.  The patient and aide voiced understanding and agreement to the plan.  Mabel [place], DO 07/13/24  10:56 AM

## 2024-08-12 ENCOUNTER — Telehealth: Payer: Self-pay | Admitting: Family Medicine

## 2024-08-12 NOTE — Telephone Encounter (Signed)
 Copied from CRM [id number]. Topic: Medicare AWV >> Aug 12, 2024 11:31 AM Nathanel DEL wrote: Called 08/12/2024 to sched AWV - NO VOICEMAIL  Nathanel Paschal; Care Guide Ambulatory Clinical Support [place] [facility] Direct Dial: [phone]

## 2024-08-17 ENCOUNTER — Ambulatory Visit: Admitting: Family Medicine

## 2024-08-17 ENCOUNTER — Encounter: Payer: Self-pay | Admitting: Family Medicine

## 2024-08-17 VITALS — BP 124/78 | HR 82 | Temp 98.0°F | Resp 16 | Ht 68.0 in | Wt 144.4 lb

## 2024-08-17 DIAGNOSIS — M545 Low back pain, unspecified: Secondary | ICD-10-CM | POA: Diagnosis not present

## 2024-08-17 NOTE — Patient Instructions (Addendum)

## 2024-08-17 NOTE — Progress Notes (Signed)
 Musculoskeletal Exam  Patient: Oscar [name] DOB: 1931-09-06  DOS: 08/17/2024  SUBJECTIVE:  Chief Complaint:   Chief Complaint  Patient presents with   Back Pain    Back Pain     Oscar [name] is a 89 y.o.  male for evaluation and treatment of back pain. Here w an aide who helps w the hx.   Onset:  1 week ago. No inj or change in activity.   Location: lower back b/l Character:  aching  Progression of issue:  has worsened Associated symptoms: difficulty bending over Denies bowel/bladder incontinence or weakness Treatment: to date has been ice, massage, Tylenol , heat, Voltaren gel.   Neurovascular symptoms: no  Past Medical History:  Diagnosis Date   Abnormal nuclear stress test    Abnormal results of thyroid  function studies 01/19/2014   Formatting of this note might be different from the original. August 2016  TSH 4.01   Ascending aorta dilatation 12/07/2020   Atrial tachycardia, paroxysmal 08/05/2019   Balance problem    Bunion of great toe of right foot 09/18/2016   Centrilobular emphysema (HCC) 11/26/2020   Cervical stenosis of spine    CHB (complete heart block) (HCC) 09/28/2020   Coronary artery disease involving native coronary artery of native heart without angina pectoris 08/05/2019   CVA (cerebral vascular accident) (HCC) 03/12/2020   Essential hypertension 03/12/2020   Gastro-esophageal reflux disease with esophagitis 09/28/2010   History of bladder cancer 03/14/2014   History of concussion    X2   NO RESIDUALS   History of peptic ulcer 1970'S   Hypothyroidism due to Hashimoto's thyroiditis 07/23/2019   LBBB (left bundle branch block)    Macular degeneration of left eye    Mild cognitive impairment    Mixed hyperlipidemia 11/24/2013   Formatting of this note might be different from the original. History of TIA.  The goal is to have your total cholesterol < 200, the HDL (good cholesterol) >40, and the LDL (bad cholesterol) <100. It is recomended that you  follow a good low fat diet and exercise for 30 minutes 3-4 times a week.   Personal history of tobacco use, presenting hazards to health 01/12/2014   Presence of permanent cardiac pacemaker 11/15/2020   S/P bilateral cataract extraction 09/18/2016   Serum reaction due to vaccination 09/27/2011   Formatting of this note might be different from the original. IMPRESSION: seen on Wednesday for reaction to right arm after pneumovax, had redness, warmth , tenderness and edema of right upper arm which is now resolved. monitor area. use warm compresses prn. call prn. discussed will not need another pneumovax, he will consider zostavax in future.   Status post placement of implantable loop recorder 11/08/2014   TIA (transient ischemic attack) 04/09/2014    Objective:  VITAL SIGNS: BP 124/78 (BP Location: Left Arm, Patient Position: Sitting)   Pulse 82   Temp 98 F (36.7 C) (Oral)   Resp 16   Ht 5' 8 (1.727 m)   Wt 144 lb 6.4 oz (65.5 kg)   SpO2 97%   BMI 21.96 kg/m  Constitutional: Well formed, well developed. No acute distress. HENT: Normocephalic, atraumatic.  Thorax & Lungs:  No accessory muscle use Musculoskeletal: low back.   Tenderness to palpation: Yes over the lumbar paraspinal musculature and erector spinae muscle group bilaterally Deformity: no Ecchymosis: no Straight leg test: negative for Poor hamstring flexibility b/l. Neurologic: Normal sensory function. No focal deficits noted. DTR's equal and symmetric in LE's. No clonus.  Gait is cautious and slow. Psychiatric: Limited judgment and insight.  Assessment:  Acute bilateral low back pain, unspecified whether sciatica present - Plan: Ambulatory referral to Physical Therapy  Plan: Stretches/exercises, heat, ice, Tylenol , refer to physical therapy at his independent living facility. F/u prn. The patient and his aide voiced understanding and agreement to the plan.   Mabel [place], DO 08/17/24  11:27 AM

## 2024-10-08 ENCOUNTER — Telehealth: Payer: Self-pay

## 2024-10-08 NOTE — Telephone Encounter (Signed)
 PA denied.   FINASTERIDE  TAB 5MG  is denied under your Medicare Advantage (MA) plan, which does not cover outpatient prescription drugs that may be eligible for coverage under Part D. Drug coverage provided by your plan is limited to drugs that would be covered under Part B of Original Medicare. Please refer to your Evidence of Coverage (EOC) for further information on the types of drugs that are covered. You may have a separate commercial pharmacy benefit or Medicare Part D benefit managed by another carrier. If you have prescription drug coverage with another carrier, please contact them directly for information about how to access your prescription drug benefit. Reviewed by: R.Ph

## 2024-10-08 NOTE — Telephone Encounter (Signed)
 PA initiated via Covermymeds; KEY: [id number]. Awaiting determination.

## 2024-10-11 DIAGNOSIS — R2689 Other abnormalities of gait and mobility: Secondary | ICD-10-CM | POA: Insufficient documentation

## 2024-10-11 DIAGNOSIS — H3530 Unspecified macular degeneration: Secondary | ICD-10-CM | POA: Insufficient documentation

## 2024-10-19 ENCOUNTER — Encounter: Payer: Self-pay | Admitting: Cardiology

## 2024-10-19 ENCOUNTER — Ambulatory Visit: Attending: Cardiology | Admitting: Cardiology

## 2024-10-19 VITALS — BP 126/73 | HR 64 | Ht 68.0 in | Wt 146.8 lb

## 2024-10-19 DIAGNOSIS — E782 Mixed hyperlipidemia: Secondary | ICD-10-CM | POA: Diagnosis not present

## 2024-10-19 DIAGNOSIS — I10 Essential (primary) hypertension: Secondary | ICD-10-CM | POA: Diagnosis not present

## 2024-10-19 DIAGNOSIS — Z950 Presence of cardiac pacemaker: Secondary | ICD-10-CM

## 2024-10-19 DIAGNOSIS — I2510 Atherosclerotic heart disease of native coronary artery without angina pectoris: Secondary | ICD-10-CM

## 2024-10-19 DIAGNOSIS — I77810 Thoracic aortic ectasia: Secondary | ICD-10-CM | POA: Diagnosis not present

## 2024-10-19 NOTE — Progress Notes (Signed)
 " Cardiology Office Note:    Date:  10/19/2024   ID:  [name], DOB 02-12-1931, MRN [id number]  PCP:  Frann Mabel Mt, DO  Cardiologist:  Jennifer JONELLE Crape, MD   Referring MD: Frann Mabel Mt, DO    ASSESSMENT:    1. Ascending aorta dilatation   2. Atherosclerosis of native coronary artery of native heart without angina pectoris   3. Essential hypertension   4. Presence of permanent cardiac pacemaker   5. Mixed hyperlipidemia    PLAN:    In order of problems listed above:  Coronary artery disease: secondary prevention stressed with the patient.  Importance of compliance with diet medication stressed and patient verbalized standing.  He was advised to ambulate to the best of his ability. Essential hypertension: Blood pressure is stable and diet was emphasized.  Lifestyle modification urged. Mixed dyslipidemia: On lipid-lowering medications followed by primary care. History of atrial fibrillation: Not on anticoagulation because of unstable gait and fall propensity's.  He has fallen multiple times in the past. Permanent pacemaker: We will connect him with our electrophysiology colleagues for follow-up of pacemaker. Ascending aortic dilatation: He prefers no follow-up in view of multiple comorbidities and advancing age.  I respect his wishes.  He is asymptomatic.  He understands benefits and risks. Patient will be seen in follow-up appointment in 6 months or earlier if the patient has any concerns.    Medication Adjustments/Labs and Tests Ordered: Current medicines are reviewed at length with the patient today.  Concerns regarding medicines are outlined above.  No orders of the defined types were placed in this encounter.  No orders of the defined types were placed in this encounter.    No chief complaint on file.    History of Present Illness:    Oscar [name] is a 89 y.o. male.  Patient has past medical history of coronary artery disease, ascending  aortic dilatation, essential hypertension, mixed dyslipidemia and permanent pacemaker for complete heart block.  He denies any problems at this time and takes care of activities of daily living.  No chest pain orthopnea or PND.  He ambulates with a walker.  At the time of my evaluation, the patient is alert awake oriented and in no distress.  Past Medical History:  Diagnosis Date   Abdominal pain 09/24/2022   Abnormal gait 12/17/2023   Abnormal nuclear stress test    Abnormal results of thyroid  function studies 01/19/2014   Formatting of this note might be different from the original. August 2016  TSH 4.01   Acute CVA (cerebrovascular accident) (HCC) 03/12/2020   Adult body mass index 26.0-26.9 06/07/2013   Angina pectoris 07/03/2022   Apneic episode 11/29/2020   Ascending aorta dilatation 12/07/2020   Atherosclerosis of coronary artery without angina pectoris 12/17/2023   Atrial tachycardia 01/24/2020   Atrial tachycardia, paroxysmal 08/05/2019   Balance problem    Benign prostatic hyperplasia with urinary obstruction 12/17/2023   Bilious vomiting without nausea 11/16/2022   Bladder tumor 01/04/2021   BPH associated with nocturia 11/19/2023   Bunion of great toe of right foot 09/18/2016   Cancer of bladder (HCC) 02/10/2013   Cardiac arrhythmia 04/12/2014   Centrilobular emphysema (HCC) 11/26/2020   Cervical myelopathy (HCC) 01/27/2018   Cervical stenosis of spine    CHB (complete heart block) (HCC) 09/28/2020   Chest pain 11/27/2022   Chronic heart failure with preserved ejection fraction (HFpEF) (HCC) 12/12/2023   Cognitive communication disorder 12/17/2023   Coronary artery disease  involving native coronary artery of native heart without angina pectoris 08/05/2019   CVA (cerebral vascular accident) (HCC) 03/12/2020   Dysphagia as late effect of cerebrovascular accident (CVA) 12/17/2023   E. coli infection    Early satiety 10/22/2022   Encounter for general adult medical  examination without abnormal findings 05/20/2016   Essential hypertension 03/12/2020   Exertional dyspnea 01/18/2020   Fall 12/17/2023   Gastro-esophageal reflux disease with esophagitis 09/28/2010   GERD (gastroesophageal reflux disease) 01/04/2021   Ground-level fall 03/28/2024   H/O right inguinal hernia repair 02/04/2023   Hematemesis 05/10/2022   History of appendectomy    History of bladder cancer 03/14/2014   History of concussion    X2   NO RESIDUALS   History of peptic ulcer 1970'S   Hospital discharge follow-up 03/28/2020   Hypercholesterolemia 10/09/2021   Hyperlipidemia 11/20/2017   Hyponatremia 11/13/2020   Hypothyroidism (acquired) 03/12/2020   Hypothyroidism due to Hashimoto's thyroiditis 07/23/2019   Irregular heart beat    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 History of TIA.  The goal is to have your total cholesterol < 200, the HDL (good cholesterol) >40, and the LDL (bad cholesterol) <100. It is recomended that you follow a good low fat diet and exercise for 30 minutes 3-4 times a week.   Muscle weakness 12/17/2023   Nausea 11/16/2022   Near syncope 05/16/2014   Normocytic anemia 12/12/2023   OAB (overactive bladder) 11/19/2023   Oropharyngeal dysphagia 12/17/2023   Orthostasis 06/16/2024   Personal history of tobacco use, presenting hazards to health 01/12/2014   Presence of permanent cardiac pacemaker 11/15/2020   Right inguinal hernia 12/24/2022   S/P bilateral cataract extraction 09/18/2016   Serum reaction due to vaccination 09/27/2011   Formatting of this note might be different from the original. IMPRESSION: seen on Wednesday for reaction to right arm after pneumovax, had redness, warmth , tenderness and edema of right upper arm which is now  resolved. monitor area. use warm compresses prn. call prn. discussed will not need another pneumovax, he will consider zostavax in future.   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  IMPRESSION: 8 2015 admitted for TIA -negative CT and MRI.  Echocardiogram was also completed which was normal.  Carotid Dopplers negative for significant stenosis.  Sleep study was normal.     Urinary tract obstruction 12/17/2023    Past Surgical History:  Procedure Laterality Date   ANTERIOR CERVICAL DECOMP/DISCECTOMY FUSION N/A 01/27/2018   Procedure: Anterior Cervical Discectomy Fusion - Cervical Four- Cervical Five;  Surgeon: Louis Shove, MD;  Location: [facility] OR;  Service: Neurosurgery;  Laterality: N/A;  Anterior Cervical Discectomy Fusion - Cervical Four- Cervical Five   APPENDECTOMY  1970   CARDIOVASCULAR STRESS TEST  03/22/09   CYSTOSCOPY WITH BIOPSY N/A 02/10/2013   Procedure: CYSTOSCOPY WITH COLD CUP BIOPSY/FULGERATION;  Surgeon: Alm GORMAN Fragmin, MD;  [facility];  Service: Urology;  Laterality: N/A;  ALSO FULGERATION    HIP  PINNING,CANNULATED Left 03/28/2024   Procedure: FIXATION, FEMUR, NECK, PERCUTANEOUS, USING SCREW;  Surgeon: Edna Toribio LABOR, MD;  Location: [facility] OR;  Service: Orthopedics;  Laterality: Left;   INGUINAL HERNIA REPAIR Right 07-14-2002   INTRAMEDULLARY (IM) NAIL INTERTROCHANTERIC Right 12/12/2023   Procedure: FIXATION, FRACTURE, INTERTROCHANTERIC, WITH INTRAMEDULLARY ROD;  Surgeon: Kendal Franky SQUIBB, MD;  Location: [facility] OR;  Service: Orthopedics;  Laterality: Right;   INTRAMEDULLARY (IM) NAIL INTERTROCHANTERIC Left 05/05/2024    Procedure: FIXATION, FRACTURE, INTERTROCHANTERIC, WITH INTRAMEDULLARY ROD;  Surgeon: Edna Toribio LABOR, MD;  Location: [facility] ORS;  Service: Orthopedics;  Laterality: Left;   LEFT HEART CATH AND CORONARY ANGIOGRAPHY N/A 07/13/2019   Procedure: LEFT HEART CATH AND CORONARY ANGIOGRAPHY;  Surgeon: Burnard Debby LABOR, MD;  Location: [facility] INVASIVE CV LAB;  Service: Cardiovascular;  Laterality: N/A;   LOOP RECORDER IMPLANT N/A 05/24/2014   Procedure: LOOP RECORDER IMPLANT;  Surgeon: Jerel Balding, MD;  Location: [facility] CATH LAB;  Service: Cardiovascular;  Laterality: N/A;   LOOP RECORDER REMOVAL N/A 10/02/2020   Procedure: LOOP RECORDER REMOVAL;  Surgeon: Balding Jerel, MD;  Location: [facility] INVAS[facility] CV LAB;  Service: Cardiovascular;  Laterality: N/A;   NECK SURGERY     PACEMAKER IMPLANT N/A 10/02/2020   Procedure: PACEMAKER IMPLANT;  Surgeon: Balding Jerel, MD;  Location: [facility] INVASIVE CV LAB;  Service: Cardiovascular;  Laterality: N/A;   TONSILLECTOMY     TRANSURETHRAL RESECTION OF BLADDER TUMOR  03-10-2008   TRANSURETHRAL RESECTION OF BLADDER TUMOR Bilateral 12/28/2020   Procedure: TRANSURETHRAL RESECTION OF BLADDER TUMOR BLADDER BIOPSY FULGARATION BILATERAL RETROGRADE PYELOGRAM(TURBT);  Surgeon: Cam Morene ORN, MD;  Location: [facility] ORS;  Service: Urology;  Laterality: Bilateral;   TUMOR EXCISION Left    Left arm    Current Medications: Active Medications[1]   Allergies:   Lasix  [furosemide ]   Social History   Socioeconomic History   Marital status: Married    Spouse name: Not on file   Number of children: Not on file   Years of education: Not on file   Highest education level: Not on file  Occupational History   Occupation: retired  Tobacco Use   Smoking status: Former    Current packs/day: 0.00    Average packs/day: 0.5 packs/day for 30.0 years (15.0 ttl pk-yrs)    Types: Cigarettes    Start date: 02/05/1949    Quit date: 02/06/1979    Years since quitting: 45.7   Smokeless tobacco: Never   Vaping Use   Vaping status: Never Used  Substance and Sexual Activity   Alcohol  use: Not Currently    Comment: occ   Drug use: No   Sexual activity: Not on file  Other Topics Concern   Not on file  Social History Narrative   Not on file   Social Drivers of Health   Tobacco Use: Medium Risk (10/19/2024)   Patient History    Smoking Tobacco Use: Former    Smokeless Tobacco Use: Never    Passive Exposure: Not on Actuary Strain: Not on file  Food Insecurity: No Food Insecurity (05/04/2024)   Epic    Worried About Programme Researcher, Broadcasting/film/video in the Last Year: Never true    Ran Out of Food in the Last Year: Never true  Transportation Needs: No Transportation Needs (05/04/2024)   Epic    Lack of Transportation (Medical): No    Lack of Transportation (Non-Medical): No  Physical Activity: Not on file  Stress: Not on file  Social Connections: Moderately Integrated (05/04/2024)  Social Advertising Account Executive    Frequency of Communication with Friends and Family: More than three times a week    Frequency of Social Gatherings with Friends and Family: More than three times a week    Attends Religious Services: More than 4 times per year    Active Member of [employer] or Organizations: No    Attends Banker Meetings: Never    Marital Status: Married  Depression (PHQ2-9): Low Risk (08/17/2024)   Depression (PHQ2-9)    PHQ-2 Score: 0  Alcohol  Screen: Not on file  Housing: Low Risk (05/04/2024)   Epic    Unable to Pay for Housing in the Last Year: No    Number of Times Moved in the Last Year: 0    Homeless in the Last Year: No  Utilities: Not At Risk (05/04/2024)   Epic    Threatened with loss of utilities: No  Health Literacy: Not on file     Family History: The patient's family history includes Diabetes in his brother; Heart failure in his brother and mother; Stroke in his father.  ROS:   Please see the history of present illness.    All other systems  reviewed and are negative.  EKGs/Labs/Other Studies Reviewed:    The following studies were reviewed today: I discussed my findings with the patient at length    Recent Labs: 03/31/2024: B Natriuretic Peptide 53.3 05/05/2024: Magnesium  2.0 06/10/2024: ALT 12; BUN 19; Creatinine, Ser 0.86; Hemoglobin 10.3; Platelets 235; Potassium 4.1; Sodium 141  Recent Lipid Panel    Component Value Date/Time   CHOL 136 05/14/2021 1135   TRIG 63 05/14/2021 1135   HDL 59 05/14/2021 1135   CHOLHDL 2.3 05/14/2021 1135   CHOLHDL 3.9 03/13/2020 0500   VLDL 19 03/13/2020 0500   LDLCALC 64 05/14/2021 1135    Physical Exam:    VS:  BP 126/73   Pulse 64   Ht 5' 8 (1.727 m)   Wt 146 lb 12.8 oz (66.6 kg)   SpO2 99%   BMI 22.32 kg/m     Wt Readings from Last 3 Encounters:  10/19/24 146 lb 12.8 oz (66.6 kg)  08/17/24 144 lb 6.4 oz (65.5 kg)  07/13/24 139 lb (63 kg)     GEN: Patient is in no acute distress HEENT: Normal NECK: No JVD; No carotid bruits LYMPHATICS: No lymphadenopathy CARDIAC: Hear sounds regular, 2/6 systolic murmur at the apex. RESPIRATORY:  Clear to auscultation without rales, wheezing or rhonchi  ABDOMEN: Soft, non-tender, non-distended MUSCULOSKELETAL:  No edema; No deformity  SKIN: Warm and dry NEUROLOGIC:  Alert and oriented x 3 PSYCHIATRIC:  Normal affect   Signed, Jennifer JONELLE Crape, MD  10/19/2024 1:23 PM     [facility]     [1]  Current Meds  Medication Sig   acetaminophen  (TYLENOL ) 500 MG tablet Take 2 tablets (1,000 mg total) by mouth every 8 (eight) hours. (Patient taking differently: Take 1,000 mg by mouth every 8 (eight) hours as needed for mild pain (pain score 1-3) or moderate pain (pain score 4-6).)   aspirin  EC 325 MG tablet Take 325 mg by mouth daily.   atorvastatin  (LIPITOR) 40 MG tablet Take 1 tablet (40 mg total) by mouth daily.   Cholecalciferol  (VITAMIN D -3) 125 MCG (5000 UT) TABS Take 5,000 Units by mouth daily.   docusate  sodium (COLACE) 100 MG capsule Take 1 capsule (100 mg total) by mouth 2 (two) times daily as needed for  moderate constipation.   ferrous sulfate  325 (65 FE) MG EC tablet Take 1 tablet (325 mg total) by mouth daily with breakfast.   finasteride  (PROSCAR ) 5 MG tablet Take 1 tablet (5 mg total) by mouth daily.   midodrine  (PROAMATINE ) 10 MG tablet Take 1 tablet (10 mg total) by mouth in the morning and at bedtime.   Multiple Vitamin (MULTIVITAMIN WITH MINERALS) TABS tablet Take 1 tablet by mouth daily.   pantoprazole  (PROTONIX ) 40 MG tablet Take 1 tablet (40 mg total) by mouth daily.   "

## 2024-10-19 NOTE — Patient Instructions (Signed)
 Medication Instructions:  Your physician recommends that you continue on your current medications as directed. Please refer to the Current Medication list given to you today.  *If you need a refill on your cardiac medications before your next appointment, please call your pharmacy*  Lab Work: None If you have labs (blood work) drawn today and your tests are completely normal, you will receive your results only by: MyChart Message (if you have MyChart) OR A paper copy in the mail If you have any lab test that is abnormal or we need to change your treatment, we will call you to review the results.  Testing/Procedures: None  Follow-Up: At [facility], you and your health needs are our priority.  As part of our continuing mission to provide you with exceptional heart care, our providers are all part of one team.  This team includes your primary Cardiologist (physician) and Advanced Practice Providers or APPs (Physician Assistants and Nurse Practitioners) who all work together to provide you with the care you need, when you need it.  Your next appointment:   9 month(s)  Provider:   Belva Crome, MD    We recommend signing up for the patient portal called "MyChart".  Sign up information is provided on this After Visit Summary.  MyChart is used to connect with patients for Virtual Visits (Telemedicine).  Patients are able to view lab/test results, encounter notes, upcoming appointments, etc.  Non-urgent messages can be sent to your provider as well.   To learn more about what you can do with MyChart, go to [email or web address].   Other Instructions None

## 2024-12-14 ENCOUNTER — Ambulatory Visit: Admitting: Family Medicine
